# Patient Record
Sex: Male | Born: 1945 | Race: White | Hispanic: No | Marital: Married | Smoking: Former smoker
Health system: Southern US, Community
[De-identification: ages and names within clinical notes are randomized; demographics above are authoritative.]

## PROBLEM LIST (undated history)

## (undated) DIAGNOSIS — I82B12 Acute embolism and thrombosis of left subclavian vein: Secondary | ICD-10-CM

## (undated) DIAGNOSIS — K559 Vascular disorder of intestine, unspecified: Secondary | ICD-10-CM

## (undated) DIAGNOSIS — A809 Acute poliomyelitis, unspecified: Secondary | ICD-10-CM

## (undated) DIAGNOSIS — I10 Essential (primary) hypertension: Secondary | ICD-10-CM

## (undated) DIAGNOSIS — C801 Malignant (primary) neoplasm, unspecified: Secondary | ICD-10-CM

## (undated) DIAGNOSIS — J449 Chronic obstructive pulmonary disease, unspecified: Secondary | ICD-10-CM

## (undated) DIAGNOSIS — E785 Hyperlipidemia, unspecified: Secondary | ICD-10-CM

## (undated) DIAGNOSIS — C3491 Malignant neoplasm of unspecified part of right bronchus or lung: Secondary | ICD-10-CM

## (undated) DIAGNOSIS — H409 Unspecified glaucoma: Secondary | ICD-10-CM

## (undated) DIAGNOSIS — G609 Hereditary and idiopathic neuropathy, unspecified: Secondary | ICD-10-CM

## (undated) DIAGNOSIS — G4730 Sleep apnea, unspecified: Secondary | ICD-10-CM

## (undated) HISTORY — DX: Malignant (primary) neoplasm, unspecified: C80.1

## (undated) HISTORY — DX: Unspecified glaucoma: H40.9

## (undated) HISTORY — PX: LUNG REMOVAL, PARTIAL: SHX233

## (undated) HISTORY — DX: Malignant neoplasm of unspecified part of right bronchus or lung: C34.91

## (undated) HISTORY — DX: Acute embolism and thrombosis of left subclavian vein: I82.B12

## (undated) HISTORY — DX: Essential (primary) hypertension: I10

## (undated) HISTORY — DX: Vascular disorder of intestine, unspecified: K55.9

## (undated) HISTORY — DX: Acute poliomyelitis, unspecified: A80.9

## (undated) HISTORY — PX: SUBCLAVIAN STENT PLACEMENT: SUR1038

## (undated) HISTORY — DX: Hyperlipidemia, unspecified: E78.5

## (undated) HISTORY — DX: Hereditary and idiopathic neuropathy, unspecified: G60.9

## (undated) HISTORY — PX: HERNIA REPAIR: SHX51

## (undated) HISTORY — PX: TONSILLECTOMY: SUR1361

---

## 1998-11-25 ENCOUNTER — Ambulatory Visit (HOSPITAL_COMMUNITY): Admission: RE | Admit: 1998-11-25 | Discharge: 1998-11-25 | Payer: Self-pay | Admitting: Otolaryngology

## 1998-12-03 ENCOUNTER — Ambulatory Visit (HOSPITAL_BASED_OUTPATIENT_CLINIC_OR_DEPARTMENT_OTHER): Admission: RE | Admit: 1998-12-03 | Discharge: 1998-12-03 | Payer: Self-pay | Admitting: Otolaryngology

## 1998-12-22 ENCOUNTER — Ambulatory Visit (HOSPITAL_COMMUNITY): Admission: RE | Admit: 1998-12-22 | Discharge: 1999-01-19 | Payer: Self-pay | Admitting: Otolaryngology

## 1999-01-08 ENCOUNTER — Encounter: Payer: Self-pay | Admitting: Otolaryngology

## 1999-01-09 ENCOUNTER — Ambulatory Visit (HOSPITAL_COMMUNITY): Admission: RE | Admit: 1999-01-09 | Discharge: 1999-01-10 | Payer: Self-pay | Admitting: Otolaryngology

## 2000-05-09 ENCOUNTER — Encounter: Admission: RE | Admit: 2000-05-09 | Discharge: 2000-05-09 | Payer: Self-pay | Admitting: Otolaryngology

## 2000-05-09 ENCOUNTER — Encounter: Payer: Self-pay | Admitting: Otolaryngology

## 2002-01-03 ENCOUNTER — Encounter: Admission: RE | Admit: 2002-01-03 | Discharge: 2002-01-03 | Payer: Self-pay | Admitting: General Surgery

## 2002-01-03 ENCOUNTER — Encounter: Payer: Self-pay | Admitting: General Surgery

## 2002-01-05 ENCOUNTER — Ambulatory Visit (HOSPITAL_BASED_OUTPATIENT_CLINIC_OR_DEPARTMENT_OTHER): Admission: RE | Admit: 2002-01-05 | Discharge: 2002-01-05 | Payer: Self-pay | Admitting: General Surgery

## 2004-03-05 ENCOUNTER — Encounter: Admission: RE | Admit: 2004-03-05 | Discharge: 2004-03-05 | Payer: Self-pay | Admitting: Family Medicine

## 2004-03-18 ENCOUNTER — Ambulatory Visit (HOSPITAL_COMMUNITY)
Admission: RE | Admit: 2004-03-18 | Discharge: 2004-03-18 | Payer: Self-pay | Admitting: Thoracic Surgery (Cardiothoracic Vascular Surgery)

## 2004-03-24 ENCOUNTER — Encounter (INDEPENDENT_AMBULATORY_CARE_PROVIDER_SITE_OTHER): Payer: Self-pay | Admitting: *Deleted

## 2004-03-24 ENCOUNTER — Inpatient Hospital Stay (HOSPITAL_COMMUNITY)
Admission: RE | Admit: 2004-03-24 | Discharge: 2004-04-03 | Payer: Self-pay | Admitting: Thoracic Surgery (Cardiothoracic Vascular Surgery)

## 2004-04-08 ENCOUNTER — Ambulatory Visit: Admission: RE | Admit: 2004-04-08 | Discharge: 2004-06-25 | Payer: Self-pay | Admitting: *Deleted

## 2004-05-04 ENCOUNTER — Encounter
Admission: RE | Admit: 2004-05-04 | Discharge: 2004-05-04 | Payer: Self-pay | Admitting: Thoracic Surgery (Cardiothoracic Vascular Surgery)

## 2004-06-30 ENCOUNTER — Encounter
Admission: RE | Admit: 2004-06-30 | Discharge: 2004-06-30 | Payer: Self-pay | Admitting: Thoracic Surgery (Cardiothoracic Vascular Surgery)

## 2004-07-24 ENCOUNTER — Ambulatory Visit (HOSPITAL_COMMUNITY): Admission: RE | Admit: 2004-07-24 | Discharge: 2004-07-24 | Payer: Self-pay | Admitting: Oncology

## 2004-07-31 ENCOUNTER — Ambulatory Visit: Admission: RE | Admit: 2004-07-31 | Discharge: 2004-07-31 | Payer: Self-pay | Admitting: *Deleted

## 2004-08-12 ENCOUNTER — Encounter (INDEPENDENT_AMBULATORY_CARE_PROVIDER_SITE_OTHER): Payer: Self-pay | Admitting: *Deleted

## 2004-08-12 ENCOUNTER — Ambulatory Visit (HOSPITAL_COMMUNITY)
Admission: RE | Admit: 2004-08-12 | Discharge: 2004-08-12 | Payer: Self-pay | Admitting: Thoracic Surgery (Cardiothoracic Vascular Surgery)

## 2004-10-14 ENCOUNTER — Ambulatory Visit (HOSPITAL_COMMUNITY): Admission: RE | Admit: 2004-10-14 | Discharge: 2004-10-14 | Payer: Self-pay | Admitting: Oncology

## 2004-11-05 ENCOUNTER — Ambulatory Visit: Payer: Self-pay | Admitting: Oncology

## 2004-12-08 ENCOUNTER — Ambulatory Visit (HOSPITAL_COMMUNITY): Admission: RE | Admit: 2004-12-08 | Discharge: 2004-12-08 | Payer: Self-pay | Admitting: Oncology

## 2005-03-01 ENCOUNTER — Ambulatory Visit (HOSPITAL_COMMUNITY): Admission: RE | Admit: 2005-03-01 | Discharge: 2005-03-01 | Payer: Self-pay | Admitting: Oncology

## 2005-04-05 ENCOUNTER — Ambulatory Visit: Payer: Self-pay | Admitting: Oncology

## 2005-04-07 ENCOUNTER — Ambulatory Visit (HOSPITAL_COMMUNITY): Admission: RE | Admit: 2005-04-07 | Discharge: 2005-04-07 | Payer: Self-pay | Admitting: Oncology

## 2005-07-20 ENCOUNTER — Ambulatory Visit (HOSPITAL_COMMUNITY): Admission: RE | Admit: 2005-07-20 | Discharge: 2005-07-20 | Payer: Self-pay | Admitting: Oncology

## 2005-07-23 ENCOUNTER — Ambulatory Visit: Payer: Self-pay | Admitting: Oncology

## 2005-10-08 ENCOUNTER — Encounter
Admission: RE | Admit: 2005-10-08 | Discharge: 2005-10-08 | Payer: Self-pay | Admitting: Thoracic Surgery (Cardiothoracic Vascular Surgery)

## 2005-11-23 ENCOUNTER — Ambulatory Visit (HOSPITAL_COMMUNITY): Admission: RE | Admit: 2005-11-23 | Discharge: 2005-11-23 | Payer: Self-pay | Admitting: Oncology

## 2005-11-26 ENCOUNTER — Ambulatory Visit: Payer: Self-pay | Admitting: Oncology

## 2005-11-30 ENCOUNTER — Ambulatory Visit: Admission: RE | Admit: 2005-11-30 | Discharge: 2005-11-30 | Payer: Self-pay | Admitting: Oncology

## 2005-12-28 ENCOUNTER — Ambulatory Visit (HOSPITAL_COMMUNITY): Admission: RE | Admit: 2005-12-28 | Discharge: 2005-12-28 | Payer: Self-pay | Admitting: Oncology

## 2005-12-30 ENCOUNTER — Encounter: Admission: RE | Admit: 2005-12-30 | Discharge: 2006-01-13 | Payer: Self-pay | Admitting: Oncology

## 2006-01-04 ENCOUNTER — Inpatient Hospital Stay (HOSPITAL_COMMUNITY): Admission: EM | Admit: 2006-01-04 | Discharge: 2006-01-06 | Payer: Self-pay | Admitting: Oncology

## 2006-01-11 ENCOUNTER — Encounter
Admission: RE | Admit: 2006-01-11 | Discharge: 2006-01-11 | Payer: Self-pay | Admitting: Thoracic Surgery (Cardiothoracic Vascular Surgery)

## 2006-01-11 ENCOUNTER — Ambulatory Visit: Payer: Self-pay | Admitting: Oncology

## 2006-02-09 ENCOUNTER — Encounter: Admission: RE | Admit: 2006-02-09 | Discharge: 2006-02-09 | Payer: Self-pay | Admitting: Interventional Radiology

## 2006-02-28 ENCOUNTER — Ambulatory Visit: Payer: Self-pay | Admitting: Oncology

## 2006-03-23 ENCOUNTER — Ambulatory Visit (HOSPITAL_COMMUNITY): Admission: RE | Admit: 2006-03-23 | Discharge: 2006-03-23 | Payer: Self-pay | Admitting: Oncology

## 2006-04-11 LAB — PROTIME-INR
INR: 1.4 — ABNORMAL LOW (ref 2.00–3.50)
Protime: 14.7 Seconds — ABNORMAL HIGH (ref 10.6–13.4)

## 2006-04-15 ENCOUNTER — Ambulatory Visit: Payer: Self-pay | Admitting: Oncology

## 2006-04-18 LAB — PROTIME-INR
INR: 1.7 — ABNORMAL LOW (ref 2.00–3.50)
Protime: 15.9 Seconds — ABNORMAL HIGH (ref 10.6–13.4)

## 2006-04-25 LAB — PROTIME-INR: INR: 1.5 — ABNORMAL LOW (ref 2.00–3.50)

## 2006-05-03 LAB — PROTIME-INR: INR: 2.5 (ref 2.00–3.50)

## 2006-05-17 LAB — PROTIME-INR: Protime: 23.4 Seconds (ref 10.6–13.4)

## 2006-05-25 ENCOUNTER — Ambulatory Visit: Payer: Self-pay | Admitting: Oncology

## 2006-05-25 LAB — PROTIME-INR

## 2006-05-30 LAB — PROTIME-INR

## 2006-06-06 LAB — PROTIME-INR
INR: 1.6 (ref 2.00–3.50)
Protime: 15.4 Seconds (ref 10.6–13.4)

## 2006-06-13 LAB — PROTIME-INR
INR: 2.2 (ref 2.00–3.50)
Protime: 18.1 Seconds — ABNORMAL HIGH (ref 10.6–13.4)

## 2006-06-16 ENCOUNTER — Encounter
Admission: RE | Admit: 2006-06-16 | Discharge: 2006-06-16 | Payer: Self-pay | Admitting: Thoracic Surgery (Cardiothoracic Vascular Surgery)

## 2006-07-06 ENCOUNTER — Ambulatory Visit: Payer: Self-pay | Admitting: Oncology

## 2006-07-06 LAB — CBC WITH DIFFERENTIAL/PLATELET
BASO%: 0.6 % (ref 0.0–2.0)
Basophils Absolute: 0 10*3/uL (ref 0.0–0.1)
Eosinophils Absolute: 0.1 10*3/uL (ref 0.0–0.5)
HCT: 43.6 % (ref 38.7–49.9)
HGB: 14.7 g/dL (ref 13.0–17.1)
LYMPH%: 22.3 % (ref 14.0–48.0)
MCHC: 33.8 g/dL (ref 32.0–35.9)
MONO#: 0.7 10*3/uL (ref 0.1–0.9)
NEUT%: 64.9 % (ref 40.0–75.0)
Platelets: 277 10*3/uL (ref 145–400)
WBC: 6.7 10*3/uL (ref 4.0–10.0)

## 2006-07-06 LAB — COMPREHENSIVE METABOLIC PANEL
BUN: 13 mg/dL (ref 6–23)
CO2: 25 mEq/L (ref 19–32)
Calcium: 9 mg/dL (ref 8.4–10.5)
Chloride: 100 mEq/L (ref 96–112)
Creatinine, Ser: 0.78 mg/dL (ref 0.40–1.50)
Glucose, Bld: 93 mg/dL (ref 70–99)
Total Bilirubin: 0.4 mg/dL (ref 0.3–1.2)

## 2006-07-06 LAB — CEA: CEA: 0.8 ng/mL (ref 0.0–5.0)

## 2006-07-14 LAB — PROTIME-INR
INR: 1.1 (ref 2.00–3.50)
Protime: 13.1 Seconds (ref 10.6–13.4)

## 2006-07-15 ENCOUNTER — Ambulatory Visit (HOSPITAL_COMMUNITY): Admission: RE | Admit: 2006-07-15 | Discharge: 2006-07-15 | Payer: Self-pay | Admitting: Oncology

## 2006-07-18 LAB — PROTIME-INR: Protime: 13 Seconds (ref 10.6–13.4)

## 2006-07-20 LAB — CBC WITH DIFFERENTIAL/PLATELET
Eosinophils Absolute: 0.2 10*3/uL (ref 0.0–0.5)
HCT: 39.9 % (ref 38.7–49.9)
LYMPH%: 23.7 % (ref 14.0–48.0)
MCV: 91.3 fL (ref 81.6–98.0)
MONO#: 0.7 10*3/uL (ref 0.1–0.9)
MONO%: 9.7 % (ref 0.0–13.0)
NEUT#: 4.3 10*3/uL (ref 1.5–6.5)
NEUT%: 63.3 % (ref 40.0–75.0)
Platelets: 239 10*3/uL (ref 145–400)
RBC: 4.37 10*6/uL (ref 4.20–5.71)

## 2006-07-20 LAB — PROTIME-INR: Protime: 13.4 Seconds (ref 10.6–13.4)

## 2006-07-22 LAB — PROTIME-INR
INR: 1.3 — ABNORMAL LOW (ref 2.00–3.50)
Protime: 14.3 Seconds — ABNORMAL HIGH (ref 10.6–13.4)

## 2006-07-25 LAB — PROTIME-INR
INR: 1.1 — ABNORMAL LOW (ref 2.00–3.50)
Protime: 12.8 Seconds (ref 10.6–13.4)

## 2006-08-01 LAB — PROTIME-INR

## 2006-08-04 LAB — PROTIME-INR

## 2006-08-10 LAB — PROTIME-INR: INR: 2 (ref 2.00–3.50)

## 2006-08-17 ENCOUNTER — Ambulatory Visit: Payer: Self-pay | Admitting: Oncology

## 2006-08-17 ENCOUNTER — Ambulatory Visit (HOSPITAL_COMMUNITY): Admission: RE | Admit: 2006-08-17 | Discharge: 2006-08-17 | Payer: Self-pay | Admitting: Oncology

## 2006-08-17 LAB — PROTIME-INR

## 2006-08-18 ENCOUNTER — Ambulatory Visit (HOSPITAL_COMMUNITY): Admission: RE | Admit: 2006-08-18 | Discharge: 2006-08-18 | Payer: Self-pay | Admitting: Oncology

## 2006-08-20 ENCOUNTER — Emergency Department (HOSPITAL_COMMUNITY): Admission: EM | Admit: 2006-08-20 | Discharge: 2006-08-20 | Payer: Self-pay | Admitting: Emergency Medicine

## 2006-08-24 LAB — CBC WITH DIFFERENTIAL/PLATELET
BASO%: 0.4 % (ref 0.0–2.0)
EOS%: 2.9 % (ref 0.0–7.0)
Eosinophils Absolute: 0.2 10*3/uL (ref 0.0–0.5)
LYMPH%: 22.7 % (ref 14.0–48.0)
MCH: 31 pg (ref 28.0–33.4)
MCHC: 34.8 g/dL (ref 32.0–35.9)
MCV: 89.1 fL (ref 81.6–98.0)
MONO%: 19.3 % — ABNORMAL HIGH (ref 0.0–13.0)
Platelets: 266 10*3/uL (ref 145–400)
RBC: 4.91 10*6/uL (ref 4.20–5.71)
RDW: 12 % (ref 11.2–14.6)

## 2006-08-24 LAB — PROTIME-INR
INR: 1.2 — ABNORMAL LOW (ref 2.00–3.50)
Protime: 14.4 Seconds — ABNORMAL HIGH (ref 10.6–13.4)

## 2006-08-26 LAB — CBC WITH DIFFERENTIAL/PLATELET
BASO%: 0.4 % (ref 0.0–2.0)
Eosinophils Absolute: 0.1 10*3/uL (ref 0.0–0.5)
MCHC: 34.2 g/dL (ref 32.0–35.9)
MONO#: 1.1 10*3/uL — ABNORMAL HIGH (ref 0.1–0.9)
NEUT#: 4.7 10*3/uL (ref 1.5–6.5)
RBC: 4.31 10*6/uL (ref 4.20–5.71)
RDW: 13.8 % (ref 11.2–14.6)
WBC: 7.1 10*3/uL (ref 4.0–10.0)
lymph#: 1.2 10*3/uL (ref 0.9–3.3)

## 2006-08-26 LAB — PROTIME-INR
INR: 1.3 — ABNORMAL LOW (ref 2.00–3.50)
Protime: 15.6 Seconds — ABNORMAL HIGH (ref 10.6–13.4)

## 2006-08-29 LAB — CBC WITH DIFFERENTIAL/PLATELET
BASO%: 0.9 % (ref 0.0–2.0)
HCT: 41.7 % (ref 38.7–49.9)
LYMPH%: 16.5 % (ref 14.0–48.0)
MCH: 31.3 pg (ref 28.0–33.4)
MCHC: 34.3 g/dL (ref 32.0–35.9)
MCV: 91.3 fL (ref 81.6–98.0)
MONO#: 0.8 10*3/uL (ref 0.1–0.9)
MONO%: 9.3 % (ref 0.0–13.0)
NEUT%: 70.9 % (ref 40.0–75.0)
Platelets: 477 10*3/uL — ABNORMAL HIGH (ref 145–400)
WBC: 8.4 10*3/uL (ref 4.0–10.0)

## 2006-09-01 LAB — CULTURE, BLOOD (SINGLE)

## 2006-09-02 LAB — CBC WITH DIFFERENTIAL/PLATELET
BASO%: 0.8 % (ref 0.0–2.0)
EOS%: 3.5 % (ref 0.0–7.0)
HCT: 40.3 % (ref 38.7–49.9)
LYMPH%: 17.6 % (ref 14.0–48.0)
MCH: 31.4 pg (ref 28.0–33.4)
MCHC: 34.2 g/dL (ref 32.0–35.9)
NEUT%: 68.4 % (ref 40.0–75.0)
Platelets: 279 10*3/uL (ref 145–400)
lymph#: 1.6 10*3/uL (ref 0.9–3.3)

## 2006-09-02 LAB — PROTIME-INR: Protime: 20.4 Seconds — ABNORMAL HIGH (ref 10.6–13.4)

## 2006-09-07 LAB — CBC WITH DIFFERENTIAL/PLATELET
Basophils Absolute: 0.1 10*3/uL (ref 0.0–0.1)
Eosinophils Absolute: 0.3 10*3/uL (ref 0.0–0.5)
HGB: 13.7 g/dL (ref 13.0–17.1)
LYMPH%: 25.7 % (ref 14.0–48.0)
MCV: 90.3 fL (ref 81.6–98.0)
MONO%: 9.7 % (ref 0.0–13.0)
NEUT#: 4.7 10*3/uL (ref 1.5–6.5)
Platelets: 371 10*3/uL (ref 145–400)
RBC: 4.42 10*6/uL (ref 4.20–5.71)

## 2006-09-14 LAB — PROTHROMBIN TIME
INR: 4 — ABNORMAL HIGH (ref 0.0–1.5)
Prothrombin Time: 42.1 seconds — ABNORMAL HIGH (ref 11.6–15.2)

## 2006-09-14 LAB — PROTIME-INR

## 2006-09-19 ENCOUNTER — Ambulatory Visit: Payer: Self-pay | Admitting: Oncology

## 2006-09-19 LAB — PROTIME-INR

## 2006-09-30 ENCOUNTER — Ambulatory Visit (HOSPITAL_COMMUNITY): Admission: RE | Admit: 2006-09-30 | Discharge: 2006-09-30 | Payer: Self-pay | Admitting: Oncology

## 2006-10-20 ENCOUNTER — Encounter: Admission: RE | Admit: 2006-10-20 | Discharge: 2006-11-23 | Payer: Self-pay | Admitting: Family Medicine

## 2006-10-21 LAB — PROTIME-INR
INR: 2.4 (ref 2.00–3.50)
Protime: 28.8 Seconds — ABNORMAL HIGH (ref 10.6–13.4)

## 2006-10-28 ENCOUNTER — Ambulatory Visit (HOSPITAL_COMMUNITY): Admission: RE | Admit: 2006-10-28 | Discharge: 2006-10-28 | Payer: Self-pay | Admitting: Oncology

## 2006-11-04 ENCOUNTER — Ambulatory Visit: Payer: Self-pay | Admitting: Oncology

## 2006-11-08 LAB — PROTIME-INR: Protime: 34.8 Seconds — ABNORMAL HIGH (ref 10.6–13.4)

## 2006-11-15 LAB — PROTIME-INR
INR: 3.6 — ABNORMAL HIGH (ref 2.00–3.50)
Protime: 43.2 Seconds — ABNORMAL HIGH (ref 10.6–13.4)

## 2006-11-22 LAB — PROTIME-INR
INR: 1.9 — ABNORMAL LOW (ref 2.00–3.50)
Protime: 22.8 Seconds — ABNORMAL HIGH (ref 10.6–13.4)

## 2006-12-08 LAB — PROTIME-INR

## 2006-12-12 ENCOUNTER — Ambulatory Visit: Payer: Self-pay | Admitting: Oncology

## 2006-12-15 LAB — PROTIME-INR
INR: 2.3 (ref 2.00–3.50)
Protime: 27.6 Seconds — ABNORMAL HIGH (ref 10.6–13.4)

## 2006-12-29 LAB — PROTIME-INR: INR: 2.2 (ref 2.00–3.50)

## 2007-01-19 LAB — PROTIME-INR
INR: 2.5 (ref 2.00–3.50)
Protime: 30 Seconds — ABNORMAL HIGH (ref 10.6–13.4)

## 2007-01-24 ENCOUNTER — Ambulatory Visit: Payer: Self-pay | Admitting: Oncology

## 2007-01-26 LAB — PROTIME-INR
INR: 2.5 (ref 2.00–3.50)
Protime: 30 Seconds — ABNORMAL HIGH (ref 10.6–13.4)

## 2007-02-09 LAB — PROTIME-INR

## 2007-02-16 LAB — PROTIME-INR
INR: 2.5 (ref 2.00–3.50)
Protime: 30 Seconds — ABNORMAL HIGH (ref 10.6–13.4)

## 2007-02-23 LAB — PROTIME-INR
INR: 1.9 — ABNORMAL LOW (ref 2.00–3.50)
Protime: 22.8 Seconds — ABNORMAL HIGH (ref 10.6–13.4)

## 2007-03-07 ENCOUNTER — Ambulatory Visit: Payer: Self-pay | Admitting: Oncology

## 2007-03-09 LAB — PROTIME-INR: INR: 1.7 — ABNORMAL LOW (ref 2.00–3.50)

## 2007-03-16 LAB — PROTIME-INR: Protime: 26.4 Seconds — ABNORMAL HIGH (ref 10.6–13.4)

## 2007-03-30 LAB — PROTIME-INR: Protime: 28.8 Seconds — ABNORMAL HIGH (ref 10.6–13.4)

## 2007-04-06 LAB — PROTIME-INR
INR: 2.9 (ref 2.00–3.50)
Protime: 34.8 Seconds — ABNORMAL HIGH (ref 10.6–13.4)

## 2007-04-13 LAB — PROTIME-INR: INR: 2.8 (ref 2.00–3.50)

## 2007-04-18 ENCOUNTER — Ambulatory Visit: Payer: Self-pay | Admitting: Oncology

## 2007-04-20 LAB — PROTIME-INR

## 2007-04-27 LAB — PROTIME-INR: Protime: 26.4 Seconds — ABNORMAL HIGH (ref 10.6–13.4)

## 2007-05-04 LAB — PROTIME-INR
INR: 2.6 (ref 2.00–3.50)
Protime: 31.2 Seconds — ABNORMAL HIGH (ref 10.6–13.4)

## 2007-05-18 LAB — PROTIME-INR: INR: 3.2 (ref 2.00–3.50)

## 2007-05-25 LAB — CBC WITH DIFFERENTIAL/PLATELET
Basophils Absolute: 0 10*3/uL (ref 0.0–0.1)
EOS%: 2.4 % (ref 0.0–7.0)
Eosinophils Absolute: 0.2 10*3/uL (ref 0.0–0.5)
HCT: 43 % (ref 38.7–49.9)
HGB: 14.9 g/dL (ref 13.0–17.1)
MONO#: 0.9 10*3/uL (ref 0.1–0.9)
NEUT#: 5 10*3/uL (ref 1.5–6.5)
RDW: 13.4 % (ref 11.2–14.6)
WBC: 8 10*3/uL (ref 4.0–10.0)
lymph#: 1.9 10*3/uL (ref 0.9–3.3)

## 2007-05-25 LAB — PROTIME-INR
INR: 4.2 — ABNORMAL HIGH (ref 2.00–3.50)
Protime: 50.4 Seconds — ABNORMAL HIGH (ref 10.6–13.4)

## 2007-05-25 LAB — COMPREHENSIVE METABOLIC PANEL
AST: 24 U/L (ref 0–37)
Alkaline Phosphatase: 90 U/L (ref 39–117)
BUN: 14 mg/dL (ref 6–23)
Glucose, Bld: 94 mg/dL (ref 70–99)
Potassium: 3.9 mEq/L (ref 3.5–5.3)
Sodium: 136 mEq/L (ref 135–145)
Total Bilirubin: 0.4 mg/dL (ref 0.3–1.2)
Total Protein: 7.2 g/dL (ref 6.0–8.3)

## 2007-05-25 LAB — CEA: CEA: 1 ng/mL (ref 0.0–5.0)

## 2007-05-26 ENCOUNTER — Ambulatory Visit (HOSPITAL_COMMUNITY): Admission: RE | Admit: 2007-05-26 | Discharge: 2007-05-26 | Payer: Self-pay | Admitting: Oncology

## 2007-05-27 ENCOUNTER — Ambulatory Visit (HOSPITAL_COMMUNITY): Admission: RE | Admit: 2007-05-27 | Discharge: 2007-05-27 | Payer: Self-pay | Admitting: Oncology

## 2007-05-30 ENCOUNTER — Ambulatory Visit: Payer: Self-pay | Admitting: Thoracic Surgery (Cardiothoracic Vascular Surgery)

## 2007-06-01 LAB — PROTIME-INR: Protime: 22.8 Seconds — ABNORMAL HIGH (ref 10.6–13.4)

## 2007-06-08 ENCOUNTER — Ambulatory Visit: Payer: Self-pay | Admitting: Oncology

## 2007-06-08 LAB — FECAL OCCULT BLOOD, GUAIAC: Occult Blood: NEGATIVE

## 2007-06-15 LAB — PROTIME-INR: Protime: 36 Seconds — ABNORMAL HIGH (ref 10.6–13.4)

## 2007-06-29 LAB — PROTIME-INR: Protime: 39.6 Seconds — ABNORMAL HIGH (ref 10.6–13.4)

## 2007-07-13 LAB — PROTIME-INR
INR: 3.6 — ABNORMAL HIGH (ref 2.00–3.50)
Protime: 43.2 Seconds — ABNORMAL HIGH (ref 10.6–13.4)

## 2007-07-25 ENCOUNTER — Ambulatory Visit: Payer: Self-pay | Admitting: Oncology

## 2007-07-27 LAB — PROTIME-INR
INR: 3.6 — ABNORMAL HIGH (ref 2.00–3.50)
Protime: 43.2 Seconds — ABNORMAL HIGH (ref 10.6–13.4)

## 2007-08-03 IMAGING — CR DG CHEST 2V
2 series · 2 of 2 positions shown · non-contrast
Comparison: CT dated 07/20/05 and chest x-ray dated 03/01/05.

CLINICAL DATA: Follow up lung carcinoma.  
 TWO VIEW CHEST:

[w chest pa]
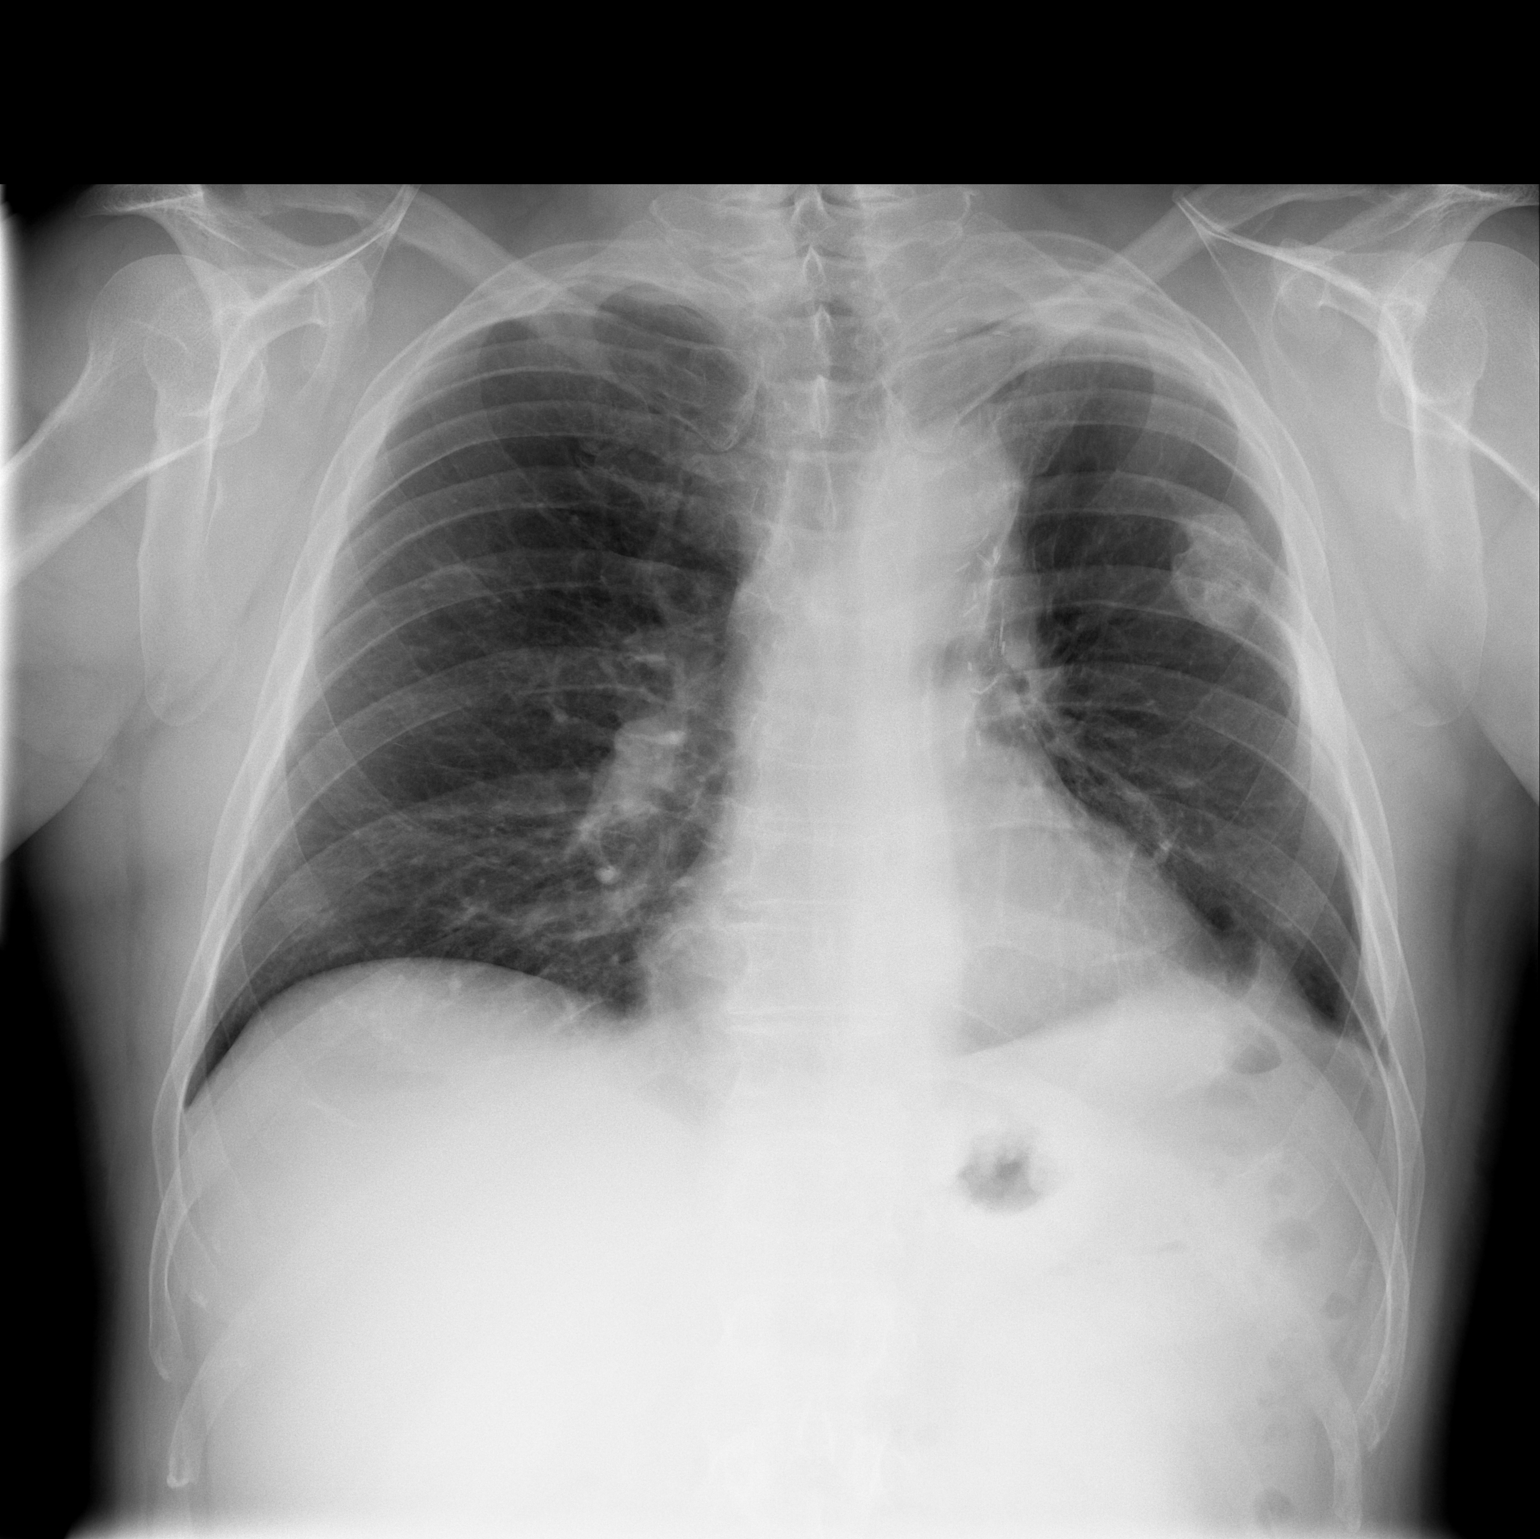

[w chest lat]
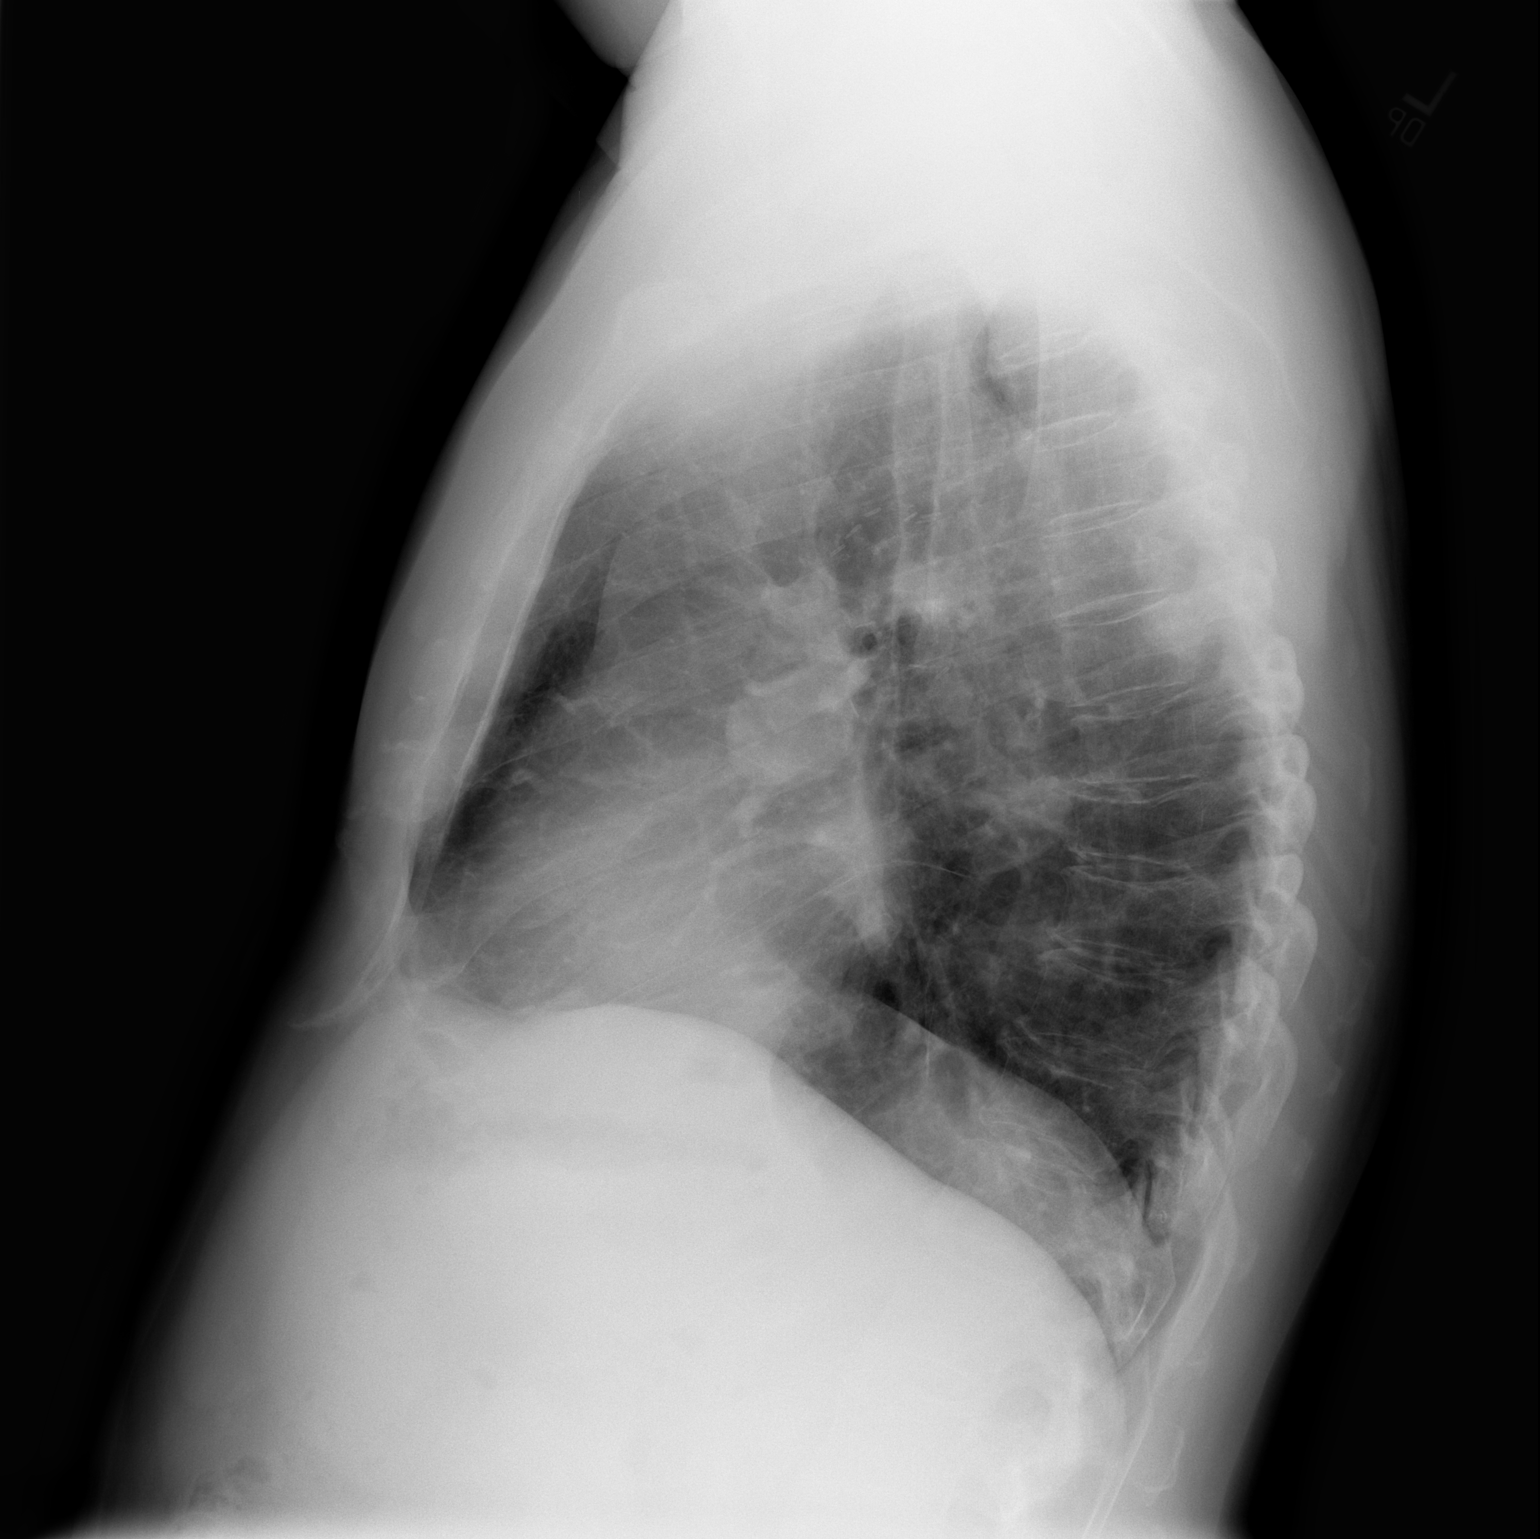

[2 of 2 positions shown; findings below may reference images not displayed]

Cardiomediastinal silhouette is unchanged.  Surgical clips in the region of the left hilum again noted.  Post operative changes and scarring in the left lung and left chest wall are noted.
IMPRESSION: No evidence of acute cardiopulmonary disease.

## 2007-08-10 LAB — PROTIME-INR: INR: 3.2 (ref 2.00–3.50)

## 2007-08-24 LAB — PROTIME-INR
INR: 2.6 (ref 2.00–3.50)
Protime: 31.2 Seconds — ABNORMAL HIGH (ref 10.6–13.4)

## 2007-09-07 LAB — PROTIME-INR: INR: 3.4 (ref 2.00–3.50)

## 2007-10-03 ENCOUNTER — Ambulatory Visit: Payer: Self-pay | Admitting: Oncology

## 2007-10-05 LAB — PROTIME-INR

## 2007-10-19 LAB — PROTIME-INR

## 2007-11-06 IMAGING — CR DG CHEST 2V
2 series · 2 of 2 positions shown · non-contrast
Comparison: none

CLINICAL DATA: Lung lesion. Left subclavian venous stent

[w chest pa]
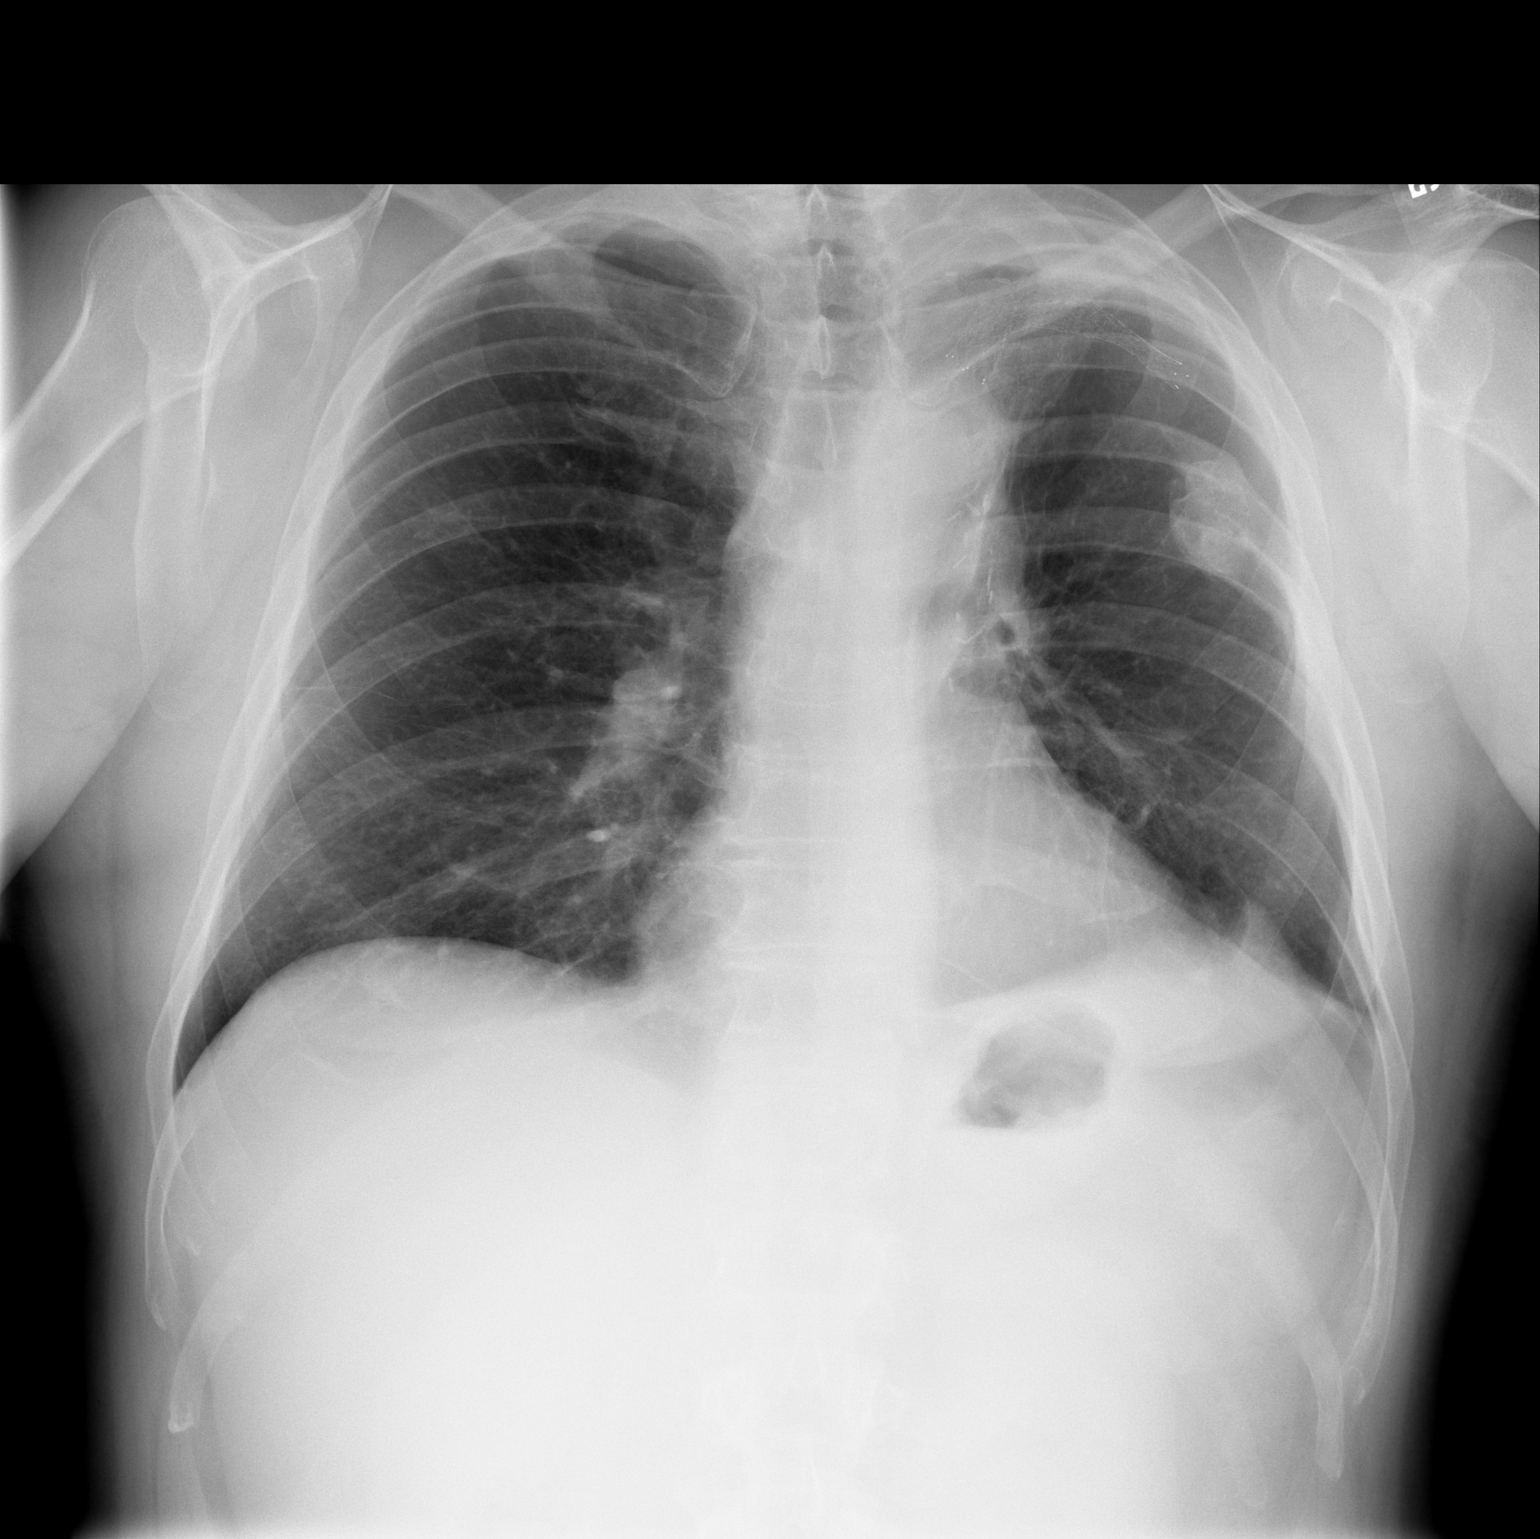

[w chest lat]
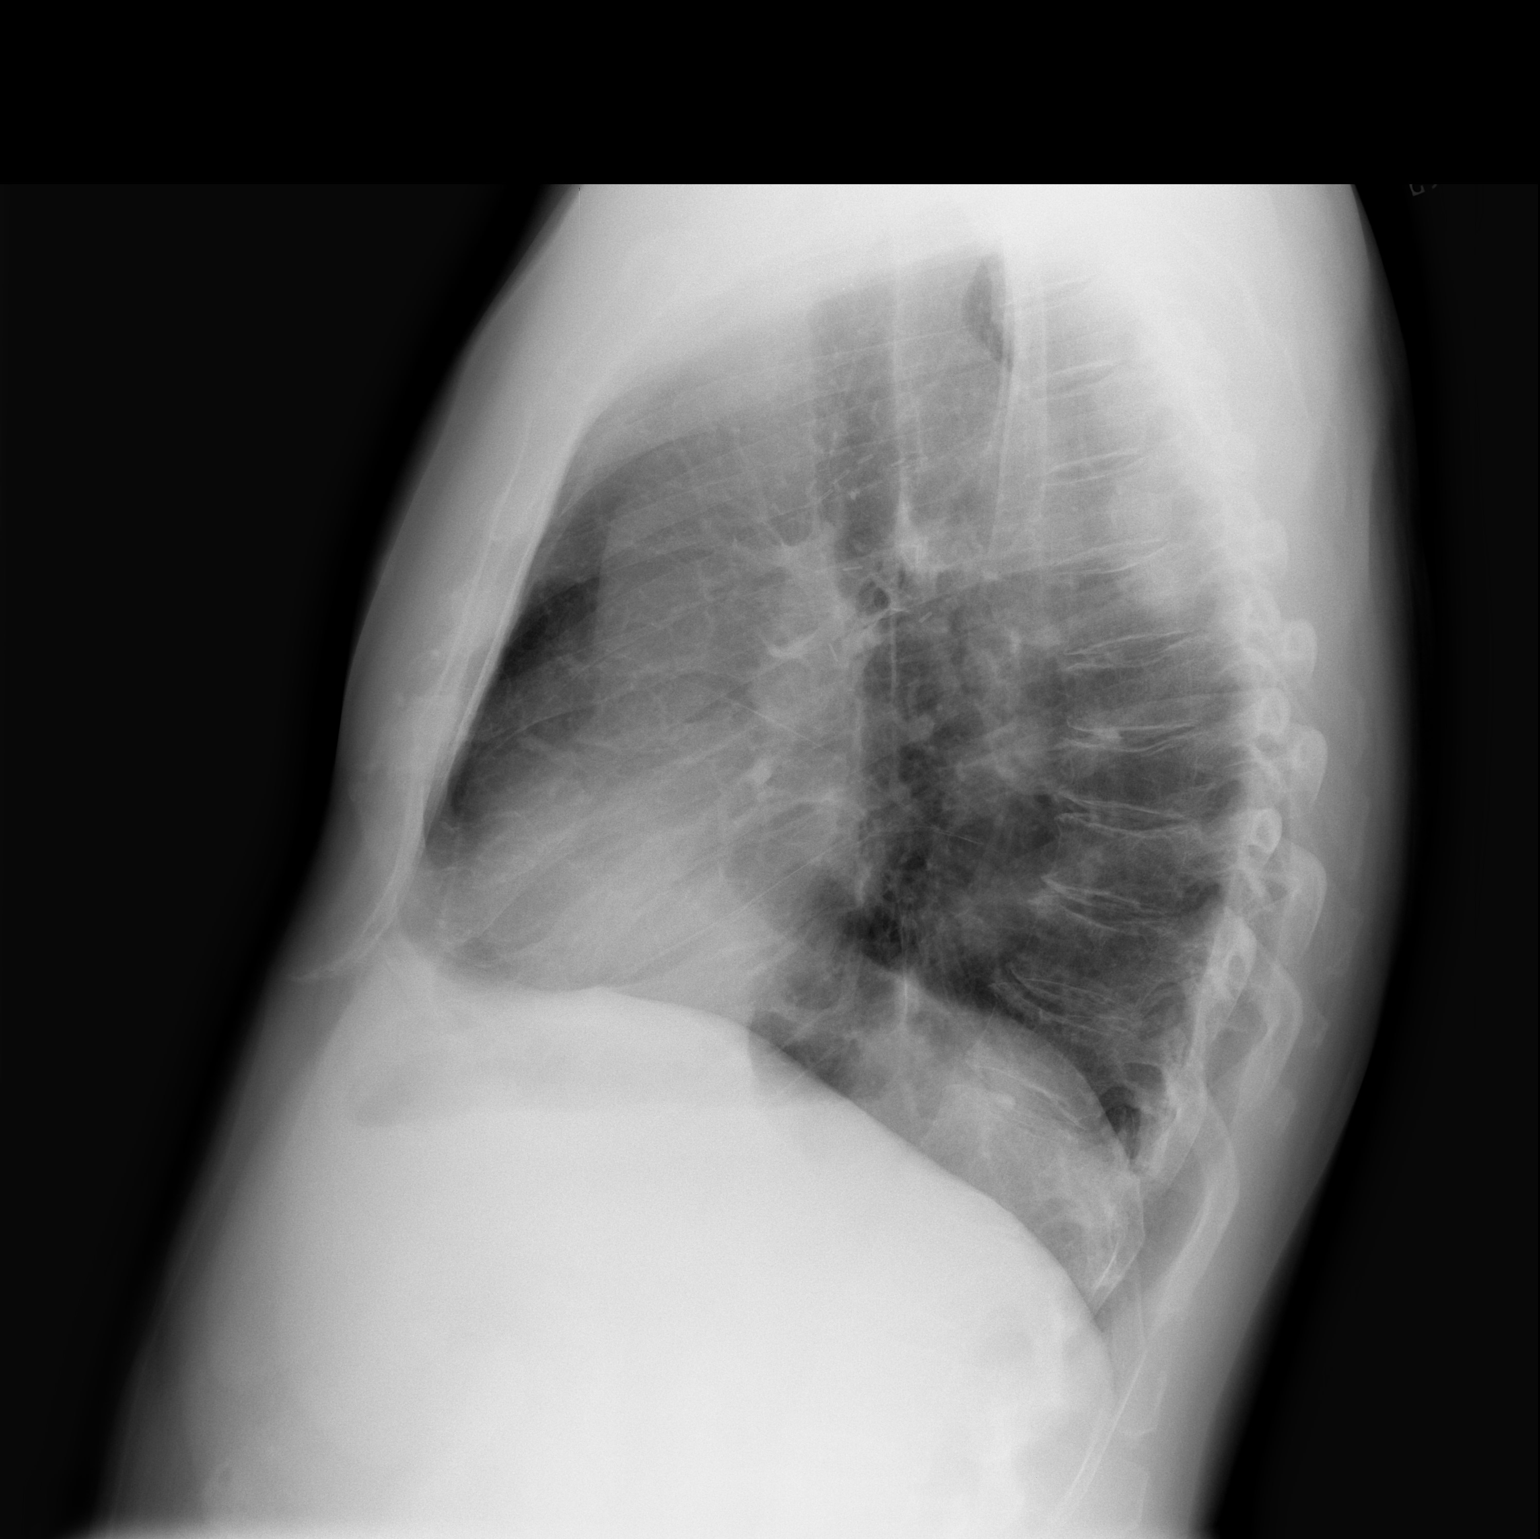

[2 of 2 positions shown; findings below may reference images not displayed]

Chest 2 view:

Comparison 10/08/2005. Left subclavian vein stent stable in position since
01/05/2006. Changes of left thoracotomy with vascular clips at the left hilum.
Right lung remains clear. Heart is normal. Asymmetric pleural-parenchymal
thickening in the apices, left greater than right, as before.
IMPRESSION: 1. Stable postoperative changes as above.
2. Negative for acute disease

## 2007-11-14 LAB — PROTIME-INR: Protime: 38.4 Seconds — ABNORMAL HIGH (ref 10.6–13.4)

## 2007-11-21 ENCOUNTER — Ambulatory Visit: Payer: Self-pay | Admitting: Oncology

## 2007-11-24 ENCOUNTER — Ambulatory Visit (HOSPITAL_COMMUNITY): Admission: RE | Admit: 2007-11-24 | Discharge: 2007-11-24 | Payer: Self-pay | Admitting: Oncology

## 2007-11-28 ENCOUNTER — Ambulatory Visit: Payer: Self-pay | Admitting: Thoracic Surgery (Cardiothoracic Vascular Surgery)

## 2007-12-22 ENCOUNTER — Encounter: Payer: Self-pay | Admitting: Family Medicine

## 2007-12-22 LAB — CBC WITH DIFFERENTIAL/PLATELET
BASO%: 0.3 % (ref 0.0–2.0)
Basophils Absolute: 0 10*3/uL (ref 0.0–0.1)
EOS%: 0.9 % (ref 0.0–7.0)
HCT: 42.6 % (ref 38.7–49.9)
HGB: 14.8 g/dL (ref 13.0–17.1)
MCH: 31.4 pg (ref 28.0–33.4)
MCHC: 34.6 g/dL (ref 32.0–35.9)
MCV: 90.7 fL (ref 81.6–98.0)
MONO%: 9.8 % (ref 0.0–13.0)
NEUT%: 74.1 % (ref 40.0–75.0)
RDW: 13 % (ref 11.2–14.6)

## 2007-12-22 LAB — COMPREHENSIVE METABOLIC PANEL
ALT: 45 U/L (ref 0–53)
AST: 29 U/L (ref 0–37)
Alkaline Phosphatase: 96 U/L (ref 39–117)
BUN: 19 mg/dL (ref 6–23)
Creatinine, Ser: 0.85 mg/dL (ref 0.40–1.50)

## 2007-12-22 LAB — PROTIME-INR: INR: 1.6 — ABNORMAL LOW (ref 2.00–3.50)

## 2008-01-04 LAB — PROTIME-INR

## 2008-01-09 ENCOUNTER — Ambulatory Visit: Payer: Self-pay | Admitting: Oncology

## 2008-01-11 LAB — PROTHROMBIN TIME
INR: 3.8 — ABNORMAL HIGH (ref 0.0–1.5)
Prothrombin Time: 39 seconds — ABNORMAL HIGH (ref 11.6–15.2)

## 2008-01-11 LAB — PROTIME-INR

## 2008-01-18 LAB — PROTIME-INR
INR: 4.3 — ABNORMAL HIGH (ref 2.00–3.50)
Protime: 51.6 Seconds — ABNORMAL HIGH (ref 10.6–13.4)

## 2008-01-25 LAB — PROTIME-INR: INR: 3 (ref 2.00–3.50)

## 2008-02-01 LAB — PROTIME-INR: Protime: 40.8 Seconds — ABNORMAL HIGH (ref 10.6–13.4)

## 2008-02-15 LAB — PROTIME-INR
INR: 3.2 (ref 2.00–3.50)
Protime: 38.4 Seconds — ABNORMAL HIGH (ref 10.6–13.4)

## 2008-02-27 ENCOUNTER — Ambulatory Visit: Payer: Self-pay | Admitting: Oncology

## 2008-03-21 LAB — PROTIME-INR
INR: 2.5 (ref 2.00–3.50)
Protime: 30 Seconds — ABNORMAL HIGH (ref 10.6–13.4)

## 2008-03-28 LAB — PROTIME-INR: Protime: 18 Seconds — ABNORMAL HIGH (ref 10.6–13.4)

## 2008-04-09 ENCOUNTER — Ambulatory Visit: Payer: Self-pay | Admitting: Oncology

## 2008-04-11 LAB — PROTIME-INR: INR: 3.6 — ABNORMAL HIGH (ref 2.00–3.50)

## 2008-05-21 ENCOUNTER — Ambulatory Visit: Payer: Self-pay | Admitting: Oncology

## 2008-05-23 LAB — PROTIME-INR: INR: 2.7 (ref 2.00–3.50)

## 2008-06-06 LAB — COMPREHENSIVE METABOLIC PANEL
ALT: 37 U/L (ref 0–53)
AST: 27 U/L (ref 0–37)
Albumin: 4.5 g/dL (ref 3.5–5.2)
Calcium: 9.1 mg/dL (ref 8.4–10.5)
Chloride: 99 mEq/L (ref 96–112)
Potassium: 4.2 mEq/L (ref 3.5–5.3)
Total Protein: 7 g/dL (ref 6.0–8.3)

## 2008-06-06 LAB — PROTIME-INR: INR: 2.9 (ref 2.00–3.50)

## 2008-06-06 LAB — CBC WITH DIFFERENTIAL/PLATELET
Basophils Absolute: 0 10*3/uL (ref 0.0–0.1)
Eosinophils Absolute: 0.1 10*3/uL (ref 0.0–0.5)
HCT: 43 % (ref 38.7–49.9)
HGB: 14.9 g/dL (ref 13.0–17.1)
MCV: 91.2 fL (ref 81.6–98.0)
NEUT#: 3.4 10*3/uL (ref 1.5–6.5)
RDW: 14.1 % (ref 11.2–14.6)
lymph#: 1.9 10*3/uL (ref 0.9–3.3)

## 2008-06-12 IMAGING — RF IR INTRO INTRAVASC STENT 1ST VESSEL
12 of 16 series · 12 of 16 positions shown · non-contrast
Comparison: 08/17/06.

CLINICAL DATA: 60 year old male with recurrent symptomatic left upper extremity swelling and a known previous occlusion of a left subclavian venous stent.
1.  ULTRASOUND GUIDANCE FOR VASCULAR ACCESS:
2.  LEFT UPPER EXTREMITY VENOGRAM:
3.  LEFT SUBCLAVIAN INTRAVASCULAR STENT INSERTION:

[Series 2: run · 1 of 6 slices shown (1 of 12)]
[im 1/6]
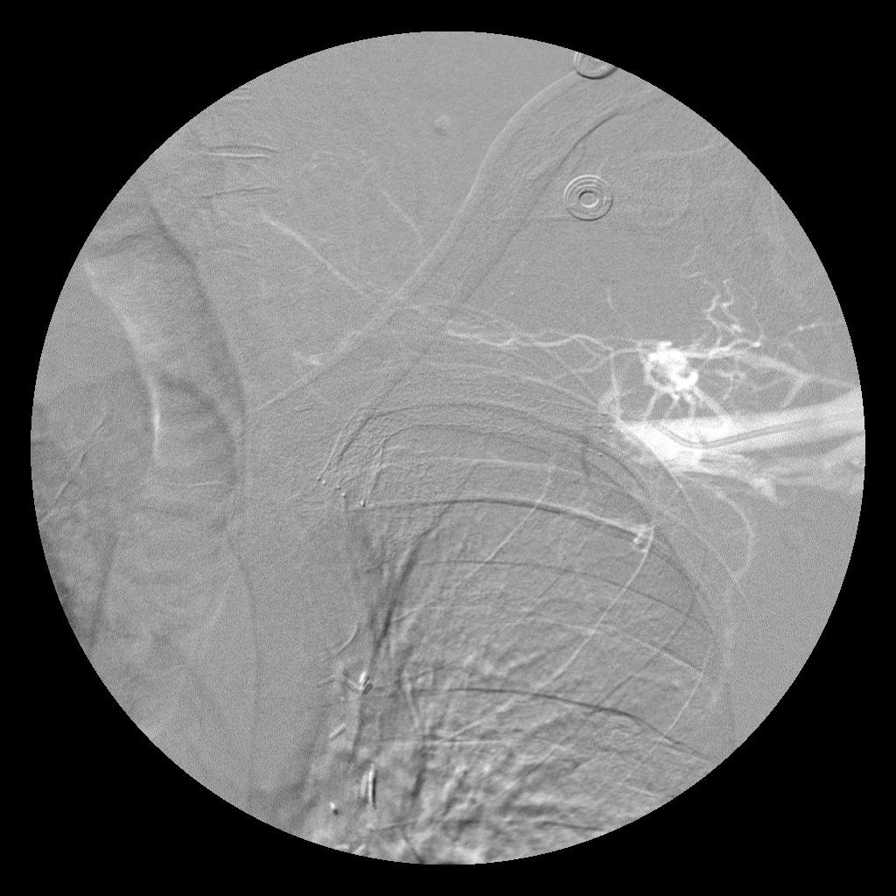

[Series 5: run · 1 of 1 slices shown (2 of 12)]
[im 1/1]
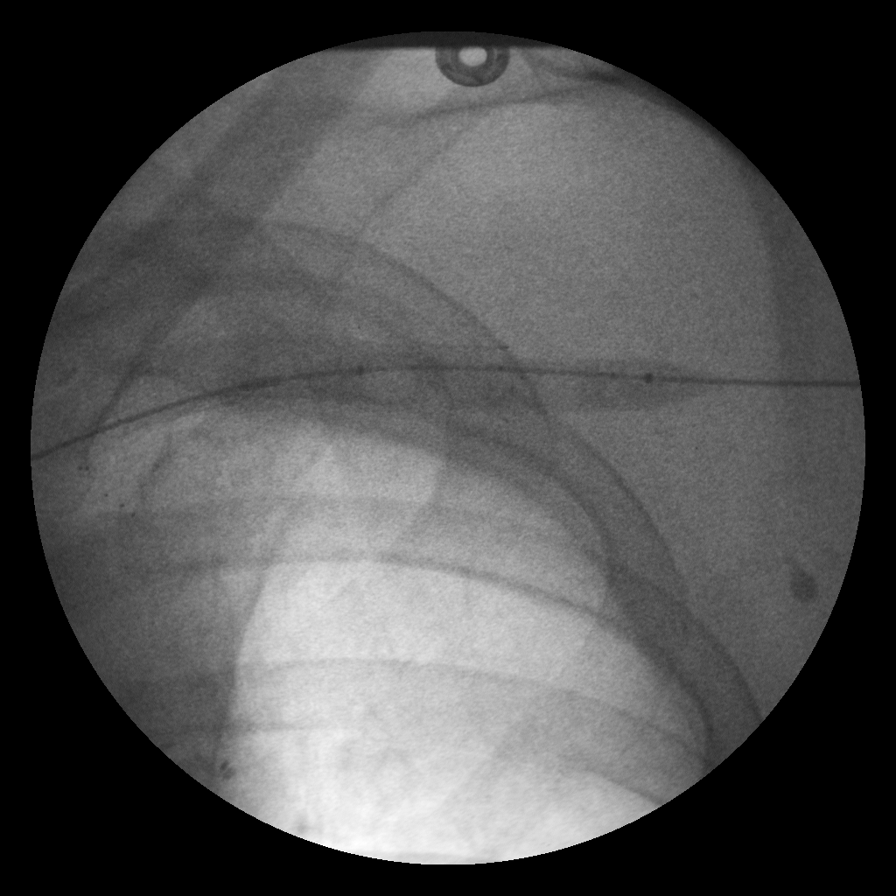

[Series 6: run · 1 of 3 slices shown (3 of 12)]
[im 1/3]
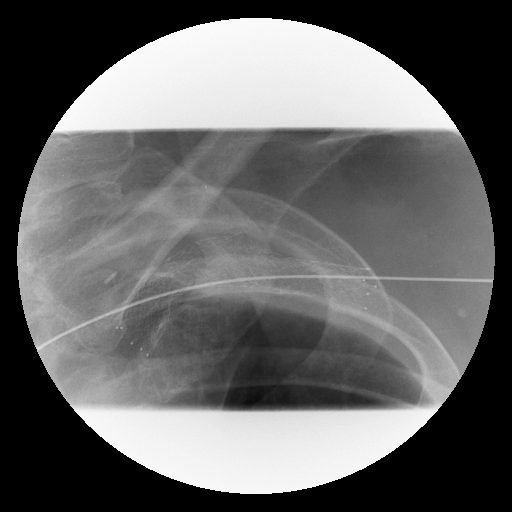

[Series 7: run · 1 of 1 slices shown (4 of 12)]
[im 1/1]
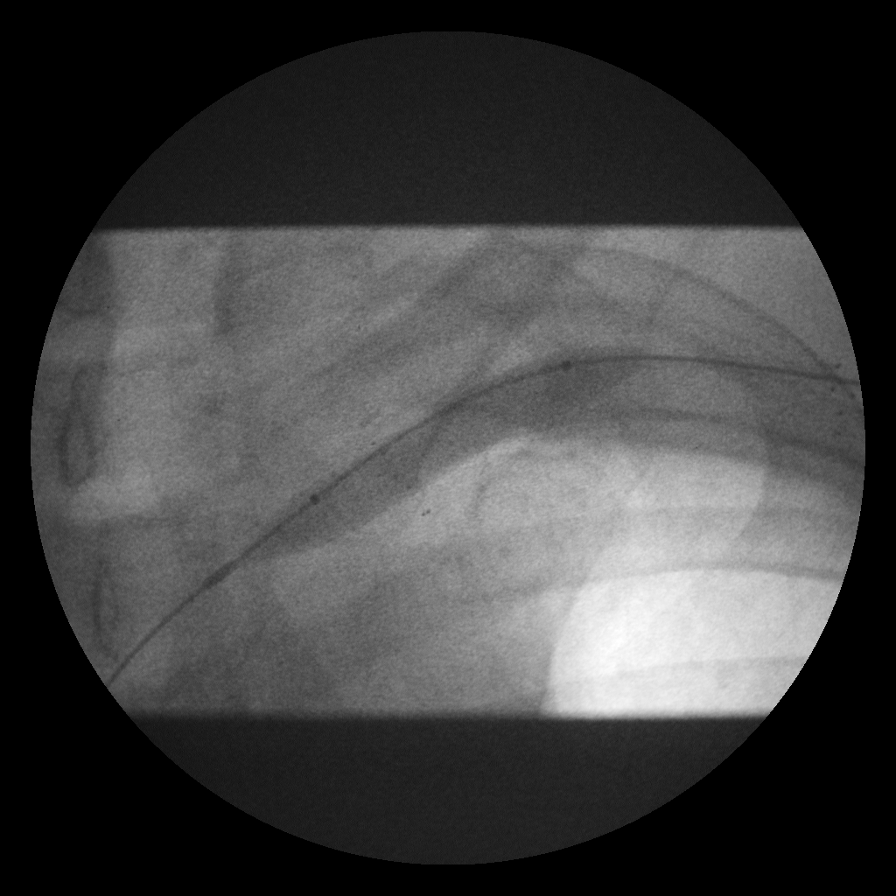

[Series 9: run · 1 of 1 slices shown (5 of 12)]
[im 1/1]
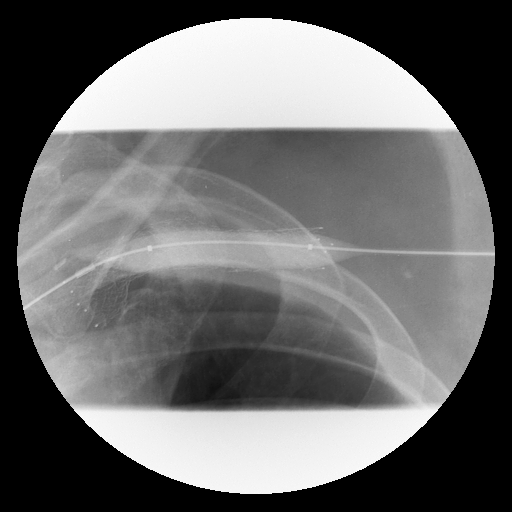

[Series 10: run · 1 of 1 slices shown (6 of 12)]
[im 1/1]
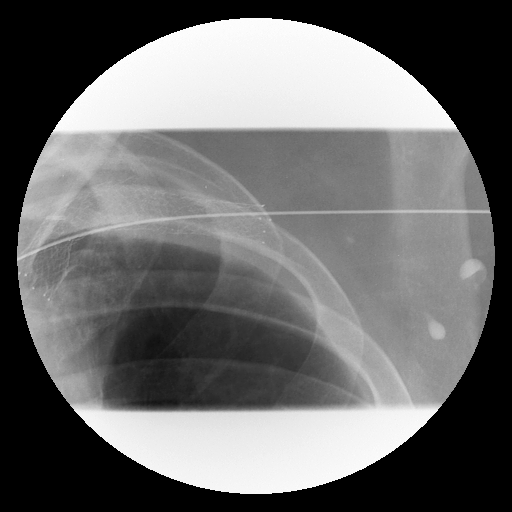

[Series 11: run · 1 of 8 slices shown (7 of 12)]
[im 1/8]
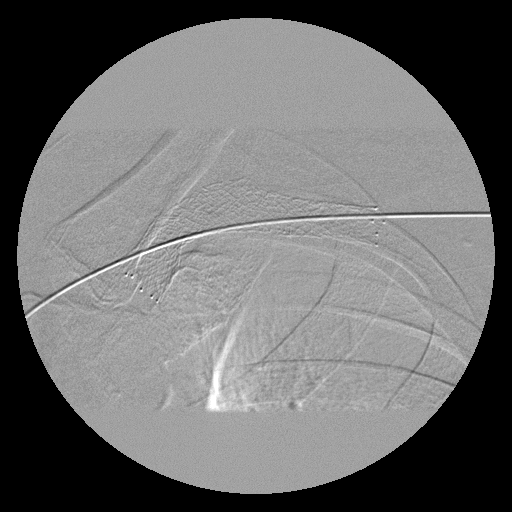

[Series 13: run · 1 of 1 slices shown (8 of 12)]
[im 1/1]
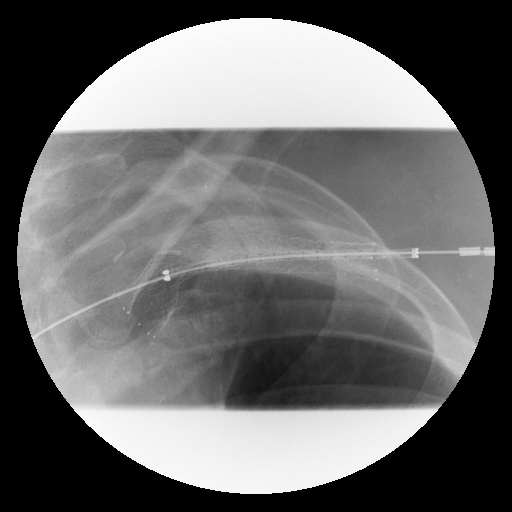

[Series 14: run · 1 of 1 slices shown (9 of 12)]
[im 1/1]
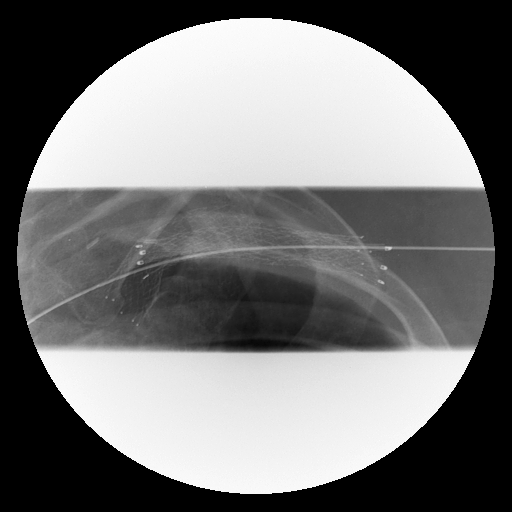

[Series 15: run · 1 of 4 slices shown (10 of 12)]
[im 1/4]
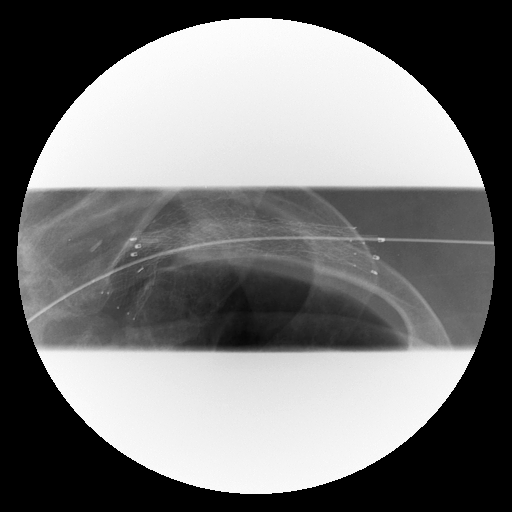

[Series 17: run · 1 of 6 slices shown (11 of 12)]
[im 1/6]
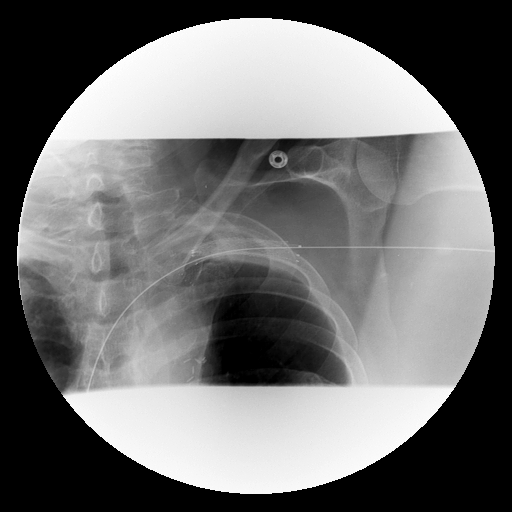

[Series 18: run · 1 of 6 slices shown (12 of 12)]
[im 1/6]
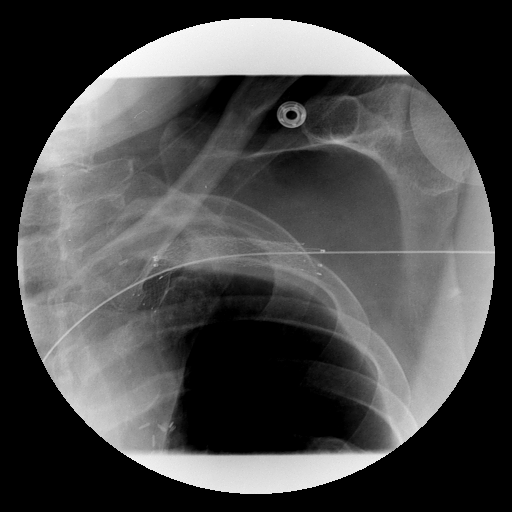

[12 of 16 positions shown; findings below may reference images not displayed]

Radiologist:  Warghi, Marouki.   
Guidance:  Ultrasound and fluoroscopic. 
Complications:  No immediate complications.   
Medications:  6 mg of Versed and 100 mcg of fentanyl.
Contrast:  120 cc of Omnipaque 300.
Fluoro Time:   8.9 minutes.
Procedure/Findings:   Informed consent was obtained.  Overnight, the patient had recurrent left upper extremity edema despite 10 mm angioplasty yesterday.  He represents with shoulder pain and left upper extremity swelling.
Under sterile conditions and local anesthesia, micropuncture needle access was performed of the left brachial vein with ultrasound guidance.  Images were obtained for documentation.  An 018 guidewire was advanced followed by a 4 French dilator.  Over a guidewire, a 9 French sheath was inserted into the brachial vein.  Upper extremity venogram was performed through the sheath.  This demonstrated acute occlusion of the left subclavian venous stent related to a resistant recurrent stenosis with evidence of  elastic recoil.  A small amount of acute thrombus is evident throughout the stent.
Guidewire access was obtained across the stent with a Kumpe catheter and a glidewire.  The catheter and glidewire were manipulated into the IVC.  This allowed exchange for an exchange-length Superstiff Amplatz guidewire across the stented segment.  Repeat balloon angioplasty was performed in preparation for intravascular stent insertion.  Initially graded angioplasty was performed with an 8 mm x 4 cm Conquest balloon followed by a 9 mm x 4 cm Conquest balloon.  A follow-up venogram after angioplasty demonstrated recanalization of the occluded stent with evidence of antegrade flow however, persistent stenoses remain related to intimal hyperplasia and elastic recoil greater than 30%.  Collateral vessels remain visible. 
For additional treatment, a 10 mm x 60 mm Fluency covered Paulus N stent was deployed within the existing 12 mm x 60 mm stent.  The areas of moderate intimal hyperplasia and recurrent stenoses were well covered by the second stent.  This stent was inflated to 9 mm with a 9 x 4 Conquest balloon.  A final follow-up venogram demonstrated a widely patent subclavian  segment with no evidence of residual stenosis, occlusion, or clot formation.  The guidewire was removed.  There were no immediate complications.  Hemostasis was obtained with compression to the venous puncture site for fifteen minutes.
IMPRESSION: 1.  Recurrent left subclavian stented segment thrombotic occlusion related to multifocal stenoses, intimal hyperplasia, and elastic recoil.  
2.  This was retreated with 8 and 9 mm venous angioplasty in preparation for placement of a second 10 mm x 60 mm covered stent.
3.  Final venogram demonstrates a widely patent subclavian restented segment with good venous flow into the innominate vein and SVC.
Plan:  The patient will be recovered for two hours.  He will restart his Coumadin, 10 mg/ day, tonight.  He was also encouraged to perform daily hand exercises to promote upper extremity venous flow.  He will be scheduled for a one month follow-up visit as an outpatient.

## 2008-06-13 ENCOUNTER — Ambulatory Visit (HOSPITAL_COMMUNITY): Admission: RE | Admit: 2008-06-13 | Discharge: 2008-06-13 | Payer: Self-pay | Admitting: Oncology

## 2008-06-14 ENCOUNTER — Ambulatory Visit: Payer: Self-pay | Admitting: Thoracic Surgery (Cardiothoracic Vascular Surgery)

## 2008-07-14 ENCOUNTER — Ambulatory Visit: Payer: Self-pay | Admitting: Oncology

## 2008-07-22 LAB — PROTIME-INR

## 2008-07-24 LAB — PROTIME-INR: Protime: 24 Seconds — ABNORMAL HIGH (ref 10.6–13.4)

## 2008-08-02 LAB — PROTIME-INR
INR: 2.2 (ref 2.00–3.50)
Protime: 26.4 Seconds — ABNORMAL HIGH (ref 10.6–13.4)

## 2008-08-22 LAB — PROTIME-INR

## 2008-09-17 ENCOUNTER — Ambulatory Visit: Payer: Self-pay | Admitting: Oncology

## 2008-09-25 LAB — PROTIME-INR

## 2008-10-21 LAB — PROTIME-INR
INR: 2.5 (ref 2.00–3.50)
Protime: 30 Seconds — ABNORMAL HIGH (ref 10.6–13.4)

## 2008-11-18 ENCOUNTER — Ambulatory Visit: Payer: Self-pay | Admitting: Oncology

## 2008-11-20 LAB — PROTIME-INR: INR: 3.1 (ref 2.00–3.50)

## 2008-12-02 ENCOUNTER — Ambulatory Visit: Payer: Self-pay | Admitting: Thoracic Surgery (Cardiothoracic Vascular Surgery)

## 2008-12-02 ENCOUNTER — Encounter
Admission: RE | Admit: 2008-12-02 | Discharge: 2008-12-02 | Payer: Self-pay | Admitting: Thoracic Surgery (Cardiothoracic Vascular Surgery)

## 2008-12-24 LAB — PROTIME-INR: Protime: 24 Seconds — ABNORMAL HIGH (ref 10.6–13.4)

## 2009-01-21 ENCOUNTER — Ambulatory Visit: Payer: Self-pay | Admitting: Oncology

## 2009-01-23 LAB — PROTIME-INR: Protime: 32.4 Seconds — ABNORMAL HIGH (ref 10.6–13.4)

## 2009-02-06 ENCOUNTER — Encounter: Admission: RE | Admit: 2009-02-06 | Discharge: 2009-02-06 | Payer: Self-pay | Admitting: Family Medicine

## 2009-02-20 LAB — PROTIME-INR: Protime: 32.4 Seconds — ABNORMAL HIGH (ref 10.6–13.4)

## 2009-03-17 ENCOUNTER — Ambulatory Visit: Payer: Self-pay | Admitting: Oncology

## 2009-03-20 LAB — BUN: BUN: 17 mg/dL (ref 6–23)

## 2009-03-20 LAB — PROTIME-INR
INR: 4.6 — ABNORMAL HIGH (ref 2.00–3.50)
Protime: 55.2 Seconds — ABNORMAL HIGH (ref 10.6–13.4)

## 2009-03-20 LAB — CREATININE, SERUM: Creatinine, Ser: 1.31 mg/dL (ref 0.40–1.50)

## 2009-03-24 ENCOUNTER — Ambulatory Visit (HOSPITAL_COMMUNITY): Admission: RE | Admit: 2009-03-24 | Discharge: 2009-03-24 | Payer: Self-pay | Admitting: Oncology

## 2009-03-28 ENCOUNTER — Ambulatory Visit: Payer: Self-pay | Admitting: Thoracic Surgery (Cardiothoracic Vascular Surgery)

## 2009-05-20 ENCOUNTER — Ambulatory Visit: Payer: Self-pay | Admitting: Oncology

## 2009-05-22 LAB — PROTIME-INR: Protime: 39.6 Seconds — ABNORMAL HIGH (ref 10.6–13.4)

## 2009-06-20 ENCOUNTER — Ambulatory Visit: Payer: Self-pay | Admitting: Oncology

## 2009-07-22 ENCOUNTER — Ambulatory Visit: Payer: Self-pay | Admitting: Oncology

## 2009-07-22 LAB — PROTIME-INR
INR: 2.9 (ref 2.00–3.50)
Protime: 34.8 Seconds — ABNORMAL HIGH (ref 10.6–13.4)

## 2009-08-13 ENCOUNTER — Encounter: Admission: RE | Admit: 2009-08-13 | Discharge: 2009-08-13 | Payer: Self-pay | Admitting: Family Medicine

## 2009-08-13 ENCOUNTER — Ambulatory Visit: Payer: Self-pay | Admitting: Gastroenterology

## 2009-08-13 ENCOUNTER — Inpatient Hospital Stay (HOSPITAL_COMMUNITY): Admission: EM | Admit: 2009-08-13 | Discharge: 2009-08-17 | Payer: Self-pay | Admitting: Emergency Medicine

## 2009-08-15 ENCOUNTER — Encounter (INDEPENDENT_AMBULATORY_CARE_PROVIDER_SITE_OTHER): Payer: Self-pay | Admitting: Gastroenterology

## 2009-08-20 ENCOUNTER — Ambulatory Visit: Payer: Self-pay | Admitting: Oncology

## 2009-08-20 LAB — PROTIME-INR: Protime: 16.8 Seconds — ABNORMAL HIGH (ref 10.6–13.4)

## 2009-08-26 LAB — PROTIME-INR
INR: 1.4 — ABNORMAL LOW (ref 2.00–3.50)
Protime: 16.8 Seconds — ABNORMAL HIGH (ref 10.6–13.4)

## 2009-08-29 LAB — CBC WITH DIFFERENTIAL/PLATELET
Basophils Absolute: 0 10*3/uL (ref 0.0–0.1)
Eosinophils Absolute: 0.2 10*3/uL (ref 0.0–0.5)
HGB: 12.7 g/dL — ABNORMAL LOW (ref 13.0–17.1)
MCV: 93.7 fL (ref 79.3–98.0)
MONO%: 6.5 % (ref 0.0–14.0)
NEUT#: 4.8 10*3/uL (ref 1.5–6.5)
RBC: 3.96 10*6/uL — ABNORMAL LOW (ref 4.20–5.82)
RDW: 13.8 % (ref 11.0–14.6)
WBC: 7 10*3/uL (ref 4.0–10.3)
lymph#: 1.5 10*3/uL (ref 0.9–3.3)

## 2009-08-29 LAB — PROTIME-INR
INR: 1.8 — ABNORMAL LOW (ref 2.00–3.50)
Protime: 21.6 Seconds — ABNORMAL HIGH (ref 10.6–13.4)

## 2009-08-29 LAB — COMPREHENSIVE METABOLIC PANEL
ALT: 30 U/L (ref 0–53)
AST: 23 U/L (ref 0–37)
Albumin: 3.9 g/dL (ref 3.5–5.2)
Alkaline Phosphatase: 93 U/L (ref 39–117)
BUN: 12 mg/dL (ref 6–23)
Creatinine, Ser: 0.86 mg/dL (ref 0.40–1.50)
Potassium: 4 mEq/L (ref 3.5–5.3)

## 2009-09-18 LAB — PROTIME-INR

## 2009-09-18 LAB — PROTHROMBIN TIME
INR: 5.3 (ref 0.0–1.5)
Prothrombin Time: 48.3 seconds — ABNORMAL HIGH (ref 11.6–15.2)

## 2009-09-24 ENCOUNTER — Ambulatory Visit: Payer: Self-pay | Admitting: Oncology

## 2009-10-09 LAB — PROTIME-INR

## 2009-10-09 LAB — PROTHROMBIN TIME
INR: 5.2 (ref 0.0–1.5)
Prothrombin Time: 47.8 seconds — ABNORMAL HIGH (ref 11.6–15.2)

## 2009-10-16 LAB — PROTIME-INR: Protime: 18 Seconds — ABNORMAL HIGH (ref 10.6–13.4)

## 2009-10-24 ENCOUNTER — Ambulatory Visit: Payer: Self-pay | Admitting: Oncology

## 2009-10-24 LAB — PROTIME-INR: INR: 1.8 — ABNORMAL LOW (ref 2.00–3.50)

## 2009-11-06 LAB — PROTIME-INR: Protime: 38.4 Seconds — ABNORMAL HIGH (ref 10.6–13.4)

## 2009-11-25 ENCOUNTER — Ambulatory Visit: Payer: Self-pay | Admitting: Oncology

## 2009-11-25 LAB — PROTIME-INR
INR: 2.2 (ref 2.00–3.50)
Protime: 26.4 Seconds — ABNORMAL HIGH (ref 10.6–13.4)

## 2009-12-05 LAB — PROTIME-INR: INR: 2.5 (ref 2.00–3.50)

## 2009-12-18 LAB — PROTIME-INR: INR: 4.5 — ABNORMAL HIGH (ref 2.00–3.50)

## 2009-12-30 ENCOUNTER — Ambulatory Visit: Payer: Self-pay | Admitting: Oncology

## 2010-01-01 LAB — PROTIME-INR
INR: 4.3 — ABNORMAL HIGH (ref 2.00–3.50)
Protime: 51.6 Seconds — ABNORMAL HIGH (ref 10.6–13.4)

## 2010-01-13 LAB — PROTIME-INR: INR: 1.2 — ABNORMAL LOW (ref 2.00–3.50)

## 2010-01-20 LAB — PROTIME-INR

## 2010-01-27 LAB — PROTIME-INR
INR: 1.3 — ABNORMAL LOW (ref 2.00–3.50)
Protime: 15.6 Seconds — ABNORMAL HIGH (ref 10.6–13.4)

## 2010-01-30 ENCOUNTER — Ambulatory Visit: Payer: Self-pay | Admitting: Oncology

## 2010-02-03 LAB — PROTIME-INR: Protime: 44.4 Seconds — ABNORMAL HIGH (ref 10.6–13.4)

## 2010-02-10 LAB — PROTHROMBIN TIME
INR: 4.96 — ABNORMAL HIGH (ref <1.50)
Prothrombin Time: 45.8 seconds — ABNORMAL HIGH (ref 11.6–15.2)

## 2010-02-17 LAB — PROTIME-INR
INR: 4.1 — ABNORMAL HIGH (ref 2.00–3.50)
Protime: 49.2 Seconds — ABNORMAL HIGH (ref 10.6–13.4)

## 2010-02-24 LAB — PROTIME-INR

## 2010-03-03 ENCOUNTER — Ambulatory Visit: Payer: Self-pay | Admitting: Oncology

## 2010-03-03 LAB — PROTHROMBIN TIME
INR: 4.63 — ABNORMAL HIGH (ref <1.50)
Prothrombin Time: 43.4 seconds — ABNORMAL HIGH (ref 11.6–15.2)

## 2010-03-03 LAB — PROTIME-INR

## 2010-03-12 LAB — PROTIME-INR

## 2010-03-27 LAB — PROTIME-INR
INR: 2.8 (ref 2.00–3.50)
Protime: 33.6 Seconds — ABNORMAL HIGH (ref 10.6–13.4)

## 2010-04-03 ENCOUNTER — Ambulatory Visit: Payer: Self-pay | Admitting: Oncology

## 2010-04-08 LAB — PROTHROMBIN TIME
INR: 6.52 (ref <1.50)
Prothrombin Time: 56.7 seconds — ABNORMAL HIGH (ref 11.6–15.2)

## 2010-04-08 LAB — PROTIME-INR

## 2010-04-10 LAB — PROTIME-INR

## 2010-04-17 LAB — PROTIME-INR: INR: 4.8 — ABNORMAL HIGH (ref 2.00–3.50)

## 2010-04-24 LAB — PROTIME-INR

## 2010-05-07 ENCOUNTER — Ambulatory Visit: Payer: Self-pay | Admitting: Oncology

## 2010-05-08 LAB — PROTIME-INR
INR: 4.5 — ABNORMAL HIGH (ref 2.00–3.50)
Protime: 54 Seconds — ABNORMAL HIGH (ref 10.6–13.4)

## 2010-05-22 LAB — PROTIME-INR

## 2010-06-05 LAB — PROTIME-INR
INR: 2.7 (ref 2.00–3.50)
Protime: 32.4 Seconds — ABNORMAL HIGH (ref 10.6–13.4)

## 2010-06-17 ENCOUNTER — Ambulatory Visit: Payer: Self-pay | Admitting: Oncology

## 2010-06-19 LAB — PROTHROMBIN TIME
INR: 4.12 — ABNORMAL HIGH (ref <1.50)
Prothrombin Time: 39.6 seconds — ABNORMAL HIGH (ref 11.6–15.2)

## 2010-06-19 LAB — PROTIME-INR

## 2010-07-03 LAB — PROTIME-INR: INR: 3.5 (ref 2.00–3.50)

## 2010-07-20 ENCOUNTER — Ambulatory Visit: Payer: Self-pay | Admitting: Oncology

## 2010-07-21 LAB — PROTHROMBIN TIME: INR: 4.57 — ABNORMAL HIGH (ref <1.50)

## 2010-07-31 LAB — PROTIME-INR
INR: 3.1 (ref 2.00–3.50)
Protime: 37.2 Seconds — ABNORMAL HIGH (ref 10.6–13.4)

## 2010-08-14 LAB — PROTIME-INR

## 2010-08-14 LAB — PROTHROMBIN TIME: INR: 4.12 — ABNORMAL HIGH (ref <1.50)

## 2010-08-26 ENCOUNTER — Ambulatory Visit: Payer: Self-pay | Admitting: Oncology

## 2010-09-02 ENCOUNTER — Ambulatory Visit (HOSPITAL_COMMUNITY): Admission: RE | Admit: 2010-09-02 | Discharge: 2010-09-02 | Payer: Self-pay | Admitting: Oncology

## 2010-09-25 ENCOUNTER — Ambulatory Visit: Payer: Self-pay | Admitting: Oncology

## 2010-10-16 LAB — PROTIME-INR: Protime: 46.8 Seconds — ABNORMAL HIGH (ref 10.6–13.4)

## 2010-11-04 ENCOUNTER — Ambulatory Visit: Payer: Self-pay | Admitting: Oncology

## 2010-11-06 LAB — PROTIME-INR
INR: 2.7 (ref 2.00–3.50)
Protime: 32.4 Seconds — ABNORMAL HIGH (ref 10.6–13.4)

## 2010-11-27 LAB — PROTIME-INR

## 2010-11-27 LAB — PROTHROMBIN TIME: Prothrombin Time: 40.9 seconds — ABNORMAL HIGH (ref 11.6–15.2)

## 2010-12-10 ENCOUNTER — Ambulatory Visit: Payer: Self-pay | Admitting: Oncology

## 2010-12-11 LAB — PROTIME-INR

## 2011-01-08 LAB — PROTIME-INR
INR: 3.8 — ABNORMAL HIGH (ref 2.00–3.50)
Protime: 45.6 Seconds — ABNORMAL HIGH (ref 10.6–13.4)

## 2011-01-10 ENCOUNTER — Encounter: Payer: Self-pay | Admitting: Oncology

## 2011-01-10 ENCOUNTER — Encounter: Payer: Self-pay | Admitting: Thoracic Surgery (Cardiothoracic Vascular Surgery)

## 2011-01-29 ENCOUNTER — Encounter (HOSPITAL_BASED_OUTPATIENT_CLINIC_OR_DEPARTMENT_OTHER): Payer: 59 | Admitting: Oncology

## 2011-01-29 ENCOUNTER — Other Ambulatory Visit: Payer: Self-pay | Admitting: Oncology

## 2011-01-29 DIAGNOSIS — Z7901 Long term (current) use of anticoagulants: Secondary | ICD-10-CM

## 2011-01-29 DIAGNOSIS — Z85118 Personal history of other malignant neoplasm of bronchus and lung: Secondary | ICD-10-CM

## 2011-01-29 DIAGNOSIS — Z86718 Personal history of other venous thrombosis and embolism: Secondary | ICD-10-CM

## 2011-01-29 LAB — PROTIME-INR
INR: 3.4 (ref 2.00–3.50)
Protime: 40.8 Seconds — ABNORMAL HIGH (ref 10.6–13.4)

## 2011-02-08 ENCOUNTER — Encounter (HOSPITAL_BASED_OUTPATIENT_CLINIC_OR_DEPARTMENT_OTHER): Payer: 59 | Admitting: Oncology

## 2011-02-08 ENCOUNTER — Other Ambulatory Visit: Payer: Self-pay | Admitting: Oncology

## 2011-02-08 DIAGNOSIS — Z7901 Long term (current) use of anticoagulants: Secondary | ICD-10-CM

## 2011-02-08 DIAGNOSIS — Z86718 Personal history of other venous thrombosis and embolism: Secondary | ICD-10-CM

## 2011-02-08 DIAGNOSIS — Z85118 Personal history of other malignant neoplasm of bronchus and lung: Secondary | ICD-10-CM

## 2011-02-08 LAB — CBC WITH DIFFERENTIAL/PLATELET
Basophils Absolute: 0.1 10*3/uL (ref 0.0–0.1)
EOS%: 1.6 % (ref 0.0–7.0)
Eosinophils Absolute: 0.1 10*3/uL (ref 0.0–0.5)
HGB: 14.6 g/dL (ref 13.0–17.1)
LYMPH%: 27.4 % (ref 14.0–49.0)
MCH: 33 pg (ref 27.2–33.4)
MCV: 95.4 fL (ref 79.3–98.0)
MONO%: 7.5 % (ref 0.0–14.0)
NEUT#: 4.7 10*3/uL (ref 1.5–6.5)
NEUT%: 62.5 % (ref 39.0–75.0)
Platelets: 271 10*3/uL (ref 140–400)
RDW: 14.9 % — ABNORMAL HIGH (ref 11.0–14.6)

## 2011-02-08 LAB — PROTIME-INR: INR: 2.7 (ref 2.00–3.50)

## 2011-02-19 ENCOUNTER — Encounter (HOSPITAL_BASED_OUTPATIENT_CLINIC_OR_DEPARTMENT_OTHER): Payer: 59 | Admitting: Oncology

## 2011-02-19 ENCOUNTER — Other Ambulatory Visit: Payer: Self-pay | Admitting: Oncology

## 2011-02-19 DIAGNOSIS — Z86718 Personal history of other venous thrombosis and embolism: Secondary | ICD-10-CM

## 2011-02-19 DIAGNOSIS — Z85118 Personal history of other malignant neoplasm of bronchus and lung: Secondary | ICD-10-CM

## 2011-02-19 DIAGNOSIS — Z7901 Long term (current) use of anticoagulants: Secondary | ICD-10-CM

## 2011-02-19 LAB — CBC WITH DIFFERENTIAL/PLATELET
Basophils Absolute: 0.1 10*3/uL (ref 0.0–0.1)
Eosinophils Absolute: 0.1 10*3/uL (ref 0.0–0.5)
HGB: 14.3 g/dL (ref 13.0–17.1)
MONO#: 0.8 10*3/uL (ref 0.1–0.9)
MONO%: 13.6 % (ref 0.0–14.0)
NEUT#: 3.2 10*3/uL (ref 1.5–6.5)
RBC: 4.42 10*6/uL (ref 4.20–5.82)
RDW: 13.3 % (ref 11.0–14.6)
WBC: 5.7 10*3/uL (ref 4.0–10.3)
lymph#: 1.5 10*3/uL (ref 0.9–3.3)

## 2011-02-19 LAB — PROTIME-INR
INR: 2.8 (ref 2.00–3.50)
Protime: 33.6 Seconds — ABNORMAL HIGH (ref 10.6–13.4)

## 2011-03-22 ENCOUNTER — Encounter (HOSPITAL_BASED_OUTPATIENT_CLINIC_OR_DEPARTMENT_OTHER): Payer: 59 | Admitting: Oncology

## 2011-03-22 ENCOUNTER — Other Ambulatory Visit: Payer: Self-pay | Admitting: Oncology

## 2011-03-22 DIAGNOSIS — Z86718 Personal history of other venous thrombosis and embolism: Secondary | ICD-10-CM

## 2011-03-22 DIAGNOSIS — Z7901 Long term (current) use of anticoagulants: Secondary | ICD-10-CM

## 2011-03-22 LAB — PROTIME-INR: INR: 1.4 — ABNORMAL LOW (ref 2.00–3.50)

## 2011-03-27 LAB — URINE CULTURE
Colony Count: NO GROWTH
Culture: NO GROWTH

## 2011-03-27 LAB — HEPATIC FUNCTION PANEL
ALT: 33 U/L (ref 0–53)
AST: 31 U/L (ref 0–37)
Alkaline Phosphatase: 107 U/L (ref 39–117)
Bilirubin, Direct: 0.4 mg/dL — ABNORMAL HIGH (ref 0.0–0.3)
Indirect Bilirubin: 1 mg/dL — ABNORMAL HIGH (ref 0.3–0.9)

## 2011-03-27 LAB — PROTIME-INR
INR: 1.5 (ref 0.00–1.49)
INR: 1.5 (ref 0.00–1.49)
INR: 1.9 — ABNORMAL HIGH (ref 0.00–1.49)
INR: 2.6 — ABNORMAL HIGH (ref 0.00–1.49)
INR: 3 — ABNORMAL HIGH (ref 0.00–1.49)
Prothrombin Time: 18.2 seconds — ABNORMAL HIGH (ref 11.6–15.2)

## 2011-03-27 LAB — CBC
HCT: 31.5 % — ABNORMAL LOW (ref 39.0–52.0)
HCT: 41.2 % (ref 39.0–52.0)
Hemoglobin: 10.8 g/dL — ABNORMAL LOW (ref 13.0–17.0)
Hemoglobin: 12.9 g/dL — ABNORMAL LOW (ref 13.0–17.0)
MCHC: 33.9 g/dL (ref 30.0–36.0)
MCHC: 34 g/dL (ref 30.0–36.0)
MCV: 96.1 fL (ref 78.0–100.0)
Platelets: 235 10*3/uL (ref 150–400)
Platelets: 245 10*3/uL (ref 150–400)
RBC: 3.25 MIL/uL — ABNORMAL LOW (ref 4.22–5.81)
RBC: 3.96 MIL/uL — ABNORMAL LOW (ref 4.22–5.81)
RDW: 13.9 % (ref 11.5–15.5)
RDW: 14.1 % (ref 11.5–15.5)
RDW: 14.2 % (ref 11.5–15.5)
WBC: 13.6 10*3/uL — ABNORMAL HIGH (ref 4.0–10.5)
WBC: 6.6 10*3/uL (ref 4.0–10.5)
WBC: 9 10*3/uL (ref 4.0–10.5)

## 2011-03-27 LAB — BASIC METABOLIC PANEL
BUN: 2 mg/dL — ABNORMAL LOW (ref 6–23)
BUN: 4 mg/dL — ABNORMAL LOW (ref 6–23)
BUN: 7 mg/dL (ref 6–23)
Calcium: 7.9 mg/dL — ABNORMAL LOW (ref 8.4–10.5)
Chloride: 99 mEq/L (ref 96–112)
Creatinine, Ser: 0.6 mg/dL (ref 0.4–1.5)
Creatinine, Ser: 0.81 mg/dL (ref 0.4–1.5)
GFR calc Af Amer: >60 mL/min (ref >60)
GFR calc Af Amer: >60 mL/min (ref >60)
GFR calc Af Amer: >60 mL/min (ref >60)
GFR calc non Af Amer: >60 mL/min (ref >60)
GFR calc non Af Amer: >60 mL/min (ref >60)
GFR calc non Af Amer: >60 mL/min (ref >60)
Glucose, Bld: 123 mg/dL — ABNORMAL HIGH (ref 70–99)
Glucose, Bld: 95 mg/dL (ref 70–99)
Potassium: 3 mEq/L — ABNORMAL LOW (ref 3.5–5.1)
Potassium: 3.3 mEq/L — ABNORMAL LOW (ref 3.5–5.1)
Potassium: 3.4 mEq/L — ABNORMAL LOW (ref 3.5–5.1)
Sodium: 133 mEq/L — ABNORMAL LOW (ref 135–145)
Sodium: 137 mEq/L (ref 135–145)

## 2011-03-27 LAB — EHEC TOXIN BY EIA, STOOL: EHEC Toxin by EIA: NEGATIVE

## 2011-03-27 LAB — DIFFERENTIAL
Basophils Absolute: 0.1 10*3/uL (ref 0.0–0.1)
Basophils Absolute: 0.1 10*3/uL (ref 0.0–0.1)
Basophils Relative: 1 % (ref 0–1)
Eosinophils Absolute: 0.1 10*3/uL (ref 0.0–0.7)
Eosinophils Relative: 1 % (ref 0–5)
Lymphocytes Relative: 9 % — ABNORMAL LOW (ref 12–46)
Lymphs Abs: 1.2 10*3/uL (ref 0.7–4.0)
Monocytes Absolute: 0.8 10*3/uL (ref 0.1–1.0)
Neutro Abs: 7.2 10*3/uL (ref 1.7–7.7)
Neutrophils Relative %: 79 % — ABNORMAL HIGH (ref 43–77)
Neutrophils Relative %: 82 % — ABNORMAL HIGH (ref 43–77)

## 2011-03-27 LAB — CULTURE, BLOOD (ROUTINE X 2)
Culture: NO GROWTH
Culture: NO GROWTH

## 2011-03-27 LAB — STOOL CULTURE

## 2011-03-27 LAB — CLOSTRIDIUM DIFFICILE EIA: C difficile Toxins A+B, EIA: NEGATIVE

## 2011-03-27 LAB — URINALYSIS, MICROSCOPIC ONLY
Bilirubin Urine: NEGATIVE
Hgb urine dipstick: NEGATIVE
Ketones, ur: NEGATIVE mg/dL
Nitrite: NEGATIVE
pH: 6 (ref 5.0–8.0)

## 2011-03-27 LAB — SODIUM, URINE, RANDOM: Sodium, Ur: 10 mEq/L

## 2011-03-27 LAB — OVA AND PARASITE EXAMINATION: Ova and parasites: NONE SEEN

## 2011-03-27 LAB — CORTISOL: Cortisol, Plasma: 7.7 ug/dL

## 2011-03-27 LAB — HEMOCCULT GUIAC POC 1CARD (OFFICE): Fecal Occult Bld: POSITIVE

## 2011-03-27 LAB — CREATININE, URINE, RANDOM: Creatinine, Urine: 27.9 mg/dL

## 2011-04-02 ENCOUNTER — Encounter (HOSPITAL_BASED_OUTPATIENT_CLINIC_OR_DEPARTMENT_OTHER): Payer: 59 | Admitting: Oncology

## 2011-04-02 ENCOUNTER — Other Ambulatory Visit: Payer: Self-pay | Admitting: Oncology

## 2011-04-02 DIAGNOSIS — Z7901 Long term (current) use of anticoagulants: Secondary | ICD-10-CM

## 2011-04-02 DIAGNOSIS — Z86718 Personal history of other venous thrombosis and embolism: Secondary | ICD-10-CM

## 2011-04-02 DIAGNOSIS — Z85118 Personal history of other malignant neoplasm of bronchus and lung: Secondary | ICD-10-CM

## 2011-04-02 LAB — PROTIME-INR: Protime: 21.6 Seconds — ABNORMAL HIGH (ref 10.6–13.4)

## 2011-04-09 ENCOUNTER — Other Ambulatory Visit: Payer: Self-pay | Admitting: Oncology

## 2011-04-09 ENCOUNTER — Encounter (HOSPITAL_BASED_OUTPATIENT_CLINIC_OR_DEPARTMENT_OTHER): Payer: 59 | Admitting: Oncology

## 2011-04-09 DIAGNOSIS — Z86718 Personal history of other venous thrombosis and embolism: Secondary | ICD-10-CM

## 2011-04-09 DIAGNOSIS — Z7901 Long term (current) use of anticoagulants: Secondary | ICD-10-CM

## 2011-04-09 LAB — PROTIME-INR
INR: 2.1 (ref 2.00–3.50)
Protime: 25.2 Seconds — ABNORMAL HIGH (ref 10.6–13.4)

## 2011-04-30 ENCOUNTER — Encounter (HOSPITAL_BASED_OUTPATIENT_CLINIC_OR_DEPARTMENT_OTHER): Payer: 59 | Admitting: Oncology

## 2011-04-30 ENCOUNTER — Other Ambulatory Visit: Payer: Self-pay | Admitting: Oncology

## 2011-04-30 DIAGNOSIS — Z7901 Long term (current) use of anticoagulants: Secondary | ICD-10-CM

## 2011-04-30 DIAGNOSIS — Z86718 Personal history of other venous thrombosis and embolism: Secondary | ICD-10-CM

## 2011-04-30 LAB — PROTIME-INR
INR: 2.8 (ref 2.00–3.50)
Protime: 33.6 Seconds — ABNORMAL HIGH (ref 10.6–13.4)

## 2011-05-04 NOTE — Assessment & Plan Note (Signed)
OFFICE VISIT   [name], [name]  DOB:  1946/10/20                                        March 28, 2009  CHART #:  [id number]   REASON FOR VISIT:  Follow up lung cancer.   HISTORY OF PRESENT ILLNESS:  The patient is a 65 year old gentleman, who  had a left upper lobectomy for T3, N0 non-small cell carcinoma in April  2005, who returns today for his 5-year followup visit.  He has no  complaints.  He has been doing well.  He is getting ready to retire in  June.  He has had no problems with his breathing, cough, hemoptysis,  weight loss, or other symptoms.   PHYSICAL EXAMINATION:  GENERAL:  The patient is a 65 year old gentleman  in no acute distress.  VITAL SIGNS:  Blood pressure is 129/88, pulse 104, respirations 18, his  oxygen saturation is 96% on room air.  LUNGS:  Clear with equal breath sounds.  NECK:  There is no cervical, supraclavicular, axillary, or epitrochlear  adenopathy.   CT scan of the chest and abdomen was done.  There is some fatty  infiltration in the liver which is unchanged.  There is no evidence of  recurrent or metastatic colon cancer.   IMPRESSION:  The patient is now 5 years out from surgery.  He was  treated with preoperative chemotherapy and radiation followed by surgery  then followed by chemotherapy.  He has no evidence of recurrent disease  at this time.  He has an appointment with Dr. Donnie Coffin in September  and hopefully will continue to be followed by his medical doctor and Dr.  Hyacinth Meeker as well.  I would be happy to see him back anytime, but he does  not need to schedule additional followup with me.   Salvatore Decent Dorris Fetch, M.D.  Electronically Signed   SCH/MEDQ  D:  03/28/2009  T:  03/29/2009  Job:  [id number]   cc:   Pierce Crane, MD  Dr. Hyacinth Meeker

## 2011-05-04 NOTE — Assessment & Plan Note (Signed)
OFFICE VISIT   SCHNEIDER, WARCHOL  DOB:  1946/04/11                                        May 30, 2007  CHART #:  [id number]   Mr. Vanallen is a 65 year old gentleman who had a left upper lobectomy for  a T3, N0 nonsmall cell carcinoma in April 2005.  He was last seen in the  office in November 2007, and at that time was doing well.  He states  that over the past 6 months he has been doing well.  He has not had any  new or unusual cough, hemoptysis, any new visual changes, or any new or  unusual bone or joint pains.  Overall, he has been feeling well.  He  recently had CT and PET scan, as well as a head CT.   Patient's medical history form is reviewed, and is on the chart.   CURRENT MEDICATIONS:  1. Xalatan eye drops 1 drop nightly.  2. Caduet 5/20 one tablet daily.  3. Avalide 300/12.5 one tablet daily.  4. Coumadin 10 mg daily.   PHYSICAL EXAMINATION:  Mr. Graybeal is a 65 year old white male in no acute  distress.  His blood pressure is 143/86.  Pulse 92.  Respirations 18.  His oxygen  saturation is 95% on room air.  Lungs:  Clear with essentially equal  breath sounds.  Cardiac:  Exam has regular rate and rhythm.  Normal S1  and S2.  No rubs, murmurs, or gallops.  He has no cervical,  supraclavicular, axillary, or epitrochlear adenopathy.   His scans were reviewed.  His CT of the chest shows some stable  postoperative changes.  There are some small mesenteric nodes and  mesenteric stranding on the abdominal portion of the CT.  His PET scan  showed no increased or unusual activity in the chest.  There was what  was described as some focal increased uptake in the cecum, which was  felt to likely represent concentration of excreted activity.  This was  nonspecific, but it was suggested that he be followed with Hemoccult  testing of his stool.  He also had a head CT, which was negative for  pathology.   IMPRESSION:  Mr. Conwell is a 65 year old who is now 3  years and 2 months  out from lobectomy for lung cancer, which was T3, N0.  He is doing well  at this point in time.  His physical activity is excellent.  He  continues to take good care of himself medically.  He has no evidence of  recurrent disease.  I will plan to see him back in 6 months.  I will  just order a chest x-ray at that time.  I will leave this to Dr. Donnie Coffin  if he wants to do any additional testing.  I did, once again, go over  with Mr. Reeves that although the majority of recurrences occur within 2  years, we do follow for 5 years before we consider the cancer cured.   I am going to see if we can arrange for outpatient Hemoccult stool  testing just to confirm that this PET scan finding is not indicative of  a more significant problems.   Salvatore Decent Dorris Fetch, M.D.  Electronically Signed   SCH/MEDQ  D:  05/30/2007  T:  05/30/2007  Job:  [id number]  cc:   Pierce Crane, M.D.  Dellis Anes Idell Pickles, M.D.

## 2011-05-04 NOTE — H&P (Signed)
NAME:  [name], [name] NO.:  [id number]   MEDICAL RECORD NO.:  [id number]          PATIENT TYPE:  INP   LOCATION:  0101                         FACILITY:  [facility]   PHYSICIAN:  Ramiro Harvest, MD    DATE OF BIRTH:  09/11/1946   DATE OF ADMISSION:  08/13/2009  DATE OF DISCHARGE:                              HISTORY & PHYSICAL   PRIMARY CARE PHYSICIAN:  Caryn Bee L. Little, MD, of [facility].   HISTORY OF PRESENT ILLNESS:  [name] is 65 year old white  gentleman with a history of T3N0 non-small cell lung cancer status post  left upper lobectomy in 2005, history of COPD, hypertension,  diverticulosis, GERD, left inguinal hernia status post repair with a  mesh, who presents to the ED with a 2-day history of diffuse  midabdominal sharp pain.  The patient states that 2 days prior to  admission, he was playing golf when he developed sharp knife-like pain  in his midabdomen with a gassy feeling, which then eased off until the  game was over.  The patient then was on his way home when his abdominal  pain worsened.  One day prior to admission, the patient developed low-  grade fevers of 100.4, questionable chills, some diarrhea, and some  worsening abdominal pain on palpation at night.  The patient also stated  that he had some diarrhea that was reddish-brown in color.  Denies any  mucus in his stools.  Denies any weakness, no cough, no nausea, no  vomiting, no constipation, no dysuria, no focal neurological symptoms.  The patient denies any recent antibiotic use, no recent travel, and no  use of well water.  The patient presented to his PCPs office on the day  of admission and was sent for a CT scan, which showed a right-sided  colitis.  The patient was also noted to have a sodium of 113 and  instructed to come to the ED.  In the ED, basic metabolic profile done  showed a sodium of 127, a potassium of 3.0, chloride of 87.  CBC with a  white count of 13.6 with an  ANC of 11.2.  The patient was given some IV  Cipro and Flagyl.  We were called to admit the patient.   ALLERGIES:  No known drug allergies.   PAST MEDICAL HISTORY:  1. A T3N0 non-small cell lung cancer status post left upper lobe      lobectomy and mediastinal lymph node dissection as well as chest      wall biopsy, March 24, 2004.  2. 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  3. COPD.  4. Hypertension.  5. Hyperlipidemia.  6. Gastroesophageal reflux disease.  7. History of orbital pseudotumor.  8. Macular degeneration.  9. A left inguinal hernia repair with mesh.  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 Miles Costain.  11.A history of diverticulosis.   HOME MEDICATIONS:  1. Caduet 5/20, one tablet p.o. daily.  2. Calcium 1200-1000 mg units as directed.  3. Coumadin 10 mg p.o. daily.  4. Multivitamin Centrum 1 tablet daily.  5. Xalatan 0.005%, one drop into the affected eye q.h.s.  6. Avalide 300/12.5 mg p.o. daily.   SOCIAL HISTORY:  The patient is married, no tobacco use, the patient  drinks about 4 to 5 bourbons and Cokes per day, No IV drug use.   FAMILY HISTORY:  Noncontributory.   REVIEW OF SYSTEMS:  As per HPI, otherwise negative.   PHYSICAL EXAMINATION:  VITAL SIGNS:  Temperature 99.0, blood pressure  136/93, heart rate of 121, respiratory rate of 20, sating 95%.  GENERAL:  The patient is a well-developed, well-nourished, white  gentleman in no apparent distress.  HEENT:  Normocephalic, atraumatic.  Pupils equal, round and reactive to  light and accommodation, extraocular movements intact.  Oropharynx is  clear, no lesions, no exudates.  NECK:  Supple, no lymphadenopathy.  RESPIRATORY:  Lungs are clear to  auscultation bilaterally, no wheezes,  no crackles, no rhonchi.  CARDIOVASCULAR:  Regular rate, rhythm, no  murmurs, rubs, or gallops.  ABDOMEN:  Soft, nontender, and nondistended, positive bowel sounds.  EXTREMITIES:  No clubbing, cyanosis, or edema.  NEUROLOGICAL:  The patient is alert and oriented x3.  Cranial nerves II-  XII are grossly intact.  No focal deficits.   ADMISSION LABORATORY DATA:  Sodium 127, potassium 3.0, chloride 87,  bicarb 28, BUN 7, creatinine 0.81, glucose of 123, calcium of 9.0.  CBC  with a white count of 13.6, hemoglobin 14.1, platelets of 235,  hematocrit 41.2, ANC of 11.2.  CT of the abdomen and pelvis did show  acute right-sided colitis.  Differential diagnosis includes  pseudomembranous colitis and other infectious etiologies of the colitis  with ischemic colitis considered less likely, stable diffuse fatty  infiltration of the liver, stable shoddy mesenteric lymph nodes, a small  amount of free fluid in the pelvic cul-de-sac, no evidence of abscess or  other acute findings in the pelvis.   ASSESSMENT AND PLAN:  [name] is a 65 year old gentleman,  history of hypertension, hyperlipidemia, history of non-small cell lung  cancer status post left upper lobectomy, history of diverticulosis,  presented to the emergency department with a right-sided colitis.  1. Right-sided colitis:  Differential includes infectious etiology      versus inflammatory versus ischemic.  We will admit the patient and      check stool studies for Clostridium difficile, ova and parasites,      enteric pathogens.  We will guaiac stools.  Check a hepatic panel.      Check a magnesium level.  Place on intravenous Cipro and Flagyl.      Place on a clear liquid diet.  May need a Gastroenterology      consultation in the morning for further evaluation and management.  2. Hyponatremia:  Likely secondary to a hypovolemic hyponatremia in      the setting of a diuretic use versus  euvolemia versus hypovolemic      hyponatremia.  We will check a fractional excretion of sodium.      Check an  orthostatics.  Check TSH.  Check a random cortisol level.      Place on intravenous fluids and follow.  If no improvement, we will      check  a urine and serum osmolalities and a chest x-ray and a head      computerized tomography and follow.  3. Hypokalemia:  Check a magnesium level and replete.  4. Tachycardia:  Likely secondary to problem #1.  We will check an      electrocardiogram.  Hydrate with intravenous fluids, pain      management, and follow.  5. Hypertension:  Hold blood pressure medications of Avalide and      continue on Norvasc.  6. Hyperlipidemia:  Continue home dose Caduet.  7. Gastroesophageal reflux disease:  Protonix.  8. 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  9. T3N0 non-small cell lung cancer status post lobectomy:  Stable.  10.Prophylaxis:  Protonix for gastrointestinal prophylaxis, Lovenox      for deep venous thrombosis prophylaxis.  If INR/PT is therapeutic,      we will discontinue Lovenox.      Ramiro Harvest, MD  Electronically Signed     DT/MEDQ  D:  08/13/2009  T:  08/13/2009  Job:  [id number]   cc:   Pierce Crane, MD  Anselmo Rod, M.D.  Anna Genre Little, M.D.

## 2011-05-04 NOTE — Assessment & Plan Note (Signed)
OFFICE VISIT   SPARSH, CALLENS  DOB:  Feb 28, 1946                                        November 28, 2007  CHART #:  [id number]   HISTORY OF PRESENT ILLNESS:  The patient is a 65 year old gentleman who  had a left upper lobectomy for T3 N0 non-small cell carcinoma in  03/2004.  In 07/2004, there was questionable finding on his PET scan and  CT.  He underwent bronchoscopy and needle biopsy at that time which were  negative.  Since that time, he has done well with no evidence of  recurrent disease.  He did have an unusual finding on a PET scan which  showed some increased uptake in the area of the cecum, and there were  some scattered mesenteric lymph nodes which were more prominent than  normal.  Dr. Donnie Coffin has been following that as well.  He did have  Hemoccult testing back in 05/2007 which was negative x3.   The patient returns today for a 63-month follow-up visit.  He states that  he has been doing well since his last visit.  He has not had any cough,  hemoptysis, new bone or joint pain or any unusual signs or symptoms.  He  has been feeling well and remaining active.  He is moving back to  [place] from [place].   The patient's medical history form is reviewed and is on the chart.  There have been no interval medication changes since his last visit.   REVIEW OF SYSTEMS:  See HPI, otherwise negative.   PHYSICAL EXAMINATION:  GENERAL:  The patient is a well-appearing 61-year-  old white male in no acute distress.  VITAL SIGNS:  His blood pressure  is 155/94, pulse 98, respirations 18, oxygen saturation 95% on room air.  LUNGS:  Clear with equal breath sounds.  CARDIAC:  Has regular rate and  rhythm, normal S1 and S2.  LYMPH NODES:  There is no cervical,  supraclavicular, axillary or epitrochlear adenopathy.   CT scan of chest, abdomen and pelvis and PET scan were reviewed.  The CT  of the chest shows postoperative changes.  There is no evidence  of  recurrent disease by CT.  The PET report has no indication of  abnormality in the abdomen.  On the CT, there are still misty mesentery  and small bowel mesenteric lymph nodes which are unchanged from his  previous CT.   IMPRESSION:  The patient is doing very well at this point in time.  He  is now about 3-1/2 years out from surgery with no evidence of recurrent  disease.  I will plan to see him back around 03/2008 or 04/2008 for his  4-year follow-up visit.  I have gone again and scheduled him for a  computed tomography of the chest, abdomen and pelvis.  We will take  another look at that area in the cecum and the mesentery in the right  lower quadrant as well as scanning his chest once again on his annual  visit.  He also has an appointment with Dr. Donnie Coffin which has been  rescheduled to 12/2007.  I will leave it up to Dr. Donnie Coffin as to whether  he wants to do another PET scan at that time.  I think I will defer that  to his judgment.   Viviann Spare  Lars Pinks, M.D.  Electronically Signed   SCH/MEDQ  D:  11/28/2007  T:  11/28/2007  Job:  [id number]   cc:   Pierce Crane, MD  Dellis Anes. Idell Pickles, M.D.

## 2011-05-04 NOTE — Discharge Summary (Signed)
NAME:  [name], [name]               ACCOUNT NO.:  [id number]   MEDICAL RECORD NO.:  [id number]          PATIENT TYPE:  INP   LOCATION:  1434                         FACILITY:  [facility]   PHYSICIAN:  Hollice Espy, M.D.DATE OF BIRTH:  1946-04-06   DATE OF ADMISSION:  08/13/2009  DATE OF DISCHARGE:  08/17/2009                               DISCHARGE SUMMARY   ATTENDING PHYSICIAN:  Hollice Espy, MD   CONSULTANTS:  Jordan Hawks. Elnoria Howard, MD, gastroenterology.   PRIMARY CARE PHYSICIAN:  Caryn Bee L. Little, MD   DISCHARGE DIAGNOSES:  1. Ascending right-sided colitis, pathology pending.  Differential      diagnosis may be inflammatory versus ischemic versus infectious.  2. Secondary diarrhea.  3. History of lung cancer.  4. 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  5. Hypokalemia, resolved.   HOSPITAL COURSE:  The patient is a 65 year old white male with past  medical history of lung CA as previous history and previous history of  DVT on chronic anticoagulation who presented to the emergency room  complaining of nausea, vomiting and increased diarrhea.  He had actually  been suffering from diarrhea for several months.  Previous workup by his  GI doctor, Dr. Loreta Ave, had been inconclusive.  When he came into the  emergency room he was slightly dehydrated.  His sodium was 127.  His  white count was 13.69 and a CT of the abdomen and pelvis showed an  ascending right-sided colitis.  The patient was initially made n.p.o.,  put on IV fluids and Dr. Loreta Ave, his GI doctor, was notified.  Dr. Elnoria Howard,  Dr. Kenna Gilbert partner, saw the patient and the patient was started on IV  Cipro and Flagyl.  He went for a colonoscopy done August 27.  The  patient was found to have some inflammatory stenosis at the area of the  ascending colon which the colonoscopy was not able to be advanced.  At  the stenosis site there were multiple ulcerations.  Cold biopsies were  obtained and C. diff cultures were  obtained as well, all of which were  negative.  The scope was removed.  The patient was continued on  antibiotics.  Over the next several days he continued to improve.  His  diet was advanced and he was able to tolerate p.o.  As of August 29 he  was felt to be medically stable for discharge.  His pathology at this  time is pending.  Dr. Elnoria Howard felt that the patient should continue Cipro  and Flagyl for a total of 7 days of therapy.  He will follow up with the  patient in a week's time to follow up on the pathology and determine the  cause of this colitis.  The patient was amenable to this plan.  In the  interim he has been continued on his Coumadin at 10 mg.  His INR became  slightly subtherapeutic.  He will increase his dose.  At day of  discharge his INR was down to 829.  Because he currently does not have  an active DVT he was felt to be medically  stable to be discharged.  He  will increase his dose to 12.5 and he has a followup appointment to have  his INR checked on August 21, 2009, at the [facility].  His other medical issues are stable.   DISCHARGE DIET:  Will be a low-sodium diet.   DISPOSITION:  He is being discharged to home.  His disposition is  improved.   ACTIVITY:  Will be slowly increased.   FOLLOWUP:  With Dr. Clarene Duke in the next several weeks.  With Dr. Elnoria Howard in  1 week's time.      Hollice Espy, M.D.  Electronically Signed     SKK/MEDQ  D:  08/17/2009  T:  08/17/2009  Job:  [id number]   cc:   Jordan Hawks. Elnoria Howard, MD  Fax: [phone]   Anna Genre. Little, M.D.  Fax: [phone]

## 2011-05-04 NOTE — Assessment & Plan Note (Signed)
OFFICE VISIT   [name], [name]  DOB:  1946-08-12                                        June 14, 2008  CHART #:  [id number]   The patient is status post resection of a non-small cell carcinoma, left  upper lobe, done by Dr. Dorris Fetch, dating back to March 24, 2004.  The  patient presents for a 4-year followup visit.  He is without complaints.  He denies any shortness of breath, coughing, wheezing, hemoptysis, or  chest pain.  He does have an appointment with Dr. Donnie Coffin in the next  several days.  The patient states that Dr. Donnie Coffin plans to do another CT  of the chest, abdomen, and pelvis with a PET scan again in 6 months.  The patient is continuing normal activities.   PHYSICAL EXAMINATION:  VITALS:  Blood pressure of 111/69, pulse 93,  respirations 18, and O2 sats 96% on room air.  RESPIRATORY:  Clear to auscultation bilaterally.  CARDIAC:  Regular rate and rhythm.  S1 and S2 noted.  ABDOMEN:  Benign.  Bowel sounds x4.  EXTREMITIES:  Warm.  LYMPHATICS:  No lymphadenopathy noted.   STUDIES:  The patient had a CT of the chest, abdomen, and pelvis done  June 13, 2008.  His CT as well as CT/PET have been reviewed by Dr.  Dorris Fetch.  CT of the chest shows to be stable with no evidence of  recurrent metastatic disease.  CT of the abdomen shows stable diffuse  fatty infiltration of liver with stable and nonspecific shotty lymph  nodes in small bowel mesentery.  CT of the pelvis shows stable pelvis  with no evidence of metastatic disease or other acute findings.  PET/CT  scan shows no evidence of malignancy or metastatic disease.   IMPRESSION AND PLAN:  The patient is 4 years out now following surgery  for resection of a left upper lobe non-small cell carcinoma.  The  patient is continuing to do well.  There are no signs of recurrence on  CT as noted above.  He was seen and evaluated by Dr. Dorris Fetch.  We  will plan to follow the patient back up in 6  months with a chest x-ray.  The patient is told, any surgical issues, to contact us.   Salvatore Decent Dorris Fetch, M.D.  Electronically Signed   KMD/MEDQ  D:  06/14/2008  T:  06/15/2008  Job:  [id number]   cc:   Pierce Crane, MD  Dellis Anes. Idell Pickles, M.D.

## 2011-05-04 NOTE — Consult Note (Signed)
NAME:  [name], [name] NO.:  [id number]   MEDICAL RECORD NO.:  [id number]          PATIENT TYPE:  INP   LOCATION:  1434                         FACILITY:  [facility]   PHYSICIAN:  Jordan Hawks. Elnoria Howard, MD    DATE OF BIRTH:  Feb 04, 1946   DATE OF CONSULTATION:  08/14/2009  DATE OF DISCHARGE:  08/14/2009                                 CONSULTATION   REASON FOR CONSULTATION:  Right-sided colitis.   PRIMARY CARE Gaetano Romberger:  Dr. Catha Gosselin.   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  The patient states that, over  the weekend when he was playing in a golf tournament, he started to have 63 abdominal pain.  The pain did subside and he was able to finish the golf  tournament.  However, when the pain returned, he felt that he required  further evaluation.  The pain was associated with diarrhea and he is  concerned if he had some hematochezia, although at this time, he does  report having melenic stools.  When he presented to his primary care  Tyyonna Soucy he was noted to have an elevation in his white blood cell count  to 15.1.  Subsequently, a CT scan was ordered and he was found to have a  right-sided colitis.  Additionally, he also was noted to have a sodium  of 113 and that precipitated his admission to the hospital.  The patient  was previously evaluated by Dr. Loreta Ave in the past with a colonoscopy 3  years ago, which was essentially negative for any abnormalities, and  performed for routine purposes.  The patient denies using any recent  antibiotics.  However, he did visit a relative in [place] at a  nursing home.  The patient denies knowing any other family or friends  with diarrheal illnesses.  Currently, he is feeling better, but  abdominal pain and diarrhea are  still persistent problems for him.   PAST MEDICAL/SURGICAL HISTORY:  As stated above.   FAMILY HISTORY:  Noncontributory.   SOCIAL HISTORY:  Negative for tobacco, illicit drug use, positive for  EtOH.   REVIEW OF SYSTEMS:  As stated above in history of present illness.  Otherwise, negative.   MEDICATIONS:  1. Amlodipine 5 mg p.o. daily.  2. Lipitor 20 mg p.o. daily.  3. Ciprofloxacin 400 mg IV q.12 hours.  4. Xalatan 1 ophthalmologic drop nightly.  5. Multivitamin.  6. Flagyl 500 IV q.8 hours.  7. Protonix 20 mg p.o. daily.  8. Coumadin 5 mg p.o. daily.  9. Morphine 2 to 4 mg IV q.4 hours p.r.n.  10.Zofran 4 mg IV q.4 hours p.r.n.   ALLERGIES:  No known drug allergies.   PHYSICAL EXAMINATION:  VITAL SIGNS:  Blood pressure is 125/76, heart  rate is 88, respirations 15, temperature is 98.6.  GENERAL:  The patient is in no acute distress.  Alert and oriented.  HEENT:  Normocephalic, atraumatic.  Extraocular muscles intact.  NECK:  Supple.  No lymphadenopathy.  LUNGS:  Clear to auscultation bilaterally.  CARDIOVASCULAR:  Regular rate and rhythm.  ABDOMEN:  Obese, soft.  Tender in the right side.  No rebound or  rigidity.  Positive bowel sounds.  EXTREMITIES:  No cyanosis, clubbing, or edema.   LABORATORY VALUES:  White blood cell count 9.0, hemoglobin 12.9,  platelets 218,000.  PT 27.9, INR 2.6, sodium 133, potassium 3.3,  chloride 99, PO2 99, CO2 29, glucose 102, BUN 4, creatinine 0.7.   IMPRESSION:  1. Right-sided colitis.  2. Diarrhea secondary to colitis.  3. Abdominal pain.  4. Prior history of lung cancer.   PLAN:  Further evaluation with a colonoscopy is warranted at this time.  This could be an infectious, inflammatory, or ischemic etiology.  I am  aware that the patient is on Coumadin at 2.5 and I feel he should be  safe to undergo the procedure and small biopsies can be obtained.  It is  clear that he does have an ischemia, and there are most likely some   ulcerations, erosions, and his hemoglobin is currently stable at this  time while he is on Coumadin.  Therefore, a biopsy can be performed  safely.  Unfortunately, he has been started on antibiotics, and I am  uncertain if his stool was collected before the institution of the  antibiotics.  Plan is for the colonoscopy tomorrow and further  recommendations pending the findings.      Jordan Hawks Elnoria Howard, MD  Electronically Signed     PDH/MEDQ  D:  08/14/2009  T:  08/14/2009  Job:  [id number]   cc:   Caryn Bee L. Little, M.D.  Fax: [phone]

## 2011-05-04 NOTE — Assessment & Plan Note (Signed)
OFFICE VISIT   [name], [name]  DOB:  Sep 25, 1946                                        December 02, 2008  CHART #:  [id number]   The patient is a 65 year old gentleman who had a left upper lobectomy in  April 2005 for a T3 N0 non-small cell carcinoma.  He has been without  recurrent disease since that time.  He now returns for followup.  He is  4-1/2 years out from his resection.  He is also followed by Dr. Pierce Crane and Dr. Foye Deer.  The patient states that from a lung  standpoint, he did not have any difficulty with his breathing.  His  weight has been stable.  He has not noticed any lumps or bumps anywhere.  No new headaches or visual changes.  He does however complain that he  dropped a sheet of plywood on his left foot and has a swollen painful  big toe.   PHYSICAL EXAMINATION:  GENERAL:  The patient is a 65 year old white  male, in no acute distress.  VITAL SIGNS:  His blood pressure is 111/69, pulse 93, respirations 18,  and his ox saturation is 96% on room air.  LUNGS:  Clear with equal breath sounds.  CARDIAC:  Regular rate and rhythm.  Normal S1 and S2.  NECK:  There is no cervical, subclavicular, axillary, or epitrochlear  adenopathy.  EXTREMITIES:  His left foot has marked ecchymosis and swelling in the  area of the metatarsal head and then metatarsophalangeal joint of the  first toe.   IMPRESSION:  The patient is doing very well at this point in time.  His  chest x-ray today shows no evidence of recurrent disease.  Clinically,  he is doing well.  He is 4-1/2 years out from left upper lobectomy,  definitely, he is going to get an another CT in April, which will be the  5-year anniversary.  I will plan to see him back at that time after he  has had that scan.  In the meantime in regards to his toe, I am going to  send him down for some x-rays to make sure that is not fractured.  He  really has very impressive swelling and  ecchymosis in that area.  If it  is a fracture,  we will see about setting him up to see an orthopedist up again.  I will  plan to see the patient back in April.   Salvatore Decent Dorris Fetch, M.D.  Electronically Signed   SCH/MEDQ  D:  12/02/2008  T:  12/02/2008  Job:  [id number]   cc:   Pierce Crane, MD  Dellis Anes. Idell Pickles, M.D.

## 2011-05-07 NOTE — Op Note (Signed)
NAME:  [name], [name]   MEDICAL RECORD NO.:  [id number]                   PATIENT TYPE:  OIB   LOCATION:  2870                                 FACILITY:  [facility]   PHYSICIAN:  Salvatore Decent. Dorris Fetch, M.D.         DATE OF BIRTH:  02-25-1946   DATE OF PROCEDURE:  08/12/2004  DATE OF DISCHARGE:                                 OPERATIVE REPORT   PREOPERATIVE DIAGNOSIS:  Rule out recurrent lung cancer.   POSTOPERATIVE DIAGNOSIS:  Rule out recurrent lung cancer.   OPERATION/PROCEDURE:  Flexible fiberoptic bronchoscopy with Wang needle  biopsy.   SURGEON:  Salvatore Decent. Dorris Fetch, M.D.   ANESTHESIA:  General.   FINDINGS:  1. Mild erythema at left upper lobe bronchial stump.  2. No tumor seen.   CLINICAL NOTE:  [name] is a 66 year old gentleman with a history of a  left upper lobectomy for a T3N0 non-small-cell carcinoma.  He had been  treated with adjuvant chemotherapy and radiation.  On a post treatment chest  CT scan he had soft tissue noted around the left upper lobe bronchial stump.  This was mildly positive by PET scan raising suspicion for recurrent tumor.  The patient was advised to undergo flexible fiberoptic bronchoscopy with  Wang needle biopsy to evaluate the process.   DESCRIPTION OF PROCEDURE:  [name] was brought to the preoperative holding  area on August 12, 2004.  There IV access was obtained.  The patient then  was taken to the operating room, anesthetized and intubated.  He had general  anesthesia at his own request due to problems with gagging during previous  endoscopic procedures.  The patient tolerated induction of anesthesia  smoothly without any difficulties.   Flexible fiberoptic bronchoscopy was performed via the endobronchial tube.  The distal trachea and carina were normal as was the right bronchial tree to  the level of the subsegmental bronchi.  The left main stem bronchus was  normal in appearance  as was the left lower lobe bronchus to the level of the  subsegmental bronchi.  The left upper lobe stump showed a good tissue  approximation.  There was some mild erythema of the stump.  No bronchial  fistula was seen nor was any tumor seen.   Wang needle biopsy then was performed passing the needle through the left  upper lobe bronchial stump.  Three passes were performed.  Tissue was  obtained on all three passes and will be sent for permanent section.  On the  third pass, a small amount of blood also was aspirated.   The needle was withdrawn.  The site was irrigated with saline.  No ongoing  bleeding was noted.  The bronchoscope was withdrawn.  The patient was  extubated in the operating room and taken to the recovery room in stable  condition.  Salvatore Decent Dorris Fetch, M.D.    SCH/MEDQ  D:  08/12/2004  T:  08/12/2004  Job:  [id number]   cc:   Shan Levans, M.D. [facility]   Pierce Crane, M.D.  [address] [facility]  [place]  [place] [address]  Fax: [phone]   Wynn Banker, M.D.  [address] [facility]  [place]  [place] [address]  Fax: [phone]

## 2011-06-04 ENCOUNTER — Other Ambulatory Visit: Payer: Self-pay | Admitting: Oncology

## 2011-06-04 ENCOUNTER — Encounter (HOSPITAL_BASED_OUTPATIENT_CLINIC_OR_DEPARTMENT_OTHER): Payer: 59 | Admitting: Oncology

## 2011-06-04 DIAGNOSIS — Z7901 Long term (current) use of anticoagulants: Secondary | ICD-10-CM

## 2011-06-04 DIAGNOSIS — Z85118 Personal history of other malignant neoplasm of bronchus and lung: Secondary | ICD-10-CM

## 2011-06-04 DIAGNOSIS — Z86718 Personal history of other venous thrombosis and embolism: Secondary | ICD-10-CM

## 2011-06-04 LAB — PROTIME-INR: INR: 3.3 (ref 2.00–3.50)

## 2011-06-22 ENCOUNTER — Other Ambulatory Visit: Payer: Self-pay | Admitting: Ophthalmology

## 2011-07-01 ENCOUNTER — Other Ambulatory Visit: Payer: Self-pay | Admitting: Ophthalmology

## 2011-07-09 ENCOUNTER — Other Ambulatory Visit: Payer: Self-pay | Admitting: Oncology

## 2011-07-09 ENCOUNTER — Encounter (HOSPITAL_BASED_OUTPATIENT_CLINIC_OR_DEPARTMENT_OTHER): Payer: 59 | Admitting: Oncology

## 2011-07-09 DIAGNOSIS — I749 Embolism and thrombosis of unspecified artery: Secondary | ICD-10-CM

## 2011-07-09 LAB — PROTIME-INR

## 2011-08-06 ENCOUNTER — Encounter (HOSPITAL_BASED_OUTPATIENT_CLINIC_OR_DEPARTMENT_OTHER): Payer: 59 | Admitting: Oncology

## 2011-08-06 ENCOUNTER — Other Ambulatory Visit: Payer: Self-pay | Admitting: Oncology

## 2011-08-06 DIAGNOSIS — Z7901 Long term (current) use of anticoagulants: Secondary | ICD-10-CM

## 2011-08-06 DIAGNOSIS — Z5181 Encounter for therapeutic drug level monitoring: Secondary | ICD-10-CM

## 2011-08-06 DIAGNOSIS — I749 Embolism and thrombosis of unspecified artery: Secondary | ICD-10-CM

## 2011-08-06 LAB — PROTIME-INR: Protime: 52.8 Seconds — ABNORMAL HIGH (ref 10.6–13.4)

## 2011-09-03 ENCOUNTER — Encounter (HOSPITAL_BASED_OUTPATIENT_CLINIC_OR_DEPARTMENT_OTHER): Payer: 59 | Admitting: Oncology

## 2011-09-03 ENCOUNTER — Ambulatory Visit (HOSPITAL_COMMUNITY)
Admission: RE | Admit: 2011-09-03 | Discharge: 2011-09-03 | Disposition: A | Discharge status: Home / Self Care | Payer: 59 | Source: Ambulatory Visit | Attending: Oncology | Admitting: Oncology

## 2011-09-03 ENCOUNTER — Other Ambulatory Visit: Payer: Self-pay | Admitting: Oncology

## 2011-09-03 DIAGNOSIS — R0789 Other chest pain: Secondary | ICD-10-CM | POA: Insufficient documentation

## 2011-09-03 DIAGNOSIS — Z7901 Long term (current) use of anticoagulants: Secondary | ICD-10-CM

## 2011-09-03 DIAGNOSIS — Z86718 Personal history of other venous thrombosis and embolism: Secondary | ICD-10-CM

## 2011-09-03 DIAGNOSIS — N63 Unspecified lump in unspecified breast: Secondary | ICD-10-CM

## 2011-09-03 DIAGNOSIS — Z85118 Personal history of other malignant neoplasm of bronchus and lung: Secondary | ICD-10-CM | POA: Insufficient documentation

## 2011-09-03 LAB — COMPREHENSIVE METABOLIC PANEL
ALT: 38 U/L (ref 0–53)
CO2: 26 mEq/L (ref 19–32)
Calcium: 9.6 mg/dL (ref 8.4–10.5)
Chloride: 99 mEq/L (ref 96–112)
Potassium: 3.8 mEq/L (ref 3.5–5.3)
Sodium: 137 mEq/L (ref 135–145)
Total Protein: 7.4 g/dL (ref 6.0–8.3)

## 2011-09-03 LAB — PROTIME-INR: Protime: 38.4 Seconds — ABNORMAL HIGH (ref 10.6–13.4)

## 2011-09-03 LAB — LACTATE DEHYDROGENASE: LDH: 144 U/L (ref 94–250)

## 2011-09-03 LAB — CBC WITH DIFFERENTIAL/PLATELET
BASO%: 0.9 % (ref 0.0–2.0)
HCT: 41.6 % (ref 38.4–49.9)
MCHC: 34 g/dL (ref 32.0–36.0)
MONO#: 0.8 10*3/uL (ref 0.1–0.9)
RBC: 4.3 10*6/uL (ref 4.20–5.82)
WBC: 7.3 10*3/uL (ref 4.0–10.3)
lymph#: 1.4 10*3/uL (ref 0.9–3.3)

## 2011-09-03 LAB — CEA: CEA: 1.1 ng/mL (ref 0.0–5.0)

## 2011-09-10 ENCOUNTER — Ambulatory Visit
Admission: RE | Admit: 2011-09-10 | Discharge: 2011-09-10 | Disposition: A | Discharge status: Home / Self Care | Payer: 59 | Source: Ambulatory Visit | Attending: Oncology | Admitting: Oncology

## 2011-09-10 DIAGNOSIS — N63 Unspecified lump in unspecified breast: Secondary | ICD-10-CM

## 2011-09-27 LAB — COMPREHENSIVE METABOLIC PANEL
ALT: 43
AST: 34
Albumin: 3.9
Alkaline Phosphatase: 90
Calcium: 9.1
GFR calc Af Amer: >60
Glucose, Bld: 104 — ABNORMAL HIGH
Potassium: 4.1
Sodium: 137
Total Protein: 6.7

## 2011-09-27 LAB — CBC
MCHC: 35.2
Platelets: 280
RDW: 14

## 2011-09-27 LAB — PROTIME-INR: Prothrombin Time: 24.8 — ABNORMAL HIGH

## 2011-10-08 ENCOUNTER — Encounter (HOSPITAL_BASED_OUTPATIENT_CLINIC_OR_DEPARTMENT_OTHER): Payer: 59 | Admitting: Oncology

## 2011-10-08 ENCOUNTER — Other Ambulatory Visit: Payer: Self-pay | Admitting: Oncology

## 2011-10-08 DIAGNOSIS — Z7901 Long term (current) use of anticoagulants: Secondary | ICD-10-CM

## 2011-10-08 DIAGNOSIS — Z86718 Personal history of other venous thrombosis and embolism: Secondary | ICD-10-CM

## 2011-10-08 LAB — PROTIME-INR
INR: 3.9 — ABNORMAL HIGH (ref 2.00–3.50)
Protime: 46.8 Seconds — ABNORMAL HIGH (ref 10.6–13.4)

## 2011-11-12 ENCOUNTER — Other Ambulatory Visit (HOSPITAL_BASED_OUTPATIENT_CLINIC_OR_DEPARTMENT_OTHER): Payer: 59 | Admitting: Lab

## 2011-11-12 ENCOUNTER — Other Ambulatory Visit: Payer: Self-pay | Admitting: Oncology

## 2011-11-12 DIAGNOSIS — Z7901 Long term (current) use of anticoagulants: Secondary | ICD-10-CM

## 2011-11-12 DIAGNOSIS — I749 Embolism and thrombosis of unspecified artery: Secondary | ICD-10-CM

## 2011-11-12 DIAGNOSIS — Z5181 Encounter for therapeutic drug level monitoring: Secondary | ICD-10-CM

## 2011-11-22 ENCOUNTER — Encounter: Payer: Self-pay | Admitting: *Deleted

## 2011-11-22 ENCOUNTER — Other Ambulatory Visit: Payer: Self-pay | Admitting: *Deleted

## 2011-11-22 DIAGNOSIS — I749 Embolism and thrombosis of unspecified artery: Secondary | ICD-10-CM

## 2011-11-22 MED ORDER — WARFARIN SODIUM 5 MG PO TABS: 5.0000 mg | ORAL_TABLET | ORAL | Status: DC

## 2011-12-17 ENCOUNTER — Other Ambulatory Visit (HOSPITAL_BASED_OUTPATIENT_CLINIC_OR_DEPARTMENT_OTHER): Payer: 59 | Admitting: Lab

## 2011-12-17 ENCOUNTER — Other Ambulatory Visit: Payer: Self-pay | Admitting: Oncology

## 2011-12-17 DIAGNOSIS — Z7901 Long term (current) use of anticoagulants: Secondary | ICD-10-CM

## 2011-12-17 DIAGNOSIS — Z86718 Personal history of other venous thrombosis and embolism: Secondary | ICD-10-CM

## 2011-12-17 LAB — PROTIME-INR

## 2012-01-22 ENCOUNTER — Telehealth: Payer: Self-pay | Admitting: Oncology

## 2012-01-22 NOTE — Telephone Encounter (Signed)
called pt and scheduled appt for march2013 °

## 2012-02-08 ENCOUNTER — Ambulatory Visit (HOSPITAL_BASED_OUTPATIENT_CLINIC_OR_DEPARTMENT_OTHER): Payer: 59 | Admitting: Lab

## 2012-02-08 ENCOUNTER — Encounter: Payer: Self-pay | Admitting: *Deleted

## 2012-02-08 ENCOUNTER — Other Ambulatory Visit: Payer: 59 | Admitting: Lab

## 2012-02-08 DIAGNOSIS — C3490 Malignant neoplasm of unspecified part of unspecified bronchus or lung: Secondary | ICD-10-CM

## 2012-02-08 DIAGNOSIS — I82409 Acute embolism and thrombosis of unspecified deep veins of unspecified lower extremity: Secondary | ICD-10-CM

## 2012-02-08 LAB — CBC WITH DIFFERENTIAL/PLATELET
BASO%: 0.8 % (ref 0.0–2.0)
Basophils Absolute: 0.1 10*3/uL (ref 0.0–0.1)
HCT: 40.3 % (ref 38.4–49.9)
HGB: 14.3 g/dL (ref 13.0–17.1)
MONO#: 0.7 10*3/uL (ref 0.1–0.9)
NEUT%: 70.7 % (ref 39.0–75.0)
WBC: 7.7 10*3/uL (ref 4.0–10.3)
lymph#: 1.4 10*3/uL (ref 0.9–3.3)

## 2012-02-08 LAB — PROTIME-INR

## 2012-02-09 ENCOUNTER — Telehealth: Payer: Self-pay | Admitting: Oncology

## 2012-02-09 NOTE — Telephone Encounter (Signed)
Pt came in today to r/s 3/22 f/u appt to 4/11. Pt given new schedule for April.

## 2012-03-07 ENCOUNTER — Ambulatory Visit: Payer: 59 | Admitting: Oncology

## 2012-03-10 ENCOUNTER — Ambulatory Visit: Payer: 59 | Admitting: Oncology

## 2012-03-30 ENCOUNTER — Ambulatory Visit (HOSPITAL_BASED_OUTPATIENT_CLINIC_OR_DEPARTMENT_OTHER): Payer: 59 | Admitting: Oncology

## 2012-03-30 ENCOUNTER — Ambulatory Visit (HOSPITAL_COMMUNITY)
Admission: RE | Admit: 2012-03-30 | Discharge: 2012-03-30 | Disposition: A | Discharge status: Home / Self Care | Payer: 59 | Source: Ambulatory Visit | Attending: Oncology | Admitting: Oncology

## 2012-03-30 ENCOUNTER — Ambulatory Visit (HOSPITAL_BASED_OUTPATIENT_CLINIC_OR_DEPARTMENT_OTHER): Payer: 59 | Admitting: Lab

## 2012-03-30 ENCOUNTER — Telehealth: Payer: Self-pay | Admitting: *Deleted

## 2012-03-30 VITALS — BP 94/59 | HR 134 | Temp 99.2°F | Ht 68.0 in | Wt 205.6 lb

## 2012-03-30 DIAGNOSIS — C3490 Malignant neoplasm of unspecified part of unspecified bronchus or lung: Secondary | ICD-10-CM

## 2012-03-30 DIAGNOSIS — Z7901 Long term (current) use of anticoagulants: Secondary | ICD-10-CM

## 2012-03-30 DIAGNOSIS — Z86718 Personal history of other venous thrombosis and embolism: Secondary | ICD-10-CM

## 2012-03-30 DIAGNOSIS — Z85118 Personal history of other malignant neoplasm of bronchus and lung: Secondary | ICD-10-CM | POA: Insufficient documentation

## 2012-03-30 LAB — COMPREHENSIVE METABOLIC PANEL
ALT: 50 U/L (ref 0–53)
Albumin: 4 g/dL (ref 3.5–5.2)
CO2: 31 mEq/L (ref 19–32)
Calcium: 10 mg/dL (ref 8.4–10.5)
Chloride: 93 mEq/L — ABNORMAL LOW (ref 96–112)
Creatinine, Ser: 1.53 mg/dL — ABNORMAL HIGH (ref 0.50–1.35)
Potassium: 3.6 mEq/L (ref 3.5–5.3)
Total Protein: 7.3 g/dL (ref 6.0–8.3)

## 2012-03-30 LAB — CBC WITH DIFFERENTIAL/PLATELET
BASO%: 0.4 % (ref 0.0–2.0)
HCT: 41.4 % (ref 38.4–49.9)
HGB: 14.2 g/dL (ref 13.0–17.1)
MCHC: 34.3 g/dL (ref 32.0–36.0)
MONO#: 0.7 10*3/uL (ref 0.1–0.9)
NEUT%: 75 % (ref 39.0–75.0)
RDW: 13.7 % (ref 11.0–14.6)
WBC: 9 10*3/uL (ref 4.0–10.3)
lymph#: 1.4 10*3/uL (ref 0.9–3.3)

## 2012-03-30 LAB — LACTATE DEHYDROGENASE: LDH: 203 U/L (ref 94–250)

## 2012-03-30 NOTE — Telephone Encounter (Signed)
placed patient on the walk in list for 03-30-2012 gave patient instructions for chest x-ray

## 2012-03-30 NOTE — Progress Notes (Signed)
Hematology and Oncology Follow Up Visit  [name] [id number] 05/22/1946 66 y.o. 03/30/2012 3:14 PM PCP Dr Vanessa Kick Little 1. 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  Completed all therapy in November 2005 and annual follow-up.   History of left upper extremity deep vein thrombosis, status post stent placement, chronic Coumadin  Interim History:  There have been no intercurrent illness, hospitalizations or medication changes.  Medications: I have reviewed the patient's current medications.  Allergies: No Known Allergies  Past Medical History, Surgical history, Social history, and Family History were reviewed and updated.  Review of Systems: Constitutional:  Negative for fever, chills, night sweats, anorexia, weight loss, pain. Cardiovascular: negative Respiratory: no cough, shortness of breath, or wheezing Neurological: negative Dermatological: negative ENT: negative Skin Gastrointestinal: negative Genito-Urinary: negative Hematological and Lymphatic: negative Breast: negative Musculoskeletal: negative Remaining ROS negative.  Physical Exam: Blood pressure 94/59, pulse 134, temperature 99.2 F (37.3 C), temperature source Oral, height 5\' 8"  (1.727 m), weight 205 lb 9.6 oz (93.26 kg). ECOG: 0 HEENT:  Sclerae anicteric, conjunctivae pink.  Oropharynx clear.  No mucositis or candidiasis.  Nodes:  No cervical, supraclavicular, or axillary lymphadenopathy palpated.    No masses, discharge, skin change, or nipple inversion.. Noted. Previous mammogram 9/12- wnl. Small amount of gynecomastia  Lungs:  Clear to auscultation bilaterally.  No crackles, rhonchi, or wheezes.  Heart:  Regular rate and rhythm.  Abdomen:  Soft, nontender.  Positive bowel sounds.  No organomegaly or masses palpated.  Musculoskeletal:  No focal spinal tenderness to palpation.   Extremities:  Benign.  No peripheral edema or cyanosis.  Skin:  Benign.  Neuro:  Nonfocal.   Lab Results: Lab Results  Component Value Date   WBC 7.7 02/08/2012   HGB 14.3 02/08/2012   HCT 40.3 02/08/2012   MCV 91.6 02/08/2012   PLT 250 02/08/2012     Chemistry      Component Value Date/Time   NA 137 09/03/2011 1019   K 3.8 09/03/2011 1019   CL 99 09/03/2011 1019   CO2 26 09/03/2011 1019   BUN 13 09/03/2011 1019   CREATININE 0.95 09/03/2011 1019      Component Value Date/Time   CALCIUM 9.6 09/03/2011 1019   ALKPHOS 93 09/03/2011 1019   AST 32 09/03/2011 1019   ALT 38 09/03/2011 1019   BILITOT 0.4 09/03/2011 1019      .pathology. Radiological Studies: chest X-ray/CT 4/13-wnl  Mammogram 2012-nl Bone density n/a  Impression and Plan: Hx T3N0 squamous cell ca, s/p xrt/ combined chemotherapy, completed 2005-NED. Hx of lt UE DVT on chronic coumadin , s/p stent.  We will continue to see q 6 months. Coumadin clinic f/u.  More than 50% of the visit was spent in patient-related counselling   Pierce Crane, MD 4/11/20133:14 PM

## 2012-03-30 NOTE — Patient Instructions (Addendum)
  DISCARGE INSTRUCTIONS  F/U WITH DR LITTLE:  ANNUAL CHEST XRAY CONTINUE TO MONITOR PT EVERY 2 MONTHS ; MAINTAIN INR 3-4 USUAL HEALTHCARE MAINTENANCE REPORT ANY SYMPTOMS IE SHORTNESS OF BREATH, CHEST PAIN, BLEEDING OR BRUISING AVOID BLOOD DRAWS IN LT ARM REDUCE ALCOHOL CONSUMPTION

## 2012-03-31 ENCOUNTER — Other Ambulatory Visit: Payer: Self-pay | Admitting: Oncology

## 2012-03-31 DIAGNOSIS — C3490 Malignant neoplasm of unspecified part of unspecified bronchus or lung: Secondary | ICD-10-CM

## 2012-04-04 ENCOUNTER — Ambulatory Visit (HOSPITAL_BASED_OUTPATIENT_CLINIC_OR_DEPARTMENT_OTHER): Payer: 59 | Admitting: Lab

## 2012-04-04 ENCOUNTER — Other Ambulatory Visit (HOSPITAL_COMMUNITY): Payer: 59

## 2012-04-04 DIAGNOSIS — C3490 Malignant neoplasm of unspecified part of unspecified bronchus or lung: Secondary | ICD-10-CM

## 2012-04-04 LAB — COMPREHENSIVE METABOLIC PANEL
Alkaline Phosphatase: 107 U/L (ref 39–117)
BUN: 10 mg/dL (ref 6–23)
Creatinine, Ser: 0.87 mg/dL (ref 0.50–1.35)
Glucose, Bld: 86 mg/dL (ref 70–99)
Sodium: 139 mEq/L (ref 135–145)
Total Bilirubin: 0.4 mg/dL (ref 0.3–1.2)

## 2012-04-05 ENCOUNTER — Ambulatory Visit (HOSPITAL_COMMUNITY)
Admission: RE | Admit: 2012-04-05 | Discharge: 2012-04-05 | Disposition: A | Discharge status: Home / Self Care | Payer: 59 | Source: Ambulatory Visit | Attending: Oncology | Admitting: Oncology

## 2012-04-05 DIAGNOSIS — Z902 Acquired absence of lung [part of]: Secondary | ICD-10-CM | POA: Insufficient documentation

## 2012-04-05 DIAGNOSIS — N62 Hypertrophy of breast: Secondary | ICD-10-CM | POA: Insufficient documentation

## 2012-04-05 DIAGNOSIS — Z09 Encounter for follow-up examination after completed treatment for conditions other than malignant neoplasm: Secondary | ICD-10-CM | POA: Insufficient documentation

## 2012-04-05 DIAGNOSIS — K7689 Other specified diseases of liver: Secondary | ICD-10-CM | POA: Insufficient documentation

## 2012-04-05 DIAGNOSIS — J438 Other emphysema: Secondary | ICD-10-CM | POA: Insufficient documentation

## 2012-04-05 DIAGNOSIS — Z85118 Personal history of other malignant neoplasm of bronchus and lung: Secondary | ICD-10-CM | POA: Insufficient documentation

## 2012-04-05 MED ORDER — IOHEXOL 300 MG/ML  SOLN
80.0000 mL | Freq: Once | INTRAMUSCULAR | Status: AC | PRN
  Administered 2012-04-05: 80 mL via INTRAVENOUS

## 2012-04-06 ENCOUNTER — Encounter: Payer: Self-pay | Admitting: Oncology

## 2012-04-07 ENCOUNTER — Other Ambulatory Visit: Payer: Self-pay

## 2012-04-07 ENCOUNTER — Telehealth: Payer: Self-pay

## 2012-04-07 NOTE — Telephone Encounter (Signed)
Pt notified of CT results per Dr Donnie Coffin - "no evidence of recurrent or metastatic disease." pt verbalizes understanding. dph

## 2012-07-13 ENCOUNTER — Other Ambulatory Visit: Payer: Self-pay | Admitting: Oncology

## 2012-07-14 ENCOUNTER — Other Ambulatory Visit: Payer: Self-pay | Admitting: Emergency Medicine

## 2013-02-22 ENCOUNTER — Other Ambulatory Visit: Payer: Self-pay | Admitting: Dermatology

## 2014-09-26 ENCOUNTER — Encounter: Payer: Self-pay | Admitting: Neurology

## 2014-09-26 ENCOUNTER — Encounter (INDEPENDENT_AMBULATORY_CARE_PROVIDER_SITE_OTHER): Payer: Self-pay

## 2014-09-26 ENCOUNTER — Ambulatory Visit (INDEPENDENT_AMBULATORY_CARE_PROVIDER_SITE_OTHER): Payer: Medicare Other | Admitting: Neurology

## 2014-09-26 VITALS — BP 116/80 | HR 130 | Ht 69.0 in | Wt 214.6 lb

## 2014-09-26 DIAGNOSIS — G609 Hereditary and idiopathic neuropathy, unspecified: Secondary | ICD-10-CM

## 2014-09-26 HISTORY — DX: Hereditary and idiopathic neuropathy, unspecified: G60.9

## 2014-09-26 NOTE — Progress Notes (Signed)
Reason for visit: Peripheral neuropathy  [name] is a 68 y.o. male  History of present illness:  [name] is a 68 year old right-handed white male with a history of a peripheral neuropathy with symptoms dating back to April of 2012. The patient was seen by Dr. Tish Frederickson through this practice, and EMG and nerve conduction study was not performed. The patient has had ongoing symptoms since that time, and he indicates that the numbness has gradually worsened in the feet, and he is having some tingling sensations in the fingers as well. The patient does report mild gait instability, but he has not had any falls. The patient is having numbness only, without burning, stinging, or lancinating pains. He denies any back pain or neck discomfort or pain down the arms or legs. He denies issues controlling the bowels or the bladder. He indicates that he drinks 4 or 5 alcoholic beverages each night, and he drinks vodka. The patient sleeps well at night without symptoms of pain or restless leg syndrome. He is sent to this office for further evaluation. The patient is concerned that his statin medication for his cholesterol is the etiology of his neuropathy.  Past Medical History  Diagnosis Date  . Hypertension   . Hyperlipidemia   . Polio   . Hereditary and idiopathic peripheral neuropathy 09/26/2014  . Cancer     lung cancer  . Glaucoma   . Left subclavian vein thrombosis   . Ischemic colitis     Past Surgical History  Procedure Laterality Date  . Hernia repair      left ingunal  . Lung removal, partial    . Tonsillectomy      Family History  Problem Relation Age of Onset  . Cancer Mother     intestine  . Hyperlipidemia Mother   . Heart Problems Brother   . Heart attack Father     Social history:  reports that he has quit smoking. His smoking use included Cigarettes. He has a 80 pack-year smoking history. He has never used smokeless tobacco. He reports that he drinks alcohol. He  reports that he does not use illicit drugs.  Medications:  Current Outpatient Prescriptions on File Prior to Visit  Medication Sig Dispense Refill  . warfarin (COUMADIN) 5 MG tablet Take 1 tablet (5 mg total) by mouth as directed.  125 tablet  4   No current facility-administered medications on file prior to visit.     No Known Allergies  ROS:  Out of a complete 14 system review of symptoms, the patient complains only of the following symptoms, and all other reviewed systems are negative.  Runny nose Leg swelling Snoring Numbness  Blood pressure 116/80, pulse 130, height 5\' 9"  (1.753 m), weight 214 lb 9.6 oz (97.342 kg).  Physical Exam  General: The patient is alert and cooperative at the time of the examination.  Eyes: Pupils are equal, round, and reactive to light. Discs are flat bilaterally.  Neck: The neck is supple, no carotid bruits are noted.  Respiratory: The respiratory examination is clear.  Cardiovascular: The cardiovascular examination reveals a regular rate and rhythm, no obvious murmurs or rubs are noted.  Skin: Extremities are with 1+ edema below the knees bilaterally.  Neurologic Exam  Mental status: The patient is alert and oriented x 3 at the time of the examination. The patient has apparent normal recent and remote memory, with an apparently normal attention span and concentration ability.  Cranial nerves: Facial symmetry  is present. There is good sensation of the face to pinprick and soft touch bilaterally. The strength of the facial muscles and the muscles to head turning and shoulder shrug are normal bilaterally. Speech is well enunciated, no aphasia or dysarthria is noted. Extraocular movements are full. Visual fields are full. The tongue is midline, and the patient has symmetric elevation of the soft palate. No obvious hearing deficits are noted.  Motor: The motor testing reveals 5 over 5 strength of all 4 extremities. Good symmetric motor tone is  noted throughout. The patient is able to walk on the toes bilaterally, unable to walk on heels bilaterally.  Sensory: Sensory testing is intact to pinprick, soft touch, vibration sensation, and position sense on the upper extremities. With the lower extremities, the patient has a stocking pattern pinprick sensory deficit to the knees bilaterally with moderate impairment of vibration sensation and position sensation in both feet. No evidence of extinction is noted.  Coordination: Cerebellar testing reveals good finger-nose-finger and heel-to-shin bilaterally.  Gait and station: Gait is slightly wide-based. Tandem gait is unsteady. The patient ambulates without a cane. Romberg is negative. No drift is seen.  Reflexes: Deep tendon reflexes are symmetric and normal bilaterally, with the exception that the ankle jerk reflexes are depressed to absent. Toes are downgoing bilaterally.   Assessment/Plan:  One. Peripheral neuropathy  2. Mild gait disorder  The patient does appear to have evidence of a peripheral neuropathy on the clinical examination. The patient reports drinking 4 or 5 alcoholic beverages each night, and this is the likely etiology of his peripheral neuropathy. Long duration daily use of alcohol is a common etiology of peripheral neuropathies. The patient was not on a statin medication at the time of onset of symptoms in April 2012. I have indicated that the treatment of his neuropathy is cessation of alcohol intake. The patient will be sent for further blood work. He has already had a vitamin B12 level, and he was told that this was normal. The patient will have nerve conduction studies on both legs, and one arm. He will have EMG evaluation of one leg. The patient is not having discomfort with the neuropathy, there is no indication for medical therapy. I have asked the patient go on multivitamins. He will followup for the EMG evaluation.  Jill Alexanders MD 09/26/2014 7:27 PM  [facility] [address] [place], [place] [address]  Phone [phone] Fax [phone]

## 2014-09-26 NOTE — Patient Instructions (Signed)

## 2014-09-30 ENCOUNTER — Telehealth: Payer: Self-pay | Admitting: Neurology

## 2014-09-30 LAB — IFE AND PE, SERUM
ALBUMIN SERPL ELPH-MCNC: 4.1 g/dL (ref 3.2–5.6)
ALBUMIN/GLOB SERPL: 1.5 (ref 0.7–2.0)
ALPHA 1: 0.2 g/dL (ref 0.1–0.4)
ALPHA2 GLOB SERPL ELPH-MCNC: 0.8 g/dL (ref 0.4–1.2)
B-GLOBULIN SERPL ELPH-MCNC: 0.9 g/dL (ref 0.6–1.3)
Gamma Glob SerPl Elph-Mcnc: 0.9 g/dL (ref 0.5–1.6)
Globulin, Total: 2.9 g/dL (ref 2.0–4.5)
IGG (IMMUNOGLOBIN G), SERUM: 985 mg/dL (ref 700–1600)
IgA/Immunoglobulin A, Serum: 237 mg/dL (ref 91–414)
IgM (Immunoglobulin M), Srm: 53 mg/dL (ref 40–230)
TOTAL PROTEIN: 7 g/dL (ref 6.0–8.5)

## 2014-09-30 LAB — COPPER, SERUM: COPPER: 115 ug/dL (ref 72–166)

## 2014-09-30 LAB — ANGIOTENSIN CONVERTING ENZYME: Angio Convert Enzyme: 44 U/L (ref 14–82)

## 2014-09-30 LAB — RHEUMATOID FACTOR: Rhuematoid fact SerPl-aCnc: 67.8 IU/mL — ABNORMAL HIGH (ref 0.0–13.9)

## 2014-09-30 LAB — ANA W/REFLEX: Anti Nuclear Antibody(ANA): NEGATIVE

## 2014-09-30 NOTE — Telephone Encounter (Signed)
I called patient. The blood work was unremarkable with exception that the rheumatoid factor was significantly elevated. If the patient is having symptoms of arthritis, joint swelling, will need to refer to a rheumatology doctor. Otherwise, this will need to be followed, may need to check a CRP and sedimentation rate in the future as well. I will send the blood work to the primary care physician.

## 2014-10-08 ENCOUNTER — Ambulatory Visit (INDEPENDENT_AMBULATORY_CARE_PROVIDER_SITE_OTHER): Payer: Medicare Other | Admitting: Neurology

## 2014-10-08 ENCOUNTER — Encounter (INDEPENDENT_AMBULATORY_CARE_PROVIDER_SITE_OTHER): Payer: Self-pay | Admitting: Radiology

## 2014-10-08 DIAGNOSIS — Z0289 Encounter for other administrative examinations: Secondary | ICD-10-CM

## 2014-10-08 DIAGNOSIS — G609 Hereditary and idiopathic neuropathy, unspecified: Secondary | ICD-10-CM

## 2014-10-08 NOTE — Procedures (Signed)
     HISTORY:  [name] is a 68 year old gentleman with a three-year history of numbness in the legs associated with a mild gait disorder. The patient has bilateral foot drops, and he is being evaluated for a possible peripheral neuropathy.  NERVE CONDUCTION STUDIES:  Nerve conduction studies were performed on the right upper extremity. The distal motor latencies and motor amplitudes for the median and ulnar nerves were within normal limits. The F wave latencies and nerve conduction velocities for these nerves were also normal. The sensory latencies for the median and radial nerves were normal. The ulnar sensory latency was unobtainable.  Nerve conduction studies were performed on both lower extremities. Studies of the peroneal and posterior tibial nerves bilaterally were unobtainable. The peroneal sensory latencies were unobtainable bilaterally.  EMG STUDIES:  EMG study was performed on the right lower extremity:  The tibialis anterior muscle reveals up to 8K motor units with moderately reduced recruitment. 3+ fibrillations and positive waves were seen. The peroneus tertius muscle reveals up to 10K motor units with markedly reduced recruitment. 3+ fibrillations and positive waves were seen. The medial gastrocnemius muscle reveals 2 to 5K motor units with decreased recruitment. 2+ fibrillations and positive waves were seen. The vastus lateralis muscle reveals 2 to 5K motor units with minimally decreased recruitment. No fibrillations or positive waves were seen. The iliopsoas muscle reveals 2 to 4K motor units with full recruitment. No fibrillations or positive waves were seen. The biceps femoris muscle (long head) reveals 2 to 4K motor units with full recruitment. No fibrillations or positive waves were seen. The lumbosacral paraspinal muscles were tested at 3 levels, and revealed no abnormalities of insertional activity at all 3 levels tested. There was good  relaxation.   IMPRESSION:  Nerve conduction studies done on the right upper extremity and both lower extremities shows findings consistent with a severe primarily axonal peripheral neuropathy. EMG evaluation of the right lower extremity is consistent with the diagnosis of peripheral neuropathy with severe acute and chronic distal denervation, without evidence of an overlying lumbosacral radiculopathy.  Jill Alexanders MD 10/08/2014 4:57 PM  [facility] [address] [place], [place] [address]  Phone [phone] Fax [phone]

## 2014-10-08 NOTE — Progress Notes (Signed)
[name] is a 68 year old gentleman with a history of numbness in the legs dating back about 3 years with a mild gait disturbance. The patient comes into the office today for EMG and nerve conduction study. Blood work has shown an elevated rheumatoid factor.  Nerve conduction study and EMG evaluation has shown evidence of a severe primarily axonal peripheral neuropathy. No evidence of a lumbosacral radiculopathy is seen.  The patient has had elevations in the rheumatoid factor. We will wait another 3-4 weeks, repeat the study and check a sedimentation rate, CRP, and P-ANCA. The patient has not had any symptoms of an inflammatory arthritis. The patient will followup in about 3 months.

## 2014-11-06 ENCOUNTER — Encounter: Payer: Self-pay | Admitting: Neurology

## 2014-11-06 ENCOUNTER — Telehealth: Payer: Self-pay | Admitting: Neurology

## 2014-11-06 DIAGNOSIS — G609 Hereditary and idiopathic neuropathy, unspecified: Secondary | ICD-10-CM

## 2014-11-06 NOTE — Telephone Encounter (Signed)
I called patient. We will have him come in and recheck blood work to check the rheumatoid factor, and evaluate for an underlying vasculitis. The patient has a significant peripheral neuropathy. We may consider a referral to a rheumatologist in the future.

## 2014-11-12 ENCOUNTER — Encounter: Payer: Self-pay | Admitting: Neurology

## 2014-11-27 ENCOUNTER — Other Ambulatory Visit: Payer: Self-pay | Admitting: Family Medicine

## 2014-11-27 DIAGNOSIS — C3490 Malignant neoplasm of unspecified part of unspecified bronchus or lung: Secondary | ICD-10-CM

## 2014-12-02 ENCOUNTER — Ambulatory Visit
Admission: RE | Admit: 2014-12-02 | Discharge: 2014-12-02 | Disposition: A | Discharge status: Home / Self Care | Payer: Medicare Other | Source: Ambulatory Visit | Attending: Family Medicine | Admitting: Family Medicine

## 2014-12-02 DIAGNOSIS — C3490 Malignant neoplasm of unspecified part of unspecified bronchus or lung: Secondary | ICD-10-CM

## 2014-12-02 MED ORDER — IOHEXOL 300 MG/ML  SOLN
75.0000 mL | Freq: Once | INTRAMUSCULAR | Status: AC | PRN
  Administered 2014-12-02: 75 mL via INTRAVENOUS

## 2015-02-06 ENCOUNTER — Other Ambulatory Visit: Payer: Self-pay | Admitting: Gastroenterology

## 2015-02-06 DIAGNOSIS — Z800 Family history of malignant neoplasm of digestive organs: Secondary | ICD-10-CM

## 2015-02-06 DIAGNOSIS — K5730 Diverticulosis of large intestine without perforation or abscess without bleeding: Secondary | ICD-10-CM

## 2015-02-13 ENCOUNTER — Other Ambulatory Visit: Payer: Self-pay

## 2015-03-12 ENCOUNTER — Ambulatory Visit
Admission: RE | Admit: 2015-03-12 | Discharge: 2015-03-12 | Disposition: A | Discharge status: Home / Self Care | Payer: Medicare Other | Source: Ambulatory Visit | Attending: Gastroenterology | Admitting: Gastroenterology

## 2015-03-12 DIAGNOSIS — K5730 Diverticulosis of large intestine without perforation or abscess without bleeding: Secondary | ICD-10-CM

## 2015-03-12 DIAGNOSIS — Z800 Family history of malignant neoplasm of digestive organs: Secondary | ICD-10-CM

## 2015-03-19 ENCOUNTER — Encounter (HOSPITAL_COMMUNITY): Payer: Self-pay | Admitting: Emergency Medicine

## 2015-03-19 ENCOUNTER — Inpatient Hospital Stay (HOSPITAL_COMMUNITY)
Admission: EM | Admit: 2015-03-19 | Discharge: 2015-03-23 | DRG: 897 | Disposition: A | Discharge status: Home / Self Care | Payer: Medicare Other | Attending: Family Medicine | Admitting: Family Medicine

## 2015-03-19 ENCOUNTER — Emergency Department (HOSPITAL_COMMUNITY): Payer: Medicare Other

## 2015-03-19 DIAGNOSIS — Z87891 Personal history of nicotine dependence: Secondary | ICD-10-CM | POA: Diagnosis not present

## 2015-03-19 DIAGNOSIS — R55 Syncope and collapse: Secondary | ICD-10-CM | POA: Diagnosis present

## 2015-03-19 DIAGNOSIS — E785 Hyperlipidemia, unspecified: Secondary | ICD-10-CM | POA: Diagnosis present

## 2015-03-19 DIAGNOSIS — Z7901 Long term (current) use of anticoagulants: Secondary | ICD-10-CM

## 2015-03-19 DIAGNOSIS — Z86718 Personal history of other venous thrombosis and embolism: Secondary | ICD-10-CM | POA: Diagnosis not present

## 2015-03-19 DIAGNOSIS — I429 Cardiomyopathy, unspecified: Secondary | ICD-10-CM | POA: Diagnosis present

## 2015-03-19 DIAGNOSIS — Z85118 Personal history of other malignant neoplasm of bronchus and lung: Secondary | ICD-10-CM

## 2015-03-19 DIAGNOSIS — Z923 Personal history of irradiation: Secondary | ICD-10-CM

## 2015-03-19 DIAGNOSIS — D509 Iron deficiency anemia, unspecified: Secondary | ICD-10-CM | POA: Diagnosis present

## 2015-03-19 DIAGNOSIS — D6832 Hemorrhagic disorder due to extrinsic circulating anticoagulants: Secondary | ICD-10-CM | POA: Diagnosis present

## 2015-03-19 DIAGNOSIS — D699 Hemorrhagic condition, unspecified: Secondary | ICD-10-CM

## 2015-03-19 DIAGNOSIS — F10930 Alcohol use, unspecified with withdrawal, uncomplicated: Secondary | ICD-10-CM

## 2015-03-19 DIAGNOSIS — K709 Alcoholic liver disease, unspecified: Secondary | ICD-10-CM | POA: Diagnosis present

## 2015-03-19 DIAGNOSIS — I872 Venous insufficiency (chronic) (peripheral): Secondary | ICD-10-CM | POA: Diagnosis present

## 2015-03-19 DIAGNOSIS — I959 Hypotension, unspecified: Secondary | ICD-10-CM | POA: Diagnosis present

## 2015-03-19 DIAGNOSIS — F10231 Alcohol dependence with withdrawal delirium: Principal | ICD-10-CM | POA: Diagnosis present

## 2015-03-19 DIAGNOSIS — E876 Hypokalemia: Secondary | ICD-10-CM | POA: Diagnosis present

## 2015-03-19 DIAGNOSIS — G629 Polyneuropathy, unspecified: Secondary | ICD-10-CM | POA: Diagnosis present

## 2015-03-19 DIAGNOSIS — F10230 Alcohol dependence with withdrawal, uncomplicated: Secondary | ICD-10-CM | POA: Diagnosis not present

## 2015-03-19 DIAGNOSIS — T45515A Adverse effect of anticoagulants, initial encounter: Secondary | ICD-10-CM | POA: Diagnosis present

## 2015-03-19 DIAGNOSIS — F10239 Alcohol dependence with withdrawal, unspecified: Secondary | ICD-10-CM | POA: Diagnosis not present

## 2015-03-19 DIAGNOSIS — G609 Hereditary and idiopathic neuropathy, unspecified: Secondary | ICD-10-CM | POA: Diagnosis present

## 2015-03-19 DIAGNOSIS — I10 Essential (primary) hypertension: Secondary | ICD-10-CM | POA: Diagnosis present

## 2015-03-19 DIAGNOSIS — F10939 Alcohol use, unspecified with withdrawal, unspecified: Secondary | ICD-10-CM | POA: Diagnosis present

## 2015-03-19 DIAGNOSIS — I509 Heart failure, unspecified: Secondary | ICD-10-CM | POA: Diagnosis not present

## 2015-03-19 LAB — CBC WITH DIFFERENTIAL/PLATELET
BASOS ABS: 0.1 10*3/uL (ref 0.0–0.1)
Basophils Relative: 1 % (ref 0–1)
EOS ABS: 0 10*3/uL (ref 0.0–0.7)
Eosinophils Relative: 0 % (ref 0–5)
HEMATOCRIT: 33.7 % — AB (ref 39.0–52.0)
HEMOGLOBIN: 10.9 g/dL — AB (ref 13.0–17.0)
LYMPHS ABS: 0.8 10*3/uL (ref 0.7–4.0)
Lymphocytes Relative: 11 % — ABNORMAL LOW (ref 12–46)
MCH: 25.1 pg — ABNORMAL LOW (ref 26.0–34.0)
MCHC: 32.3 g/dL (ref 30.0–36.0)
MCV: 77.6 fL — ABNORMAL LOW (ref 78.0–100.0)
MONOS PCT: 9 % (ref 3–12)
Monocytes Absolute: 0.7 10*3/uL (ref 0.1–1.0)
Neutro Abs: 5.9 10*3/uL (ref 1.7–7.7)
Neutrophils Relative %: 79 % — ABNORMAL HIGH (ref 43–77)
PLATELETS: 134 10*3/uL — AB (ref 150–400)
RBC: 4.34 MIL/uL (ref 4.22–5.81)
RDW: 18.3 % — AB (ref 11.5–15.5)
WBC: 7.5 10*3/uL (ref 4.0–10.5)

## 2015-03-19 LAB — COMPREHENSIVE METABOLIC PANEL
ALBUMIN: 3.9 g/dL (ref 3.5–5.2)
ALK PHOS: 161 U/L — AB (ref 39–117)
ALT: 73 U/L — AB (ref 0–53)
ANION GAP: 18 — AB (ref 5–15)
AST: 96 U/L — ABNORMAL HIGH (ref 0–37)
BUN: 8 mg/dL (ref 6–23)
CALCIUM: 9.7 mg/dL (ref 8.4–10.5)
CHLORIDE: 97 mmol/L (ref 96–112)
CO2: 23 mmol/L (ref 19–32)
CREATININE: 0.98 mg/dL (ref 0.50–1.35)
GFR calc Af Amer: >90 mL/min (ref >90)
GFR, EST NON AFRICAN AMERICAN: 83 mL/min — AB (ref >90)
GLUCOSE: 125 mg/dL — AB (ref 70–99)
Potassium: 3.1 mmol/L — ABNORMAL LOW (ref 3.5–5.1)
Sodium: 138 mmol/L (ref 135–145)
Total Bilirubin: 1.2 mg/dL (ref 0.3–1.2)
Total Protein: 6.9 g/dL (ref 6.0–8.3)

## 2015-03-19 LAB — PROTIME-INR
INR: 2.78 — ABNORMAL HIGH (ref 0.00–1.49)
Prothrombin Time: 29.6 seconds — ABNORMAL HIGH (ref 11.6–15.2)

## 2015-03-19 LAB — BRAIN NATRIURETIC PEPTIDE: B Natriuretic Peptide: 48.2 pg/mL (ref 0.0–100.0)

## 2015-03-19 LAB — ETHANOL: Alcohol, Ethyl (B): <5 mg/dL (ref 0–9)

## 2015-03-19 LAB — TROPONIN I

## 2015-03-19 MED ORDER — ACETAMINOPHEN 325 MG PO TABS
325.0000 mg | ORAL_TABLET | Freq: Once | ORAL | Status: AC
Start: 2015-03-19 — End: 2015-03-19
  Administered 2015-03-19: 325 mg via ORAL
  Filled 2015-03-19: qty 1

## 2015-03-19 MED ORDER — VITAMIN B-1 100 MG PO TABS
100.0000 mg | ORAL_TABLET | Freq: Every day | ORAL | Status: DC
  Administered 2015-03-20 – 2015-03-22 (×3): 100 mg via ORAL
  Filled 2015-03-19 (×3): qty 1

## 2015-03-19 MED ORDER — FOLIC ACID 1 MG PO TABS
1.0000 mg | ORAL_TABLET | Freq: Every day | ORAL | Status: DC
  Administered 2015-03-20 – 2015-03-23 (×4): 1 mg via ORAL
  Filled 2015-03-19 (×4): qty 1

## 2015-03-19 MED ORDER — ENOXAPARIN SODIUM 40 MG/0.4ML ~~LOC~~ SOLN
40.0000 mg | SUBCUTANEOUS | Status: DC
  Administered 2015-03-20: 40 mg via SUBCUTANEOUS
  Filled 2015-03-19: qty 0.4

## 2015-03-19 MED ORDER — SODIUM CHLORIDE 0.9 % IV SOLN
INTRAVENOUS | Status: DC
  Administered 2015-03-19 – 2015-03-22 (×4): via INTRAVENOUS

## 2015-03-19 MED ORDER — ONDANSETRON HCL 4 MG/2ML IJ SOLN: 4.0000 mg | Freq: Four times a day (QID) | INTRAMUSCULAR | Status: DC | PRN

## 2015-03-19 MED ORDER — SODIUM CHLORIDE 0.9 % IV SOLN: 250.0000 mL | INTRAVENOUS | Status: DC | PRN

## 2015-03-19 MED ORDER — WARFARIN SODIUM 5 MG PO TABS: 5.0000 mg | ORAL_TABLET | ORAL | Status: DC

## 2015-03-19 MED ORDER — LATANOPROST 0.005 % OP SOLN
1.0000 [drp] | Freq: Every day | OPHTHALMIC | Status: DC
  Administered 2015-03-19 – 2015-03-22 (×4): 1 [drp] via OPHTHALMIC
  Filled 2015-03-19: qty 2.5

## 2015-03-19 MED ORDER — ALUM & MAG HYDROXIDE-SIMETH 200-200-20 MG/5ML PO SUSP: 30.0000 mL | Freq: Four times a day (QID) | ORAL | Status: DC | PRN

## 2015-03-19 MED ORDER — LORAZEPAM 2 MG/ML IJ SOLN
2.0000 mg | INTRAMUSCULAR | Status: DC | PRN
  Administered 2015-03-20: 2 mg via INTRAVENOUS
  Filled 2015-03-19: qty 1

## 2015-03-19 MED ORDER — THIAMINE HCL 100 MG/ML IJ SOLN
100.0000 mg | Freq: Every day | INTRAMUSCULAR | Status: DC
  Administered 2015-03-19: 100 mg via INTRAVENOUS
  Filled 2015-03-19: qty 1
  Filled 2015-03-19: qty 2
  Filled 2015-03-19 (×2): qty 1

## 2015-03-19 MED ORDER — SODIUM CHLORIDE 0.9 % IJ SOLN: 3.0000 mL | INTRAMUSCULAR | Status: DC | PRN

## 2015-03-19 MED ORDER — SODIUM CHLORIDE 0.9 % IJ SOLN
3.0000 mL | Freq: Two times a day (BID) | INTRAMUSCULAR | Status: DC
  Administered 2015-03-20 – 2015-03-21 (×3): 3 mL via INTRAVENOUS

## 2015-03-19 MED ORDER — ACETAMINOPHEN 325 MG PO TABS: 650.0000 mg | ORAL_TABLET | Freq: Four times a day (QID) | ORAL | Status: DC | PRN

## 2015-03-19 MED ORDER — LORAZEPAM 2 MG/ML IJ SOLN: 0.0000 mg | Freq: Two times a day (BID) | INTRAMUSCULAR | Status: DC

## 2015-03-19 MED ORDER — ATORVASTATIN CALCIUM 10 MG PO TABS
10.0000 mg | ORAL_TABLET | Freq: Every day | ORAL | Status: DC
  Filled 2015-03-19: qty 1

## 2015-03-19 MED ORDER — ONDANSETRON HCL 4 MG PO TABS: 4.0000 mg | ORAL_TABLET | Freq: Four times a day (QID) | ORAL | Status: DC | PRN

## 2015-03-19 MED ORDER — LORAZEPAM 2 MG/ML IJ SOLN
0.0000 mg | Freq: Four times a day (QID) | INTRAMUSCULAR | Status: DC
  Administered 2015-03-19: 1 mg via INTRAVENOUS
  Administered 2015-03-20: 2 mg via INTRAVENOUS
  Filled 2015-03-19 (×2): qty 1

## 2015-03-19 MED ORDER — ACETAMINOPHEN 650 MG RE SUPP: 650.0000 mg | Freq: Four times a day (QID) | RECTAL | Status: DC | PRN

## 2015-03-19 MED ORDER — SODIUM CHLORIDE 0.9 % IV BOLUS (SEPSIS)
1000.0000 mL | Freq: Once | INTRAVENOUS | Status: AC
Start: 2015-03-19 — End: 2015-03-19
  Administered 2015-03-19: 1000 mL via INTRAVENOUS

## 2015-03-19 MED ORDER — OXYCODONE HCL 5 MG PO TABS
5.0000 mg | ORAL_TABLET | ORAL | Status: DC | PRN
  Administered 2015-03-21: 5 mg via ORAL
  Filled 2015-03-19: qty 1

## 2015-03-19 MED ORDER — ADULT MULTIVITAMIN W/MINERALS CH
1.0000 | ORAL_TABLET | Freq: Every day | ORAL | Status: DC
  Administered 2015-03-20 – 2015-03-23 (×4): 1 via ORAL
  Filled 2015-03-19 (×4): qty 1

## 2015-03-19 MED ORDER — LOSARTAN POTASSIUM 50 MG PO TABS
50.0000 mg | ORAL_TABLET | Freq: Every day | ORAL | Status: DC
  Administered 2015-03-20 – 2015-03-23 (×4): 50 mg via ORAL
  Filled 2015-03-19 (×4): qty 1

## 2015-03-19 MED ORDER — POTASSIUM CHLORIDE CRYS ER 20 MEQ PO TBCR
40.0000 meq | EXTENDED_RELEASE_TABLET | Freq: Once | ORAL | Status: AC
  Administered 2015-03-19: 40 meq via ORAL
  Filled 2015-03-19: qty 2

## 2015-03-19 MED ORDER — HYDROMORPHONE HCL 1 MG/ML IJ SOLN: 0.5000 mg | INTRAMUSCULAR | Status: DC | PRN

## 2015-03-19 NOTE — Progress Notes (Signed)
ANTICOAGULATION CONSULT NOTE - Initial Consult  Pharmacy Consult:  Coumadin Indication:  History of SVC thrombosis  No Known Allergies  Patient Measurements: Height: 5\' 9"  (175.3 cm) Weight: 208 lb (94.348 kg) IBW/kg (Calculated) : 70.7  Vital Signs: Temp: 98.4 F (36.9 C) (03/30 2212) Temp Source: Oral (03/30 2212) BP: 146/92 mmHg (03/30 2212) Pulse Rate: 100 (03/30 2212)  Labs:  Recent Labs  03/19/15 1753  HGB 10.9*  HCT 33.7*  PLT 134*  LABPROT 29.6*  INR 2.78*  CREATININE 0.98  TROPONINI <0.03    Estimated Creatinine Clearance: 82.9 mL/min (by C-G formula based on Cr of 0.98).   Medical History: Past Medical History  Diagnosis Date  . Hypertension   . Hyperlipidemia   . Polio   . Hereditary and idiopathic peripheral neuropathy 09/26/2014  . Cancer     lung cancer  . Glaucoma   . Left subclavian vein thrombosis   . Ischemic colitis        Assessment: 10 YOM with history of SVC thrombus to continue on Coumadin from [facility].  Patient has already taken today dose and his INR is therapeutic.     Goal of Therapy:  INR 2-3    Plan:  - No Coumadin tonight as patient has already taken today's dose - Daily PT / INR    Ryaan Vanwagoner D. Mina Marble, PharmD, BCPS Pager:  [phone] 03/19/2015, 10:50 PM

## 2015-03-19 NOTE — ED Notes (Signed)
Attempted report.  No answer.

## 2015-03-19 NOTE — ED Notes (Signed)
Attempted report once more.  Nurse in MRI.  Will call back on her return.

## 2015-03-19 NOTE — H&P (Signed)
[facility] Admission History and Physical       [name] YTK:[id number] DOB: Feb 02, 1946 DOA: 03/19/2015  Referring physician: EDP PCP: Gennette Pac, MD  Specialists:   Chief Complaint: Nearly Passed Out  HPI: [name] is a 69 y.o. male with a history of HTN, Hyperlipidemia, SVC Thrombosis S/P stent x 2, Remote hx of Lung Ca, Alcohol Abuse and Peripheral Neuropathy who presents to the ED after he had been seen in his PCP's office for complaints of worsening edema and numbness in his BLEs.   He suffered a presyncopal event, and his blood pressure was found to be hypotensive.  EMS was called and he was brought ot the ED.   In the ED, he was found to have hypotension and tachycardia and was administered IVFs.  Interview of the patient reveals an alcohol history and he reports that he drinks 10 shot of vodka daily, and 2-4 beers.   His last drink of Vodka was the night before, and in the AM he had a beer.   His Alcohol level was less than 5.   He denies any Chest Pain or SOB  Review of Systems:  Constitutional: No Weight Loss, No Weight Gain, Night Sweats, Fevers, Chills, Dizziness, Light Headedness, Fatigue, or Generalized Weakness HEENT: No Headaches, Difficulty Swallowing,Tooth/Dental Problems,Sore Throat,  No Sneezing, Rhinitis, Ear Ache, Nasal Congestion, or Post Nasal Drip,  Cardio-vascular:  No Chest pain, Orthopnea, PND, Edema in Lower Extremities, Anasarca, Dizziness, Palpitations  Resp: No Dyspnea, No DOE, No Productive Cough, No Non-Productive Cough, No Hemoptysis, No Wheezing.    GI: No Heartburn, Indigestion, Abdominal Pain, Nausea, Vomiting, Diarrhea, Constipation, Hematemesis, Hematochezia, Melena, Change in Bowel Habits,  Loss of Appetite  GU: No Dysuria, No Change in Color of Urine, No Urgency or Urinary Frequency, No Flank pain.  Musculoskeletal: No Joint Pain or Swelling, No Decreased Range of Motion, No Back Pain.  Neurologic:  + Pre-yncope, No  Seizures, Muscle Weakness, +Paresthesias of both Feet, Vision Disturbance or Loss, No Diplopia, No Vertigo, No Difficulty Walking,  Skin: No Rash or Lesions. Psych: No Change in Mood or Affect, No Depression or Anxiety, No Memory loss, No Confusion, or Hallucinations   Past Medical History  Diagnosis Date  . Hypertension   . Hyperlipidemia   . Polio   . Hereditary and idiopathic peripheral neuropathy 09/26/2014  . Cancer     lung cancer  . Glaucoma   . Left subclavian vein thrombosis   . Ischemic colitis      Past Surgical History  Procedure Laterality Date  . Hernia repair      left ingunal  . Lung removal, partial    . Tonsillectomy        Prior to Admission medications   Medication Sig Start Date End Date Taking? Authorizing Provider  atorvastatin (LIPITOR) 10 MG tablet Take 10 mg by mouth daily. 09/23/14  Yes Historical Provider, MD  latanoprost (XALATAN) 0.005 % ophthalmic solution Place 1 drop into both eyes at bedtime.   Yes Historical Provider, MD  losartan (COZAAR) 50 MG tablet Take 50 mg by mouth daily.   Yes Historical Provider, MD  Naphazoline-Glycerin (CLEAR EYES REDNESS RELIEF OP) Apply 2 drops to eye daily.   Yes Historical Provider, MD  warfarin (COUMADIN) 5 MG tablet Take 1 tablet (5 mg total) by mouth as directed. 11/22/11  Yes Eston Esters, MD  acetaminophen-codeine 120-12 MG/5ML solution Take 2.5 mLs by mouth every 4 (four) hours as needed.  12/11/14  Historical Provider, MD     No Known Allergies  Social History:  reports that he has quit smoking. His smoking use included Cigarettes. He has a 80 pack-year smoking history. He has never used smokeless tobacco. He reports that he drinks alcohol. He reports that he does not use illicit drugs.    Family History  Problem Relation Age of Onset  . Cancer Mother     intestine  . Hyperlipidemia Mother   . Heart Problems Brother   . Heart attack Father        Physical Exam:  GEN:  Pleasant Obese  69 y.o.  Caucasian male examined and in no acute distress; cooperative with exam Filed Vitals:   03/19/15 1649 03/19/15 1746 03/19/15 1957 03/19/15 2141  BP: 158/85 140/79 133/106 145/78  Pulse: 129 118 117 104  Temp: 100.1 F (37.8 C)  99.8 F (37.7 C) 99.1 F (37.3 C)  TempSrc: Oral  Rectal Oral  Resp:   22 20  Height: 5\' 9"  (1.753 m)     Weight: 94.348 kg (208 lb)     SpO2: 95%  95% 95%   Blood pressure 145/78, pulse 104, temperature 99.1 F (37.3 C), temperature source Oral, resp. rate 20, height 5\' 9"  (1.753 m), weight 94.348 kg (208 lb), SpO2 95 %. PSYCH: He is alert and oriented x4; does not appear anxious does not appear depressed; affect is normal HEENT: Normocephalic and Atraumatic, Mucous membranes pink; PERRLA; EOM intact; Fundi:  Benign;  No scleral icterus, Nares: Patent, Oropharynx: Clear, Fair Dentition,    Neck:  FROM, No Cervical Lymphadenopathy nor Thyromegaly or Carotid Bruit; No JVD; Breasts:: Not examined CHEST WALL: No tenderness CHEST: Normal respiration, clear to auscultation bilaterally HEART: Regular rate and rhythm; no murmurs rubs or gallops BACK: No kyphosis or scoliosis; No CVA tenderness ABDOMEN: Positive Bowel Sounds, Obese, Soft Non-Tender, No Rebound or Guarding; No Masses, No Organomegaly. Rectal Exam: Not done EXTREMITIES: No Cyanosis, Clubbing, or Edema; No Ulcerations. Genitalia: not examined PULSES: 2+ and symmetric SKIN: Normal hydration no rash or ulceration CNS:  Alert and Oriented x 4, No Focal Deficits Vascular: pulses palpable throughout    Labs on Admission:  Basic Metabolic Panel:  Recent Labs Lab 03/19/15 1753  NA 138  K 3.1*  CL 97  CO2 23  GLUCOSE 125*  BUN 8  CREATININE 0.98  CALCIUM 9.7   Liver Function Tests:  Recent Labs Lab 03/19/15 1753  AST 96*  ALT 73*  ALKPHOS 161*  BILITOT 1.2  PROT 6.9  ALBUMIN 3.9   No results for input(s): LIPASE, AMYLASE in the last 168 hours. No results for input(s): AMMONIA in the  last 168 hours. CBC:  Recent Labs Lab 03/19/15 1753  WBC 7.5  NEUTROABS 5.9  HGB 10.9*  HCT 33.7*  MCV 77.6*  PLT 134*   Cardiac Enzymes:  Recent Labs Lab 03/19/15 1753  TROPONINI <0.03    BNP (last 3 results)  Recent Labs  03/19/15 1753  BNP 48.2    ProBNP (last 3 results) No results for input(s): PROBNP in the last 8760 hours.  CBG: No results for input(s): GLUCAP in the last 168 hours.  Radiological Exams on Admission: Dg Chest 2 View  03/19/2015   CLINICAL DATA:  Syncope and unsteady gait for 1 day. Ex-smoker. History of hypertension and lung cancer. Initial encounter.  EXAM: CHEST  2 VIEW  COMPARISON:  CT 12/02/2014.  Radiographs 11/13/2014.  FINDINGS: Stable postsurgical changes status post left thoracotomy and  upper lobe resection. Stable mild scarring at the left apex and left lung base. The right lung is clear. The heart size and mediastinal contours are stable. Left subclavian venous stent noted. Left-sided thoracotomy defects and a mid thoracic compression deformity appear stable. There are no acute osseous findings.  IMPRESSION: Stable postoperative chest.  No acute cardiopulmonary process.   Electronically Signed   By: Richardean Sale M.D.   On: 03/19/2015 18:43     EKG: Independently reviewed. Sinus Tachycardia rate 122   Assessment/Plan:   69 y.o. male with  Principal Problem:   1.    Alcohol withdrawal   Stepdown Unit Monitoring   CIWA Protocol with IV Ativan   IVFs      Active Problems:   2.   Near syncope- Vaso-Vagal versus Orthostatic Hypotension   Cardiac Monitoring   Check Orthostatic Vitals   IVFs      3.    Hypokalemia   Replace K+ Orally   Check Magnesium level     4.   Anemia   Send FOBT q day x 3    Monitor Hb      5.   Hereditary and idiopathic peripheral neuropathy- versus Alcohol Induced Neuropathy   Pain Control PRN   Thiamine and MVI and Folate Suppementation        6.   Hypertension   Continue Losartan Rx as  BP tolerates   Monitor BPs       7.   Hyperlipidemia   Continue Atorvastatin Rx     8.   Warfarin-induced coagulopathy   Continue Coumadin Rx   Monitor PT/INR daily     9.   DVT Prophylaxis   Therapeutic on Coumadin Rx         Code Status:     FULL CODE       Family Communication:   Wife at Bedside     Disposition Plan:    Inpatient Status        Time spent:  [facility] Pager [phone]   If [place] Please Contact the Day Rounding Team MD for [facility]  If 7PM-7AM, Please Contact Night-Floor Coverage  [email or web address] Password [email or web address] 03/19/2015, 10:23 PM     ADDENDUM:   Patient was seen and examined on 03/19/2015

## 2015-03-19 NOTE — ED Notes (Signed)
Pt here from MD office for unsteady gait and numb fingers, pt had a near syncopal episode today at the office , B/p dropped into 80's but went up , pt here with temp of 100.1 and ST at 128 , pt does state that he drinks etoh every day last drink last night

## 2015-03-19 NOTE — ED Provider Notes (Signed)
CSN: [id number]     Arrival date & time 03/19/15  1647 History   First MD Initiated Contact with Patient 03/19/15 1656     Chief Complaint  Patient presents with  . Near Syncope     (Consider location/radiation/quality/duration/timing/severity/associated sxs/prior Treatment) HPI Comments: Patient is a 69 year old male past medical history significant for hypertension, hyperlipidemia, left subclavian vein thrombosis, ischemic colitis, polio, peripheral neuropathy, history of lung cancer presenting to the emergency department from his primary care physician for evaluation of a near syncopal episode with hypotension. Patient states he went to his PCP initially for evaluation of worsening dyspnea on exertion with peripheral edema and fatigue over the last 6 months. Also wanted evaluation for worsening unsteady gait, peripheral neuropathy. He has been seen by Dr. Jannifer Franklin of neurology, attributed to alcohol use. Patient states he felt very hot became clammy and cold right for near syncopal event. Blood pressure was noted to be in the 80s, improved on ambulance ride over. Patient typically drinks 10 drinks a day as well as a 2-3 beers an evening, patient last drink 12-14 drinks last evening, admits to a beer use this morning. No history of withdrawal symptoms.   Patient is a 69 y.o. male presenting with near-syncope.  Near Syncope Associated symptoms include chills, diaphoresis and numbness (bilateral hands and feet, chronic). Pertinent negatives include no abdominal pain, chest pain, nausea or vomiting.    Past Medical History  Diagnosis Date  . Hypertension   . Hyperlipidemia   . Polio   . Hereditary and idiopathic peripheral neuropathy 09/26/2014  . Cancer     lung cancer  . Glaucoma   . Left subclavian vein thrombosis   . Ischemic colitis    Past Surgical History  Procedure Laterality Date  . Hernia repair      left ingunal  . Lung removal, partial    . Tonsillectomy     Family History   Problem Relation Age of Onset  . Cancer Mother     intestine  . Hyperlipidemia Mother   . Heart Problems Brother   . Heart attack Father    History  Substance Use Topics  . Smoking status: Former Smoker -- 2.00 packs/day for 40 years    Types: Cigarettes  . Smokeless tobacco: Never Used  . Alcohol Use: Yes     Comment: occasionally- Vodka 4 to five drinks a night    Review of Systems  Constitutional: Positive for chills and diaphoresis.  Respiratory:       Dyspnea on exertion  Cardiovascular: Positive for near-syncope. Negative for chest pain.  Gastrointestinal: Positive for diarrhea (chronic). Negative for nausea, vomiting and abdominal pain.  Neurological: Positive for tremors, light-headedness and numbness (bilateral hands and feet, chronic).  All other systems reviewed and are negative.     Allergies  Review of patient's allergies indicates no known allergies.  Home Medications   Prior to Admission medications   Medication Sig Start Date End Date Taking? Authorizing Provider  atorvastatin (LIPITOR) 10 MG tablet Take 10 mg by mouth daily. 09/23/14  Yes Historical Provider, MD  latanoprost (XALATAN) 0.005 % ophthalmic solution Place 1 drop into both eyes at bedtime.   Yes Historical Provider, MD  losartan (COZAAR) 50 MG tablet Take 50 mg by mouth daily.   Yes Historical Provider, MD  Naphazoline-Glycerin (CLEAR EYES REDNESS RELIEF OP) Apply 2 drops to eye daily.   Yes Historical Provider, MD  warfarin (COUMADIN) 5 MG tablet Take 1 tablet (5 mg total) by  mouth as directed. 11/22/11  Yes Eston Esters, MD  acetaminophen-codeine 120-12 MG/5ML solution Take 2.5 mLs by mouth every 4 (four) hours as needed.  12/11/14   Historical Provider, MD   BP 133/106 mmHg  Pulse 117  Temp(Src) 99.8 F (37.7 C) (Rectal)  Resp 22  Ht 5\' 9"  (1.753 m)  Wt 208 lb (94.348 kg)  BMI 30.70 kg/m2  SpO2 95% Physical Exam  Constitutional: He is oriented to person, place, and time. He appears  well-developed and well-nourished. No distress.  HENT:  Head: Normocephalic and atraumatic.  Right Ear: External ear normal.  Left Ear: External ear normal.  Nose: Nose normal.  Mouth/Throat: Oropharynx is clear and moist. No oropharyngeal exudate.  Eyes: Conjunctivae and EOM are normal. Pupils are equal, round, and reactive to light.  Neck: Normal range of motion. Neck supple.  Cardiovascular: Regular rhythm, normal heart sounds and intact distal pulses.  Tachycardia present.   Pulmonary/Chest: Effort normal and breath sounds normal. No respiratory distress.  Abdominal: Soft. There is no tenderness.  Musculoskeletal: He exhibits edema (1+ bilateral ankles, non-pitting).  Neurological: He is alert and oriented to person, place, and time. He has normal strength. He displays tremor (bilateral hand tremor). No cranial nerve deficit. GCS eye subscore is 4. GCS verbal subscore is 5. GCS motor subscore is 6.  Sensation grossly intact, diminished in bilateral hands and feet.  No pronator drift.  Bilateral heel-knee-shin intact.  Skin: Skin is warm and dry. He is not diaphoretic.  Nursing note and vitals reviewed.   ED Course  Procedures (including critical care time) Medications  LORazepam (ATIVAN) injection 0-4 mg (1 mg Intravenous Given 03/19/15 1755)    Followed by  LORazepam (ATIVAN) injection 0-4 mg (not administered)  thiamine (VITAMIN B-1) tablet 100 mg ( Oral See Alternative 03/19/15 1744)    Or  thiamine (B-1) injection 100 mg (100 mg Intravenous Given 03/19/15 1744)  sodium chloride 0.9 % bolus 1,000 mL (1,000 mLs Intravenous New Bag/Given 03/19/15 1754)  acetaminophen (TYLENOL) tablet 325 mg (325 mg Oral Given 03/19/15 2005)  potassium chloride SA (K-DUR,KLOR-CON) CR tablet 40 mEq (40 mEq Oral Given 03/19/15 2027)    Labs Review Labs Reviewed  CBC WITH DIFFERENTIAL/PLATELET - Abnormal; Notable for the following:    Hemoglobin 10.9 (*)    HCT 33.7 (*)    MCV 77.6 (*)    MCH 25.1  (*)    RDW 18.3 (*)    Platelets 134 (*)    Neutrophils Relative % 79 (*)    Lymphocytes Relative 11 (*)    All other components within normal limits  COMPREHENSIVE METABOLIC PANEL - Abnormal; Notable for the following:    Potassium 3.1 (*)    Glucose, Bld 125 (*)    AST 96 (*)    ALT 73 (*)    Alkaline Phosphatase 161 (*)    GFR calc non Af Amer 83 (*)    Anion gap 18 (*)    All other components within normal limits  PROTIME-INR - Abnormal; Notable for the following:    Prothrombin Time 29.6 (*)    INR 2.78 (*)    All other components within normal limits  TROPONIN I  BRAIN NATRIURETIC PEPTIDE  ETHANOL    Imaging Review Dg Chest 2 View  03/19/2015   CLINICAL DATA:  Syncope and unsteady gait for 1 day. Ex-smoker. History of hypertension and lung cancer. Initial encounter.  EXAM: CHEST  2 VIEW  COMPARISON:  CT 12/02/2014.  Radiographs  11/13/2014.  FINDINGS: Stable postsurgical changes status post left thoracotomy and upper lobe resection. Stable mild scarring at the left apex and left lung base. The right lung is clear. The heart size and mediastinal contours are stable. Left subclavian venous stent noted. Left-sided thoracotomy defects and a mid thoracic compression deformity appear stable. There are no acute osseous findings.  IMPRESSION: Stable postoperative chest.  No acute cardiopulmonary process.   Electronically Signed   By: Richardean Sale M.D.   On: 03/19/2015 18:43     EKG Interpretation   Date/Time:  Wednesday March 19 2015 17:03:08 EDT Ventricular Rate:  122 PR Interval:  169 QRS Duration: 94 QT Interval:  316 QTC Calculation: 450 R Axis:   107 Text Interpretation:  Sinus tachycardia Probable lateral infarct, old  Anteroseptal infarct, old No significant change since last tracing  Confirmed by Winfred Leeds  MD, SAM [phone]) on 03/19/2015 5:06:53 PM      On review of previous charts. Patient is chronically tachycardic between the 110s-130s dating back to 2013, even  during routine office visits.   MDM   Final diagnoses:  Near syncope  Alcohol withdrawal, uncomplicated   Filed Vitals:   03/19/15 1957  BP: 133/106  Pulse: 117  Temp: 99.8 F (37.7 C)  Resp: 22   I have reviewed nursing notes, vital signs, and all appropriate lab and imaging results for this patient.  Afebrile, NAD, non-toxic appearing, AAOx4. Patient is tachycardic with no murmur rub or gallop appreciated. Lungs are clear to auscultation. Abdomen obese soft nontender nondistended. Mild 1+ bilateral ankle edema. No neurofocal deficits on examination. Patient with bilateral hand tremulousness. AST, ALT and alkaline phosphatase elevated, likely secondary to alcohol use. No hypotension on emergency department. INR is therapeutic. EKG was sinus tachycardia. Alcohol level less then 5. Will admit patient to stepdown for near syncopal episode and alcohol withdrawal symptoms. Patient d/w with Dr. Arnoldo Morale, agrees with plan.      Baron Sane, PA-C 03/19/15 2159  Orlie Dakin, MD 03/20/15 0005

## 2015-03-19 NOTE — ED Provider Notes (Signed)
Patient with shortness of breath and dyspnea on exertion over the past several weeks and bilateral leg edema. Had near syncopal event today while at his primary care physician's office. No other associated symptoms. Patient drinks heavily, his wife states more than 5 alcoholic beverages per day. Patient is alert Glasgow Coma Score 15 mildly tremulous lungs clear auscultation heart tachycardic regular rhythm abdomen obese nontender extremities without edema  Orlie Dakin, MD 03/19/15 1954

## 2015-03-20 DIAGNOSIS — F10239 Alcohol dependence with withdrawal, unspecified: Secondary | ICD-10-CM

## 2015-03-20 DIAGNOSIS — E785 Hyperlipidemia, unspecified: Secondary | ICD-10-CM

## 2015-03-20 LAB — BASIC METABOLIC PANEL
ANION GAP: 12 (ref 5–15)
BUN: 6 mg/dL (ref 6–23)
CHLORIDE: 101 mmol/L (ref 96–112)
CO2: 25 mmol/L (ref 19–32)
Calcium: 8.7 mg/dL (ref 8.4–10.5)
Creatinine, Ser: 0.74 mg/dL (ref 0.50–1.35)
GFR calc Af Amer: >90 mL/min (ref >90)
GFR calc non Af Amer: >90 mL/min (ref >90)
Glucose, Bld: 98 mg/dL (ref 70–99)
Potassium: 3 mmol/L — ABNORMAL LOW (ref 3.5–5.1)
Sodium: 138 mmol/L (ref 135–145)

## 2015-03-20 LAB — TROPONIN I
Troponin I: <0.03 ng/mL (ref <0.031)
Troponin I: <0.03 ng/mL (ref <0.031)

## 2015-03-20 LAB — CBC
HCT: 30.6 % — ABNORMAL LOW (ref 39.0–52.0)
HEMOGLOBIN: 9.7 g/dL — AB (ref 13.0–17.0)
MCH: 25 pg — ABNORMAL LOW (ref 26.0–34.0)
MCHC: 31.7 g/dL (ref 30.0–36.0)
MCV: 78.9 fL (ref 78.0–100.0)
Platelets: 130 10*3/uL — ABNORMAL LOW (ref 150–400)
RBC: 3.88 MIL/uL — ABNORMAL LOW (ref 4.22–5.81)
RDW: 18.5 % — AB (ref 11.5–15.5)
WBC: 5.7 10*3/uL (ref 4.0–10.5)

## 2015-03-20 LAB — TSH: TSH: 3.378 u[IU]/mL (ref 0.350–4.500)

## 2015-03-20 LAB — PROTIME-INR
INR: 2.44 — ABNORMAL HIGH (ref 0.00–1.49)
Prothrombin Time: 26.7 seconds — ABNORMAL HIGH (ref 11.6–15.2)

## 2015-03-20 LAB — HEPATIC FUNCTION PANEL
ALT: 55 U/L — AB (ref 0–53)
AST: 65 U/L — ABNORMAL HIGH (ref 0–37)
Albumin: 3.3 g/dL — ABNORMAL LOW (ref 3.5–5.2)
Alkaline Phosphatase: 136 U/L — ABNORMAL HIGH (ref 39–117)
BILIRUBIN TOTAL: 0.9 mg/dL (ref 0.3–1.2)
Bilirubin, Direct: 0.2 mg/dL (ref 0.0–0.5)
Indirect Bilirubin: 0.7 mg/dL (ref 0.3–0.9)
Total Protein: 6.4 g/dL (ref 6.0–8.3)

## 2015-03-20 LAB — OCCULT BLOOD X 1 CARD TO LAB, STOOL: Fecal Occult Bld: NEGATIVE

## 2015-03-20 LAB — MRSA PCR SCREENING: MRSA BY PCR: NEGATIVE

## 2015-03-20 MED ORDER — CHLORDIAZEPOXIDE HCL 25 MG PO CAPS
25.0000 mg | ORAL_CAPSULE | Freq: Four times a day (QID) | ORAL | Status: AC | PRN
  Administered 2015-03-20: 25 mg via ORAL
  Filled 2015-03-20 (×3): qty 5

## 2015-03-20 MED ORDER — CHLORDIAZEPOXIDE HCL 5 MG PO CAPS
50.0000 mg | ORAL_CAPSULE | Freq: Once | ORAL | Status: AC
  Administered 2015-03-20: 50 mg via ORAL

## 2015-03-20 MED ORDER — THIAMINE HCL 100 MG/ML IJ SOLN: 100.0000 mg | Freq: Once | INTRAMUSCULAR | Status: DC

## 2015-03-20 MED ORDER — HYDROXYZINE HCL 25 MG PO TABS
25.0000 mg | ORAL_TABLET | Freq: Four times a day (QID) | ORAL | Status: AC | PRN
  Administered 2015-03-20 – 2015-03-21 (×3): 25 mg via ORAL
  Filled 2015-03-20 (×5): qty 1

## 2015-03-20 MED ORDER — ONDANSETRON 4 MG PO TBDP
4.0000 mg | ORAL_TABLET | Freq: Four times a day (QID) | ORAL | Status: AC | PRN
Start: 2015-03-20 — End: 2015-03-23
  Filled 2015-03-20: qty 1

## 2015-03-20 MED ORDER — CHLORDIAZEPOXIDE HCL 5 MG PO CAPS
25.0000 mg | ORAL_CAPSULE | Freq: Once | ORAL | Status: AC
  Administered 2015-03-20: 25 mg via ORAL
  Filled 2015-03-20: qty 5

## 2015-03-20 MED ORDER — WARFARIN SODIUM 5 MG PO TABS
5.0000 mg | ORAL_TABLET | Freq: Once | ORAL | Status: AC
  Administered 2015-03-20: 5 mg via ORAL
  Filled 2015-03-20: qty 1

## 2015-03-20 MED ORDER — LOPERAMIDE HCL 2 MG PO CAPS
2.0000 mg | ORAL_CAPSULE | ORAL | Status: AC | PRN
  Administered 2015-03-23: 4 mg via ORAL
  Filled 2015-03-20: qty 2

## 2015-03-20 MED ORDER — WARFARIN - PHARMACIST DOSING INPATIENT
Freq: Every day | Status: DC
  Administered 2015-03-20: 18:00:00

## 2015-03-20 MED ORDER — VITAMIN B-1 100 MG PO TABS: 100.0000 mg | ORAL_TABLET | Freq: Every day | ORAL | Status: DC

## 2015-03-20 MED ORDER — ADULT MULTIVITAMIN W/MINERALS CH: 1.0000 | ORAL_TABLET | Freq: Every day | ORAL | Status: DC

## 2015-03-20 NOTE — Progress Notes (Signed)
[name]:[id number] DOB: 08/11/1946 DOA: 03/19/2015 PCP: Gennette Pac, MD  Brief narrative: 68 ? h/o Lung CA T3/N0 NSCLC s/p L Upper lobectomy 03/2004 [[email or web address]], previously followed Dr. Truddie Coco [Rx Carboplatin/Taxo-completed all therapy inc XRT 10/2004], prior Lft UE DVT on Coumadin,Chronic ETOH use with ~8-10 ETOH drinks [Vodka]/ day, Hld, Platinum premature CAD [brother in 40's] started to have episodic ? bilateral LE edema over the past 3-4 mo.  His wife of 10 yrs noticed that on vacation recently patient had increasing dyspnea on exertion with the difficulty walking flat more than a couple of blocks. Patient is baseline sedentary at home and lays around the house but previously used to be more active if She also states that the swelling in his lower extremities worsens at the end of the day and he patient keeps his legs propped up. Patient had a virtual colonoscopy about one week ago-he seemed to be very shaky after this probably per wife's report because of lack of alcohol Because of increasing swelling in the lower extremities as well as worsening shortness of breath patient went to see primary care physician 3/30 p.m. He usually has at least a beer in the morning and does start drinking early in the day-he was feeling a little jittery at the primary care physician office and started to feel uncomfortable as well as started to feel jittery and noted that he was diaphoretic. Blood pressure per report was 80/60 in the primary care office Patient presented to the ED and was told by primary care physician that he may need a stress test.  Point-of-care troponin 0.03, proBNP 46  Hemoglobin 10.9, platelet 134, MCV 77 INR 2.7 LFTs alkaline phosphatase 161, AST/ALT 96/73 Potassium 3.1 Magnesium not checked  2 VW CXR = stable postoperative chest EKG = sinus tachycardia PR interval 0.12, QRS axis LAFB, probable rate related changes across precordium in lead 3 and 4 because of sinus  tachycardia, meets Estes criteria for LVH   Past medical history-As per Problem list Chart reviewed as below- Reviewed  Consultants:  None  Procedures:  None  Antibiotics:  None   Subjective   Feels fine, no chest pain no nausea no vomiting no shortness of breath No blurred vision no double vision No cough no cold no fever no chills No diarrhea, no dysuria, no hematemesis Lower extruded swelling is resolved   Objective    Interim History:   Telemetry: Sinus rhythm/sinus tach   Objective: Filed Vitals:   03/19/15 2212 03/19/15 2334 03/20/15 0340 03/20/15 0342  BP: 146/92 168/78  141/78  Pulse: 100 96  85  Temp: 98.4 F (36.9 C) 98.6 F (37 C) 99 F (37.2 C)   TempSrc: Oral Oral Oral   Resp: 18 19  18   Height:      Weight:      SpO2: 96% 95%  96%    Intake/Output Summary (Last 24 hours) at 03/20/15 0801 Last data filed at 03/20/15 0600  Gross per 24 hour  Intake    720 ml  Output    800 ml  Net    -80 ml    Exam:  General: EOMI NCAT, Mallampati 2, lan, rhinophyma, no scleral icterus mild pallor Cardiovascular: S1-S2 slightly tachycardic PMI displaced laterally outside of the left fifth intercostal space, cannot appreciate JVD, no bruit Respiratory: Clinically clear no added sound Abdomen: Soft nontender nondistended? Hepatomegaly, no splenomegaly, CVA angles are nontender Skin stasis dermatitis however no swelling, dorsalis pedis within normal limits Neuro grossly  normal, moving all 4 limbs equally, oriented X3  Data Reviewed: Basic Metabolic Panel:  Recent Labs Lab 03/19/15 1753 03/20/15 0331  NA 138 138  K 3.1* 3.0*  CL 97 101  CO2 23 25  GLUCOSE 125* 98  BUN 8 6  CREATININE 0.98 0.74  CALCIUM 9.7 8.7   Liver Function Tests:  Recent Labs Lab 03/19/15 1753  AST 96*  ALT 73*  ALKPHOS 161*  BILITOT 1.2  PROT 6.9  ALBUMIN 3.9   No results for input(s): LIPASE, AMYLASE in the last 168 hours. No results for input(s): AMMONIA  in the last 168 hours. CBC:  Recent Labs Lab 03/19/15 1753 03/20/15 0331  WBC 7.5 5.7  NEUTROABS 5.9  --   HGB 10.9* 9.7*  HCT 33.7* 30.6*  MCV 77.6* 78.9  PLT 134* 130*   Cardiac Enzymes:  Recent Labs Lab 03/19/15 1753  TROPONINI <0.03   BNP: Invalid input(s): POCBNP CBG: No results for input(s): GLUCAP in the last 168 hours.  Recent Results (from the past 240 hour(s))  MRSA PCR Screening     Status: None   Collection Time: 03/19/15 10:51 PM  Result Value Ref Range Status   MRSA by PCR NEGATIVE NEGATIVE Final    Comment:        The GeneXpert MRSA Assay (FDA approved for NASAL specimens only), is one component of a comprehensive MRSA colonization surveillance program. It is not intended to diagnose MRSA infection nor to guide or monitor treatment for MRSA infections.      Studies:              All Imaging reviewed and is as per above notation   Scheduled Meds: . atorvastatin  10 mg Oral Daily  . enoxaparin (LOVENOX) injection  40 mg Subcutaneous Q24H  . folic acid  1 mg Oral Daily  . latanoprost  1 drop Both Eyes QHS  . LORazepam  0-4 mg Intravenous 4 times per day   Followed by  . [START ON 03/21/2015] LORazepam  0-4 mg Intravenous Q12H  . losartan  50 mg Oral Daily  . multivitamin with minerals  1 tablet Oral Daily  . sodium chloride  3 mL Intravenous Q12H  . thiamine  100 mg Oral Daily   Or  . thiamine  100 mg Intravenous Daily   Continuous Infusions: . sodium chloride 75 mL/hr at 03/20/15 0208     Assessment/Plan: 1. Probable vasovagal event leading to hypotension-patient might have been actively withdrawing yesterday. Keep on EtOH withdrawal protocol. Monitor on step down today. Repeat EKG this morning, cycle troponins. Pro BNP pointing against acute heart failure given this is negative [46] however rate related changes on EKG cannot be ignored and he may have some mild LVH. We will have discussion later on during the admission whether we should  have an echocardiogram performed as he is a heavy drinker 2. 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 Patient drinks relatively heavily. He is also on Coumadin. His platelets reflect toxic effects of ethanol. This will need to be carefully discussed with him by his primary care physician moving forward.  We will repeat an INR in the morning. Repeat CBC plus differential as well as LFTs. 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 We will ask social worker to weigh in and help with coping strategies/resources 3. Bilateral lower extremity swelling-? Hepatic versus cardiac-consider abdominal ultrasound. Patient has no pain however at present time so this is not emergent.  4. Microcytic anemia-probably secondary to iron deficiency/toxic effect EtOH as above-guaiac stool. Transfusion threshold in this patient less than 7 5. 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 Check magnesium this morning. 6. Uncontrolled hypertension-patient on losartan 50 mg monotherapy. We will add low-dose amlodipine/beta blocker if needed moving forward 7. History stage T3 N0 non-small cell lung cancer status post carboplatin/Taxol + chemotherapy radiation 2005-patient will need further follow-up as an outpatient with primary physician 8. History left upper extremity DVT-Coumadin per pharmacy. Recheck INR in a.m.. 9. History hyperlipidemia-monitor LFTs carefully-usually ALT greater than 3 times normal limits is a relative contraindication. I will hold his atorvastatin for now pending result.  Code Status: Full  Family Communication: Long discussion with wife at bedside  Disposition Plan: Keep on step down today see above discussion Expect 1-2 days at least hospitalization  Verneita Griffes, MD  [facility] Pager [phone] 03/20/2015, 8:01 AM    LOS: 1 day

## 2015-03-20 NOTE — Progress Notes (Signed)
UR COMPLETED  

## 2015-03-20 NOTE — Care Management Note (Signed)
    Page 1 of 2   03/23/2015     1:13:23 PM CARE MANAGEMENT NOTE 03/23/2015  Patient:  VICTORIO, CREEDEN   Account Number:  [id number]  Date Initiated:  03/20/2015  Documentation initiated by:  Whitman Hero  Subjective/Objective Assessment:   PTA from home with wife, admitted with alcohol withdrawal.     Action/Plan:   Return to home when medically stable. CM to f/u with discharge needs.   Anticipated DC Date:  03/23/2015   Anticipated DC Plan:  [place]  CM consult      [facility] Choice  HOME HEALTH   Choice offered to / List presented to:  C-3 Spouse   DME arranged  OTHER - SEE COMMENT      DME agency  [place] arranged  [place].   Status of service:  Completed, signed off Medicare Important Message given?  NA - LOS <3 / Initial given by admissions (If response is "NO", the following Medicare IM given date fields will be blank) Date Medicare IM given:   Medicare IM given by:   Date Additional Medicare IM given:   Additional Medicare IM given by:    Discharge Disposition:  [place]  Per UR Regulation:  Reviewed for med. necessity/level of care/duration of stay  If discussed at [place] of Stay Meetings, dates discussed:    Comments:  03/23/15 13:10 CM spoke with pt's spouse, Pamala Hurry [phone] to offer choice of home health agency.  Pt's family have used [facility]  (in [place]) in the past and wish to have her again.  [place] brought to room by DME rep, Jeneen Rinks. Referral called to [facility] rep, Colletta [place] with request for [facility].  No other CM needs were communicated. Mariane Masters, BSN, CM [phone].

## 2015-03-20 NOTE — Progress Notes (Signed)
ANTICOAGULATION CONSULT NOTE - Follow Up Consult  Pharmacy Consult for Coumadin Indication: History of SVC thrombosis  No Known Allergies  Patient Measurements: Height: 5\' 9"  (175.3 cm) Weight: 208 lb (94.348 kg) IBW/kg (Calculated) : 70.7  Vital Signs: Temp: 98.5 F (36.9 C) (03/31 1205) Temp Source: Oral (03/31 1205) BP: 141/78 mmHg (03/31 0342) Pulse Rate: 85 (03/31 0342)  Labs:  Recent Labs  03/19/15 1753 03/20/15 0331 03/20/15 1009 03/20/15 1118  HGB 10.9* 9.7*  --   --   HCT 33.7* 30.6*  --   --   PLT 134* 130*  --   --   LABPROT 29.6*  --   --  26.7*  INR 2.78*  --   --  2.44*  CREATININE 0.98 0.74  --   --   TROPONINI <0.03  --  <0.03  --     Estimated Creatinine Clearance: 101.5 mL/min (by C-G formula based on Cr of 0.74).  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 His INR remains therapeutic on his home dose. CBC is stable. No bleeding reported.  Home dose: 7.5mg  daily except 5mg  Tues/Thurs  Goal of Therapy:  INR 2-3 Monitor platelets by anticoagulation protocol: Yes   Plan:  1) Coumadin 5mg  x 1 per home regimen 2) INR in AM  Deboraha Sprang 03/20/2015,12:50 PM

## 2015-03-20 NOTE — Progress Notes (Signed)
Patient having visual hallucinations, seeing "cats and someone with a shotgun" in the room. Very anxious and trying to get up out of bed despite wife and RN's redirection. Patient pulled out IV. CIWA 21 at the beginning of shift, patient given PRN dose of 25mg  PO Librium, upon reassessment CIWA of 23. Patient then given 25mg  of PO atarax. Follow up CIWA of 22. Kathline Magic notified. Order for one time dose of 25mg  PO Librium received. Will continue to monitor.   Lum Babe, RN

## 2015-03-21 DIAGNOSIS — I509 Heart failure, unspecified: Secondary | ICD-10-CM

## 2015-03-21 LAB — COMPREHENSIVE METABOLIC PANEL
ALBUMIN: 3.3 g/dL — AB (ref 3.5–5.2)
ALT: 48 U/L (ref 0–53)
AST: 60 U/L — AB (ref 0–37)
Alkaline Phosphatase: 130 U/L — ABNORMAL HIGH (ref 39–117)
Anion gap: 7 (ref 5–15)
BILIRUBIN TOTAL: 1 mg/dL (ref 0.3–1.2)
BUN: <5 mg/dL — ABNORMAL LOW (ref 6–23)
CHLORIDE: 103 mmol/L (ref 96–112)
CO2: 29 mmol/L (ref 19–32)
CREATININE: 0.7 mg/dL (ref 0.50–1.35)
Calcium: 8.9 mg/dL (ref 8.4–10.5)
GFR calc Af Amer: >90 mL/min (ref >90)
GFR calc non Af Amer: >90 mL/min (ref >90)
Glucose, Bld: 94 mg/dL (ref 70–99)
Potassium: 2.9 mmol/L — ABNORMAL LOW (ref 3.5–5.1)
Sodium: 139 mmol/L (ref 135–145)
Total Protein: 6.3 g/dL (ref 6.0–8.3)

## 2015-03-21 LAB — PROTIME-INR
INR: 1.78 — ABNORMAL HIGH (ref 0.00–1.49)
Prothrombin Time: 20.8 seconds — ABNORMAL HIGH (ref 11.6–15.2)

## 2015-03-21 LAB — MAGNESIUM: MAGNESIUM: 1.2 mg/dL — AB (ref 1.5–2.5)

## 2015-03-21 MED ORDER — MAGNESIUM OXIDE 400 (241.3 MG) MG PO TABS
400.0000 mg | ORAL_TABLET | Freq: Every day | ORAL | Status: DC
  Administered 2015-03-21 – 2015-03-23 (×3): 400 mg via ORAL
  Filled 2015-03-21 (×4): qty 1

## 2015-03-21 MED ORDER — POTASSIUM CHLORIDE CRYS ER 20 MEQ PO TBCR
40.0000 meq | EXTENDED_RELEASE_TABLET | Freq: Two times a day (BID) | ORAL | Status: DC
  Administered 2015-03-21: 40 meq via ORAL
  Filled 2015-03-21 (×2): qty 2

## 2015-03-21 MED ORDER — CARVEDILOL 3.125 MG PO TABS
3.1250 mg | ORAL_TABLET | Freq: Two times a day (BID) | ORAL | Status: DC
  Administered 2015-03-22 – 2015-03-23 (×3): 3.125 mg via ORAL
  Filled 2015-03-21 (×6): qty 1

## 2015-03-21 MED ORDER — WARFARIN SODIUM 7.5 MG PO TABS
7.5000 mg | ORAL_TABLET | Freq: Once | ORAL | Status: AC
  Administered 2015-03-21: 7.5 mg via ORAL
  Filled 2015-03-21 (×2): qty 1

## 2015-03-21 MED ORDER — POTASSIUM CHLORIDE 20 MEQ/15ML (10%) PO SOLN
40.0000 meq | Freq: Two times a day (BID) | ORAL | Status: DC
  Administered 2015-03-21: 40 meq via ORAL
  Filled 2015-03-21 (×4): qty 30

## 2015-03-21 NOTE — Progress Notes (Signed)
[name]:[id number] DOB: 07/03/1946 DOA: 03/19/2015 PCP: Gennette Pac, MD  Brief narrative: 44 ? h/o Lung CA T3/N0 NSCLC s/p L Upper lobectomy 03/2004 [[facility]], previously followed Dr. Truddie Coco [Rx Carboplatin/Taxo-completed all therapy inc XRT 10/2004], prior Lft UE DVT on Coumadin,Chronic ETOH use with ~8-10 ETOH drinks [Vodka]/ day, Hld, [place] premature CAD [brother in 40's] started to have episodic ? bilateral LE edema over the past 3-4 mo.  His wife of 10 yrs noticed that on vacation recently patient had increasing dyspnea on exertion with the difficulty walking flat more than a couple of blocks. Patient is baseline sedentary at home and lays around the house but previously used to be more active if She also states that the swelling in his lower extremities worsens at the end of the day and he patient keeps his legs propped up. Patient had a virtual colonoscopy about one week ago-he seemed to be very shaky after this probably per wife's report because of lack of alcohol Because of increasing swelling in the lower extremities as well as worsening shortness of breath patient went to see primary care physician 3/30 p.m. He usually has at least a beer in the morning and does start drinking early in the day-he was feeling a little jittery at the primary care physician office and started to feel uncomfortable as well as started to feel jittery and noted that he was diaphoretic. Blood pressure per report was 80/60 in the primary care office Patient presented to the ED and was told by primary care physician that he may need a stress test.  Point-of-care troponin 0.03, proBNP 46  Hemoglobin 10.9, platelet 134, MCV 77 INR 2.7 LFTs alkaline phosphatase 161, AST/ALT 96/73 Potassium 3.1 Magnesium not checked  2 VW CXR = stable postoperative chest EKG = sinus tachycardia PR interval 0.12, QRS axis LAFB, probable rate related changes across precordium in lead 3 and 4 because of sinus  tachycardia, meets Estes criteria for LVH   Past medical history-As per Problem list Chart reviewed as below- Reviewed  Consultants:  None  Procedures:  None  Antibiotics:  None   Subjective   Feels fine, no chest pain no nausea no vomiting no shortness of breath No blurred vision no double vision No cough no cold no fever no chills No diarrhea, no dysuria, no hematemesis Lower extruded swelling is resolved   Objective    Interim History:   Telemetry: Sinus rhythm/sinus tach   Objective: Filed Vitals:   03/21/15 1203 03/21/15 1204 03/21/15 1601 03/21/15 1602  BP: 153/87  156/85   Pulse: 82  91   Temp:  98 F (36.7 C)  99.3 F (37.4 C)  TempSrc:  Oral  Oral  Resp: 28  30   Height:      Weight:      SpO2: 99%  93%     Intake/Output Summary (Last 24 hours) at 03/21/15 1840 Last data filed at 03/21/15 1822  Gross per 24 hour  Intake   3420 ml  Output   2450 ml  Net    970 ml    Exam:  General: EOMI NCAT, Mallampati 2, lan, rhinophyma, no scleral icterus mild pallor Cardiovascular: S1-S2 slightly tachycardic PMI displaced laterally outside of the left fifth intercostal space, cannot appreciate JVD, no bruit Respiratory: Clinically clear no added sound Abdomen: Soft nontender nondistended? Hepatomegaly, no splenomegaly, CVA angles are nontender Skin stasis dermatitis however no swelling, dorsalis pedis within normal limits Neuro grossly normal, moving all 4 limbs  equally, oriented X3  Data Reviewed: Basic Metabolic Panel:  Recent Labs Lab 03/19/15 1753 03/20/15 0331 03/21/15 0338 03/21/15 0411  NA 138 138 139  --   K 3.1* 3.0* 2.9*  --   CL 97 101 103  --   CO2 23 25 29   --   GLUCOSE 125* 98 94  --   BUN 8 6 <5*  --   CREATININE 0.98 0.74 0.70  --   CALCIUM 9.7 8.7 8.9  --   MG  --   --   --  1.2*   Liver Function Tests:  Recent Labs Lab 03/19/15 1753 03/20/15 1009 03/21/15 0338  AST 96* 65* 60*  ALT 73* 55* 48  ALKPHOS 161*  136* 130*  BILITOT 1.2 0.9 1.0  PROT 6.9 6.4 6.3  ALBUMIN 3.9 3.3* 3.3*   No results for input(s): LIPASE, AMYLASE in the last 168 hours. No results for input(s): AMMONIA in the last 168 hours. CBC:  Recent Labs Lab 03/19/15 1753 03/20/15 0331  WBC 7.5 5.7  NEUTROABS 5.9  --   HGB 10.9* 9.7*  HCT 33.7* 30.6*  MCV 77.6* 78.9  PLT 134* 130*   Cardiac Enzymes:  Recent Labs Lab 03/19/15 1753 03/20/15 1009 03/20/15 1409 03/20/15 1952  TROPONINI <0.03 <0.03 <0.03 <0.03   BNP: Invalid input(s): POCBNP CBG: No results for input(s): GLUCAP in the last 168 hours.  Recent Results (from the past 240 hour(s))  MRSA PCR Screening     Status: None   Collection Time: 03/19/15 10:51 PM  Result Value Ref Range Status   MRSA by PCR NEGATIVE NEGATIVE Final    Comment:        The GeneXpert MRSA Assay (FDA approved for NASAL specimens only), is one component of a comprehensive MRSA colonization surveillance program. It is not intended to diagnose MRSA infection nor to guide or monitor treatment for MRSA infections.      Studies:              All Imaging reviewed and is as per above notation   Scheduled Meds: . folic acid  1 mg Oral Daily  . latanoprost  1 drop Both Eyes QHS  . losartan  50 mg Oral Daily  . magnesium oxide  400 mg Oral Daily  . multivitamin with minerals  1 tablet Oral Daily  . potassium chloride  40 mEq Oral BID  . sodium chloride  3 mL Intravenous Q12H  . thiamine  100 mg Oral Daily   Or  . thiamine  100 mg Intravenous Daily  . Warfarin - Pharmacist Dosing Inpatient   Does not apply q1800   Continuous Infusions: . sodium chloride 75 mL/hr at 03/21/15 0449     Assessment/Plan: 1. Probable vasovagal event leading to hypotension-patient might have been actively withdrawing yesterday. Keep on EtOH withdrawal protocol. Monitor on step down today. Repeat EKG this morning, cycle troponins. Pro BNP pointing against acute heart failure given this is  negative [46] however rate related changes on EKG cannot be ignored and he may have some mild LVH. ULAG--04/22/63 grade 1 diastolic dysfunction. I do not think he needs a Myoview stress test as an inpatient. 2. 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 Patient drinks relatively heavily. He is also on Coumadin. His platelets reflect toxic effects of ethanol. This will need to be carefully discussed with him by his primary care physician moving forward.  --Cage questionnaire reveals 3/4 response is significant for ethanolism. We will ask social worker to  weigh in and help with coping strategies/resources--he has moderate to severe delirium tremens and will need to be monitored for another 24 hours on step down unit 3. Bilateral lower extremity swelling-? Hepatic versus cardiac-consider abdominal ultrasound. Patient has no pain however at present time so this is not emergent. 4. Microcytic anemia-probably secondary to iron deficiency/toxic effect EtOH as above-guaiac stool. Transfusion threshold in this patient less than 7 5. 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  magnesium this morning 1.2,--replacing with potassium 40 twice a day, Mag-Ox 400 daily  6. Uncontrolled hypertension-patient on losartan 50 mg monotherapy. Added Coreg 3. 125 twice a day 03/21/15 7. History stage T3 N0 non-small cell lung cancer status post carboplatin/Taxol + chemotherapy radiation 2005-patient will need further follow-up as an outpatient with primary physician 8. History left upper extremity DVT-Coumadin per pharmacy. INR 1.78  9. History hyperlipidemia-monitor LFTs carefully-usually ALT greater than 3 times normal limits is a relative contraindication. I will hold his atorvastatin for now pending result.  Code Status: Full  Family Communication: Long discussion with wife at bedside  Disposition Plan: Keep on step down today see above discussion   Verneita Griffes, MD  [facility] Pager  [phone] 03/21/2015, 6:40 PM    LOS: 2 days

## 2015-03-21 NOTE — Progress Notes (Signed)
ANTICOAGULATION CONSULT NOTE - Follow Up Consult  Pharmacy Consult for Coumadin Indication: hx of SVC thrombosis  No Known Allergies  Patient Measurements: Height: 5\' 9"  (175.3 cm) Weight: 208 lb (94.348 kg) IBW/kg (Calculated) : 70.7  Vital Signs: Temp: 98 F (36.7 C) (04/01 1204) Temp Source: Oral (04/01 1204) BP: 153/87 mmHg (04/01 1203) Pulse Rate: 82 (04/01 1203)  Labs:  Recent Labs  03/19/15 1753 03/20/15 0331 03/20/15 1009 03/20/15 1118 03/20/15 1409 03/20/15 1952 03/21/15 0338  HGB 10.9* 9.7*  --   --   --   --   --   HCT 33.7* 30.6*  --   --   --   --   --   PLT 134* 130*  --   --   --   --   --   LABPROT 29.6*  --   --  26.7*  --   --  20.8*  INR 2.78*  --   --  2.44*  --   --  1.78*  CREATININE 0.98 0.74  --   --   --   --  0.70  TROPONINI <0.03  --  <0.03  --  <0.03 <0.03  --     Estimated Creatinine Clearance: 101.5 mL/min (by C-G formula based on Cr of 0.7).  Assessment:   INR has fallen subtherapeutic (2.78->2.44->1.78), despite continuation of home Coumadin regimen.  Reports not missing and Coumadin doses prior to admission.   Home regimen: Coumadin 7.5 mg daily except 5 mg on Tuesdays and Thursdays.  Goal of Therapy:  INR 2-3 Monitor platelets by anticoagulation protocol: Yes   Plan:   Coumadin 7.5 mg today, usual Friday dose.  Daily PT/INR.  CBC in am.  Arty Baumgartner, [place] Pager: [phone] 03/21/2015,3:22 PM

## 2015-03-21 NOTE — Progress Notes (Signed)
  Echocardiogram 2D Echocardiogram has been performed.  [name] 03/21/2015, 4:38 PM

## 2015-03-21 NOTE — Clinical Social Work Note (Signed)
CSW Consult Acknowledged:   CSW received a consult for substance abuse. CSW cannot completed SBIRT due to pt's current orientation level. CSW will continue to follow the pt in order to complete the assessment.       [place], MSW, [place]

## 2015-03-22 LAB — CBC
HCT: 30.4 % — ABNORMAL LOW (ref 39.0–52.0)
Hemoglobin: 9.6 g/dL — ABNORMAL LOW (ref 13.0–17.0)
MCH: 24.9 pg — AB (ref 26.0–34.0)
MCHC: 31.6 g/dL (ref 30.0–36.0)
MCV: 79 fL (ref 78.0–100.0)
PLATELETS: 146 10*3/uL — AB (ref 150–400)
RBC: 3.85 MIL/uL — ABNORMAL LOW (ref 4.22–5.81)
RDW: 18.9 % — ABNORMAL HIGH (ref 11.5–15.5)
WBC: 6.3 10*3/uL (ref 4.0–10.5)

## 2015-03-22 LAB — COMPREHENSIVE METABOLIC PANEL
ALBUMIN: 3 g/dL — AB (ref 3.5–5.2)
ALT: 40 U/L (ref 0–53)
ANION GAP: 3 — AB (ref 5–15)
AST: 47 U/L — ABNORMAL HIGH (ref 0–37)
Alkaline Phosphatase: 123 U/L — ABNORMAL HIGH (ref 39–117)
BUN: 6 mg/dL (ref 6–23)
CALCIUM: 8.3 mg/dL — AB (ref 8.4–10.5)
CHLORIDE: 105 mmol/L (ref 96–112)
CO2: 30 mmol/L (ref 19–32)
Creatinine, Ser: 0.68 mg/dL (ref 0.50–1.35)
GFR calc non Af Amer: >90 mL/min (ref >90)
Glucose, Bld: 100 mg/dL — ABNORMAL HIGH (ref 70–99)
Potassium: 2.9 mmol/L — ABNORMAL LOW (ref 3.5–5.1)
Sodium: 138 mmol/L (ref 135–145)
TOTAL PROTEIN: 6 g/dL (ref 6.0–8.3)
Total Bilirubin: 0.7 mg/dL (ref 0.3–1.2)

## 2015-03-22 LAB — PROTIME-INR
INR: 1.36 (ref 0.00–1.49)
Prothrombin Time: 17 seconds — ABNORMAL HIGH (ref 11.6–15.2)

## 2015-03-22 LAB — MAGNESIUM: Magnesium: 1.2 mg/dL — ABNORMAL LOW (ref 1.5–2.5)

## 2015-03-22 MED ORDER — POTASSIUM CHLORIDE 20 MEQ/15ML (10%) PO SOLN
40.0000 meq | Freq: Three times a day (TID) | ORAL | Status: DC
  Administered 2015-03-22 – 2015-03-23 (×4): 40 meq via ORAL
  Filled 2015-03-22 (×6): qty 30

## 2015-03-22 MED ORDER — WARFARIN SODIUM 10 MG PO TABS
10.0000 mg | ORAL_TABLET | Freq: Once | ORAL | Status: AC
  Administered 2015-03-22: 10 mg via ORAL
  Filled 2015-03-22 (×2): qty 1

## 2015-03-22 MED ORDER — ENOXAPARIN SODIUM 40 MG/0.4ML ~~LOC~~ SOLN
40.0000 mg | SUBCUTANEOUS | Status: DC
  Administered 2015-03-22: 40 mg via SUBCUTANEOUS
  Filled 2015-03-22 (×3): qty 0.4

## 2015-03-22 NOTE — Progress Notes (Signed)
ANTICOAGULATION CONSULT NOTE - Follow Up Consult  Pharmacy Consult for Coumadin Indication: Hx of LUE DVT  No Known Allergies  Patient Measurements: Height: 5\' 9"  (175.3 cm) Weight: 208 lb (94.348 kg) IBW/kg (Calculated) : 70.7  Vital Signs: Temp: 98.7 F (37.1 C) (04/02 0712) Temp Source: Oral (04/02 0712) BP: 149/87 mmHg (04/02 0712) Pulse Rate: 89 (04/02 0712)  Labs:  Recent Labs  03/19/15 1753 03/20/15 0331 03/20/15 1009 03/20/15 1118 03/20/15 1409 03/20/15 1952 03/21/15 0338 03/22/15 0253  HGB 10.9* 9.7*  --   --   --   --   --  9.6*  HCT 33.7* 30.6*  --   --   --   --   --  30.4*  PLT 134* 130*  --   --   --   --   --  146*  LABPROT 29.6*  --   --  26.7*  --   --  20.8* 17.0*  INR 2.78*  --   --  2.44*  --   --  1.78* 1.36  CREATININE 0.98 0.74  --   --   --   --  0.70 0.68  TROPONINI <0.03  --  <0.03  --  <0.03 <0.03  --   --     Estimated Creatinine Clearance: 101.5 mL/min (by C-G formula based on Cr of 0.68).  Assessment: INR continues to trend down 2.78>>2.44>>1.78>>1.36, despite continuing patient on home regimen. Spoke with Dr. Verlon Au, okay to start lovenox 40 mg SQ daily until INR is therapeutic for DVT prophylaxis. Hgb remains stable at 9.6, platelets stable at 146.  Reports not missing any Coumadin doses prior to admission.  Unclear why patient's INR continues to drop drastically - could be due to improving liver function, lack of alcohol (ETOH effects on INR are variable), or patient's diet. Patient also started on Multivitamin that has Vitamin K - may be having an effect on INR.  Home regimen: Coumadin 7.5 mg daily except 5 mg on Tuesdays and Thursdays.  Goal of Therapy:  INR 2-3 Monitor platelets by anticoagulation protocol: Yes   Plan:  - Give coumadin 10 mg today as INR continues decrease - Start Lovenox 40 mg SQ Daily for DVT prophylaxis while INR is subtherapeutic - Consider discontinuing MVI as it has Vitamin K and may be effecting INR.  Could change to prosight. - daily PT/INR  Theron Arista, PharmD Clinical Pharmacist - Resident Pager: [phone] 4/2/20168:33 AM

## 2015-03-22 NOTE — Progress Notes (Signed)
[name]:[id number] DOB: 11-10-1946 DOA: 03/19/2015 PCP: Gennette Pac, MD  Brief narrative: 67 ? h/o Lung CA T3/N0 NSCLC s/p L Upper lobectomy 03/2004 [[facility]on], previously followed Dr. Truddie Coco [Rx Carboplatin/Taxo-completed all therapy inc XRT 10/2004], prior Lft UE DVT on Coumadin,Chronic ETOH use with ~8-10 ETOH drinks [Vodka]/ day, Hld, [place] premature CAD [brother in 40's] started to have episodic ? bilateral LE edema over the past 3-4 mo.  His wife of 10 yrs noticed that on vacation recently patient had increasing dyspnea on exertion with the difficulty walking flat more than a couple of blocks. Patient is baseline sedentary at home and lays around the house but previously used to be more active if She also states that the swelling in his lower extremities worsens at the end of the day and he patient keeps his legs propped up. Patient had a virtual colonoscopy about one week ago-he seemed to be very shaky after this probably per wife's report because of lack of alcohol Because of increasing swelling in the lower extremities as well as worsening shortness of breath patient went to see primary care physician 3/30 p.m. He usually has at least a beer in the morning and does start drinking early in the day-he was feeling a little jittery at the primary care physician office and started to feel uncomfortable as well as started to feel jittery and noted that he was diaphoretic. Blood pressure per report was 80/60 in the primary care office Patient presented to the ED and was told by primary care physician that he may need a stress test.  Point-of-care troponin 0.03, proBNP 46  Hemoglobin 10.9, platelet 134, MCV 77 INR 2.7 LFTs alkaline phosphatase 161, AST/ALT 96/73 Potassium 3.1 Magnesium not checked  2 VW CXR = stable postoperative chest EKG = sinus tachycardia PR interval 0.12, QRS axis LAFB, probable rate related changes across precordium in lead 3 and 4 because of sinus  tachycardia, meets Estes criteria for LVH   Past medical history-As per Problem list Chart reviewed as below- Reviewed  Consultants:  None  Procedures:  None  Antibiotics:  None   Subjective   Seems to be at usual baseline Denies shortness of breath denies chest pain denies fever denies chills denies Much more oriented Had a shower Telemetry benign   Objective    Interim History:   Telemetry: Sinus rhythm/sinus tach   Objective: Filed Vitals:   03/22/15 0324 03/22/15 0712 03/22/15 1004 03/22/15 1100  BP:  149/87 138/80   Pulse:  89 84   Temp: 98.8 F (37.1 C) 98.7 F (37.1 C)  98.4 F (36.9 C)  TempSrc: Oral Oral  Oral  Resp:  17    Height:      Weight:      SpO2:  95%      Intake/Output Summary (Last 24 hours) at 03/22/15 1203 Last data filed at 03/22/15 1100  Gross per 24 hour  Intake   2985 ml  Output   1701 ml  Net   1284 ml    Exam:  General: EOMI NCAT, Mallampati 2, lan, rhinophyma, no scleral icterus mild pallor Cardiovascular: S1-S2 slightly tachycardic PMI displaced laterally outside of the left fifth intercostal space, cannot appreciate JVD, no bruit Respiratory: Clinically clear no added sound Abdomen: Soft nontender nondistended? Hepatomegaly, no splenomegaly, CVA angles are nontender  Data Reviewed: Basic Metabolic Panel:  Recent Labs Lab 03/19/15 1753 03/20/15 0331 03/21/15 0338 03/21/15 0411 03/22/15 0253  NA 138 138 139  --  138  K 3.1* 3.0* 2.9*  --  2.9*  CL 97 101 103  --  105  CO2 23 25 29   --  30  GLUCOSE 125* 98 94  --  100*  BUN 8 6 <5*  --  6  CREATININE 0.98 0.74 0.70  --  0.68  CALCIUM 9.7 8.7 8.9  --  8.3*  MG  --   --   --  1.2* 1.2*   Liver Function Tests:  Recent Labs Lab 03/19/15 1753 03/20/15 1009 03/21/15 0338 03/22/15 0253  AST 96* 65* 60* 47*  ALT 73* 55* 48 40  ALKPHOS 161* 136* 130* 123*  BILITOT 1.2 0.9 1.0 0.7  PROT 6.9 6.4 6.3 6.0  ALBUMIN 3.9 3.3* 3.3* 3.0*   No results for  input(s): LIPASE, AMYLASE in the last 168 hours. No results for input(s): AMMONIA in the last 168 hours. CBC:  Recent Labs Lab 03/19/15 1753 03/20/15 0331 03/22/15 0253  WBC 7.5 5.7 6.3  NEUTROABS 5.9  --   --   HGB 10.9* 9.7* 9.6*  HCT 33.7* 30.6* 30.4*  MCV 77.6* 78.9 79.0  PLT 134* 130* 146*   Cardiac Enzymes:  Recent Labs Lab 03/19/15 1753 03/20/15 1009 03/20/15 1409 03/20/15 1952  TROPONINI <0.03 <0.03 <0.03 <0.03   BNP: Invalid input(s): POCBNP CBG: No results for input(s): GLUCAP in the last 168 hours.  Recent Results (from the past 240 hour(s))  MRSA PCR Screening     Status: None   Collection Time: 03/19/15 10:51 PM  Result Value Ref Range Status   MRSA by PCR NEGATIVE NEGATIVE Final    Comment:        The GeneXpert MRSA Assay (FDA approved for NASAL specimens only), is one component of a comprehensive MRSA colonization surveillance program. It is not intended to diagnose MRSA infection nor to guide or monitor treatment for MRSA infections.      Studies:              All Imaging reviewed and is as per above notation   Scheduled Meds: . carvedilol  3.125 mg Oral BID WC  . enoxaparin (LOVENOX) injection  40 mg Subcutaneous Q24H  . folic acid  1 mg Oral Daily  . latanoprost  1 drop Both Eyes QHS  . losartan  50 mg Oral Daily  . magnesium oxide  400 mg Oral Daily  . multivitamin with minerals  1 tablet Oral Daily  . potassium chloride  40 mEq Oral TID  . sodium chloride  3 mL Intravenous Q12H  . thiamine  100 mg Oral Daily   Or  . thiamine  100 mg Intravenous Daily  . warfarin  10 mg Oral ONCE-1800  . Warfarin - Pharmacist Dosing Inpatient   Does not apply q1800   Continuous Infusions: . sodium chloride 75 mL/hr at 03/22/15 1013     Assessment/Plan: 1. Probable vasovagal event leading to hypotension-patient might have been actively withdrawing yesterday. Keep on EtOH withdrawal protocol. Monitor on step down today. Repeat EKG this  morning, cycle troponins. Pro BNP not c/w acute heart failure given this is negative [46] however rate related changes on EKG cannot be ignored and he may have some mild LVH. DGUY--4/0/34 grade 1 diastolic dysfunction. no further cardiac workup required inpatient  2. 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 Patient drinks relatively heavily. He is also on Coumadin. His platelets reflect toxic effects of ethanol. This will need to be carefully discussed with him by his primary care  physician moving forward.  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 delirium tremens are resolved 4. Bilateral lower extremity swelling-? Hepatic versus cardiac-consider abdominal ultrasound on interval follow-up is primary care physician  5.  anemia-probably secondary to iron deficiency/toxic effect EtOH as above-guaiac stool. Transfusion threshold in this patient less than 7 6. 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  magnesium this morning 1.2,--replacing with potassium 40 twice a day-->3 times a day 03/22/15 , Mag-Ox 400 daily  7. Uncontrolled hypertension-patient on losartan 50 mg monotherapy. Added Coreg 3. 125 twice a day 03/21/15 8. History stage T3 N0 non-small cell lung cancer status post carboplatin/Taxol + chemotherapy radiation 2005-patient will need further follow-up as an outpatient with primary physician 9. History left upper extremity DVT-Coumadin per pharmacy. INR 1.78--today we will add Lovenox prophylactic as well  10. History hyperlipidemia-monitor LFTs carefully-usually ALT greater than 3 times normal limits is a relative contraindication. I will hold his atorvastatin for now pending result.  Code Status: Full  Family Communication: Long discussion with wife at bedside  Disposition Plan:transferred to telemetry, replace potassium aggressively, will probably need repeat potassium  in a.m.Marland Kitchen Potential discharge from meds/surgery in a.m. keep on CIWA overnight but public and discontinue as he is out of DTs)  Verneita Griffes, MD  [facility] Pager [phone] 03/22/2015, 12:03 PM    LOS: 3 days

## 2015-03-22 NOTE — Progress Notes (Signed)
On call SW called to come speak to patient and wife in regards to patients alcohol abuse as patient is more coherent today. SW to come see patient today or tomorrow before patient is discharged. Wife and patient updated.

## 2015-03-22 NOTE — Progress Notes (Signed)
Education materials printed out on Heart Failure and Cardiomyopathy per MD and family request.

## 2015-03-23 LAB — BASIC METABOLIC PANEL
Anion gap: 8 (ref 5–15)
BUN: <5 mg/dL — ABNORMAL LOW (ref 6–23)
CHLORIDE: 103 mmol/L (ref 96–112)
CO2: 26 mmol/L (ref 19–32)
Calcium: 8.8 mg/dL (ref 8.4–10.5)
Creatinine, Ser: 0.71 mg/dL (ref 0.50–1.35)
Glucose, Bld: 119 mg/dL — ABNORMAL HIGH (ref 70–99)
POTASSIUM: 3.7 mmol/L (ref 3.5–5.1)
SODIUM: 137 mmol/L (ref 135–145)

## 2015-03-23 LAB — PROTIME-INR
INR: 1.19 (ref 0.00–1.49)
Prothrombin Time: 15.2 seconds (ref 11.6–15.2)

## 2015-03-23 MED ORDER — CARVEDILOL 3.125 MG PO TABS: 3.1250 mg | ORAL_TABLET | Freq: Two times a day (BID) | ORAL | Status: DC

## 2015-03-23 MED ORDER — POTASSIUM CHLORIDE 20 MEQ/15ML (10%) PO SOLN: 40.0000 meq | Freq: Two times a day (BID) | ORAL | Status: DC

## 2015-03-23 NOTE — Evaluation (Signed)
Physical Therapy Evaluation Patient Details Name: ABDULAI Everett MRN: [id number] DOB: October 25, 1946 Today's Date: 03/23/2015   History of Present Illness  Pt admit for ETOH withdrawal.    Clinical Impression  Pt admitted with above diagnosis. Pt currently with functional limitations due to the deficits listed below (see PT Problem List). Pt will need rollator for home use.  Will be safe it pt uses rollator at all times.  Pt will need HHPT safety eval. Will follow acutely.    Pt will benefit from skilled PT to increase their independence and safety with mobility to allow discharge to the venue listed below.      Follow Up Recommendations Home health PT;Supervision - Intermittent (safety eval)    Equipment Recommendations  Other (comment) (rollator)    Recommendations for Other Services       Precautions / Restrictions Precautions Precautions: Fall Restrictions Weight Bearing Restrictions: No      Mobility  Bed Mobility Overal bed mobility: Independent                Transfers Overall transfer level: Independent                  Ambulation/Gait Ambulation/Gait assistance: Supervision Ambulation Distance (Feet): 450 Feet Assistive device: Rolling walker (2 wheeled) Gait Pattern/deviations: Step-through pattern;Decreased stride length   Gait velocity interpretation: <1.8 ft/sec, indicative of risk for recurrent falls General Gait Details: Pt ambulates well with RW without LOB.  Needed occasional cues to stay close to RW.  Pt ambulated without RW as well but is generally unsteady without RW.  Pt aware that PT recommends use of RW at home.    Stairs Stairs: Yes Stairs assistance: Supervision Stair Management: One rail Right;Alternating pattern;Step to pattern;Forwards Number of Stairs: 4 General stair comments: ascended steps with alternating feet and descended step to pattern.  Pt did not need cues or assist on steps.   Wheelchair Mobility    Modified Rankin  (Stroke Patients Only)       Balance Overall balance assessment: Needs assistance;History of Falls Sitting-balance support: No upper extremity supported;Feet supported Sitting balance-Leahy Scale: Good     Standing balance support: Bilateral upper extremity supported;During functional activity Standing balance-Leahy Scale: Fair Standing balance comment: Pt needs UE support for balance.  Unsteady with challenges to balance without UE support.              High level balance activites: Direction changes;Turns;Sudden stops;Head turns High Level Balance Comments: Does well with RW with above challenges.              Pertinent Vitals/Pain Pain Assessment: No/denies pain  VSS    Home Living Family/patient expects to be discharged to:: Private residence Living Arrangements: Spouse/significant other Available Help at Discharge: Family;Available PRN/intermittently Type of Home: House Home Access: Stairs to enter Entrance Stairs-Rails: Right Entrance Stairs-Number of Steps: 3 Home Layout: One level Home Equipment: Toilet riser      Prior Function Level of Independence: Independent               Hand Dominance   Dominant Hand: Right    Extremity/Trunk Assessment   Upper Extremity Assessment: Defer to OT evaluation           Lower Extremity Assessment: Generalized weakness         Communication   Communication: No difficulties  Cognition Arousal/Alertness: Awake/alert Behavior During Therapy: WFL for tasks assessed/performed Overall Cognitive Status: Within Functional Limits for tasks assessed  General Comments      Exercises        Assessment/Plan    PT Assessment Patient needs continued PT services  PT Diagnosis Generalized weakness   PT Problem List Decreased activity tolerance;Decreased balance;Decreased mobility;Decreased knowledge of use of DME;Decreased safety awareness;Decreased knowledge of precautions   PT Treatment Interventions DME instruction;Gait training;Functional mobility training;Therapeutic activities;Therapeutic exercise;Balance training;Patient/family education   PT Goals (Current goals can be found in the Care Plan section) Acute Rehab PT Goals Patient Stated Goal: to go home PT Goal Formulation: With patient Time For Goal Achievement: 03/30/15 Potential to Achieve Goals: Good    Frequency Min 3X/week   Barriers to discharge Decreased caregiver support (wife works)      Building control surveyor of Session Equipment Utilized During Treatment: Gait belt Activity Tolerance: Patient tolerated treatment well Patient left: in bed;with call bell/phone within reach;with family/visitor present Nurse Communication: Mobility status         Time: [phone] PT Time Calculation (min) (ACUTE ONLY): 24 min   Charges:   PT Evaluation $Initial PT Evaluation Tier I: 1 Procedure PT Treatments $Gait Training: 8-22 mins   PT G CodesDenice Everett 2015/03/28, 12:26 PM [name],PT Acute Rehabilitation [phone] (pager)

## 2015-03-23 NOTE — Social Work (Signed)
Clinical Social Work Department BRIEF PSYCHOSOCIAL ASSESSMENT 03/23/2015  Patient:  [name], [name]     Account Number:  [id number]     Admit date:  03/19/2015  Clinical Social Worker:  Lenord Fellers  Date/Time:  03/23/2015 05:04 PM  Referred by:  Physician  Date Referred:  03/19/2015 Referred for  Substance Abuse   Other Referral:   Interview type:  Patient Other interview type:   Patient was accompanied by wife.    PSYCHOSOCIAL DATA Living Status:  FAMILY Admitted from facility:   Level of care:   Primary support name:  [name] Primary support relationship to patient:  SPOUSE Degree of support available:   Patient lives with wife who is very supportive of patient and his recovery process.    CURRENT CONCERNS Current Concerns  Substance Abuse   Other Concerns:    SOCIAL WORK ASSESSMENT / PLAN CSW met with patien tin order to provide substance abuse services. CSW provided patient and wife with a list of substance abuse services, inpatient and outpatient as well as a list of AA meetings in order to start treatment and gain support immediately. Patient stated that he would like to stop, but wanted to stop on his own because he had never tried to stop before, so he felt that he would stop if he just tried. THis CSW encouraged patient to get as mucy help and support as possible in order to faciliate the treatment process and gain greater success.   Assessment/plan status:  Information/Referral to [employer] Other assessment/ plan:   Information/referral to community resources:    PATIENT'S/FAMILY'S RESPONSE TO PLAN OF CARE: Patient and wife in agreement with resources and preparing for discharge. No further needs.

## 2015-03-23 NOTE — Discharge Summary (Signed)
Physician Discharge Summary  [name] WVP:[id number] DOB: Aug 25, 1946 DOA: 03/19/2015  PCP: Gennette Pac, MD  Admit date: 03/19/2015 Discharge date: 03/23/2015  Time spent: 35 minutes  Recommendations for Outpatient Follow-up:  1. Patient counselled to quit drinking completely  2. Will need further follow-up and potential complete metabolic panel in about one week at primary care physician's office 3. Consider outpatient ultrasound abdomen-he probably has at least hepatic steatosis 4. Consider outpatient stress testing-he has risk factors of mainly hyperlipidemia as well as hypertension but did not meet specific criteria for stress testing during this hospital stay 5. Please recheck INR level in about 3-4 days 6. Please carefully consider consider use of statin in someone with elevated LFTs 7. He has been given some resources by Education officer, [employer] for ethanol cessation 8. Up titrate Coreg that was started this hospital stay for uncontrolled blood pressure as an outpatient as you see fit  Discharge Diagnoses:  Principal Problem:   Alcohol withdrawal Active Problems:   Hereditary and idiopathic peripheral neuropathy   Near syncope   Hypertension   Hyperlipidemia   Warfarin-induced coagulopathy   Discharge Condition: Fair  Diet recommendation: Low-salt heart healthy  Filed Weights   03/19/15 1649  Weight: 94.348 kg (208 lb)    History of present illness:  7 ? h/o Lung CA T3/N0 NSCLC s/p L Upper lobectomy 03/2004 [[facility]], previously followed Dr. Truddie Coco [Rx Carboplatin/Taxo-completed all therapy inc XRT 10/2004], prior Lft UE DVT on Coumadin,Chronic ETOH use with ~8-10 ETOH drinks [Vodka]/ day, Hld, [place] premature CAD [brother in 40's] started to have episodic ? bilateral LE edema over the past 3-4 mo. His wife of 10 yrs noticed that on vacation recently patient had increasing dyspnea on exertion with the difficulty walking flat more than a couple of blocks. Patient is  baseline sedentary at home and lays around the house but previously used to be more active if She also states that the swelling in his lower extremities worsens at the end of the day and he patient keeps his legs propped up. Patient had a virtual colonoscopy about one week ago-he seemed to be very shaky after this probably per wife's report because of lack of alcohol Because of increasing swelling in the lower extremities as well as worsening shortness of breath patient went to see primary care physician 3/30 p.m. Diaphoretic, hypotensive 80/60 and sent to the ED, point-of-care troponin 0.03, proBNP less than 50 He ruled out for acute coronary syndrome Noted lab work consistent with hepatic steatosis, chest x-ray within normal limits and potential LVH on EKG on admission See below  Hospital Course:   1. Probable vasovagal event leading to hypotension-patient might have been actively withdrawing on admission.Marland Kitchen He was kept on step down unit initially and transferred to [facility] 4/2 after he had completely withdrawn and CIWA was less than 5. Repeat EKG this morning, cycle troponins. Pro BNP not c/w acute heart failure given this is negative [46] however rate related changes on EKG cannot be ignored and he may have some mild LVH. NIOE--7/0/35 grade 1 diastolic dysfunction. no further cardiac workup required inpatient  2. 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 Patient drinks relatively heavily. He is also on Coumadin. His platelets reflect toxic effects of ethanol. This will need to be carefully discussed with him by his primary care physician moving forward. --Cage questionnaire reveals 3/4 response is significant for ethanolism-he was given some resources by nursing prior to discharge 3. delirium tremens are resolved 4. Bilateral lower extremity swelling-? Hepatic  versus cardiac-consider abdominal ultrasound on interval follow-up is primary care physician  5. anemia-probably  secondary to iron deficiency/toxic effect EtOH as above-guaiac stool. Transfusion threshold in this patient less than 7-his hemoglobin normalized on 4/2 to around 9. He will need an outpatient colonoscopy if not done previously as he has mild microcytosis 6. 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 magnesium was low during hospital stay and he was given Mag-Ox. He'll be discharged home on potassium supplementation and needs labs on follow-up 7. Uncontrolled hypertension-patient on losartan 50 mg monotherapy. Added Coreg 3. 125 twice a day 03/21/15 8. History stage T3 N0 non-small cell lung cancer status post carboplatin/Taxol + chemotherapy radiation 2005-patient will need further follow-up as an outpatient with primary physician 9. History left upper extremity DVT-Coumadin per pharmacy. INR 1.78--today we will add Lovenox prophylactic as well -as an outpatient he should not take any multivitamins and should continue his Coumadin with close monitoring. 10. History hyperlipidemia-monitor LFTs carefully-usually ALT greater than 3 times normal limits is a relative contraindication. Consider outpatient discussion with primary care physician regarding this  Discharge Exam: Filed Vitals:   03/23/15 1012  BP: 164/94  Pulse: 108  Temp: 98.8 F (37.1 C)  Resp: 18   Alert oriented in no apparent distress  General: EOMI NCAT Cardiovascular: S1-S2 no murmur rub or gallop Respiratory: Clinically clear  Discharge Instructions   Discharge Instructions    Diet - low sodium heart healthy    Complete by:  As directed      Discharge instructions    Complete by:  As directed   Please follow with Primary MD Consider out-patient liver ultrasound and stress test non -emergently Please quit drinking ethanol Take potassium until your regular MD tells you to stop based on labwork-get labs at his office in 5-7 days     Increase activity slowly    Complete by:  As directed            Current Discharge Medication List    START taking these medications   Details  carvedilol (COREG) 3.125 MG tablet Take 1 tablet (3.125 mg total) by mouth 2 (two) times daily with a meal. Qty: 60 tablet, Refills: 0    potassium chloride 20 MEQ/15ML (10%) SOLN Take 30 mLs (40 mEq total) by mouth 2 (two) times daily. Qty: 450 mL, Refills: 0      CONTINUE these medications which have NOT CHANGED   Details  atorvastatin (LIPITOR) 10 MG tablet Take 10 mg by mouth daily.    latanoprost (XALATAN) 0.005 % ophthalmic solution Place 1 drop into both eyes at bedtime.    losartan (COZAAR) 50 MG tablet Take 50 mg by mouth daily.    Naphazoline-Glycerin (CLEAR EYES REDNESS RELIEF OP) Apply 2 drops to eye daily.    warfarin (COUMADIN) 5 MG tablet Take 1 tablet (5 mg total) by mouth as directed. Qty: 125 tablet, Refills: 4   Associated Diagnoses: Embolism and thrombosis of unspecified site    acetaminophen-codeine 120-12 MG/5ML solution Take 2.5 mLs by mouth every 4 (four) hours as needed.  Refills: 0       No Known Allergies    The results of significant diagnostics from this hospitalization (including imaging, microbiology, ancillary and laboratory) are listed below for reference.    Significant Diagnostic Studies: Dg Chest 2 View  03/19/2015   CLINICAL DATA:  Syncope and unsteady gait for 1 day. Ex-smoker. History of hypertension and lung cancer. Initial encounter.  EXAM: CHEST  2 VIEW  COMPARISON:  CT 12/02/2014.  Radiographs 11/13/2014.  FINDINGS: Stable postsurgical changes status post left thoracotomy and upper lobe resection. Stable mild scarring at the left apex and left lung base. The right lung is clear. The heart size and mediastinal contours are stable. Left subclavian venous stent noted. Left-sided thoracotomy defects and a mid thoracic compression deformity appear stable. There are no acute osseous findings.  IMPRESSION: Stable postoperative chest.  No acute cardiopulmonary  process.   Electronically Signed   By: Richardean Sale M.D.   On: 03/19/2015 18:43   Ct Virtual Colonoscopy Diagnostic  03/12/2015   CLINICAL DATA:  Family history of carcinoma of the colon, diverticulosis, history of left lung carcinoma with lobectomy 11 years ago  EXAM: CT VIRTUAL COLONOSCOPY DIAGNOSTIC  TECHNIQUE: The patient was given a standard magnesium citrate and suppositories bowel preparation with Gastrografin and barium for fluid and stool tagging respectively. The quality of the bowel preparation is moderate. Automated CO2 insufflation of the colon was performed prior to image acquisition and colonic distention is moderate. Image post processing was used to generate a 3D endoluminal fly-through projection of the colon and to electronically subtract stool/fluid as appropriate.  COMPARISON:  CT abdomen pelvis of 08/13/2009  FINDINGS: 2D images of the colon were performed in supine, prone, as well as right lateral decubitus positions. 3D images were reviewed in supine and prone positions. There is fairly good distension of the colon with very little residual fluid and feces which appears well tagged. There are small diverticula scattered throughout the entire colon. The colon is somewhat tortuous particularly within the rectosigmoid colon and somewhat elongated. The right colon is well-visualized. The ileocecal valve appears normal. No clinically significant polypoid lesion is seen.  IMPRESSION: 1. Elongated and tortuous colon with scattered small diverticula throughout the colon. 2. No clinically significant polypoid lesion or constricting lesion is seen.  Virtual colonoscopy is not designed to detect diminutive polyps (i.e., less than or equal to 5 mm), the presence or absence of which may not affect clinical management.  CT ABDOMEN AND PELVIS WITHOUT CONTRAST  FINDINGS: The lung bases are clear. The liver appears diffusely low in attenuation most consistent with fatty infiltration. No focal hepatic  abnormality is seen. No calcified gallstones are noted although sludge cannot be excluded. The pancreas is normal in size and the pancreatic duct is not dilated. The adrenal glands and spleen are unremarkable with a small splenule noted medially. The calcification overlying the left upper renal hilus may be vascular in origin. No hydronephrosis is seen. No additional renal calculi are evident. The abdominal aorta is normal in caliber with moderate atheromatous change present. No adenopathy is seen.  The urinary bladder is not well distended. The prostate is normal in size for age. Multiple small diverticula are scattered throughout the colon. The terminal ileum is unremarkable. An old healed right lower posterior rib fracture is noted. The lumbar vertebrae are in normal alignment with relatively normal disc spaces. There is some degenerative change involving the facet joints of L4-5 and L5-S1.  1. Low-attenuation of the liver is most consistent with diffuse fatty infiltration. Correlate with LFTs. 2. Multiple small colonic diverticula throughout the colon. 3. The terminal ileum is unremarkable.   Electronically Signed   By: Ivar Drape M.D.   On: 03/12/2015 10:57    Microbiology: Recent Results (from the past 240 hour(s))  MRSA PCR Screening     Status: None   Collection Time: 03/19/15 10:51 PM  Result Value Ref Range  Status   MRSA by PCR NEGATIVE NEGATIVE Final    Comment:        The GeneXpert MRSA Assay (FDA approved for NASAL specimens only), is one component of a comprehensive MRSA colonization surveillance program. It is not intended to diagnose MRSA infection nor to guide or monitor treatment for MRSA infections.      Labs: Basic Metabolic Panel:  Recent Labs Lab 03/19/15 1753 03/20/15 0331 03/21/15 0338 03/21/15 0411 03/22/15 0253 03/23/15 1000  NA 138 138 139  --  138 137  K 3.1* 3.0* 2.9*  --  2.9* 3.7  CL 97 101 103  --  105 103  CO2 23 25 29   --  30 26  GLUCOSE 125* 98  94  --  100* 119*  BUN 8 6 <5*  --  6 <5*  CREATININE 0.98 0.74 0.70  --  0.68 0.71  CALCIUM 9.7 8.7 8.9  --  8.3* 8.8  MG  --   --   --  1.2* 1.2*  --    Liver Function Tests:  Recent Labs Lab 03/19/15 1753 03/20/15 1009 03/21/15 0338 03/22/15 0253  AST 96* 65* 60* 47*  ALT 73* 55* 48 40  ALKPHOS 161* 136* 130* 123*  BILITOT 1.2 0.9 1.0 0.7  PROT 6.9 6.4 6.3 6.0  ALBUMIN 3.9 3.3* 3.3* 3.0*   No results for input(s): LIPASE, AMYLASE in the last 168 hours. No results for input(s): AMMONIA in the last 168 hours. CBC:  Recent Labs Lab 03/19/15 1753 03/20/15 0331 03/22/15 0253  WBC 7.5 5.7 6.3  NEUTROABS 5.9  --   --   HGB 10.9* 9.7* 9.6*  HCT 33.7* 30.6* 30.4*  MCV 77.6* 78.9 79.0  PLT 134* 130* 146*   Cardiac Enzymes:  Recent Labs Lab 03/19/15 1753 03/20/15 1009 03/20/15 1409 03/20/15 1952  TROPONINI <0.03 <0.03 <0.03 <0.03   BNP: BNP (last 3 results)  Recent Labs  03/19/15 1753  BNP 48.2    ProBNP (last 3 results) No results for input(s): PROBNP in the last 8760 hours.  CBG: No results for input(s): GLUCAP in the last 168 hours.     SignedNita Sells  [facility] 03/23/2015, 12:39 PM

## 2015-03-27 ENCOUNTER — Other Ambulatory Visit: Payer: Self-pay | Admitting: Gastroenterology

## 2015-03-27 DIAGNOSIS — R1084 Generalized abdominal pain: Secondary | ICD-10-CM

## 2015-03-28 ENCOUNTER — Encounter (HOSPITAL_COMMUNITY): Payer: Self-pay

## 2015-03-28 ENCOUNTER — Ambulatory Visit (HOSPITAL_COMMUNITY): Payer: Medicare Other

## 2015-03-28 ENCOUNTER — Ambulatory Visit (HOSPITAL_COMMUNITY)
Admission: RE | Admit: 2015-03-28 | Discharge: 2015-03-28 | Disposition: A | Discharge status: Home / Self Care | Payer: Medicare Other | Source: Ambulatory Visit | Attending: Gastroenterology | Admitting: Gastroenterology

## 2015-03-28 DIAGNOSIS — I708 Atherosclerosis of other arteries: Secondary | ICD-10-CM | POA: Insufficient documentation

## 2015-03-28 DIAGNOSIS — K760 Fatty (change of) liver, not elsewhere classified: Secondary | ICD-10-CM | POA: Diagnosis not present

## 2015-03-28 DIAGNOSIS — R1084 Generalized abdominal pain: Secondary | ICD-10-CM

## 2015-03-28 DIAGNOSIS — K5730 Diverticulosis of large intestine without perforation or abscess without bleeding: Secondary | ICD-10-CM | POA: Diagnosis not present

## 2015-03-28 DIAGNOSIS — R109 Unspecified abdominal pain: Secondary | ICD-10-CM | POA: Diagnosis present

## 2015-03-28 MED ORDER — IOHEXOL 300 MG/ML  SOLN
80.0000 mL | Freq: Once | INTRAMUSCULAR | Status: AC | PRN
  Administered 2015-03-28: 80 mL via INTRAVENOUS

## 2015-04-01 ENCOUNTER — Emergency Department (HOSPITAL_COMMUNITY): Payer: Medicare Other

## 2015-04-01 ENCOUNTER — Encounter (HOSPITAL_COMMUNITY): Payer: Self-pay

## 2015-04-01 ENCOUNTER — Emergency Department (HOSPITAL_COMMUNITY)
Admission: EM | Admit: 2015-04-01 | Discharge: 2015-04-01 | Disposition: A | Discharge status: Home / Self Care | Payer: Medicare Other | Attending: Emergency Medicine | Admitting: Emergency Medicine

## 2015-04-01 DIAGNOSIS — R404 Transient alteration of awareness: Secondary | ICD-10-CM | POA: Diagnosis not present

## 2015-04-01 DIAGNOSIS — R200 Anesthesia of skin: Secondary | ICD-10-CM | POA: Insufficient documentation

## 2015-04-01 DIAGNOSIS — E785 Hyperlipidemia, unspecified: Secondary | ICD-10-CM | POA: Diagnosis not present

## 2015-04-01 DIAGNOSIS — Z85118 Personal history of other malignant neoplasm of bronchus and lung: Secondary | ICD-10-CM | POA: Diagnosis not present

## 2015-04-01 DIAGNOSIS — I10 Essential (primary) hypertension: Secondary | ICD-10-CM | POA: Diagnosis not present

## 2015-04-01 DIAGNOSIS — Z7901 Long term (current) use of anticoagulants: Secondary | ICD-10-CM | POA: Insufficient documentation

## 2015-04-01 DIAGNOSIS — Z79899 Other long term (current) drug therapy: Secondary | ICD-10-CM | POA: Diagnosis not present

## 2015-04-01 DIAGNOSIS — Z87891 Personal history of nicotine dependence: Secondary | ICD-10-CM | POA: Insufficient documentation

## 2015-04-01 DIAGNOSIS — Z8619 Personal history of other infectious and parasitic diseases: Secondary | ICD-10-CM | POA: Diagnosis not present

## 2015-04-01 DIAGNOSIS — Z8719 Personal history of other diseases of the digestive system: Secondary | ICD-10-CM | POA: Diagnosis not present

## 2015-04-01 DIAGNOSIS — R4182 Altered mental status, unspecified: Secondary | ICD-10-CM | POA: Diagnosis present

## 2015-04-01 DIAGNOSIS — Z8669 Personal history of other diseases of the nervous system and sense organs: Secondary | ICD-10-CM | POA: Insufficient documentation

## 2015-04-01 LAB — ETHANOL

## 2015-04-01 LAB — COMPREHENSIVE METABOLIC PANEL
ALBUMIN: 3.7 g/dL (ref 3.5–5.2)
ALT: 44 U/L (ref 0–53)
AST: 64 U/L — AB (ref 0–37)
Alkaline Phosphatase: 146 U/L — ABNORMAL HIGH (ref 39–117)
Anion gap: 11 (ref 5–15)
BUN: 15 mg/dL (ref 6–23)
CALCIUM: 9.6 mg/dL (ref 8.4–10.5)
CHLORIDE: 100 mmol/L (ref 96–112)
CO2: 23 mmol/L (ref 19–32)
CREATININE: 1.06 mg/dL (ref 0.50–1.35)
GFR calc Af Amer: 82 mL/min — ABNORMAL LOW (ref >90)
GFR, EST NON AFRICAN AMERICAN: 71 mL/min — AB (ref >90)
Glucose, Bld: 108 mg/dL — ABNORMAL HIGH (ref 70–99)
Potassium: 4.9 mmol/L (ref 3.5–5.1)
Sodium: 134 mmol/L — ABNORMAL LOW (ref 135–145)
Total Bilirubin: 0.6 mg/dL (ref 0.3–1.2)
Total Protein: 7.4 g/dL (ref 6.0–8.3)

## 2015-04-01 LAB — URINALYSIS, ROUTINE W REFLEX MICROSCOPIC
BILIRUBIN URINE: NEGATIVE
Glucose, UA: NEGATIVE mg/dL
Hgb urine dipstick: NEGATIVE
Ketones, ur: NEGATIVE mg/dL
LEUKOCYTES UA: NEGATIVE
Nitrite: NEGATIVE
PH: 5.5 (ref 5.0–8.0)
Protein, ur: NEGATIVE mg/dL
Specific Gravity, Urine: 1.025 (ref 1.005–1.030)
Urobilinogen, UA: 0.2 mg/dL (ref 0.0–1.0)

## 2015-04-01 LAB — RAPID URINE DRUG SCREEN, HOSP PERFORMED
AMPHETAMINES: NOT DETECTED
BENZODIAZEPINES: POSITIVE — AB
Barbiturates: NOT DETECTED
COCAINE: NOT DETECTED
OPIATES: NOT DETECTED
Tetrahydrocannabinol: POSITIVE — AB

## 2015-04-01 LAB — CBC
HCT: 40.9 % (ref 39.0–52.0)
Hemoglobin: 12.7 g/dL — ABNORMAL LOW (ref 13.0–17.0)
MCH: 24.2 pg — AB (ref 26.0–34.0)
MCHC: 31.1 g/dL (ref 30.0–36.0)
MCV: 77.9 fL — AB (ref 78.0–100.0)
Platelets: 617 10*3/uL — ABNORMAL HIGH (ref 150–400)
RBC: 5.25 MIL/uL (ref 4.22–5.81)
RDW: 18.5 % — AB (ref 11.5–15.5)
WBC: 11.2 10*3/uL — ABNORMAL HIGH (ref 4.0–10.5)

## 2015-04-01 LAB — APTT: APTT: 26 s (ref 24–37)

## 2015-04-01 LAB — TROPONIN I: Troponin I: 0.04 ng/mL — ABNORMAL HIGH (ref <0.031)

## 2015-04-01 LAB — PROTIME-INR
INR: 1.23 (ref 0.00–1.49)
Prothrombin Time: 15.7 seconds — ABNORMAL HIGH (ref 11.6–15.2)

## 2015-04-01 MED ORDER — LORAZEPAM 2 MG/ML IJ SOLN
1.0000 mg | Freq: Once | INTRAMUSCULAR | Status: AC
  Administered 2015-04-01: 1 mg via INTRAVENOUS
  Filled 2015-04-01: qty 1

## 2015-04-01 MED ORDER — SODIUM CHLORIDE 0.9 % IV BOLUS (SEPSIS)
1000.0000 mL | Freq: Once | INTRAVENOUS | Status: AC
  Administered 2015-04-01: 1000 mL via INTRAVENOUS

## 2015-04-01 MED ORDER — SODIUM CHLORIDE 0.9 % IV SOLN
100.0000 mL/h | INTRAVENOUS | Status: DC
  Administered 2015-04-01: 100 mL/h via INTRAVENOUS

## 2015-04-01 MED ORDER — FOLIC ACID 1 MG PO TABS
1.0000 mg | ORAL_TABLET | Freq: Once | ORAL | Status: AC
  Administered 2015-04-01: 1 mg via ORAL
  Filled 2015-04-01: qty 1

## 2015-04-01 MED ORDER — SODIUM CHLORIDE 0.9 % IV BOLUS (SEPSIS)
500.0000 mL | Freq: Once | INTRAVENOUS | Status: AC
  Administered 2015-04-01: 500 mL via INTRAVENOUS

## 2015-04-01 MED ORDER — VITAMIN B-1 100 MG PO TABS
100.0000 mg | ORAL_TABLET | Freq: Once | ORAL | Status: AC
  Administered 2015-04-01: 100 mg via ORAL
  Filled 2015-04-01: qty 1

## 2015-04-01 NOTE — Discharge Instructions (Signed)
As discussed, your evaluation today has been largely reassuring.  But, it is important that you monitor your condition carefully, and do not hesitate to return to the ED if you develop new, or concerning changes in your condition.  Please be sure to stay well-hydrated, take all medication as directed.  Otherwise, please follow-up with your physician for appropriate ongoing care.   Altered Mental Status Altered mental status most often refers to an abnormal change in your responsiveness and awareness. It can affect your speech, thought, mobility, memory, attention span, or alertness. It can range from slight confusion to complete unresponsiveness (coma). Altered mental status can be a sign of a serious underlying medical condition. Rapid evaluation and medical treatment is necessary for patients having an altered mental status. CAUSES   Low blood sugar (hypoglycemia) or diabetes.  Severe loss of body fluids (dehydration) or a body salt (electrolyte) imbalance.  A stroke or other neurologic problem, such as dementia or delirium.  A head injury or tumor.  A drug or alcohol overdose.  Exposure to toxins or poisons.  Depression, anxiety, and stress.  A low oxygen level (hypoxia).  An infection.  Blood loss.  Twitching or shaking (seizure).  Heart problems, such as heart attack or heart rhythm problems (arrhythmias).  A body temperature that is too low or too high (hypothermia or hyperthermia). DIAGNOSIS  A diagnosis is based on your history, symptoms, physical and neurologic examinations, and diagnostic tests. Diagnostic tests may include:  Measurement of your blood pressure, pulse, breathing, and oxygen levels (vital signs).  Blood tests.  Urine tests.  X-ray exams.  A computerized magnetic scan (magnetic resonance imaging, MRI).  A computerized X-ray scan (computed tomography, CT scan). TREATMENT  Treatment will depend on the cause. Treatment may include:  Management  of an underlying medical or mental health condition.  Critical care or support in the hospital. [place]   Only take over-the-counter or prescription medicines for pain, discomfort, or fever as directed by your caregiver.  Manage underlying conditions as directed by your caregiver.  Eat a healthy, well-balanced diet to maintain strength.  Join a support group or prevention program to cope with the condition or trauma that caused the altered mental status. Ask your caregiver to help choose a program that works for you.  Follow up with your caregiver for further examination, therapy, or testing as directed. SEEK MEDICAL CARE IF:   You feel unwell or have chills.  You or your family notice a change in your behavior or your alertness.  You have trouble following your caregiver's treatment plan.  You have questions or concerns. SEEK IMMEDIATE MEDICAL CARE IF:   You have a rapid heartbeat or have chest pain.  You have difficulty breathing.  You have a fever.  You have a headache with a stiff neck.  You cough up blood.  You have blood in your urine or stool.  You have severe agitation or confusion. MAKE SURE YOU:   Understand these instructions.  Will watch your condition.  Will get help right away if you are not doing well or get worse. Document Released: 05/26/2010 Document Revised: 02/28/2012 Document Reviewed: 05/26/2010 [facility] Patient Information 2015 [place], [place]. This information is not intended to replace advice given to you by your health care provider. Make sure you discuss any questions you have with your health care provider.
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
## 2015-04-01 NOTE — ED Notes (Signed)
Pt given black coffee.  Okay'd by Dr. Vanita Panda.

## 2015-04-01 NOTE — ED Notes (Signed)
Patient transported to CT 
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
## 2015-04-04 ENCOUNTER — Other Ambulatory Visit: Payer: Self-pay | Admitting: Dermatology

## 2015-06-16 ENCOUNTER — Other Ambulatory Visit: Payer: Self-pay

## 2015-07-10 ENCOUNTER — Encounter: Payer: Self-pay | Admitting: Cardiology

## 2015-07-10 ENCOUNTER — Ambulatory Visit (INDEPENDENT_AMBULATORY_CARE_PROVIDER_SITE_OTHER): Payer: Medicare Other | Admitting: Cardiology

## 2015-07-10 VITALS — BP 110/64 | HR 95 | Ht 69.0 in | Wt 198.4 lb

## 2015-07-10 DIAGNOSIS — Z8249 Family history of ischemic heart disease and other diseases of the circulatory system: Secondary | ICD-10-CM | POA: Diagnosis not present

## 2015-07-10 NOTE — Progress Notes (Signed)
Cardiology Office Note   Date:  07/10/2015   ID:  [name], DOB 1946/02/07, MRN [id number]  PCP:  Gennette Pac, MD    Chief Complaint  Patient presents with  . New Evaluation    sleep apnea spell, diastolic dysfunction      History of Present Illness: [name] is a 69 y.o. male who presents for evaluation of witnessed apnea and diastolic dysfunction.  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  Towards the end of March he started having problems with LE edema and had chills and low BP and was sent to the ER with DT's and was in stepdown ICU for 3 days.  Echo showed diastolic dysfunction.  There was also some concern in the hospital that he may have OSA.  His wife states that he snores and she has noted episodes where he stops breathing.  He now presents for evaluation.  He denies any chest pain, SOB or DOE.  Since discharge from [facility] and stopped ETOH, he has not had any further LE edema.  His wife noticed that he would have apnea in his sleep with loud snoring when he was drinking a lot of ETOH.  His wife says that since stopping ETOH and losing weight, his apnea has improved and his snoring has improved.      Past Medical History  Diagnosis Date  . Hypertension   . Hyperlipidemia   . Polio   . Hereditary and idiopathic peripheral neuropathy 09/26/2014  . Cancer     lung cancer  . Glaucoma   . Left subclavian vein thrombosis   . Ischemic colitis     Past Surgical History  Procedure Laterality Date  . Hernia repair      left ingunal  . Lung removal, partial    . Tonsillectomy       Current Outpatient Prescriptions  Medication Sig Dispense Refill  . atorvastatin (LIPITOR) 10 MG tablet Take 10 mg by mouth daily.    . carvedilol (COREG) 3.125 MG tablet Take 3.125 mg by mouth 2 (two) times daily with a meal.     . ipratropium (ATROVENT HFA) 17 MCG/ACT inhaler Inhale 2 puffs into the lungs every 6 (six) hours. For wheezing    . latanoprost (XALATAN) 0.005 % ophthalmic solution Place 1 drop into both eyes at bedtime.    Marland Kitchen losartan (COZAAR) 50 MG tablet Take 50 mg by mouth daily.    . Multiple Vitamin (MULTIVITAMIN) capsule Take 1 capsule by mouth daily.    . Naphazoline-Glycerin (CLEAR EYES REDNESS RELIEF OP) Apply 2 drops to eye daily.    Marland Kitchen warfarin (COUMADIN) 5 MG tablet Take 1 tablet (5 mg total) by mouth as directed. (Patient taking differently: Take 5-15 mg by mouth as directed. ) 125 tablet 4   No current facility-administered medications for this visit.    Allergies:   Review of patient's allergies indicates no known allergies.    Social History:  The patient  reports that he has quit smoking. His smoking use included Cigarettes. He has a 80 pack-year smoking history. He has never used smokeless tobacco. He reports that he drinks alcohol. He reports that he does not use illicit drugs.   Family History:  The patient's family history includes Cancer in  his mother; Heart Problems in his brother; Heart attack in his father; Heart attack (age of onset: 82) in his brother; Heart disease in his father; Hyperlipidemia in his mother.    ROS:  Please see the history of present illness.   Otherwise, review of systems are positive for none.   All other systems are reviewed and negative.    PHYSICAL EXAM: VS:  BP 110/64 mmHg  Pulse 95  Ht '5\' 9"'$  (1.753 m)  Wt 198 lb 6.4 oz (89.994 kg)  BMI 29.29 kg/m2  SpO2 94% , BMI Body mass index is 29.29 kg/(m^2). GEN: Well nourished, well developed, in no acute distress HEENT: normal Neck: no JVD, carotid bruits, or masses Cardiac: RRR; no murmurs, rubs, or gallops,no edema  Respiratory:  clear to auscultation bilaterally, normal work of breathing GI: soft, nontender, nondistended, + BS MS: no deformity or atrophy Skin: warm and dry, no rash Neuro:   Strength and sensation are intact Psych: euthymic mood, full affect   EKG:  EKG was not ordered today.    Recent Labs: 03/19/2015: B Natriuretic Peptide 48.2 03/20/2015: TSH 3.378 03/22/2015: Magnesium 1.2* 04/01/2015: ALT 44; BUN 15; Creatinine, Ser 1.06; Hemoglobin 12.7*; Platelets 617*; Potassium 4.9; Sodium 134*    Lipid Panel No results found for: CHOL, TRIG, HDL, CHOLHDL, VLDL, LDLCALC, LDLDIRECT    Wt Readings from Last 3 Encounters:  07/10/15 198 lb 6.4 oz (89.994 kg)  03/19/15 208 lb (94.348 kg)  09/26/14 214 lb 9.6 oz (97.342 kg)        ASSESSMENT AND PLAN:  1.  Grade I Diastolic dysfunction by Echo in April with low normal LVF EF 50-55%. I did caution him about the toxic effects of ETOH.  He has stopped drinking.    2.  Snoring and witnessed apnea events at night. This has improved since stopping ETOH.   I will set him up for a home sleep study. 3.  HTN - controlled on Coreg and losartan 4.  Family history of CAD at an early age in his brother.  His other risk factors include HTN, dyslipidemia and remote tobacco use.  I will get a stress myoview to rule out ischemia.   5.  LE edema - resolved after stopping ETOH    Current medicines are reviewed at length with the patient today.  The patient does not have concerns regarding medicines.  The following changes have been made:  no change  Labs/ tests ordered today: See above Assessment and Plan No orders of the defined types were placed in this encounter.     Disposition:   FU with me yearly if stress test is normal Signed, Sueanne Margarita, MD  07/10/2015 2:27 PM    [facility], [place], [place]  [address] Phone: [phone]; Fax: [phone]

## 2015-07-10 NOTE — Patient Instructions (Addendum)
Medication Instructions:  Your physician recommends that you continue on your current medications as directed. Please refer to the Current Medication list given to you today.   Labwork: None  Testing/Procedures: Dr. Radford Pax recommends you have a [facility]. You will be called with this information after the required paperwork is completed.  Dr. Radford Pax recommends you have an [facility].  Follow-Up: Your physician wants you to follow-up in: 1 year with Dr. Radford Pax. You will receive a reminder letter in the mail two months in advance. If you don't receive a letter, please call our office to schedule the follow-up appointment.   Any Other Special Instructions Will Be Listed Below (If Applicable).

## 2015-07-22 ENCOUNTER — Telehealth (HOSPITAL_COMMUNITY): Payer: Self-pay

## 2015-07-22 NOTE — Telephone Encounter (Signed)
Patient given detailed instructions per Myocardial Perfusion Study Information Sheet for test on 07-25-2015 at 0915. Patient Notified to arrive 15 minutes early, and that it is imperative to arrive on time for appointment to keep from having the test rescheduled. Patient verbalized understanding. Oletta Lamas, Arath Kaigler A

## 2015-07-25 ENCOUNTER — Ambulatory Visit (HOSPITAL_COMMUNITY): Payer: Medicare Other | Attending: Cardiovascular Disease

## 2015-07-25 DIAGNOSIS — I10 Essential (primary) hypertension: Secondary | ICD-10-CM | POA: Diagnosis not present

## 2015-07-25 DIAGNOSIS — R0609 Other forms of dyspnea: Secondary | ICD-10-CM | POA: Diagnosis not present

## 2015-07-25 DIAGNOSIS — R9439 Abnormal result of other cardiovascular function study: Secondary | ICD-10-CM | POA: Insufficient documentation

## 2015-07-25 DIAGNOSIS — Z79899 Other long term (current) drug therapy: Secondary | ICD-10-CM | POA: Diagnosis not present

## 2015-07-25 DIAGNOSIS — Z8249 Family history of ischemic heart disease and other diseases of the circulatory system: Secondary | ICD-10-CM | POA: Diagnosis not present

## 2015-07-25 DIAGNOSIS — Z87891 Personal history of nicotine dependence: Secondary | ICD-10-CM | POA: Diagnosis not present

## 2015-07-25 LAB — MYOCARDIAL PERFUSION IMAGING
CHL CUP MPHR: 153 {beats}/min
CSEPEDS: 0 s
Estimated workload: 5.8 METS
Exercise duration (min): 4 min
LHR: 0.24
LVDIAVOL: 104 mL
LVSYSVOL: 50 mL
Peak HR: 148 {beats}/min
Percent HR: 96 %
RPE: 18
Rest HR: 81 {beats}/min
SDS: 0
SRS: 0
SSS: 0
TID: 0.91

## 2015-07-25 MED ORDER — TECHNETIUM TC 99M SESTAMIBI GENERIC - CARDIOLITE
10.3000 | Freq: Once | INTRAVENOUS | Status: AC | PRN
  Administered 2015-07-25: 10 via INTRAVENOUS

## 2015-07-25 MED ORDER — TECHNETIUM TC 99M SESTAMIBI GENERIC - CARDIOLITE
31.0000 | Freq: Once | INTRAVENOUS | Status: AC | PRN
  Administered 2015-07-25: 31 via INTRAVENOUS

## 2015-08-07 ENCOUNTER — Telehealth: Payer: Self-pay | Admitting: Cardiology

## 2015-08-07 NOTE — Telephone Encounter (Signed)
New message     Pt want stress test results and sleep study results.  Please call and let him know if they have been read

## 2015-08-07 NOTE — Telephone Encounter (Signed)
Spoke with pt and informed him of results for stress test. Informed pt that I did not see any information in regards to his sleep study. Advised pt that he would hear from our office as soon as we had any information on his sleep study. Pt verbalized understanding and was in agreement with this plan.

## 2015-08-12 ENCOUNTER — Telehealth: Payer: Self-pay | Admitting: Cardiology

## 2015-08-12 NOTE — Telephone Encounter (Signed)
See phone note from 08/11/15 regarding results

## 2015-08-12 NOTE — Telephone Encounter (Signed)
Please let patient know that he has moderate OSA and set up for CPAP titration in lab

## 2015-08-12 NOTE — Telephone Encounter (Signed)
Patient is aware of results. He said before he made a decision he wanted to sit down and talk to his wife.  I gave him my direct number to call with any questions, and I told him if I had not heard back from him by next week then I would call him again.

## 2015-08-19 ENCOUNTER — Encounter: Payer: Self-pay | Admitting: Cardiology

## 2016-09-11 ENCOUNTER — Other Ambulatory Visit: Payer: Self-pay | Admitting: Family Medicine

## 2016-09-11 DIAGNOSIS — Z85118 Personal history of other malignant neoplasm of bronchus and lung: Secondary | ICD-10-CM

## 2016-09-17 ENCOUNTER — Ambulatory Visit
Admission: RE | Admit: 2016-09-17 | Discharge: 2016-09-17 | Disposition: A | Discharge status: Home / Self Care | Payer: Medicare Other | Source: Ambulatory Visit | Attending: Family Medicine | Admitting: Family Medicine

## 2016-09-17 DIAGNOSIS — Z85118 Personal history of other malignant neoplasm of bronchus and lung: Secondary | ICD-10-CM

## 2016-09-17 MED ORDER — IOPAMIDOL (ISOVUE-300) INJECTION 61%
75.0000 mL | Freq: Once | INTRAVENOUS | Status: AC | PRN
  Administered 2016-09-17: 75 mL via INTRAVENOUS

## 2016-10-04 ENCOUNTER — Encounter: Payer: Self-pay | Admitting: Internal Medicine

## 2016-10-04 ENCOUNTER — Ambulatory Visit (INDEPENDENT_AMBULATORY_CARE_PROVIDER_SITE_OTHER): Payer: Medicare Other | Admitting: Internal Medicine

## 2016-10-04 VITALS — BP 132/84 | HR 95 | Ht 69.0 in | Wt 220.0 lb

## 2016-10-04 DIAGNOSIS — R918 Other nonspecific abnormal finding of lung field: Secondary | ICD-10-CM

## 2016-10-04 NOTE — Patient Instructions (Signed)
We will schedule you for PFT's at [facility] next available slot at your convenience   We will call you to schedule PET scan no later than 6 months from now unless Dr Skipper Cliche wants to move it up

## 2016-10-04 NOTE — Progress Notes (Signed)
Subjective:     Patient ID: [name], male   DOB: 1946/10/15,     MRN: [id number]  HPI  53 yowm quit smoking 2005 s/p LULobectomy with chemo / RT 2005 with chest wall involvement and tolerated well except required stents for L Subclavian artery referred to pulmonary clinic 10/04/2016 by Dr   [name] re: abn CT chest     10/04/2016 1st [facility] office visit/ [name]   Chief Complaint  Patient presents with  . Pulmonary Consult    Referred by Dr. Hulan Everett for eval of abnormal ct chest.   Not limited by breathing from desired activities    No obvious day to day or daytime variability or assoc chronic cough or cp or chest tightness, subjective wheeze or overt sinus or hb symptoms. No unusual exp hx or h/o childhood pna/ asthma or knowledge of premature birth.  Sleeping ok without nocturnal  or early am exacerbation  of respiratory  c/o's or need for noct saba. Also denies any obvious fluctuation of symptoms with weather or environmental changes or other aggravating or alleviating factors except as outlined above   Current Medications, Allergies, Complete Past Medical History, Past Surgical History, Family History, and Social History were reviewed in [employer] record.  ROS  The following are not active complaints unless bolded sore throat, dysphagia, dental problems, itching, sneezing,  nasal congestion or excess/ purulent secretions, ear ache,   fever, chills, sweats, unintended wt loss, classically pleuritic or exertional cp, hemoptysis,  orthopnea pnd or leg swelling, presyncope, palpitations, abdominal pain, anorexia, nausea, vomiting, diarrhea  or change in bowel or bladder habits, change in stools or urine, dysuria,hematuria,  rash, arthralgias, visual complaints, headache, numbness, weakness or ataxia or problems with walking or coordination,  change in mood/affect or memory.      Review of Systems     Objective:   Physical Exam amb wm  nad   Wt Readings from Last 3 Encounters:  10/04/16 220 lb (99.8 kg)  07/25/15 198 lb (89.8 kg)  07/10/15 198 lb 6.4 oz (90 kg)    Vital signs reviewed  HEENT: nl dentition, turbinates, and oropharynx. Nl external ear canals without cough reflex   NECK :  without JVD/Nodes/TM/ nl carotid upstrokes bilaterally   LUNGS: no acc muscle use,  Nl contour chest which is clear to A and P bilaterally without cough on insp or exp maneuvers   CV:  RRR  no s3 or murmur or increase in P2, no edema   ABD:  soft and nontender with nl inspiratory excursion in the supine position. No bruits or organomegaly, bowel sounds nl  MS:  Nl gait/ ext warm without deformities, calf tenderness, cyanosis or clubbing No obvious joint restrictions   SKIN: warm and dry without lesions    NEURO:  alert, approp, nl sensorium with  no motor deficits      I personally reviewed images and agree with radiology impression as follows:  CT  Chest    With contrast 09/17/16  Aortic atherosclerosis.  Coronary artery calcifications are noted suggesting coronary artery disease.  Postsurgical changes related to left upper lobectomy are noted and stable.  3.1 x 2.2 cm ground-glass opacity with central cavitation is seen in superior segment of right lower lobe which appears more prominent compared to prior exam. Adenocarcinoma cannot be excluded     Assessment:

## 2016-10-04 NOTE — Assessment & Plan Note (Addendum)
See Chest CT 09/17/16 > rec pfts then ? PET vs CT f/u   He has no symptoms at all or hx to suggest recent or active  pna (though may be at risk of asp into sup segment due to h/o etohism ) so this could be adenoca but even if so may not show up on PET given how "unsolid" it appears on CT   rec first obtains pfts then consider PET for the purpose of excluding other met dz and if there is any interim growth will need to consider segmentectomy if still a surgical candidate  Discussed in detail all the  indications, usual  risks and alternatives  relative to the benefits with patient who agrees to proceed with conservative f/u as outlined.     Total time devoted to counseling  = 35/40mreview case with pt/ discussion of options/alternatives/ personally creating written instructions  in presence of pt  then going over those specific  Instructions directly with the pt including how to use all of the meds but in particular covering each new medication in detail and the difference between the maintenance/automatic meds and the prns using an action plan format for the latter.

## 2016-10-06 ENCOUNTER — Ambulatory Visit (INDEPENDENT_AMBULATORY_CARE_PROVIDER_SITE_OTHER): Payer: Medicare Other | Admitting: Internal Medicine

## 2016-10-06 DIAGNOSIS — R918 Other nonspecific abnormal finding of lung field: Secondary | ICD-10-CM

## 2016-10-06 LAB — PULMONARY FUNCTION TEST
DL/VA % pred: 87 %
DL/VA: 3.93 ml/min/mmHg/L
DLCO COR: 19.83 ml/min/mmHg
DLCO cor % pred: 65 %
DLCO unc % pred: 67 %
DLCO unc: 20.37 ml/min/mmHg
FEF 25-75 Post: 0.92 L/sec
FEF 25-75 Pre: 0.65 L/sec
FEF2575-%CHANGE-POST: 41 %
FEF2575-%Pred-Post: 38 %
FEF2575-%Pred-Pre: 27 %
FEV1-%Change-Post: 13 %
FEV1-%PRED-PRE: 47 %
FEV1-%Pred-Post: 53 %
FEV1-PRE: 1.47 L
FEV1-Post: 1.67 L
FEV1FVC-%CHANGE-POST: 6 %
FEV1FVC-%Pred-Pre: 77 %
FEV6-%Change-Post: 6 %
FEV6-%PRED-PRE: 63 %
FEV6-%Pred-Post: 67 %
FEV6-POST: 2.68 L
FEV6-PRE: 2.51 L
FEV6FVC-%Change-Post: 0 %
FEV6FVC-%PRED-POST: 104 %
FEV6FVC-%PRED-PRE: 103 %
FVC-%CHANGE-POST: 6 %
FVC-%PRED-POST: 64 %
FVC-%Pred-Pre: 60 %
FVC-POST: 2.74 L
FVC-PRE: 2.57 L
POST FEV6/FVC RATIO: 98 %
Post FEV1/FVC ratio: 61 %
Pre FEV1/FVC ratio: 57 %
Pre FEV6/FVC Ratio: 98 %
RV % PRED: 142 %
RV: 3.33 L
TLC % PRED: 86 %
TLC: 5.84 L

## 2016-10-07 ENCOUNTER — Encounter: Payer: Self-pay | Admitting: Internal Medicine

## 2016-10-07 DIAGNOSIS — J449 Chronic obstructive pulmonary disease, unspecified: Secondary | ICD-10-CM | POA: Insufficient documentation

## 2016-10-07 NOTE — Progress Notes (Signed)
lmtcb

## 2016-10-08 ENCOUNTER — Telehealth: Payer: Self-pay | Admitting: Internal Medicine

## 2016-10-08 NOTE — Telephone Encounter (Signed)
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

## 2016-10-20 ENCOUNTER — Telehealth: Payer: Self-pay | Admitting: Internal Medicine

## 2016-10-20 NOTE — Telephone Encounter (Signed)
Pt states that someone from our office called about scheduling a pet.  It does not look like we have ordered a pet scan. According to 10-08-16 phone note it looks like Dr. Melvyn Novas has been in contact with Dr. Roxan Hockey and a ct scan is scheduled for 12-17-16. I have advise pt to call Dr. Leonarda Salon office about this as it looks like they are the ones scheduling. Pt voiced understanding and states he will give [place] a call back with any further questions or concerns. Nothing further needed.

## 2016-10-21 ENCOUNTER — Telehealth: Payer: Self-pay | Admitting: Internal Medicine

## 2016-10-21 NOTE — Telephone Encounter (Signed)
Called and spoke with pt and he stated that he was confused about having the PET scan.  I advised him that MW wanted him to have this done the end of the year.  Pt stated that he is ok to have the PET done December  28 or 29 or the first week of January.  Will forward back to MW and Magda Paganini to make them aware.

## 2016-10-22 NOTE — Telephone Encounter (Signed)
This was also Dr [name]'s rec but the exac date is not an issue so no change in recs

## 2016-11-16 ENCOUNTER — Other Ambulatory Visit: Payer: Self-pay | Admitting: Internal Medicine

## 2016-11-16 DIAGNOSIS — R918 Other nonspecific abnormal finding of lung field: Secondary | ICD-10-CM

## 2016-12-17 ENCOUNTER — Ambulatory Visit (HOSPITAL_COMMUNITY)
Admission: RE | Admit: 2016-12-17 | Discharge: 2016-12-17 | Disposition: A | Discharge status: Home / Self Care | Payer: Medicare Other | Source: Ambulatory Visit | Attending: Internal Medicine | Admitting: Internal Medicine

## 2016-12-17 DIAGNOSIS — R918 Other nonspecific abnormal finding of lung field: Secondary | ICD-10-CM | POA: Diagnosis present

## 2016-12-17 DIAGNOSIS — R911 Solitary pulmonary nodule: Secondary | ICD-10-CM | POA: Insufficient documentation

## 2016-12-17 DIAGNOSIS — K760 Fatty (change of) liver, not elsewhere classified: Secondary | ICD-10-CM | POA: Diagnosis not present

## 2016-12-17 DIAGNOSIS — I700 Atherosclerosis of aorta: Secondary | ICD-10-CM | POA: Diagnosis not present

## 2016-12-17 DIAGNOSIS — I2510 Atherosclerotic heart disease of native coronary artery without angina pectoris: Secondary | ICD-10-CM | POA: Diagnosis not present

## 2016-12-17 DIAGNOSIS — J432 Centrilobular emphysema: Secondary | ICD-10-CM | POA: Diagnosis not present

## 2016-12-17 LAB — GLUCOSE, CAPILLARY: GLUCOSE-CAPILLARY: 98 mg/dL (ref 65–99)

## 2016-12-17 MED ORDER — FLUDEOXYGLUCOSE F - 18 (FDG) INJECTION
10.9800 | Freq: Once | INTRAVENOUS | Status: AC | PRN
  Administered 2016-12-17: 10.98 via INTRAVENOUS

## 2016-12-21 ENCOUNTER — Other Ambulatory Visit: Payer: Self-pay

## 2016-12-21 ENCOUNTER — Telehealth: Payer: Self-pay | Admitting: Internal Medicine

## 2016-12-21 DIAGNOSIS — R918 Other nonspecific abnormal finding of lung field: Secondary | ICD-10-CM

## 2016-12-21 NOTE — Telephone Encounter (Signed)
Returned call to patient. See PET scan results for more info.

## 2016-12-28 ENCOUNTER — Other Ambulatory Visit: Payer: Self-pay | Admitting: *Deleted

## 2016-12-28 ENCOUNTER — Encounter: Payer: Self-pay | Admitting: Thoracic Surgery (Cardiothoracic Vascular Surgery)

## 2016-12-28 ENCOUNTER — Ambulatory Visit (INDEPENDENT_AMBULATORY_CARE_PROVIDER_SITE_OTHER): Payer: Medicare Other | Admitting: Thoracic Surgery (Cardiothoracic Vascular Surgery)

## 2016-12-28 VITALS — BP 137/79 | HR 128 | Resp 20 | Ht 69.0 in | Wt 223.0 lb

## 2016-12-28 DIAGNOSIS — C3411 Malignant neoplasm of upper lobe, right bronchus or lung: Secondary | ICD-10-CM

## 2016-12-28 DIAGNOSIS — R911 Solitary pulmonary nodule: Secondary | ICD-10-CM

## 2016-12-28 DIAGNOSIS — R918 Other nonspecific abnormal finding of lung field: Secondary | ICD-10-CM

## 2016-12-28 NOTE — Progress Notes (Signed)
PCP is Gennette Pac, MD Referring Provider is Tanda Rockers, MD  Chief Complaint  Patient presents with  . Lung Lesion    PET Scan 12/17/16, Chest CT 09/17/16, PFT's 10/06/16, HX of LULobectomy 2005    HPI: [name] is sent for consultation regarding a right lower lobe lung nodule.  [name] is a 71 year old gentleman with a history of tobacco abuse (quit in 2005), lung cancer, peripheral neuropathy, hypertension, hyperlipidemia, ischemic colitis, left subclavian vein stent and thrombosis, polio, and glaucoma. 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 He had adjuvant chemotherapy and radiation. He completed all treatments of November 2005. I followed him for 5 years with no evidence of recurrent disease.  He has been followed recently with low-dose CT scans on an annual basis. In September he had a CT which was abnormal showing a 3 cm groundglass opacity in the superior segment of the right lower lobe with an adjacent cystic area. He saw Dr. Melvyn Novas. He was concerned this could be a new lung cancer. We discussed the films and decided to get an interval follow-up at 3 months but to do a PET/CT to see if there was any metabolic activity associated with the lesion. He had a PET/CT which showed the lesion was still present. There was not significant metabolic activity associated with it.  Past Medical History:  Diagnosis Date  . Cancer ([place])    lung cancer  . Glaucoma   . Hereditary and idiopathic peripheral neuropathy 09/26/2014  . Hyperlipidemia   . Hypertension   . Ischemic colitis ([place])   . 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 Polio     Past Surgical History:  Procedure Laterality Date  . HERNIA REPAIR     left ingunal  . LUNG REMOVAL, PARTIAL    . TONSILLECTOMY      Family History  Problem Relation Age of Onset  . Cancer Mother     intestine  . Hyperlipidemia Mother   . Heart Problems Brother   . Heart attack Brother 55     s/p CABG and redo 5 years later  . Heart attack Father   . Heart disease Father   . Colon cancer Maternal Grandmother   . Colon cancer Maternal Aunt     Social History Social History  Substance Use Topics  . Smoking status: Former Smoker    Packs/day: 2.00    Years: 40.00    Types: Cigarettes  . Smokeless tobacco: Never Used  . Alcohol use Yes     Comment: occasionally- Vodka 4 to five drinks a night    Current Outpatient Prescriptions  Medication Sig Dispense Refill  . atorvastatin (LIPITOR) 10 MG tablet Take 10 mg by mouth daily.    . carvedilol (COREG) 3.125 MG tablet Take 3.125 mg by mouth 2 (two) times daily with a meal.    . ferrous sulfate 325 (65 FE) MG tablet Take 325 mg by mouth daily with breakfast.    . ipratropium (ATROVENT HFA) 17 MCG/ACT inhaler Inhale 2 puffs into the lungs every 6 (six) hours. For wheezing    . latanoprost (XALATAN) 0.005 % ophthalmic solution Place 1 drop into both eyes at bedtime.    Marland Kitchen losartan (COZAAR) 50 MG tablet Take 50 mg by mouth daily.    . Multiple Vitamin (MULTIVITAMIN) capsule Take 1 capsule by mouth daily.    . Naphazoline-Glycerin (CLEAR EYES REDNESS RELIEF OP) Apply 2 drops to eye daily.    Marland Kitchen  warfarin (COUMADIN) 5 MG tablet Take 1 tablet (5 mg total) by mouth as directed. (Patient taking differently: Take 5-15 mg by mouth as directed. ) 125 tablet 4   No current facility-administered medications for this visit.     No Known Allergies  Review of Systems  Constitutional: Negative for appetite change, chills, fever and unexpected weight change.  HENT: Negative for trouble swallowing and voice change.   Respiratory: Negative for cough, chest tightness and wheezing.   Gastrointestinal: Negative for abdominal pain and blood in stool.  Genitourinary: Positive for frequency. Negative for difficulty urinating.  Neurological: Negative for seizures, syncope and headaches.  Hematological: Negative for adenopathy. Does not bruise/bleed  easily.    BP 137/79   Pulse (!) 128   Resp 20   Ht '5\' 9"'$  (1.753 m)   Wt 223 lb (101.2 kg)   BMI 32.93 kg/m  Physical Exam  Constitutional: He is oriented to person, place, and time. He appears well-developed and well-nourished. No distress.  HENT:  Head: Normocephalic and atraumatic.  Mouth/Throat: No oropharyngeal exudate.  Eyes: Conjunctivae and EOM are normal. No scleral icterus.  Cardiovascular: Regular rhythm.  Exam reveals no gallop.   No murmur heard. Tachycardic at 108  Pulmonary/Chest: No respiratory distress. He has no wheezes. He has no rales.  Diminished breath sounds at left base  Abdominal: Soft. He exhibits no distension. There is no tenderness.  Musculoskeletal: He exhibits no edema.  Neurological: He is alert and oriented to person, place, and time. No cranial nerve deficit.  Skin: Skin is warm and dry.  Vitals reviewed.    Diagnostic Tests: CT CHEST WITH CONTRAST  TECHNIQUE: Multidetector CT imaging of the chest was performed during intravenous contrast administration.  CONTRAST:  2m ISOVUE-300 IOPAMIDOL (ISOVUE-300) INJECTION 61%  COMPARISON:  CT scan of December 02, 2014 and April 05, 2012.  FINDINGS: Cardiovascular: Atherosclerosis of thoracic aorta is noted without aneurysm or dissection. Mild coronary artery calcifications are noted.  Mediastinum/Nodes: No significant mediastinal mass or adenopathy is noted.  Lungs/Pleura: Stable left apical thickening is noted consistent with history of left upper lobectomy. No pneumothorax or pleural effusion is noted. Stable 4 mm nodule is noted in right middle lobe best seen on image number 85 of series 5. Ground-glass opacity with central cavitation is seen in superior segment of right lower lobe which measures 3.1 x 2.2 cm. This appears to have progressed since prior exam. It appears to extend to the major fissure as well.  Upper Abdomen: Fatty infiltration of the liver is noted. No  other significant abnormality seen in the visualized portion the abdomen.  Musculoskeletal: Old left upper rib fractures are noted consistent were postoperative change.  IMPRESSION: Aortic atherosclerosis.  Coronary artery calcifications are noted suggesting coronary artery disease.  Postsurgical changes related to left upper lobectomy are noted and stable.  3.1 x 2.2 cm ground-glass opacity with central cavitation is seen in superior segment of right lower lobe which appears more prominent compared to prior exam. Adenocarcinoma cannot be excluded. Follow up by CT is recommended in 12 months, with continued annual surveillance for a minimum of 3 years.These recommendations are taken from:Recommendations for the Management of Subsolid Pulmonary Nodules Detected at CT: A Statement from the [facility] 2013; 266:1, [id number]   Electronically Signed   By: JMarijo Conception M.D.   On: 09/17/2016 14:40 [facility] PET SKULL BASE TO THIGH  TECHNIQUE: 11.0 mCi F-18 FDG was injected intravenously. Full-ring PET imaging was performed from  the skull base to thigh after the radiotracer. CT data was obtained and used for attenuation correction and anatomic localization.  FASTING BLOOD GLUCOSE:  Value: 98 mg/dl  COMPARISON:  09/17/2016 chest CT.  06/13/2008 PET-CT.  FINDINGS: NECK  No hypermetabolic lymph nodes in the neck.  CHEST  Left anterior descending, left circumflex and right coronary atherosclerosis. Atherosclerotic nonaneurysmal thoracic aorta. Left subclavian vein stent is in place. No hypermetabolic axillary, mediastinal or hilar lymph nodes. No pleural effusions. No pneumothorax. Status post left upper lobectomy. Mild-to-moderate centrilobular emphysema.  There is a 3.0 x 2.4 cm ground-glass pulmonary nodule with central cystic change in the superior segment right lower lobe (series 8/image 29) without significant metabolism (max  SUV 1.3), not appreciably change from 2.9 x 2.3 cm on 09/17/2016 chest CT using similar measurement technique, increased from 1.5 x 1.1 cm on 12/02/2014 chest CT.  Right middle lobe 4 mm solid pulmonary nodule (series 8/image 42) is below PET resolution and stable back to 12/02/2014 and considered benign. No acute consolidative airspace disease or additional significant pulmonary nodules.  ABDOMEN/PELVIS  No abnormal hypermetabolic activity within the liver, pancreas, adrenal glands, or spleen. No hypermetabolic lymph nodes in the abdomen or pelvis. Diffuse hepatic steatosis. Atherosclerotic nonaneurysmal abdominal aorta. Stable hernia repair mesh in the ventral left pelvis. Nonspecific stable chronic mild haziness of the central mesenteric fat without hypermetabolic adenopathy, mass or fluid collection, most suggestive of chronic mesenteric panniculitis.  SKELETON  No focal hypermetabolic activity to suggest skeletal metastasis. Chronic healed deformity in the left posterior sixth rib is unchanged back to 12/02/2014, probably posttraumatic.  IMPRESSION: 1. Non-hypermetabolic ground-glass 3.0 cm pulmonary nodule with central cystic change in the superior segment right lower lobe. This nodule remains suspicious for indolent primary bronchogenic adenocarcinoma given growth since 10/02/2014 chest CT. Multidisciplinary thoracic oncology review advised. Continued chest CT surveillance in 6-12 months advised at a minimum. 2. No hypermetabolic metastatic disease. 3. 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   Electronically Signed   By: Ilona Sorrel M.D.   On: 12/17/2016 12:09  Pulmonary function testing FVC 2.57 (60%) FEV1 1.47 (47%) FEV1  1.67 (53%) post bronchodilator DLCO 19.83 (65%)  I personally  reviewed the CT and PET CT images and concur with the findings noted above.  Impression: [name] is a 71 year old gentleman who is a former smoker. He has a history of a T3 N0 adenocarcinoma treated with surgical resection followed by chemotherapy and radiation due to proximity to the subclavian vessels. 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 He does remain on Coumadin for that. He now is over 10 years out with no evidence of recurrent disease.  He now has a new groundglass opacity in the superior segment of the right lower lobe. 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 There are a couple of other groundglass opacities that are smaller in the right upper lobe adjacent to the superior segment.   I discussed 3 potential options with [name]. One would be continued radiographic observation. That would not be unreasonable given the PET findings. The second would be to attempt a biopsy. This could be done either CT-guided or bronchoscopic. Both would be subject to a fairly high false negative rate. The third and final option would be to do a right VATS for a right lower lobe superior segmentectomy to  resect the nodule for definitive diagnosis and treatment. We would also try to localize and wedge out the right upper lobe nodules the same time. I think any of those options are reasonable. Although I this is highly likely to be a low-grade adenocarcinoma and would favor surgical resection. We discussed the relative advantages and disadvantages of each of those approaches.  I have discussed with the general nature of Right VATS for segementectomy, including the need for general anesthesia, and the incisions to be used with [name]. We discussed the expected hospital stay, overall recovery and short and long term outcomes. I informed him of the indications, risks, benefits, and alternatives  (radiographic observation versus biopsy). They understand the risks include, but are not limited to death, stroke, MI, DVT/PE, bleeding, possible need for transfusion, infections, cardiac arrhythmias, prolonged air leaks, as well as other organ system dysfunction including respiratory, renal, or GI complications.  After discussing the options he wishes to be aggressive and have the lesion removed surgically.   Plan: Right VATS for superior segmentectomy and possible wedge resection of right upper lobe  Melrose Nakayama, MD [facility] [phone]

## 2016-12-31 NOTE — Pre-Procedure Instructions (Signed)
REI MEDLEN  12/31/2016      [facility] - [place], [place] [address] [place] [address] Phone: [phone] Fax: [phone]  [facility] [address] [place], [place] - [place] [employer] [place] [address] Phone: [phone] Fax: [phone]    Your procedure is scheduled on Wednesday January 16.  Report to [facility] Admitting at 9:00 A.M.  Call this number if you have problems the morning of surgery:  [phone]   Remember:  Do not eat food or drink liquids after midnight.  Take these medicines the morning of surgery with A SIP OF WATER: carvedilol (coreg), atrovent inhaler  7 days prior to surgery STOP taking any Aspirin, Aleve, Naproxen, Ibuprofen, Motrin, Advil, Goody's, BC's, all herbal medications, fish oil, and all vitamins    Do not wear jewelry, make-up or nail polish.  Do not wear lotions, powders, or perfumes, or deoderant.  Do not shave 48 hours prior to surgery.  Men may shave face and neck.  Do not bring valuables to the hospital.  [facility] is not responsible for any belongings or valuables.  Contacts, dentures or bridgework may not be worn into surgery.  Leave your suitcase in the car.  After surgery it may be brought to your room.  For patients admitted to the hospital, discharge time will be determined by your treatment team.  Patients discharged the day of surgery will not be allowed to drive home.   Special instructions:    [facility]- Preparing For Surgery  Before surgery, you can play an important role. Because skin is not sterile, your skin needs to be as free of germs as possible. You can reduce the number of germs on your skin by washing with CHG (chlorahexidine gluconate) Soap before surgery.  CHG is an antiseptic cleaner which kills germs and bonds with the skin to continue killing germs even after washing.  Please do not  use if you have an allergy to CHG or antibacterial soaps. If your skin becomes reddened/irritated stop using the CHG.  Do not shave (including legs and underarms) for at least 48 hours prior to first CHG shower. It is OK to shave your face.  Please follow these instructions carefully.   1. Shower the NIGHT BEFORE SURGERY and the MORNING OF SURGERY with CHG.   2. If you chose to wash your hair, wash your hair first as usual with your normal shampoo.  3. After you shampoo, rinse your hair and body thoroughly to remove the shampoo.  4. Use CHG as you would any other liquid soap. You can apply CHG directly to the skin and wash gently with a scrungie or a clean washcloth.   5. Apply the CHG Soap to your body ONLY FROM THE NECK DOWN.  Do not use on open wounds or open sores. Avoid contact with your eyes, ears, mouth and genitals (private parts). Wash genitals (private parts) with your normal soap.  6. Wash thoroughly, paying special attention to the area where your surgery will be performed.  7. Thoroughly rinse your body with warm water from the neck down.  8. DO NOT shower/wash with your normal soap after using and rinsing off the CHG Soap.  9. Pat yourself dry with a CLEAN TOWEL.   10. Wear CLEAN PAJAMAS   11. Place CLEAN SHEETS on your bed the night of your first shower and DO NOT SLEEP  WITH PETS.    Day of Surgery: Do not apply any deodorants/lotions. Please wear clean clothes to the hospital/surgery center.      Please read over the following fact sheets that you were given. Coughing and Deep Breathing and MRSA Information

## 2017-01-01 ENCOUNTER — Ambulatory Visit (HOSPITAL_COMMUNITY)
Admission: EM | Admit: 2017-01-01 | Discharge: 2017-01-01 | Disposition: A | Discharge status: Home / Self Care | Payer: Medicare Other | Attending: Internal Medicine | Admitting: Internal Medicine

## 2017-01-01 ENCOUNTER — Encounter (HOSPITAL_COMMUNITY): Payer: Self-pay | Admitting: Emergency Medicine

## 2017-01-01 DIAGNOSIS — L03116 Cellulitis of left lower limb: Secondary | ICD-10-CM | POA: Diagnosis not present

## 2017-01-01 NOTE — ED Provider Notes (Signed)
CSN: [id number]     Arrival date & time 01/01/17  1643 History   First MD Initiated Contact with Patient 01/01/17 1809     Chief Complaint  Patient presents with  . Foot Pain   (Consider location/radiation/quality/duration/timing/severity/associated sxs/prior Treatment) Patient is a well-appearing 71 year old male, believed that his left foot is infected. Patient believes that he over filed his callous on his left heel. Then later went to get a pedicure, and the callus got filed more during the pedicure. This occurred 1 week ago, and now he believes the skin is not infected. Patient denies fever or pain.       Past Medical History:  Diagnosis Date  . Cancer ([place])    lung cancer  . Glaucoma   . Hereditary and idiopathic peripheral neuropathy 09/26/2014  . Hyperlipidemia   . Hypertension   . Ischemic colitis ([place])   . Left subclavian vein thrombosis ([place])   . Polio    Past Surgical History:  Procedure Laterality Date  . HERNIA REPAIR     left ingunal  . LUNG REMOVAL, PARTIAL    . TONSILLECTOMY     Family History  Problem Relation Age of Onset  . Cancer Mother     intestine  . Hyperlipidemia Mother   . Heart Problems Brother   . Heart attack Brother 56    s/p CABG and redo 5 years later  . Heart attack Father   . Heart disease Father   . Colon cancer Maternal Grandmother   . Colon cancer Maternal Aunt    Social History  Substance Use Topics  . Smoking status: Former Smoker    Packs/day: 2.00    Years: 40.00    Types: Cigarettes  . Smokeless tobacco: Never Used  . Alcohol use Yes     Comment: occasionally- Vodka 4 to five drinks a night    Review of Systems  Respiratory: Negative.   Cardiovascular: Negative.   Skin:       See HPI  All other systems reviewed and are negative.   Allergies  Patient has no known allergies.  Home Medications   Prior to Admission medications   Medication Sig Start Date End Date Taking? Authorizing Provider  atorvastatin  (LIPITOR) 10 MG tablet Take 10 mg by mouth every evening.  09/23/14  Yes Historical Provider, MD  carvedilol (COREG) 3.125 MG tablet Take 3.125 mg by mouth 2 (two) times daily with a meal.   Yes Historical Provider, MD  ferrous sulfate 325 (65 FE) MG tablet Take 325 mg by mouth every evening.    Yes Historical Provider, MD  latanoprost (XALATAN) 0.005 % ophthalmic solution Place 1 drop into both eyes at bedtime.   Yes Historical Provider, MD  losartan (COZAAR) 50 MG tablet Take 50 mg by mouth daily.   Yes Historical Provider, MD  Multiple Vitamin (MULTIVITAMIN WITH MINERALS) TABS tablet Take 1 tablet by mouth daily.   Yes Historical Provider, MD  warfarin (COUMADIN) 5 MG tablet Take 1 tablet (5 mg total) by mouth as directed. Patient taking differently: Take 5-7.5 mg by mouth See admin instructions. Monday, Wednesday, and Friday 7.5 mg (1.5 tablets) & 5 mg (1 tablet) all other days. 11/22/11  Yes Eston Esters, MD   Meds Ordered and Administered this Visit  Medications - No data to display  BP 124/73 (BP Location: Left Arm)   Pulse 91   Temp 98.5 F (36.9 C) (Oral)   Resp 20   SpO2 97%  No data  found.   Physical Exam  Constitutional: He appears well-developed and well-nourished.  Cardiovascular: Normal rate, regular rhythm and normal heart sounds.   Pulmonary/Chest: Effort normal and breath sounds normal.  Skin:  See picture below.   Nursing note and vitals reviewed.       Urgent Care Course   Clinical Course     Procedures (including critical care time)  Labs Review Labs Reviewed - No data to display  Imaging Review No results found.   MDM   1. Cellulitis of left lower extremity    Start Keflex 500 mg 3 times a day 7 days.  Start Bactroban 2% ointment twice a day 7 days.  *Both antibiotic called in to patient's pharmacy. Wound care discussed. If wound does not improve despite antibiotics, please follow up with your primary care doctor.      Barry Dienes,  NP 01/01/17 [phone]

## 2017-01-01 NOTE — ED Triage Notes (Signed)
The patient presented to the [facility] with a complaint of left foot pain secondary to "filing" a callas off last week. The patient reported pain and stinging and swelling.

## 2017-01-03 ENCOUNTER — Other Ambulatory Visit: Payer: Self-pay | Admitting: *Deleted

## 2017-01-03 ENCOUNTER — Encounter (HOSPITAL_COMMUNITY)
Admission: RE | Admit: 2017-01-03 | Discharge: 2017-01-03 | Disposition: A | Discharge status: Home / Self Care | Payer: Medicare Other | Source: Ambulatory Visit | Attending: Thoracic Surgery (Cardiothoracic Vascular Surgery) | Admitting: Thoracic Surgery (Cardiothoracic Vascular Surgery)

## 2017-01-03 ENCOUNTER — Encounter (HOSPITAL_COMMUNITY): Payer: Self-pay

## 2017-01-03 ENCOUNTER — Other Ambulatory Visit: Payer: Self-pay

## 2017-01-03 DIAGNOSIS — Z01810 Encounter for preprocedural cardiovascular examination: Secondary | ICD-10-CM | POA: Insufficient documentation

## 2017-01-03 DIAGNOSIS — Z01812 Encounter for preprocedural laboratory examination: Secondary | ICD-10-CM | POA: Insufficient documentation

## 2017-01-03 DIAGNOSIS — C3411 Malignant neoplasm of upper lobe, right bronchus or lung: Secondary | ICD-10-CM

## 2017-01-03 HISTORY — DX: Chronic obstructive pulmonary disease, unspecified: J44.9

## 2017-01-03 HISTORY — DX: Sleep apnea, unspecified: G47.30

## 2017-01-03 LAB — CBC
HCT: 46.4 % (ref 39.0–52.0)
Hemoglobin: 15.9 g/dL (ref 13.0–17.0)
MCH: 32.7 pg (ref 26.0–34.0)
MCHC: 34.3 g/dL (ref 30.0–36.0)
MCV: 95.5 fL (ref 78.0–100.0)
PLATELETS: 234 10*3/uL (ref 150–400)
RBC: 4.86 MIL/uL (ref 4.22–5.81)
RDW: 13.6 % (ref 11.5–15.5)
WBC: 7.5 10*3/uL (ref 4.0–10.5)

## 2017-01-03 LAB — COMPREHENSIVE METABOLIC PANEL
ALBUMIN: 3.6 g/dL (ref 3.5–5.0)
ALK PHOS: 101 U/L (ref 38–126)
ALT: 31 U/L (ref 17–63)
AST: 29 U/L (ref 15–41)
Anion gap: 11 (ref 5–15)
BUN: 6 mg/dL (ref 6–20)
CALCIUM: 9.4 mg/dL (ref 8.9–10.3)
CHLORIDE: 102 mmol/L (ref 101–111)
CO2: 25 mmol/L (ref 22–32)
CREATININE: 0.76 mg/dL (ref 0.61–1.24)
GFR calc Af Amer: >60 mL/min (ref >60)
GFR calc non Af Amer: >60 mL/min (ref >60)
GLUCOSE: 91 mg/dL (ref 65–99)
Potassium: 4.2 mmol/L (ref 3.5–5.1)
SODIUM: 138 mmol/L (ref 135–145)
Total Bilirubin: 0.8 mg/dL (ref 0.3–1.2)
Total Protein: 7 g/dL (ref 6.5–8.1)

## 2017-01-03 LAB — URINALYSIS, ROUTINE W REFLEX MICROSCOPIC
BILIRUBIN URINE: NEGATIVE
Glucose, UA: NEGATIVE mg/dL
Hgb urine dipstick: NEGATIVE
Ketones, ur: NEGATIVE mg/dL
LEUKOCYTES UA: NEGATIVE
NITRITE: NEGATIVE
PH: 6 (ref 5.0–8.0)
Protein, ur: NEGATIVE mg/dL
SPECIFIC GRAVITY, URINE: 1.005 (ref 1.005–1.030)

## 2017-01-03 LAB — ABO/RH: ABO/RH(D): O POS

## 2017-01-03 LAB — SURGICAL PCR SCREEN
MRSA, PCR: NEGATIVE
STAPHYLOCOCCUS AUREUS: NEGATIVE

## 2017-01-03 NOTE — Progress Notes (Addendum)
PCP: Dr. Rex Kras Cardiologist: Dr. Radford Pax ECHO: 03/21/15 Stress test: 07/25/15 EKG: 01/03/17 CXR to be done D.O.S. (within 72 hr) Pt on coumadin, has been on hold since 12/30/16  Pt with no complaints of chest pain or SOB.  Pt with swelling to left ankle, two open areas to outer side or left foot, pt with prescription for keflex and mupirocin for areas. Ryan at Dr. Leonarda Salon office notified. Per Dr. Roxan Hockey, pt to be cancelled and rescheduled for next week on the 24th. Ryan to call patient with arrival time and instructions on starting coumadin back prior to surgery.    Per ryan, ok to draw labs today for surgery next week on Wednesday. Will need to draw PT-INR DOS instead of today d/t pt starting coumadin back.   Blood gas drawn in PAT was a venous sample per lab, will need to draw D.O.S. Order placed.

## 2017-01-04 NOTE — Progress Notes (Signed)
Anesthesia Chart Review: Patient is a 71 year old male scheduled for right VATS, right superior segmentectomy on 01/12/17 by Dr. Roxan Hockey. Procedure was initially scheduled for 01/06/17, but was postponed due to treatment for left foot wound with cellulitis (stared on Keflex in ED on 01/01/17).    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 BMI is consistent with obesity.  PCP is Dr. Hulan Fess. Pulmonologist is Dr. Christinia Gully. He saw cardiologist Dr. Fransico Him for OSA evaluation, diastolic dysfunction on echo, and family history of CAD on 07/10/15. He had a low risk stress test and was diagnosed with moderate OSA (07/2015 sleep study).   Meds include Lipitor, Coreg, Keflex, 65 Fe, Xalatan ophthalmic, losartan, MVI, warfarin (was on hold 12/30/16, TCTS RN Ryan to give patient new instructions since surgery moved to 01/12/17).  BP 139/82   Pulse 89   Temp 36.6 C (Oral)   Resp 20   Ht '5\' 9"'$  (1.753 m)   Wt 221 lb 5.5 oz (100.4 kg)   SpO2 98%   BMI 32.69 kg/m  He denied chest pain and SOB at PAT.  EKG 01/03/17: NSR.   Nuclear stress test 07/25/15:  Nuclear stress EF: 52%.  Blood pressure demonstrated a hypertensive response to exercise.  There was no ST segment deviation noted during stress.  This is a low risk study.  The left ventricular ejection fraction is mildly decreased (45-54%).  There was a small, mild basal to mid inferior perfusion defect that was fixed.  Given normal wall motion, this may be due to diaphragmatic attenuation.  No ischemia.  EF 52% (mildly decreased) but wall motion appeared normal.   Echo 03/21/15: Study  Conclusions - Left ventricle: The cavity size was normal. Wall thickness was normal. Systolic function was normal. The estimated ejection fraction was in the range of 50% to 55%. Wall motion was normal; there were no regional wall motion abnormalities. Doppler parameters are consistent with abnormal left ventricular relaxation (grade 1 diastolic dysfunction).  PET Scan 12/17/16: IMPRESSION: 1. Non-hypermetabolic ground-glass 3.0 cm pulmonary nodule with central cystic change in the superior segment right lower lobe. This nodule remains suspicious for indolent primary bronchogenic adenocarcinoma given growth since 10/02/2014 chest CT. Multidisciplinary thoracic oncology review advised. Continued chest CT surveillance in 6-12 months advised at a minimum. 2. No hypermetabolic metastatic disease. 3. 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  Chest CT w/ contrast 09/17/16: IMPRESSION: - Aortic atherosclerosis. - Coronary artery calcifications are noted suggesting coronary artery disease. - Postsurgical changes related to left upper lobectomy are noted and stable. - 3.1 x 2.2 cm ground-glass opacity with central cavitation is seen in superior segment of right lower lobe which appears more prominent compared to prior exam. Adenocarcinoma cannot be excluded. Follow up by CT is recommended in 12 months, with continued annual surveillance for a minimum of 3 years.These recommendations are taken from:Recommendations for the Management of Subsolid Pulmonary Nodules Detected at CT: A Statement from the [facility] 2013; 266:1, [phone].  PFTs 10/06/16:  FVC     2.57 (60%) FEV1   1.47 (47%) FEV1   1.67 (53%) post bronchodilator DLCO  19.83 (65%)  He is for CXR on the day of surgery.  Labs from 01/03/17  noted. CBC and CMET WNL. T&S done. He is for PT/INR on the day of surgery.   Patient with RLL lung nodule and prior history of lung cancer. 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 Surgery recommended for diagnosis and treatment. He had a non-ischemic stress test ~ 1 1/2 years ago. EKG WNL. No CV symptoms reported at PAT. He is being treated for left foot cellulitis, so Dr. Roxan Hockey has delayed surgery by a week. He will be further evaluated by his surgeon and anesthesiologist on the day of surgery. If cellulitis improved and remains asymptomatic from a CV standpoint then I would anticipate that he could proceed as planned.   George Hugh Gundersen [facility]/Anesthesiology Phone [phone] 01/04/2017 1:51 PM

## 2017-01-06 LAB — BLOOD GAS, ARTERIAL

## 2017-01-08 ENCOUNTER — Telehealth (HOSPITAL_COMMUNITY): Payer: Self-pay | Admitting: Nurse Practitioner

## 2017-01-08 NOTE — Telephone Encounter (Signed)
Called patient to follow up on his cellulitis. Patient reports that he saw his PCP yesterday and his PCP extended his Keflex for another 3 days. Patient states the wound is looking "90%" Better. Informed to continue to f/u with PCP and consider wound clinic if wound does not healed.

## 2017-01-12 ENCOUNTER — Inpatient Hospital Stay (HOSPITAL_COMMUNITY): Payer: Medicare Other | Admitting: Certified Registered Nurse Anesthetist

## 2017-01-12 ENCOUNTER — Inpatient Hospital Stay (HOSPITAL_COMMUNITY)
Admission: RE | Admit: 2017-01-12 | Discharge: 2017-01-15 | DRG: 165 | Disposition: A | Discharge status: Home / Self Care | Payer: Medicare Other | Source: Ambulatory Visit | Attending: Thoracic Surgery (Cardiothoracic Vascular Surgery) | Admitting: Thoracic Surgery (Cardiothoracic Vascular Surgery)

## 2017-01-12 ENCOUNTER — Encounter (HOSPITAL_COMMUNITY)
Admission: RE | Disposition: A | Discharge status: Home / Self Care | Payer: Self-pay | Source: Ambulatory Visit | Attending: Thoracic Surgery (Cardiothoracic Vascular Surgery)

## 2017-01-12 ENCOUNTER — Inpatient Hospital Stay (HOSPITAL_COMMUNITY): Payer: Medicare Other

## 2017-01-12 ENCOUNTER — Inpatient Hospital Stay (HOSPITAL_COMMUNITY): Payer: Medicare Other | Admitting: Vascular Surgery

## 2017-01-12 ENCOUNTER — Encounter (HOSPITAL_COMMUNITY): Payer: Self-pay | Admitting: Certified Registered Nurse Anesthetist

## 2017-01-12 DIAGNOSIS — Z9221 Personal history of antineoplastic chemotherapy: Secondary | ICD-10-CM

## 2017-01-12 DIAGNOSIS — Z8249 Family history of ischemic heart disease and other diseases of the circulatory system: Secondary | ICD-10-CM

## 2017-01-12 DIAGNOSIS — Z808 Family history of malignant neoplasm of other organs or systems: Secondary | ICD-10-CM | POA: Diagnosis not present

## 2017-01-12 DIAGNOSIS — Z902 Acquired absence of lung [part of]: Secondary | ICD-10-CM

## 2017-01-12 DIAGNOSIS — Z8349 Family history of other endocrine, nutritional and metabolic diseases: Secondary | ICD-10-CM | POA: Diagnosis not present

## 2017-01-12 DIAGNOSIS — H409 Unspecified glaucoma: Secondary | ICD-10-CM | POA: Diagnosis present

## 2017-01-12 DIAGNOSIS — Z87891 Personal history of nicotine dependence: Secondary | ICD-10-CM

## 2017-01-12 DIAGNOSIS — G609 Hereditary and idiopathic neuropathy, unspecified: Secondary | ICD-10-CM | POA: Diagnosis present

## 2017-01-12 DIAGNOSIS — Z923 Personal history of irradiation: Secondary | ICD-10-CM

## 2017-01-12 DIAGNOSIS — Z79899 Other long term (current) drug therapy: Secondary | ICD-10-CM | POA: Diagnosis not present

## 2017-01-12 DIAGNOSIS — C3431 Malignant neoplasm of lower lobe, right bronchus or lung: Principal | ICD-10-CM | POA: Diagnosis present

## 2017-01-12 DIAGNOSIS — Z8612 Personal history of poliomyelitis: Secondary | ICD-10-CM

## 2017-01-12 DIAGNOSIS — Z85118 Personal history of other malignant neoplasm of bronchus and lung: Secondary | ICD-10-CM | POA: Diagnosis not present

## 2017-01-12 DIAGNOSIS — I10 Essential (primary) hypertension: Secondary | ICD-10-CM | POA: Diagnosis present

## 2017-01-12 DIAGNOSIS — Z86718 Personal history of other venous thrombosis and embolism: Secondary | ICD-10-CM | POA: Diagnosis not present

## 2017-01-12 DIAGNOSIS — C3411 Malignant neoplasm of upper lobe, right bronchus or lung: Secondary | ICD-10-CM

## 2017-01-12 DIAGNOSIS — R000 Tachycardia, unspecified: Secondary | ICD-10-CM | POA: Diagnosis present

## 2017-01-12 DIAGNOSIS — E785 Hyperlipidemia, unspecified: Secondary | ICD-10-CM | POA: Diagnosis present

## 2017-01-12 HISTORY — PX: SEGMENTECOMY: SHX5076

## 2017-01-12 HISTORY — PX: VIDEO ASSISTED THORACOSCOPY: SHX5073

## 2017-01-12 LAB — PREPARE RBC (CROSSMATCH)

## 2017-01-12 LAB — BLOOD GAS, ARTERIAL
Acid-Base Excess: 1.4 mmol/L (ref 0.0–2.0)
Bicarbonate: 25.6 mmol/L (ref 20.0–28.0)
Drawn by: 421801
FIO2: 21
O2 Saturation: 97.3 %
PCO2 ART: 40.9 mmHg (ref 32.0–48.0)
PO2 ART: 95.4 mmHg (ref 83.0–108.0)
Patient temperature: 98.6
pH, Arterial: 7.412 (ref 7.350–7.450)

## 2017-01-12 LAB — MRSA PCR SCREENING: MRSA by PCR: NEGATIVE

## 2017-01-12 LAB — PROTIME-INR
INR: 0.96
Prothrombin Time: 12.8 seconds (ref 11.4–15.2)

## 2017-01-12 SURGERY — VIDEO ASSISTED THORACOSCOPY
Anesthesia: General | Site: Chest | Laterality: Right

## 2017-01-12 MED ORDER — ONDANSETRON HCL 4 MG/2ML IJ SOLN
4.0000 mg | Freq: Four times a day (QID) | INTRAMUSCULAR | Status: DC | PRN
  Filled 2017-01-12: qty 2

## 2017-01-12 MED ORDER — LOSARTAN POTASSIUM 50 MG PO TABS
50.0000 mg | ORAL_TABLET | Freq: Every day | ORAL | Status: DC
  Administered 2017-01-13 – 2017-01-15 (×3): 50 mg via ORAL
  Filled 2017-01-12 (×3): qty 1

## 2017-01-12 MED ORDER — PROPOFOL 10 MG/ML IV BOLUS
INTRAVENOUS | Status: AC
  Filled 2017-01-12: qty 20

## 2017-01-12 MED ORDER — LACTATED RINGERS IV SOLN
INTRAVENOUS | Status: DC | PRN
  Administered 2017-01-12 (×2): via INTRAVENOUS

## 2017-01-12 MED ORDER — BUPIVACAINE HCL (PF) 0.5 % IJ SOLN
INTRAMUSCULAR | Status: AC
Start: 2017-01-12 — End: 2017-01-12
  Filled 2017-01-12: qty 30

## 2017-01-12 MED ORDER — FENTANYL CITRATE (PF) 100 MCG/2ML IJ SOLN
INTRAMUSCULAR | Status: DC | PRN
  Administered 2017-01-12 (×2): 100 ug via INTRAVENOUS
  Administered 2017-01-12 (×2): 50 ug via INTRAVENOUS
  Administered 2017-01-12: 150 ug via INTRAVENOUS
  Administered 2017-01-12: 50 ug via INTRAVENOUS

## 2017-01-12 MED ORDER — DEXAMETHASONE SODIUM PHOSPHATE 10 MG/ML IJ SOLN
INTRAMUSCULAR | Status: AC
  Filled 2017-01-12: qty 1

## 2017-01-12 MED ORDER — OXYCODONE HCL 5 MG PO TABS: 5.0000 mg | ORAL_TABLET | Freq: Once | ORAL | Status: DC | PRN

## 2017-01-12 MED ORDER — PROPOFOL 10 MG/ML IV BOLUS
INTRAVENOUS | Status: DC | PRN
  Administered 2017-01-12: 160 mg via INTRAVENOUS
  Administered 2017-01-12: 40 mg via INTRAVENOUS

## 2017-01-12 MED ORDER — SODIUM CHLORIDE 0.9 % IR SOLN
Status: DC | PRN
  Administered 2017-01-12: 2000 mL

## 2017-01-12 MED ORDER — FENTANYL 40 MCG/ML IV SOLN
INTRAVENOUS | Status: DC
  Administered 2017-01-12: 20 ug via INTRAVENOUS
  Administered 2017-01-12: 17:00:00 via INTRAVENOUS
  Administered 2017-01-13: 20 ug via INTRAVENOUS
  Administered 2017-01-13: 0 ug via INTRAVENOUS
  Administered 2017-01-13: 110 ug via INTRAVENOUS
  Administered 2017-01-13: 10 ug via INTRAVENOUS
  Administered 2017-01-13: 40 ug via INTRAVENOUS
  Administered 2017-01-13: 60 ug via INTRAVENOUS
  Administered 2017-01-14: 70 ug via INTRAVENOUS
  Administered 2017-01-14: 20 ug via INTRAVENOUS
  Administered 2017-01-14: 40 ug via INTRAVENOUS
  Administered 2017-01-14: 110 ug via INTRAVENOUS
  Administered 2017-01-14 (×2): 10 ug via INTRAVENOUS
  Administered 2017-01-15: 20 ug via INTRAVENOUS
  Administered 2017-01-15 (×3): 0 ug via INTRAVENOUS
  Filled 2017-01-12 (×2): qty 25

## 2017-01-12 MED ORDER — ACETAMINOPHEN 500 MG PO TABS
1000.0000 mg | ORAL_TABLET | Freq: Four times a day (QID) | ORAL | Status: DC
  Administered 2017-01-12 – 2017-01-14 (×5): 1000 mg via ORAL
  Filled 2017-01-12 (×5): qty 2

## 2017-01-12 MED ORDER — ONDANSETRON HCL 4 MG/2ML IJ SOLN
INTRAMUSCULAR | Status: AC
  Filled 2017-01-12: qty 8

## 2017-01-12 MED ORDER — FENTANYL CITRATE (PF) 250 MCG/5ML IJ SOLN
INTRAMUSCULAR | Status: AC
  Filled 2017-01-12: qty 5

## 2017-01-12 MED ORDER — BUPIVACAINE HCL (PF) 0.5 % IJ SOLN
INTRAMUSCULAR | Status: DC | PRN
  Administered 2017-01-12: 10 mL

## 2017-01-12 MED ORDER — HYDROMORPHONE HCL 1 MG/ML IJ SOLN: 0.2500 mg | INTRAMUSCULAR | Status: DC | PRN

## 2017-01-12 MED ORDER — FENTANYL CITRATE (PF) 100 MCG/2ML IJ SOLN
INTRAMUSCULAR | Status: AC
  Administered 2017-01-12: 100 ug
  Filled 2017-01-12: qty 2

## 2017-01-12 MED ORDER — ROCURONIUM BROMIDE 50 MG/5ML IV SOSY
PREFILLED_SYRINGE | INTRAVENOUS | Status: AC
  Filled 2017-01-12: qty 10

## 2017-01-12 MED ORDER — LABETALOL HCL 5 MG/ML IV SOLN
INTRAVENOUS | Status: DC | PRN
  Administered 2017-01-12 (×4): 5 mg via INTRAVENOUS

## 2017-01-12 MED ORDER — ATORVASTATIN CALCIUM 10 MG PO TABS
10.0000 mg | ORAL_TABLET | Freq: Every evening | ORAL | Status: DC
  Administered 2017-01-12 – 2017-01-15 (×4): 10 mg via ORAL
  Filled 2017-01-12 (×4): qty 1

## 2017-01-12 MED ORDER — SODIUM CHLORIDE 0.9 % IV SOLN
INTRAVENOUS | Status: DC
  Administered 2017-01-13: 100 mL via INTRAVENOUS

## 2017-01-12 MED ORDER — DEXTROSE 5 % IV SOLN
1.5000 g | Freq: Two times a day (BID) | INTRAVENOUS | Status: AC
  Administered 2017-01-12 – 2017-01-13 (×2): 1.5 g via INTRAVENOUS
  Filled 2017-01-12 (×2): qty 1.5

## 2017-01-12 MED ORDER — SUGAMMADEX SODIUM 200 MG/2ML IV SOLN
INTRAVENOUS | Status: AC
  Filled 2017-01-12: qty 2

## 2017-01-12 MED ORDER — DIPHENHYDRAMINE HCL 12.5 MG/5ML PO ELIX
12.5000 mg | ORAL_SOLUTION | Freq: Four times a day (QID) | ORAL | Status: DC | PRN
  Filled 2017-01-12: qty 5

## 2017-01-12 MED ORDER — CARVEDILOL 3.125 MG PO TABS
3.1250 mg | ORAL_TABLET | Freq: Two times a day (BID) | ORAL | Status: DC
  Administered 2017-01-13 – 2017-01-15 (×6): 3.125 mg via ORAL
  Filled 2017-01-12 (×6): qty 1

## 2017-01-12 MED ORDER — SUGAMMADEX SODIUM 200 MG/2ML IV SOLN
INTRAVENOUS | Status: DC | PRN
  Administered 2017-01-12: 200 mg via INTRAVENOUS

## 2017-01-12 MED ORDER — DEXTROSE 5 % IV SOLN
1.5000 g | INTRAVENOUS | Status: AC
  Administered 2017-01-12: 1.5 g via INTRAVENOUS

## 2017-01-12 MED ORDER — BUPIVACAINE ON-Q PAIN PUMP (FOR ORDER SET NO CHG): INJECTION | Status: DC

## 2017-01-12 MED ORDER — PHENYLEPHRINE 40 MCG/ML (10ML) SYRINGE FOR IV PUSH (FOR BLOOD PRESSURE SUPPORT)
PREFILLED_SYRINGE | INTRAVENOUS | Status: DC | PRN
  Administered 2017-01-12: 120 ug via INTRAVENOUS
  Administered 2017-01-12 (×2): 80 ug via INTRAVENOUS
  Administered 2017-01-12 (×2): 120 ug via INTRAVENOUS

## 2017-01-12 MED ORDER — MUPIROCIN 2 % EX OINT: 1.0000 "application " | TOPICAL_OINTMENT | Freq: Two times a day (BID) | CUTANEOUS | Status: DC

## 2017-01-12 MED ORDER — BUPIVACAINE 0.5 % ON-Q PUMP SINGLE CATH 400 ML
INJECTION | Status: DC | PRN
  Administered 2017-01-12: 400 mL

## 2017-01-12 MED ORDER — ENOXAPARIN SODIUM 40 MG/0.4ML ~~LOC~~ SOLN
40.0000 mg | Freq: Every day | SUBCUTANEOUS | Status: DC
  Administered 2017-01-13 – 2017-01-15 (×3): 40 mg via SUBCUTANEOUS
  Filled 2017-01-12 (×3): qty 0.4

## 2017-01-12 MED ORDER — OXYCODONE HCL 5 MG/5ML PO SOLN: 5.0000 mg | Freq: Once | ORAL | Status: DC | PRN

## 2017-01-12 MED ORDER — DEXAMETHASONE SODIUM PHOSPHATE 10 MG/ML IJ SOLN
INTRAMUSCULAR | Status: DC | PRN
  Administered 2017-01-12: 10 mg via INTRAVENOUS

## 2017-01-12 MED ORDER — ROCURONIUM BROMIDE 100 MG/10ML IV SOLN
INTRAVENOUS | Status: DC | PRN
  Administered 2017-01-12: 50 mg via INTRAVENOUS
  Administered 2017-01-12: 30 mg via INTRAVENOUS
  Administered 2017-01-12 (×2): 20 mg via INTRAVENOUS

## 2017-01-12 MED ORDER — SENNOSIDES-DOCUSATE SODIUM 8.6-50 MG PO TABS
1.0000 | ORAL_TABLET | Freq: Every day | ORAL | Status: DC
  Administered 2017-01-13: 1 via ORAL
  Filled 2017-01-12: qty 1

## 2017-01-12 MED ORDER — MIDAZOLAM HCL 2 MG/2ML IJ SOLN
INTRAMUSCULAR | Status: AC
Start: 2017-01-12 — End: 2017-01-12
  Administered 2017-01-12: 2 mg
  Filled 2017-01-12: qty 2

## 2017-01-12 MED ORDER — LATANOPROST 0.005 % OP SOLN
1.0000 [drp] | Freq: Every day | OPHTHALMIC | Status: DC
  Administered 2017-01-12 – 2017-01-14 (×3): 1 [drp] via OPHTHALMIC
  Filled 2017-01-12: qty 2.5

## 2017-01-12 MED ORDER — TRAMADOL HCL 50 MG PO TABS: 50.0000 mg | ORAL_TABLET | Freq: Four times a day (QID) | ORAL | Status: DC | PRN

## 2017-01-12 MED ORDER — ACETAMINOPHEN 160 MG/5ML PO SOLN
1000.0000 mg | Freq: Four times a day (QID) | ORAL | Status: DC
  Administered 2017-01-14 – 2017-01-15 (×7): 1000 mg via ORAL
  Filled 2017-01-12 (×8): qty 40.6

## 2017-01-12 MED ORDER — NALOXONE HCL 0.4 MG/ML IJ SOLN
0.4000 mg | INTRAMUSCULAR | Status: DC | PRN
  Filled 2017-01-12: qty 1

## 2017-01-12 MED ORDER — ADULT MULTIVITAMIN W/MINERALS CH
1.0000 | ORAL_TABLET | Freq: Every day | ORAL | Status: DC
  Administered 2017-01-13 – 2017-01-15 (×3): 1 via ORAL
  Filled 2017-01-12 (×3): qty 1

## 2017-01-12 MED ORDER — ONDANSETRON HCL 4 MG/2ML IJ SOLN: 4.0000 mg | Freq: Four times a day (QID) | INTRAMUSCULAR | Status: DC | PRN

## 2017-01-12 MED ORDER — ALBUTEROL SULFATE HFA 108 (90 BASE) MCG/ACT IN AERS
INHALATION_SPRAY | RESPIRATORY_TRACT | Status: DC | PRN
  Administered 2017-01-12 (×2): 6 via RESPIRATORY_TRACT

## 2017-01-12 MED ORDER — BUPIVACAINE 0.5 % ON-Q PUMP SINGLE CATH 400 ML
400.0000 mL | INJECTION | Status: DC
  Filled 2017-01-12: qty 400

## 2017-01-12 MED ORDER — DEXTROSE 5 % IV SOLN
INTRAVENOUS | Status: AC
  Filled 2017-01-12: qty 1.5

## 2017-01-12 MED ORDER — POTASSIUM CHLORIDE 2 MEQ/ML IV SOLN
30.0000 meq | Freq: Every day | INTRAVENOUS | Status: DC | PRN
  Filled 2017-01-12: qty 15

## 2017-01-12 MED ORDER — LACTATED RINGERS IV SOLN
INTRAVENOUS | Status: DC | PRN
  Administered 2017-01-12: 13:00:00 via INTRAVENOUS

## 2017-01-12 MED ORDER — PHENYLEPHRINE HCL 10 MG/ML IJ SOLN
INTRAMUSCULAR | Status: DC | PRN
  Administered 2017-01-12: 50 ug/min via INTRAVENOUS

## 2017-01-12 MED ORDER — SODIUM CHLORIDE 0.9% FLUSH: 9.0000 mL | INTRAVENOUS | Status: DC | PRN

## 2017-01-12 MED ORDER — DIPHENHYDRAMINE HCL 50 MG/ML IJ SOLN
12.5000 mg | Freq: Four times a day (QID) | INTRAMUSCULAR | Status: DC | PRN
  Filled 2017-01-12: qty 0.25

## 2017-01-12 MED ORDER — FERROUS SULFATE 325 (65 FE) MG PO TABS
325.0000 mg | ORAL_TABLET | Freq: Every evening | ORAL | Status: DC
  Administered 2017-01-12 – 2017-01-15 (×4): 325 mg via ORAL
  Filled 2017-01-12 (×4): qty 1

## 2017-01-12 MED ORDER — OXYCODONE HCL 5 MG PO TABS: 5.0000 mg | ORAL_TABLET | ORAL | Status: DC | PRN

## 2017-01-12 MED ORDER — LEVALBUTEROL HCL 0.63 MG/3ML IN NEBU
0.6300 mg | INHALATION_SOLUTION | Freq: Four times a day (QID) | RESPIRATORY_TRACT | Status: DC
  Administered 2017-01-12 – 2017-01-13 (×2): 0.63 mg via RESPIRATORY_TRACT
  Filled 2017-01-12 (×2): qty 3

## 2017-01-12 MED ORDER — BISACODYL 5 MG PO TBEC
10.0000 mg | DELAYED_RELEASE_TABLET | Freq: Every day | ORAL | Status: DC
  Administered 2017-01-13: 10 mg via ORAL
  Filled 2017-01-12: qty 2

## 2017-01-12 SURGICAL SUPPLY — 91 items
ADH SKN CLS APL DERMABOND .7 (GAUZE/BANDAGES/DRESSINGS) ×2
APPLIER CLIP 5 13 M/L LIGAMAX5 (MISCELLANEOUS) ×3
APPLIER CLIP ROT 10 11.4 M/L (STAPLE)
APR CLP MED LRG 11.4X10 (STAPLE)
APR CLP MED LRG 5 ANG JAW (MISCELLANEOUS) ×2
BAG SPEC RTRVL LRG 6X4 10 (ENDOMECHANICALS) ×2
CANISTER SUCTION 2500CC (MISCELLANEOUS) ×3 IMPLANT
CATH KIT ON Q 5IN SLV (PAIN MANAGEMENT) IMPLANT
CATH KIT ON-Q SILVERSOAK 5 (CATHETERS) IMPLANT
CATH KIT ON-Q SILVERSOAK 5IN (CATHETERS) ×3 IMPLANT
CATH THORACIC 28FR (CATHETERS) IMPLANT
CATH THORACIC 28FR RT ANG (CATHETERS) IMPLANT
CATH THORACIC 36FR (CATHETERS) IMPLANT
CATH THORACIC 36FR RT ANG (CATHETERS) IMPLANT
CLIP APPLIE 5 13 M/L LIGAMAX5 (MISCELLANEOUS) IMPLANT
CLIP APPLIE ROT 10 11.4 M/L (STAPLE) IMPLANT
CLIP TI MEDIUM 6 (CLIP) IMPLANT
CLIP TI WIDE RED SMALL 24 (CLIP) ×1 IMPLANT
CONN Y 3/8X3/8X3/8  BEN (MISCELLANEOUS) ×1
CONN Y 3/8X3/8X3/8 BEN (MISCELLANEOUS) ×2 IMPLANT
CONT SPEC 4OZ CLIKSEAL STRL BL (MISCELLANEOUS) ×6 IMPLANT
COVER SURGICAL LIGHT HANDLE (MISCELLANEOUS) ×3 IMPLANT
DERMABOND ADVANCED (GAUZE/BANDAGES/DRESSINGS) ×1
DERMABOND ADVANCED .7 DNX12 (GAUZE/BANDAGES/DRESSINGS) IMPLANT
DRAIN CHANNEL 28F RND 3/8 FF (WOUND CARE) IMPLANT
DRAIN CHANNEL 32F RND 10.7 FF (WOUND CARE) IMPLANT
DRAPE LAPAROSCOPIC ABDOMINAL (DRAPES) ×3 IMPLANT
DRAPE WARM FLUID 44X44 (DRAPE) ×3 IMPLANT
ELECT REM PT RETURN 9FT ADLT (ELECTROSURGICAL) ×3
ELECTRODE REM PT RTRN 9FT ADLT (ELECTROSURGICAL) ×2 IMPLANT
GAUZE SPONGE 4X4 12PLY STRL (GAUZE/BANDAGES/DRESSINGS) ×3 IMPLANT
GLOVE BIO SURGEON STRL SZ 6.5 (GLOVE) ×2 IMPLANT
GLOVE BIO SURGEON STRL SZ7 (GLOVE) ×3 IMPLANT
GLOVE SURG SIGNA 7.5 PF LTX (GLOVE) ×6 IMPLANT
GOWN STRL REUS W/ TWL LRG LVL3 (GOWN DISPOSABLE) ×4 IMPLANT
GOWN STRL REUS W/ TWL XL LVL3 (GOWN DISPOSABLE) ×2 IMPLANT
GOWN STRL REUS W/TWL LRG LVL3 (GOWN DISPOSABLE) ×6
GOWN STRL REUS W/TWL XL LVL3 (GOWN DISPOSABLE) ×3
HEMOSTAT SURGICEL 2X14 (HEMOSTASIS) IMPLANT
IV CATH 22GX1 FEP (IV SOLUTION) IMPLANT
KIT BASIN OR (CUSTOM PROCEDURE TRAY) ×3 IMPLANT
KIT ROOM TURNOVER OR (KITS) ×3 IMPLANT
KIT SUCTION CATH 14FR (SUCTIONS) ×3 IMPLANT
NS IRRIG 1000ML POUR BTL (IV SOLUTION) ×6 IMPLANT
PACK CHEST (CUSTOM PROCEDURE TRAY) ×3 IMPLANT
PAD ARMBOARD 7.5X6 YLW CONV (MISCELLANEOUS) ×6 IMPLANT
POUCH ENDO CATCH II 15MM (MISCELLANEOUS) IMPLANT
POUCH SPECIMEN RETRIEVAL 10MM (ENDOMECHANICALS) ×1 IMPLANT
RELOAD GREEN ECHELON 45 (STAPLE) ×2 IMPLANT
RELOAD STAPLE 35X2.5 WHT THIN (STAPLE) IMPLANT
RELOAD STAPLE 45 GOLD REG/THCK (STAPLE) IMPLANT
SEALANT PROGEL (MISCELLANEOUS) IMPLANT
SEALANT SURG COSEAL 4ML (VASCULAR PRODUCTS) IMPLANT
SEALANT SURG COSEAL 8ML (VASCULAR PRODUCTS) IMPLANT
SOLUTION ANTI FOG 6CC (MISCELLANEOUS) ×3 IMPLANT
SPECIMEN JAR MEDIUM (MISCELLANEOUS) ×3 IMPLANT
SPONGE GAUZE 4X4 12PLY STER LF (GAUZE/BANDAGES/DRESSINGS) ×1 IMPLANT
SPONGE INTESTINAL PEANUT (DISPOSABLE) ×3 IMPLANT
SPONGE TONSIL 1 RF SGL (DISPOSABLE) ×3 IMPLANT
STAPLE RELOAD 2.5MM WHITE (STAPLE) ×6 IMPLANT
STAPLE RELOAD 45MM GOLD (STAPLE) ×30 IMPLANT
STAPLER ECHELON FLEX (STAPLE) ×1 IMPLANT
STAPLER VASCULAR ECHELON 35 (CUTTER) ×1 IMPLANT
SUT PROLENE 4 0 RB 1 (SUTURE)
SUT PROLENE 4-0 RB1 .5 CRCL 36 (SUTURE) IMPLANT
SUT SILK  1 MH (SUTURE) ×2
SUT SILK 1 MH (SUTURE) ×4 IMPLANT
SUT SILK 2 0SH CR/8 30 (SUTURE) IMPLANT
SUT SILK 3 0SH CR/8 30 (SUTURE) ×1 IMPLANT
SUT VIC AB 1 CTX 36 (SUTURE)
SUT VIC AB 1 CTX36XBRD ANBCTR (SUTURE) IMPLANT
SUT VIC AB 2-0 CTX 36 (SUTURE) IMPLANT
SUT VIC AB 2-0 UR6 27 (SUTURE) IMPLANT
SUT VIC AB 3-0 MH 27 (SUTURE) IMPLANT
SUT VIC AB 3-0 X1 27 (SUTURE) ×3 IMPLANT
SUT VICRYL 2 TP 1 (SUTURE) IMPLANT
SWAB COLLECTION DEVICE MRSA (MISCELLANEOUS) IMPLANT
SYR 20CC LL (SYRINGE) IMPLANT
SYSTEM SAHARA CHEST DRAIN ATS (WOUND CARE) ×3 IMPLANT
TAPE CLOTH 4X10 WHT NS (GAUZE/BANDAGES/DRESSINGS) ×3 IMPLANT
TAPE CLOTH SURG 4X10 WHT LF (GAUZE/BANDAGES/DRESSINGS) ×1 IMPLANT
TIP APPLICATOR SPRAY EXTEND 16 (VASCULAR PRODUCTS) IMPLANT
TOWEL OR 17X24 6PK STRL BLUE (TOWEL DISPOSABLE) ×3 IMPLANT
TOWEL OR 17X26 10 PK STRL BLUE (TOWEL DISPOSABLE) ×6 IMPLANT
TRAP SPECIMEN MUCOUS 40CC (MISCELLANEOUS) IMPLANT
TRAY FOLEY CATH 16FRSI W/METER (SET/KITS/TRAYS/PACK) ×3 IMPLANT
TROCAR XCEL BLADELESS 5X75MML (TROCAR) ×3 IMPLANT
TROCAR XCEL NON-BLD 5MMX100MML (ENDOMECHANICALS) IMPLANT
TUBE ANAEROBIC SPECIMEN COL (MISCELLANEOUS) IMPLANT
TUNNELER SHEATH ON-Q 11GX8 DSP (PAIN MANAGEMENT) ×1 IMPLANT
WATER STERILE IRR 1000ML POUR (IV SOLUTION) ×6 IMPLANT

## 2017-01-12 NOTE — Interval H&P Note (Signed)
History and Physical Interval Note: Had infected toe after pedicure. Treated with antibiotics and has resolved. For RLL superior segmentectomy today  01/12/2017 12:00 PM  [name]  has presented today for surgery, with the diagnosis of RLL NODULE  The various methods of treatment have been discussed with the patient and family. After consideration of risks, benefits and other options for treatment, the patient has consented to  Procedure(s): VIDEO ASSISTED THORACOSCOPY (Right) RIGHT SUPERIOR SEGMENTECTOMY (Right) as a surgical intervention .  The patient's history has been reviewed, patient examined, no change in status, stable for surgery.  I have reviewed the patient's chart and labs.  Questions were answered to the patient's satisfaction.     Melrose Nakayama

## 2017-01-12 NOTE — H&P (View-Only) (Signed)
PCP is Gennette Pac, MD Referring Provider is Tanda Rockers, MD  Chief Complaint  Patient presents with  . Lung Lesion    PET Scan 12/17/16, Chest CT 09/17/16, PFT's 10/06/16, HX of LULobectomy 2005    HPI: [name] is sent for consultation regarding a right lower lobe lung nodule.  [name] is a 71 year old gentleman with a history of tobacco abuse (quit in 2005), lung cancer, peripheral neuropathy, hypertension, hyperlipidemia, ischemic colitis, left subclavian vein stent and thrombosis, polio, and glaucoma. 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 He had adjuvant chemotherapy and radiation. He completed all treatments of November 2005. I followed him for 5 years with no evidence of recurrent disease.  He has been followed recently with low-dose CT scans on an annual basis. In September he had a CT which was abnormal showing a 3 cm groundglass opacity in the superior segment of the right lower lobe with an adjacent cystic area. He saw Dr. Melvyn Novas. He was concerned this could be a new lung cancer. We discussed the films and decided to get an interval follow-up at 3 months but to do a PET/CT to see if there was any metabolic activity associated with the lesion. He had a PET/CT which showed the lesion was still present. There was not significant metabolic activity associated with it.  Past Medical History:  Diagnosis Date  . Cancer ([place])    lung cancer  . Glaucoma   . Hereditary and idiopathic peripheral neuropathy 09/26/2014  . Hyperlipidemia   . Hypertension   . Ischemic colitis ([place])   . 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 Polio     Past Surgical History:  Procedure Laterality Date  . HERNIA REPAIR     left ingunal  . LUNG REMOVAL, PARTIAL    . TONSILLECTOMY      Family History  Problem Relation Age of Onset  . Cancer Mother     intestine  . Hyperlipidemia Mother   . Heart Problems Brother   . Heart attack Brother 97     s/p CABG and redo 5 years later  . Heart attack Father   . Heart disease Father   . Colon cancer Maternal Grandmother   . Colon cancer Maternal Aunt     Social History Social History  Substance Use Topics  . Smoking status: Former Smoker    Packs/day: 2.00    Years: 40.00    Types: Cigarettes  . Smokeless tobacco: Never Used  . Alcohol use Yes     Comment: occasionally- Vodka 4 to five drinks a night    Current Outpatient Prescriptions  Medication Sig Dispense Refill  . atorvastatin (LIPITOR) 10 MG tablet Take 10 mg by mouth daily.    . carvedilol (COREG) 3.125 MG tablet Take 3.125 mg by mouth 2 (two) times daily with a meal.    . ferrous sulfate 325 (65 FE) MG tablet Take 325 mg by mouth daily with breakfast.    . ipratropium (ATROVENT HFA) 17 MCG/ACT inhaler Inhale 2 puffs into the lungs every 6 (six) hours. For wheezing    . latanoprost (XALATAN) 0.005 % ophthalmic solution Place 1 drop into both eyes at bedtime.    Marland Kitchen losartan (COZAAR) 50 MG tablet Take 50 mg by mouth daily.    . Multiple Vitamin (MULTIVITAMIN) capsule Take 1 capsule by mouth daily.    . Naphazoline-Glycerin (CLEAR EYES REDNESS RELIEF OP) Apply 2 drops to eye daily.    Marland Kitchen  warfarin (COUMADIN) 5 MG tablet Take 1 tablet (5 mg total) by mouth as directed. (Patient taking differently: Take 5-15 mg by mouth as directed. ) 125 tablet 4   No current facility-administered medications for this visit.     No Known Allergies  Review of Systems  Constitutional: Negative for appetite change, chills, fever and unexpected weight change.  HENT: Negative for trouble swallowing and voice change.   Respiratory: Negative for cough, chest tightness and wheezing.   Gastrointestinal: Negative for abdominal pain and blood in stool.  Genitourinary: Positive for frequency. Negative for difficulty urinating.  Neurological: Negative for seizures, syncope and headaches.  Hematological: Negative for adenopathy. Does not bruise/bleed  easily.    BP 137/79   Pulse (!) 128   Resp 20   Ht '5\' 9"'$  (1.753 m)   Wt 223 lb (101.2 kg)   BMI 32.93 kg/m  Physical Exam  Constitutional: He is oriented to person, place, and time. He appears well-developed and well-nourished. No distress.  HENT:  Head: Normocephalic and atraumatic.  Mouth/Throat: No oropharyngeal exudate.  Eyes: Conjunctivae and EOM are normal. No scleral icterus.  Cardiovascular: Regular rhythm.  Exam reveals no gallop.   No murmur heard. Tachycardic at 108  Pulmonary/Chest: No respiratory distress. He has no wheezes. He has no rales.  Diminished breath sounds at left base  Abdominal: Soft. He exhibits no distension. There is no tenderness.  Musculoskeletal: He exhibits no edema.  Neurological: He is alert and oriented to person, place, and time. No cranial nerve deficit.  Skin: Skin is warm and dry.  Vitals reviewed.    Diagnostic Tests: CT CHEST WITH CONTRAST  TECHNIQUE: Multidetector CT imaging of the chest was performed during intravenous contrast administration.  CONTRAST:  72m ISOVUE-300 IOPAMIDOL (ISOVUE-300) INJECTION 61%  COMPARISON:  CT scan of December 02, 2014 and April 05, 2012.  FINDINGS: Cardiovascular: Atherosclerosis of thoracic aorta is noted without aneurysm or dissection. Mild coronary artery calcifications are noted.  Mediastinum/Nodes: No significant mediastinal mass or adenopathy is noted.  Lungs/Pleura: Stable left apical thickening is noted consistent with history of left upper lobectomy. No pneumothorax or pleural effusion is noted. Stable 4 mm nodule is noted in right middle lobe best seen on image number 85 of series 5. Ground-glass opacity with central cavitation is seen in superior segment of right lower lobe which measures 3.1 x 2.2 cm. This appears to have progressed since prior exam. It appears to extend to the major fissure as well.  Upper Abdomen: Fatty infiltration of the liver is noted. No  other significant abnormality seen in the visualized portion the abdomen.  Musculoskeletal: Old left upper rib fractures are noted consistent were postoperative change.  IMPRESSION: Aortic atherosclerosis.  Coronary artery calcifications are noted suggesting coronary artery disease.  Postsurgical changes related to left upper lobectomy are noted and stable.  3.1 x 2.2 cm ground-glass opacity with central cavitation is seen in superior segment of right lower lobe which appears more prominent compared to prior exam. Adenocarcinoma cannot be excluded. Follow up by CT is recommended in 12 months, with continued annual surveillance for a minimum of 3 years.These recommendations are taken from:Recommendations for the Management of Subsolid Pulmonary Nodules Detected at CT: A Statement from the [facility] 2013; 266:1, [phone]   Electronically Signed   By: JMarijo Conception M.D.   On: 09/17/2016 14:40 [facility] PET SKULL BASE TO THIGH  TECHNIQUE: 11.0 mCi F-18 FDG was injected intravenously. Full-ring PET imaging was performed from  the skull base to thigh after the radiotracer. CT data was obtained and used for attenuation correction and anatomic localization.  FASTING BLOOD GLUCOSE:  Value: 98 mg/dl  COMPARISON:  09/17/2016 chest CT.  06/13/2008 PET-CT.  FINDINGS: NECK  No hypermetabolic lymph nodes in the neck.  CHEST  Left anterior descending, left circumflex and right coronary atherosclerosis. Atherosclerotic nonaneurysmal thoracic aorta. Left subclavian vein stent is in place. No hypermetabolic axillary, mediastinal or hilar lymph nodes. No pleural effusions. No pneumothorax. Status post left upper lobectomy. Mild-to-moderate centrilobular emphysema.  There is a 3.0 x 2.4 cm ground-glass pulmonary nodule with central cystic change in the superior segment right lower lobe (series 8/image 29) without significant metabolism (max  SUV 1.3), not appreciably change from 2.9 x 2.3 cm on 09/17/2016 chest CT using similar measurement technique, increased from 1.5 x 1.1 cm on 12/02/2014 chest CT.  Right middle lobe 4 mm solid pulmonary nodule (series 8/image 42) is below PET resolution and stable back to 12/02/2014 and considered benign. No acute consolidative airspace disease or additional significant pulmonary nodules.  ABDOMEN/PELVIS  No abnormal hypermetabolic activity within the liver, pancreas, adrenal glands, or spleen. No hypermetabolic lymph nodes in the abdomen or pelvis. Diffuse hepatic steatosis. Atherosclerotic nonaneurysmal abdominal aorta. Stable hernia repair mesh in the ventral left pelvis. Nonspecific stable chronic mild haziness of the central mesenteric fat without hypermetabolic adenopathy, mass or fluid collection, most suggestive of chronic mesenteric panniculitis.  SKELETON  No focal hypermetabolic activity to suggest skeletal metastasis. Chronic healed deformity in the left posterior sixth rib is unchanged back to 12/02/2014, probably posttraumatic.  IMPRESSION: 1. Non-hypermetabolic ground-glass 3.0 cm pulmonary nodule with central cystic change in the superior segment right lower lobe. This nodule remains suspicious for indolent primary bronchogenic adenocarcinoma given growth since 10/02/2014 chest CT. Multidisciplinary thoracic oncology review advised. Continued chest CT surveillance in 6-12 months advised at a minimum. 2. No hypermetabolic metastatic disease. 3. 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   Electronically Signed   By: Ilona Sorrel M.D.   On: 12/17/2016 12:09  Pulmonary function testing FVC 2.57 (60%) FEV1 1.47 (47%) FEV1  1.67 (53%) post bronchodilator DLCO 19.83 (65%)  I personally  reviewed the CT and PET CT images and concur with the findings noted above.  Impression: [name] is a 71 year old gentleman who is a former smoker. He has a history of a T3 N0 adenocarcinoma treated with surgical resection followed by chemotherapy and radiation due to proximity to the subclavian vessels. 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 He does remain on Coumadin for that. He now is over 10 years out with no evidence of recurrent disease.  He now has a new groundglass opacity in the superior segment of the right lower lobe. 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 There are a couple of other groundglass opacities that are smaller in the right upper lobe adjacent to the superior segment.   I discussed 3 potential options with Mr. and Mrs. Everett. One would be continued radiographic observation. That would not be unreasonable given the PET findings. The second would be to attempt a biopsy. This could be done either CT-guided or bronchoscopic. Both would be subject to a fairly high false negative rate. The third and final option would be to do a right VATS for a right lower lobe superior segmentectomy to  resect the nodule for definitive diagnosis and treatment. We would also try to localize and wedge out the right upper lobe nodules the same time. I think any of those options are reasonable. Although I this is highly likely to be a low-grade adenocarcinoma and would favor surgical resection. We discussed the relative advantages and disadvantages of each of those approaches.  I have discussed with the general nature of Right VATS for segementectomy, including the need for general anesthesia, and the incisions to be used with Mr. and Mrs. Everett. We discussed the expected hospital stay, overall recovery and short and long term outcomes. I informed him of the indications, risks, benefits, and alternatives  (radiographic observation versus biopsy). They understand the risks include, but are not limited to death, stroke, MI, DVT/PE, bleeding, possible need for transfusion, infections, cardiac arrhythmias, prolonged air leaks, as well as other organ system dysfunction including respiratory, renal, or GI complications.  After discussing the options he wishes to be aggressive and have the lesion removed surgically.   Plan: Right VATS for superior segmentectomy and possible wedge resection of right upper lobe  Melrose Nakayama, MD [facility] [phone]

## 2017-01-12 NOTE — Anesthesia Procedure Notes (Signed)
Central Venous Catheter Insertion Performed by: Albertha Ghee, anesthesiologist Start/End1/24/2018 12:37 PM, 01/12/2017 12:44 PM Patient location: Pre-op. Preanesthetic checklist: patient identified, IV checked, site marked, risks and benefits discussed, surgical consent, monitors and equipment checked, pre-op evaluation, timeout performed and anesthesia consent Position: Trendelenburg Lidocaine 1% used for infiltration and patient sedated Hand hygiene performed , maximum sterile barriers used  and Seldinger technique used Catheter size: 7 Fr Central line was placed.Double lumen Procedure performed using ultrasound guided technique. Ultrasound Notes:anatomy identified, needle tip was noted to be adjacent to the nerve/plexus identified and no ultrasound evidence of intravascular and/or intraneural injection Attempts: 1 Following insertion, line sutured, dressing applied and Biopatch. Post procedure assessment: blood return through all ports, free fluid flow and no air  Patient tolerated the procedure well with no immediate complications.

## 2017-01-12 NOTE — Brief Op Note (Addendum)
01/12/2017  4:12 PM  PATIENT:  [name]  71 y.o. male  PRE-OPERATIVE DIAGNOSIS:  RLL NODULE  POST-OPERATIVE DIAGNOSIS: ADENOCARCINOMA RIGHT LOWER LOBE   PROCEDURE:  Procedure(s):  VIDEO ASSISTED THORACOSCOPY (Right)  RIGHT SUPERIOR SEGMENTECTOMY (Right)  LYMPH NODE DISSECTION  INSERTION OF ON-Q ANESTHETIC CATHETER  SURGEON:  Surgeon(s) and Role:    * Melrose Nakayama, MD - Primary  PHYSICIAN ASSISTANT: Ellwood Handler PA-C, Kelly Rayburn PA-S  ANESTHESIA:   general  EBL:  Total I/O In: 1400 [I.V.:1400] Out: 320 [Urine:220; Blood:100]  BLOOD ADMINISTERED:none  DRAINS: 28 Straight Chest tube   LOCAL MEDICATIONS USED:  MARCAINE     SPECIMEN:  Source of Specimen:  Right Superior Segment RLL, Lymph Nodes  DISPOSITION OF SPECIMEN:  PATHOLOGY  COUNTS:  YES  PLAN OF CARE: Admit to inpatient   PATIENT DISPOSITION:  ICU - extubated and stable.   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

## 2017-01-12 NOTE — Anesthesia Postprocedure Evaluation (Signed)
Anesthesia Post Note  Patient: [name]  Procedure(s) Performed: Procedure(s) (LRB): VIDEO ASSISTED THORACOSCOPY (Right) RIGHT SUPERIOR SEGMENTECTOMY (Right)  Patient location during evaluation: PACU Anesthesia Type: General Level of consciousness: awake and alert and patient cooperative Pain management: pain level controlled Vital Signs Assessment: post-procedure vital signs reviewed and stable Respiratory status: spontaneous breathing and respiratory function stable Cardiovascular status: stable Anesthetic complications: no       Last Vitals:  Vitals:   01/12/17 1700 01/12/17 1727  BP: (!) 143/64   Pulse: 71 73  Resp: 19 11  Temp:  36.1 C    Last Pain:  Vitals:   01/12/17 1041  TempSrc: Oral                 Demica Zook S

## 2017-01-12 NOTE — Transfer of Care (Signed)
Immediate Anesthesia Transfer of Care Note  Patient: [name]  Procedure(s) Performed: Procedure(s): VIDEO ASSISTED THORACOSCOPY (Right) RIGHT SUPERIOR SEGMENTECTOMY (Right)  Patient Location: PACU  Anesthesia Type:General  Level of Consciousness: awake, alert  and patient cooperative  Airway & Oxygen Therapy: Patient Spontanous Breathing and Patient connected to nasal cannula oxygen  Post-op Assessment: Report given to RN and Post -op Vital signs reviewed and stable  Post vital signs: Reviewed and stable  Last Vitals:  Vitals:   01/12/17 1041 01/12/17 1628  BP: (!) 174/84 (!) (P) 146/109  Pulse: 86 (P) 78  Resp: 20 (P) 15  Temp: 36.9 C (P) 36.4 C    Last Pain:  Vitals:   01/12/17 1041  TempSrc: Oral         Complications: No apparent anesthesia complications

## 2017-01-12 NOTE — Anesthesia Preprocedure Evaluation (Signed)
Anesthesia Evaluation  Patient identified by MRN, date of birth, ID band Patient awake    Reviewed: Allergy & Precautions, H&P , NPO status , Patient's Chart, lab work & pertinent test results  Airway Mallampati: II   Neck ROM: full    Dental   Pulmonary sleep apnea , COPD, former smoker,  RLL nodule   breath sounds clear to auscultation       Cardiovascular hypertension,  Rhythm:regular Rate:Normal     Neuro/Psych  Neuromuscular disease    GI/Hepatic   Endo/Other  obese  Renal/GU      Musculoskeletal   Abdominal   Peds  Hematology   Anesthesia Other Findings   Reproductive/Obstetrics                             Anesthesia Physical Anesthesia Plan  ASA: III  Anesthesia Plan: General   Post-op Pain Management:    Induction: Intravenous  Airway Management Planned: Double Lumen EBT  Additional Equipment: Arterial line and CVP  Intra-op Plan:   Post-operative Plan:   Informed Consent: I have reviewed the patients History and Physical, chart, labs and discussed the procedure including the risks, benefits and alternatives for the proposed anesthesia with the patient or authorized representative who has indicated his/her understanding and acceptance.     Plan Discussed with: CRNA, Anesthesiologist and Surgeon  Anesthesia Plan Comments:         Anesthesia Quick Evaluation

## 2017-01-12 NOTE — Anesthesia Procedure Notes (Signed)
Procedure Name: Intubation Date/Time: 01/12/2017 1:14 PM Performed by: Salli Quarry Brandace Cargle Pre-anesthesia Checklist: Patient identified, Emergency Drugs available, Suction available and Patient being monitored Patient Re-evaluated:Patient Re-evaluated prior to inductionOxygen Delivery Method: Circle System Utilized Preoxygenation: Pre-oxygenation with 100% oxygen Intubation Type: IV induction Ventilation: Mask ventilation without difficulty and Oral airway inserted - appropriate to patient size Laryngoscope Size: Mac and 4 Grade View: Grade III Endobronchial tube: Left, Double lumen EBT, EBT position confirmed by auscultation and EBT position confirmed by fiberoptic bronchoscope and 39 Fr Number of attempts: 2 Airway Equipment and Method: Stylet and Oral airway Placement Confirmation: ETT inserted through vocal cords under direct vision,  positive ETCO2 and breath sounds checked- equal and bilateral Secured at: 31 cm Tube secured with: Tape Dental Injury: Teeth and Oropharynx as per pre-operative assessment  Comments: DLX1 by Gerald Stabs, SRNA with MAC 4, grade 4 view; DLx2 by K. White, CRNA with MAC 4, grade 3 view. Atraumatic intubation of 65F DLT, BBS=, +ETCO2, tube secured

## 2017-01-13 ENCOUNTER — Inpatient Hospital Stay (HOSPITAL_COMMUNITY): Payer: Medicare Other

## 2017-01-13 ENCOUNTER — Encounter (HOSPITAL_COMMUNITY): Payer: Self-pay | Admitting: Thoracic Surgery (Cardiothoracic Vascular Surgery)

## 2017-01-13 LAB — CBC
HCT: 39.4 % (ref 39.0–52.0)
Hemoglobin: 13.2 g/dL (ref 13.0–17.0)
MCH: 31.7 pg (ref 26.0–34.0)
MCHC: 33.5 g/dL (ref 30.0–36.0)
MCV: 94.7 fL (ref 78.0–100.0)
PLATELETS: 186 10*3/uL (ref 150–400)
RBC: 4.16 MIL/uL — ABNORMAL LOW (ref 4.22–5.81)
RDW: 13.5 % (ref 11.5–15.5)
WBC: 12.6 10*3/uL — AB (ref 4.0–10.5)

## 2017-01-13 LAB — BLOOD GAS, ARTERIAL
ACID-BASE DEFICIT: 0.5 mmol/L (ref 0.0–2.0)
BICARBONATE: 23.6 mmol/L (ref 20.0–28.0)
DRAWN BY: 252031
O2 Content: 2 L/min
O2 Saturation: 98.6 %
PH ART: 7.401 (ref 7.350–7.450)
PO2 ART: 127 mmHg — AB (ref 83.0–108.0)
Patient temperature: 98.6
pCO2 arterial: 38.9 mmHg (ref 32.0–48.0)

## 2017-01-13 LAB — BASIC METABOLIC PANEL
Anion gap: 7 (ref 5–15)
BUN: 10 mg/dL (ref 6–20)
CHLORIDE: 108 mmol/L (ref 101–111)
CO2: 21 mmol/L — ABNORMAL LOW (ref 22–32)
CREATININE: 0.68 mg/dL (ref 0.61–1.24)
Calcium: 7.2 mg/dL — ABNORMAL LOW (ref 8.9–10.3)
GFR calc Af Amer: >60 mL/min (ref >60)
GFR calc non Af Amer: >60 mL/min (ref >60)
GLUCOSE: 127 mg/dL — AB (ref 65–99)
Potassium: 3.8 mmol/L (ref 3.5–5.1)
SODIUM: 136 mmol/L (ref 135–145)

## 2017-01-13 MED ORDER — WARFARIN - PHYSICIAN DOSING INPATIENT: Freq: Every day | Status: DC

## 2017-01-13 MED ORDER — AMIODARONE IV BOLUS ONLY 150 MG/100ML: 150.0000 mg | Freq: Once | INTRAVENOUS | Status: DC

## 2017-01-13 MED ORDER — LEVALBUTEROL HCL 0.63 MG/3ML IN NEBU: 0.6300 mg | INHALATION_SOLUTION | Freq: Four times a day (QID) | RESPIRATORY_TRACT | Status: DC | PRN

## 2017-01-13 MED ORDER — WARFARIN SODIUM 5 MG PO TABS
5.0000 mg | ORAL_TABLET | Freq: Once | ORAL | Status: AC
  Administered 2017-01-13: 5 mg via ORAL
  Filled 2017-01-13: qty 1

## 2017-01-13 NOTE — Progress Notes (Signed)
1 Day Post-Op Procedure(s) (LRB): VIDEO ASSISTED THORACOSCOPY (Right) RIGHT SUPERIOR SEGMENTECTOMY (Right) Subjective: No complaints. Denies pain and nausea  Objective: Vital signs in last 24 hours: Temp:  [97 F (36.1 C)-98.9 F (37.2 C)] 98.5 F (36.9 C) (01/25 0716) Pulse Rate:  [68-93] 68 (01/25 0800) Cardiac Rhythm: Normal sinus rhythm (01/25 0756) Resp:  [9-25] 11 (01/25 0800) BP: (86-174)/(59-109) 118/77 (01/25 0800) SpO2:  [93 %-100 %] 95 % (01/25 0800) Arterial Line BP: (74-164)/(62-107) 111/107 (01/25 0800) Weight:  [219 lb 12.8 oz (99.7 kg)] 219 lb 12.8 oz (99.7 kg) (01/25 0600)  Hemodynamic parameters for last 24 hours:    Intake/Output from previous day: 01/24 0701 - 01/25 0700 In: 4070 [P.O.:1320; I.V.:2700; IV Piggyback:50] Out: 1175 [Urine:855; Blood:100; Chest Tube:220] Intake/Output this shift: Total I/O In: 100 [I.V.:100] Out: -   General appearance: alert, cooperative and no distress Neurologic: intact Heart: regular rate and rhythm Lungs: clear to auscultation bilaterally Abdomen: normal findings: soft, non-tender no air leak  Lab Results:  Recent Labs  01/13/17 0345  WBC 12.6*  HGB 13.2  HCT 39.4  PLT 186   BMET:  Recent Labs  01/13/17 0345  NA 136  K 3.8  CL 108  CO2 21*  GLUCOSE 127*  BUN 10  CREATININE 0.68  CALCIUM 7.2*    PT/INR:  Recent Labs  01/12/17 1137  LABPROT 12.8  INR 0.96   ABG    Component Value Date/Time   PHART 7.401 01/13/2017 0355   HCO3 23.6 01/13/2017 0355   ACIDBASEDEF 0.5 01/13/2017 0355   O2SAT 98.6 01/13/2017 0355   CBG (last 3)  No results for input(s): GLUCAP in the last 72 hours.  Assessment/Plan: S/P Procedure(s) (LRB): VIDEO ASSISTED THORACOSCOPY (Right) RIGHT SUPERIOR SEGMENTECTOMY (Right) Plan for transfer to step-down: see transfer orders  Doing well POD # 1 CV- stable, dc A line RESP_ x/p segmentectomy, previous LUL- IS  CXR looks good RENAL- creatinine normal ENDO- CBG  OK DVT prophylaxs- SCD + enoxaparin   LOS: 1 day    Melrose Nakayama 01/13/2017

## 2017-01-13 NOTE — Care Management Note (Signed)
Case Management Note  Patient Details  Name: [name] MRN: [id number] Date of Birth: [birth year]  Subjective/Objective:      Pt is s/p VATS             Action/Plan:  PTA independent from home with wife.  CM will continue to follow for discharge needs   Expected Discharge Date:                  Expected Discharge Plan:  Home/Self Care  In-House Referral:     Discharge planning Services  CM Consult  Post Acute Care Choice:    Choice offered to:     DME Arranged:    DME Agency:     HH Arranged:    HH Agency:     Status of Service:  In process, will continue to follow  If discussed at Long Length of Stay Meetings, dates discussed:    Additional Comments:  Maryclare Labrador, RN 01/13/2017, 11:11 AM

## 2017-01-13 NOTE — Plan of Care (Signed)
Report to 4E RN

## 2017-01-13 NOTE — Plan of Care (Signed)
Transferred to 4E04 via [place], monitor, pt placed inn bed, CT to [place], scd'S WITH PT. rn TO RECEIVE IN ROOM.

## 2017-01-13 NOTE — Progress Notes (Addendum)
Pt On -Q pain pump was leaking. Bed and chest tube dressing soiled and required bed and dressing change. Visible leakage at insertion site. Per previous shift RN this was the most it had leaked. MD Bartle paged. MD advised to pull pain pump. Pt tolerated removal well, skin clean, dry and intact. Line intact after removal. Will continue to monitor.

## 2017-01-13 NOTE — Op Note (Signed)
NAME:  [name], COLLER NO.:  [id number]  MEDICAL RECORD NO.:  [id number]  LOCATION:                                 FACILITY:  PHYSICIAN:  Revonda Standard. Roxan Hockey, M.D. DATE OF BIRTH:  DATE OF PROCEDURE:  01/12/2017 DATE OF DISCHARGE:                              OPERATIVE REPORT   PREOPERATIVE DIAGNOSIS:  Right lower lobe lung nodule.  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  PROCEDURE:   Right video-assisted thoracoscopy Thoracoscopic right lower lobe superior segmentectomy Lymph node dissection Insertion of On-Q local anesthetic catheter.  SURGEON:  Revonda Standard. Roxan Hockey, M.D.  ASSISTANT:  Ellwood Handler, PA-C and Brigid Re, PA student.  ANESTHESIA:  General.  FINDINGS:  Palpable nodule in the superior segment of right lower lobe. Frozen section revealed well-differentiated adenocarcinoma, bronchial margin negative for tumor.  CLINICAL NOTE:  [name] is a 71 year old former smoker, who had a left upper lobectomy for an adenocarcinoma in April 2005.  He was treated with adjuvant chemotherapy and radiation and completed all the treatments in November 2005.  Recently, he had a CT scan which showed a 3 cm ground-glass opacity in the superior segment of the right lower lobe.  A followup was done at 3 months to see if the area had cleared. The area was still present.  There was no significant metabolic activity on PET-CT, but the patient was made aware that this could be a low-grade adenocarcinoma.  The options of observation, biopsy, and surgical resection were discussed in detail with the patient.  He wished to proceed with surgical resection.  The indications, risks, benefits, and alternatives were discussed in detail.  He understood and accepted the risks and agreed to proceed.  DESCRIPTION OF PROCEDURE:  [name] was brought to the preoperative holding area on January 12, 2017.  Anesthesia placed a  central line and arterial blood pressure monitoring line.  He was taken to the operating room, anesthetized, and intubated with a double-lumen endotracheal tube. Intravenous antibiotics were administered.  A Foley catheter was placed. Sequential compression devices were placed on the calves for DVT prophylaxis.  He was placed in a left lateral decubitus position and the right chest was prepped and draped in usual sterile fashion.  Single lung ventilation of the left lung was initiated and was tolerated well throughout the procedure.  An incision was made in the seventh interspace in the midaxillary line. A 5 mm port was inserted and the thoracoscope was advanced into the chest.  There was good isolation of the right lung.  A 5 cm working incision was made in the fourth interspace anterolaterally.  No rib spreading was performed during the procedure.  There were no pleural effusion and no abnormality of the parietal pleura.  The nodule was visible and palpable in the superior segment of the right lower lobe. The inferior ligament was divided with electrocautery.  No lymph node was identified.  The pleural reflection was divided at the hilum posteriorly and level 7 node was identified.  All lymph nodes that were encountered were removed and sent as separate specimens.  Attention then was turned to the fissure.  The  fissure was complete posteriorly, but more anteriorly the fissure was incomplete.  The major fissure between the middle lobe and lower lobe was divided with sequential firings of endoscopic GIA stapler.  There was a bridge of lung overlying the main pulmonary artery and this was divided as well.  The main PA now was visible.  The superior segmental branch was identified.  This was a relatively large branch.  It was dissected out.  There was an adherent lymph node that was very difficult to remove, but was gradually dissected off.  The superior segmental branch was encircled and  divided with endoscopic vascular stapler.  Additional nodes were present along the superior segmental bronchus, these again were adherent and difficult to takeoff of the bronchus, but ultimately the bronchus was encircled. A 45 mm stapler with a green cartridge was placed across the bronchus and closed.  A test inflation showed good aeration of the upper and middle lobes and the basilar segments of the lower lobe.  The stapler was fired transecting the bronchus.  The segmentectomy then was completed with sequential firings of the Echelon powered 45 mm stapler with gold cartridges.  The specimen was placed into an endoscopic retrieval bag, removed through the working incision and sent for frozen section of the mass and margins.  While awaiting that result, an On-Q local anesthetic catheter was tunneled into a subpleural location and primed with 5 mL of 0.5% Marcaine.  The frozen section returned showing a well-differentiated adenocarcinoma and a clear bronchial margin.  The chest was copiously irrigated with warm saline.  A test inflation to 30 cm of water revealed no air leakage.  A 28-French chest tube was placed through the original port incision and secured to skin with #1 silk suture.  The right lung was reinflated.  There was good aeration of all the remainder of the right lung.  A working incision was closed with a #1 Vicryl fascial suture.  The subcutaneous tissue and skin were closed in standard fashion.  The chest tube was placed to suction.  The patient was placed back in supine position.  He was extubated in the operating room and taken to the postanesthetic care unit in good condition.     Revonda Standard Roxan Hockey, M.D.     SCH/MEDQ  D:  01/12/2017  T:  01/13/2017  Job:  [id number]

## 2017-01-14 ENCOUNTER — Inpatient Hospital Stay (HOSPITAL_COMMUNITY): Payer: Medicare Other

## 2017-01-14 LAB — COMPREHENSIVE METABOLIC PANEL
ALBUMIN: 2.8 g/dL — AB (ref 3.5–5.0)
ALK PHOS: 63 U/L (ref 38–126)
ALT: 22 U/L (ref 17–63)
ANION GAP: 6 (ref 5–15)
AST: 18 U/L (ref 15–41)
BILIRUBIN TOTAL: 0.5 mg/dL (ref 0.3–1.2)
BUN: 9 mg/dL (ref 6–20)
CALCIUM: 7.9 mg/dL — AB (ref 8.9–10.3)
CO2: 26 mmol/L (ref 22–32)
Chloride: 106 mmol/L (ref 101–111)
Creatinine, Ser: 0.67 mg/dL (ref 0.61–1.24)
GLUCOSE: 93 mg/dL (ref 65–99)
Potassium: 3.6 mmol/L (ref 3.5–5.1)
Sodium: 138 mmol/L (ref 135–145)
TOTAL PROTEIN: 5.4 g/dL — AB (ref 6.5–8.1)

## 2017-01-14 LAB — PROTIME-INR
INR: 1.01
Prothrombin Time: 13.3 seconds (ref 11.4–15.2)

## 2017-01-14 LAB — CBC
HEMATOCRIT: 37.3 % — AB (ref 39.0–52.0)
HEMOGLOBIN: 12.3 g/dL — AB (ref 13.0–17.0)
MCH: 31.9 pg (ref 26.0–34.0)
MCHC: 33 g/dL (ref 30.0–36.0)
MCV: 96.6 fL (ref 78.0–100.0)
Platelets: 171 10*3/uL (ref 150–400)
RBC: 3.86 MIL/uL — ABNORMAL LOW (ref 4.22–5.81)
RDW: 13.8 % (ref 11.5–15.5)
WBC: 9.3 10*3/uL (ref 4.0–10.5)

## 2017-01-14 MED ORDER — WARFARIN SODIUM 5 MG PO TABS
5.0000 mg | ORAL_TABLET | ORAL | Status: DC
  Administered 2017-01-15: 5 mg via ORAL
  Filled 2017-01-14: qty 1

## 2017-01-14 MED ORDER — WARFARIN SODIUM 5 MG PO TABS
7.5000 mg | ORAL_TABLET | ORAL | Status: DC
  Administered 2017-01-14: 7.5 mg via ORAL
  Filled 2017-01-14: qty 2

## 2017-01-14 NOTE — Discharge Instructions (Signed)
Information on my medicine - Coumadin   (Warfarin)  This medication education was reviewed with me or my healthcare representative as part of my discharge preparation.  The pharmacist that spoke with me during my hospital stay was:  Duayne Cal, [facility]  Why was Coumadin prescribed for you? Coumadin was prescribed for you because you have a blood clot or a medical condition that can cause an increased risk of forming blood clots. Blood clots can cause serious health problems by blocking the flow of blood to the heart, lung, or brain. Coumadin can prevent harmful blood clots from forming. As a reminder your indication for Coumadin is:   Select from menu  What test will check on my response to Coumadin? While on Coumadin (warfarin) you will need to have an INR test regularly to ensure that your dose is keeping you in the desired range. The INR (international normalized ratio) number is calculated from the result of the laboratory test called prothrombin time (PT).  If an INR APPOINTMENT HAS NOT ALREADY BEEN MADE FOR YOU please schedule an appointment to have this lab work done by your health care provider within 7 days. Your INR goal is usually a number between:  2 to 3 or your provider may give you a more narrow range like 2-2.5.  Ask your health care provider during an office visit what your goal INR is.  What  do you need to  know  About  COUMADIN? Take Coumadin (warfarin) exactly as prescribed by your healthcare provider about the same time each day.  DO NOT stop taking without talking to the doctor who prescribed the medication.  Stopping without other blood clot prevention medication to take the place of Coumadin may increase your risk of developing a new clot or stroke.  Get refills before you run out.  What do you do if you miss a dose? If you miss a dose, take it as soon as you remember on the same day then continue your regularly scheduled regimen the next day.  Do not take two doses of  Coumadin at the same time.  Important Safety Information A possible side effect of Coumadin (Warfarin) is an increased risk of bleeding. You should call your healthcare provider right away if you experience any of the following: ? Bleeding from an injury or your nose that does not stop. ? Unusual colored urine (red or dark brown) or unusual colored stools (red or black). ? Unusual bruising for unknown reasons. ? A serious fall or if you hit your head (even if there is no bleeding).  Some foods or medicines interact with Coumadin (warfarin) and might alter your response to warfarin. To help avoid this: ? Eat a balanced diet, maintaining a consistent amount of Vitamin K. ? Notify your provider about major diet changes you plan to make. ? Avoid alcohol or limit your intake to 1 drink for women and 2 drinks for men per day. (1 drink is 5 oz. wine, 12 oz. beer, or 1.5 oz. liquor.)  Make sure that ANY health care provider who prescribes medication for you knows that you are taking Coumadin (warfarin).  Also make sure the healthcare provider who is monitoring your Coumadin knows when you have started a new medication including herbals and non-prescription products.  Coumadin (Warfarin)  Major Drug Interactions  Increased Warfarin Effect Decreased Warfarin Effect  Alcohol (large quantities) Antibiotics (esp. Septra/Bactrim, Flagyl, Cipro) Amiodarone (Cordarone) Aspirin (ASA) Cimetidine (Tagamet) Megestrol (Megace) NSAIDs (ibuprofen, naproxen, etc.) Piroxicam (  Feldene) Propafenone (Rythmol SR) Propranolol (Inderal) Isoniazid (INH) Posaconazole (Noxafil) Barbiturates (Phenobarbital) Carbamazepine (Tegretol) Chlordiazepoxide (Librium) Cholestyramine (Questran) Griseofulvin Oral Contraceptives Rifampin Sucralfate (Carafate) Vitamin K   Coumadin (Warfarin) Major Herbal Interactions  Increased Warfarin Effect Decreased Warfarin Effect  Garlic Ginseng Ginkgo biloba Coenzyme Q10 Green  tea St. Johns wort    Coumadin (Warfarin) FOOD Interactions  Eat a consistent number of servings per week of foods HIGH in Vitamin K (1 serving =  cup)  Collards (cooked, or boiled & drained) Kale (cooked, or boiled & drained) Mustard greens (cooked, or boiled & drained) Parsley *serving size only =  cup Spinach (cooked, or boiled & drained) Swiss chard (cooked, or boiled & drained) Turnip greens (cooked, or boiled & drained)  Eat a consistent number of servings per week of foods MEDIUM-HIGH in Vitamin K (1 serving = 1 cup)  Asparagus (cooked, or boiled & drained) Broccoli (cooked, boiled & drained, or raw & chopped) Brussel sprouts (cooked, or boiled & drained) *serving size only =  cup Lettuce, raw (green leaf, endive, romaine) Spinach, raw Turnip greens, raw & chopped   These websites have more information on Coumadin (warfarin):  FailFactory.se; VeganReport.com.au;  Video-Assisted Thoracic Surgery, Care After Refer to this sheet in the next few weeks. These instructions provide you with information on caring for yourself after your procedure. Your caregiver may also give you more specific instructions. Your procedure has been planned according to current medical practices, but problems sometimes occur. Call your caregiver if you have any problems or questions after your procedure. HOME CARE INSTRUCTIONS   Only take over-the-counter or prescription medications as directed.  Only take pain medications (narcotics) as directed.  Do not drive until your caregiver approves. Driving while taking narcotics or soon after surgery can be dangerous, so discuss the specific timing with your caregiver.  Avoid activities that use your chest muscles, such as lifting heavy objects, for at least 3-4 weeks.   Take deep breaths to expand the lungs and to protect against pneumonia.  Do breathing exercises as directed by your caregiver. If you were given an incentive  spirometer to help with breathing, use it as directed.  You may resume a normal diet and activities when you feel you are able to or as directed.  Do not take a bath until your caregiver says it is OK. Use the shower instead.   Keep the bandage (dressing) covering the area where the chest tube was inserted (incision site) dry for 48 hours. After 48 hours, remove the dressing unless there is new drainage.  Remove dressings as directed by your caregiver.  Change dressings if necessary or as directed.  Keep all follow-up appointments. It is important for you to see your caregiver after surgery to discuss appropriate follow-up care and surveillance, if it is necessary. SEEK MEDICAL CARE:  You feel excessive or increasing pain at an incision site.  You notice bleeding, skin irritation, drainage, swelling, or redness at an incision site.  There is a bad smell coming from an incision or dressing.  It feels like your heart is fluttering or beating rapidly.  Your pain medication does not relieve your pain. SEEK IMMEDIATE MEDICAL CARE IF:   You have a fever.   You have chest pain.  You have a rash.  You have shortness of breath.  You have trouble breathing.   You feel weak, lightheaded, dizzy, or faint.  MAKE SURE YOU:   Understand these instructions.   Will watch your condition.  Will get help right away if you are not doing well or get worse. This information is not intended to replace advice given to you by your health care provider. Make sure you discuss any questions you have with your health care provider. Document Released: 04/02/2013 Document Revised: 12/27/2014 Document Reviewed: 04/02/2013 Elsevier Interactive Patient Education  2017 [employer].

## 2017-01-14 NOTE — Discharge Summary (Signed)
Physician Discharge Summary  Patient ID: [name] MRN: [id number] DOB/AGE: July 27, 1946 71 y.o.  Admit date: 01/12/2017 Discharge date: 01/15/2017  Admission Diagnoses:  Patient Active Problem List   Diagnosis Date Noted  . Nodule of right lung 12/28/2016  . COPD GOLD II  10/07/2016  . Pulmonary infiltrate 10/04/2016  . 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 Near syncope 03/19/2015  . Hypertension 03/19/2015  . Hyperlipidemia 03/19/2015  . Warfarin-induced coagulopathy ([place]) 03/19/2015  . Hereditary and idiopathic peripheral neuropathy 09/26/2014   Discharge Diagnoses:   Adenocarcinoma right lower lobe- Clinical and Pathologic stage IA  Patient Active Problem List   Diagnosis Date Noted  . S/P partial lobectomy of lung 01/12/2017  . Nodule of right lung 12/28/2016  . COPD GOLD II  10/07/2016  . Pulmonary infiltrate 10/04/2016  . Alcohol withdrawal ([place]) 03/19/2015  . Near syncope 03/19/2015  . Hypertension 03/19/2015  . Hyperlipidemia 03/19/2015  . Warfarin-induced coagulopathy ([place]) 03/19/2015  . Hereditary and idiopathic peripheral neuropathy 09/26/2014   Discharged Condition: good  History of Present Illness:  [name] is a 71 yo male with history of tobacco abuse, lung cancer, peripheral neuropathy, HTN, Hyperlipidemia, ischemic colitis, H/O polio, and glaucoma.  His lung cancer was diagnosed in April 2005 and was T3N0 Adenocarcinoma.  This was treated with LUL, adjuvant chemotherapy and radiation.  He was followed by Dr. Roxan Hockey for 5 years without evidence of recurrence.  He has been undergoing routine yearly CT scans, with most recent showed a 3 cm ground glass opacity in the superior segment of the right lower lobe.  He was evaluated by Dr. Melvyn Novas who was concerned this could be a new lung cancer.  After review of the films it was felt to monitor the patient and repeat a PET/CT scan to assess for metabolic activity.  PET/CT was performed and showed the lesion  was still present, but there was no metabolic activity associated with it.  Dr. Roxan Hockey evaluated the patient 3 treatment options, but ultimately the patient wanted to proceed with surgical treatment.  The risks and benefits of the procedure were explained to the patient and he was agreeable to proceed.   Hospital Course:   Mr. Shaler presented to [facility] on 01/12/2017.  He underwent Right VATS with Right Lower Lobe Superior Segmentectomy, Lymph node dissection, and insertion of On-Q anesthetic catheter.  He tolerated the procedure without difficulty, was extubated and taken to the PACU in stable condition.  The patient did well post operatively.  His chest tube did not exhibit evidence of air leak.  His CXR was free from pneumothorax and he was transferred to the step down unit on POD #1.  Follow up CXR remained stable and his chest tube was removed on POD #2.  The patient was restarted on his home regimen of coumadin.  His INR at discharge was 1.01.  He remains medically stable, is ambulating independently, and his pain is well controlled.  He is felt medically stable for discharge home today.      Significant Diagnostic Studies:   1. Lung, resection (segmental or lobe), right superior segment - ADENOCARCINOMA, WELL DIFFERENTIATED, SPANNING 1.8 CM. - TUMOR IS LIMITED TO LUNG PARENCHYMA. - RESECTION MARGINS ARE NEGATIVE. - SEE ONCOLOGY TABLE. 2. Lymph node, biopsy, 7 - ONE OF ONE LYMPH NODES NEGATIVE FOR CARCINOMA (0/1). 3. Lymph node, biopsy, 12 - ONE OF ONE LYMPH NODES NEGATIVE FOR CARCINOMA (0/1). 4. Lymph node, biopsy, 12 #2 - ONE OF ONE LYMPH  NODES NEGATIVE FOR CARCINOMA (0/1).  Treatments: surgery:   Right video-assisted thoracoscopy, thoracoscopic right lower lobe superior segmentectomy, lymph node dissection, insertion of On-Q local anesthetic catheter.  Disposition: 01-Home or Self Care   Discharge medications:   Allergies as of 01/15/2017      Reactions   No  Known Allergies       Medication List    STOP taking these medications   cephALEXin 500 MG capsule Commonly known as:  KEFLEX     TAKE these medications   atorvastatin 10 MG tablet Commonly known as:  LIPITOR Take 10 mg by mouth every evening.   carvedilol 3.125 MG tablet Commonly known as:  COREG Take 3.125 mg by mouth 2 (two) times daily with a meal.   ferrous sulfate 325 (65 FE) MG tablet Take 325 mg by mouth every evening.   latanoprost 0.005 % ophthalmic solution Commonly known as:  XALATAN Place 1 drop into both eyes at bedtime.   losartan 50 MG tablet Commonly known as:  COZAAR Take 50 mg by mouth daily.   multivitamin with minerals Tabs tablet Take 1 tablet by mouth daily.   oxyCODONE 5 MG immediate release tablet Commonly known as:  Oxy IR/ROXICODONE Take 1-2 tablets (5-10 mg total) by mouth every 6 (six) hours as needed for severe pain.   warfarin 5 MG tablet Commonly known as:  COUMADIN Take 1 tablet (5 mg total) by mouth as directed. What changed:  how much to take  when to take this  additional instructions      Follow-up Information    Melrose Nakayama, MD Follow up.   Specialty:  Cardiothoracic Surgery Contact information: [address] [place] [address] [phone]           Signed: John Giovanni 01/15/2017, 7:06 PM

## 2017-01-14 NOTE — Progress Notes (Signed)
2 Days Post-Op Procedure(s) (LRB): VIDEO ASSISTED THORACOSCOPY (Right) RIGHT SUPERIOR SEGMENTECTOMY (Right) Subjective: More pain this AM after On-Q removed due to leakage Hitting 1500 on incentive  Objective: Vital signs in last 24 hours: Temp:  [98.1 F (36.7 C)-98.7 F (37.1 C)] 98.7 F (37.1 C) (01/26 0728) Pulse Rate:  [64-82] 68 (01/26 0728) Cardiac Rhythm: Normal sinus rhythm (01/26 0728) Resp:  [11-22] 11 (01/26 0728) BP: (120-148)/(66-85) 131/66 (01/26 0728) SpO2:  [91 %-97 %] 97 % (01/26 0728)  Hemodynamic parameters for last 24 hours:    Intake/Output from previous day: 01/25 0701 - 01/26 0700 In: 5379 [P.O.:1140; I.V.:300; IV Piggyback:50] Out: 2635 [Urine:2475; Chest Tube:160] Intake/Output this shift: No intake/output data recorded.  General appearance: alert, cooperative and no distress Neurologic: intact Heart: regular rate and rhythm Lungs: clear to auscultation bilaterally Abdomen: normal findings: soft, non-tender Wound: clean and dry  Lab Results:  Recent Labs  01/13/17 0345 01/14/17 0426  WBC 12.6* 9.3  HGB 13.2 12.3*  HCT 39.4 37.3*  PLT 186 171   BMET:  Recent Labs  01/13/17 0345 01/14/17 0426  NA 136 138  K 3.8 3.6  CL 108 106  CO2 21* 26  GLUCOSE 127* 93  BUN 10 9  CREATININE 0.68 0.67  CALCIUM 7.2* 7.9*    PT/INR:  Recent Labs  01/14/17 0426  LABPROT 13.3  INR 1.01   ABG    Component Value Date/Time   PHART 7.401 01/13/2017 0355   HCO3 23.6 01/13/2017 0355   ACIDBASEDEF 0.5 01/13/2017 0355   O2SAT 98.6 01/13/2017 0355   CBG (last 3)  No results for input(s): GLUCAP in the last 72 hours.  Assessment/Plan: S/P Procedure(s) (LRB): VIDEO ASSISTED THORACOSCOPY (Right) RIGHT SUPERIOR SEGMENTECTOMY (Right) POD # 2  CV- stable RESP- no air leak and minimal drainage- dc chest tube  Continue IS RENAL- creatinine and lytes OK  Dc Foley ENDO_ glucose normal GI- tolerating POs DVT prophylaxis- SCD + enoxaparin  until INR > 2  Resume home dose of coumadin- check INR daily Path pending   LOS: 2 days    Melrose Nakayama 01/14/2017

## 2017-01-14 NOTE — Care Management Note (Signed)
Case Management Note  Patient Details  Name: MIHAILO SAGE MRN: [id number] Date of Birth: May 22, 1946  Subjective/Objective:   S/p VATS, right superior segmentectomy pod 2, dc chest tube, tolerating po's,, lovenox until inr >2, resume home dose of coumadin, check inr daily, possible for dc tomorrow.  NCM will cont to follow for dc needs.                  Action/Plan:   Expected Discharge Date:                  Expected Discharge Plan:  Home/Self Care  In-House Referral:     Discharge planning Services  CM Consult  Post Acute Care Choice:    Choice offered to:     DME Arranged:    DME Agency:     HH Arranged:    HH Agency:     Status of Service:  Completed, signed off  If discussed at [employer] of Stay Meetings, dates discussed:    Additional Comments:  Zenon Mayo, RN 01/14/2017, 3:20 PM

## 2017-01-15 ENCOUNTER — Inpatient Hospital Stay (HOSPITAL_COMMUNITY): Payer: Medicare Other

## 2017-01-15 LAB — PROTIME-INR
INR: 1.01
Prothrombin Time: 13.3 seconds (ref 11.4–15.2)

## 2017-01-15 LAB — TYPE AND SCREEN
Blood Product Expiration Date: 201802132359
Blood Product Expiration Date: 201802132359
ISSUE DATE / TIME: 201801241352
ISSUE DATE / TIME: 201801241352
Unit Type and Rh: 5100
Unit Type and Rh: 5100

## 2017-01-15 MED ORDER — OXYCODONE HCL 5 MG PO TABS: 5.0000 mg | ORAL_TABLET | Freq: Four times a day (QID) | ORAL | 0 refills | Status: DC | PRN

## 2017-01-15 NOTE — Progress Notes (Addendum)
[address]       [place] [address]             [phone]      3 Days Post-Op Procedure(s) (LRB): VIDEO ASSISTED THORACOSCOPY (Right) RIGHT SUPERIOR SEGMENTECTOMY (Right) Subjective: Feels pretty well overall  Objective: Vital signs in last 24 hours: Temp:  [98.4 F (36.9 C)-99.1 F (37.3 C)] 98.8 F (37.1 C) (01/27 1100) Pulse Rate:  [77-83] 81 (01/27 1138) Cardiac Rhythm: Normal sinus rhythm (01/27 1138) Resp:  [14-22] 16 (01/27 1209) BP: (118-155)/(54-85) 120/74 (01/27 1138) SpO2:  [92 %-99 %] 98 % (01/27 1209)  Hemodynamic parameters for last 24 hours:    Intake/Output from previous day: 01/26 0701 - 01/27 0700 In: 942 [P.O.:852; I.V.:90] Out: 2030 [Urine:1950; Chest Tube:80] Intake/Output this shift: Total I/O In: 800 [P.O.:720; I.V.:80] Out: -   General appearance: alert, cooperative and no distress Heart: regular rate and rhythm Lungs: clear Abdomen: soft, nontender Extremities: no edema Wound: incis healing well  Lab Results:  Recent Labs  01/13/17 0345 01/14/17 0426  WBC 12.6* 9.3  HGB 13.2 12.3*  HCT 39.4 37.3*  PLT 186 171   BMET:  Recent Labs  01/13/17 0345 01/14/17 0426  NA 136 138  K 3.8 3.6  CL 108 106  CO2 21* 26  GLUCOSE 127* 93  BUN 10 9  CREATININE 0.68 0.67  CALCIUM 7.2* 7.9*    PT/INR:  Recent Labs  01/15/17 0550  LABPROT 13.3  INR 1.01   ABG    Component Value Date/Time   PHART 7.401 01/13/2017 0355   HCO3 23.6 01/13/2017 0355   ACIDBASEDEF 0.5 01/13/2017 0355   O2SAT 98.6 01/13/2017 0355   CBG (last 3)  No results for input(s): GLUCAP in the last 72 hours.  Meds Scheduled Meds: . acetaminophen  1,000 mg Oral Q6H   Or  . acetaminophen (TYLENOL) oral liquid 160 mg/5 mL  1,000 mg Oral Q6H  . atorvastatin  10 mg Oral QPM  . bisacodyl  10 mg Oral Daily  . carvedilol  3.125 mg Oral BID WC  . enoxaparin (LOVENOX) injection  40 mg Subcutaneous Daily  . fentaNYL   Intravenous Q4H    . ferrous sulfate  325 mg Oral QPM  . latanoprost  1 drop Both Eyes QHS  . losartan  50 mg Oral Daily  . multivitamin with minerals  1 tablet Oral Daily  . senna-docusate  1 tablet Oral QHS  . warfarin  5 mg Oral Q T,Th,S,Su-1800  . warfarin  7.5 mg Oral Q M,W,F-1800  . Warfarin - Physician Dosing Inpatient   Does not apply q1800   Continuous Infusions: . sodium chloride 10 mL/hr at 01/15/17 1200  . bupivacaine 0.5 % ON-Q pump SINGLE CATH 400 mL Stopped (01/13/17 2025)  . bupivacaine ON-Q pain pump     PRN Meds:.diphenhydrAMINE **OR** diphenhydrAMINE, levalbuterol, naloxone **AND** sodium chloride flush, ondansetron (ZOFRAN) IV, ondansetron (ZOFRAN) IV, oxyCODONE, potassium chloride (KCL MULTIRUN) 30 mEq in 265 mL IVPB, traMADol  Xrays Dg Chest 2 View  Result Date: 01/15/2017 CLINICAL DATA:  Partial right lobectomy EXAM: CHEST  2 VIEW COMPARISON:  01/14/2017 FINDINGS: Right Central line remains in place, unchanged. No visible pneumothorax. Heart is upper limits normal in size. No confluent airspace opacities. Heart is normal size. No effusions. Old left rib fractures again noted, stable. IMPRESSION: No visible pneumothorax currently.  No active disease. Electronically Signed   By: Rolm Baptise M.D.  On: 01/15/2017 07:56   Dg Chest Port 1 View  Result Date: 01/14/2017 CLINICAL DATA:  Status post right partial lobectomy. EXAM: PORTABLE CHEST 1 VIEW COMPARISON:  Radiograph of same day. FINDINGS: Stable cardiomediastinal silhouette. Right-sided chest tube has been removed. Minimal right apical pneumothorax is noted. Old left rib fractures are noted. No pleural effusions are noted. Minimal right middle lobe subsegmental atelectasis is noted. Right internal jugular catheter is unchanged in position. IMPRESSION: Minimal right apical pneumothorax is noted status post right-sided chest tube removal. Electronically Signed   By: Marijo Conception, M.D.   On: 01/14/2017 10:53   Dg Chest Port 1  View  Result Date: 01/14/2017 CLINICAL DATA:  Status post partial lobectomy on the right EXAM: PORTABLE CHEST 1 VIEW COMPARISON:  01/13/2017 FINDINGS: Cardiac shadow is stable. Postsurgical changes are again noted on the left and stable. Left subclavian stenting is noted. Right jugular central line and right thoracostomy catheter are seen. No pneumothorax is noted. Minimal right basilar atelectasis is seen. Postsurgical changes in the right hilum are noted. IMPRESSION: Tubes and lines as described above. Postsurgical changes stable from the previous exam. Electronically Signed   By: Inez Catalina M.D.   On: 01/14/2017 09:12    Assessment/Plan: S/P Procedure(s) (LRB): VIDEO ASSISTED THORACOSCOPY (Right) RIGHT SUPERIOR SEGMENTECTOMY (Right)  1 doing well 2 d/c pca and central line today  3 hypertensive at times- cont current rx for now but mau need adjustments 4 routine pulm toilet and rehab 5 left arm swelling- has subclavian stent and appears to be a chronic problem, Hand warm /well perfused with + pulses 6 path adenocarcinoma, neg margin  LOS: 3 days    GOLD,WAYNE E 01/15/2017  Patient wants to go home today, continue home coumadin  Discussed path with patient , nodes negative  I have seen and examined [name] and agree with the above assessment  and plan.  Grace Isaac MD Beeper [phone] Office [phone] 01/15/2017 7:18 PM

## 2017-01-15 NOTE — Progress Notes (Signed)
Wasted 62m fentanyl in pca with BClyda Greener RConsuelo PandyRN

## 2017-01-17 ENCOUNTER — Ambulatory Visit (INDEPENDENT_AMBULATORY_CARE_PROVIDER_SITE_OTHER): Payer: Self-pay | Admitting: Physician Assistant

## 2017-01-17 VITALS — BP 126/79 | HR 100 | Temp 97.9°F | Resp 20 | Ht 69.0 in | Wt 219.0 lb

## 2017-01-17 DIAGNOSIS — C3491 Malignant neoplasm of unspecified part of right bronchus or lung: Secondary | ICD-10-CM

## 2017-01-17 MED ORDER — CEPHALEXIN 500 MG PO CAPS: 500.0000 mg | ORAL_CAPSULE | Freq: Three times a day (TID) | ORAL | 0 refills | Status: DC

## 2017-01-17 NOTE — Progress Notes (Signed)
  HPI:  Patient returns for routine postoperative follow-up having undergone a right VATS, thoracoscopic RLL superior segmentectomy, lymph node dissection, and On Q placement on 01/12/2017. Since hospital discharge the patient reports, he had a fever to 100 yesterday and his incision seems to be more painful.    Current Outpatient Prescriptions  Medication Sig Dispense Refill  . atorvastatin (LIPITOR) 10 MG tablet Take 10 mg by mouth every evening.     . carvedilol (COREG) 3.125 MG tablet Take 3.125 mg by mouth 2 (two) times daily with a meal.    . ferrous sulfate 325 (65 FE) MG tablet Take 325 mg by mouth every evening.     . latanoprost (XALATAN) 0.005 % ophthalmic solution Place 1 drop into both eyes at bedtime.    Marland Kitchen losartan (COZAAR) 50 MG tablet Take 50 mg by mouth daily.    . Multiple Vitamin (MULTIVITAMIN WITH MINERALS) TABS tablet Take 1 tablet by mouth daily.    Marland Kitchen oxyCODONE (OXY IR/ROXICODONE) 5 MG immediate release tablet Take 1-2 tablets (5-10 mg total) by mouth every 6 (six) hours as needed for severe pain. 30 tablet 0  . warfarin (COUMADIN) 5 MG tablet Take 1 tablet (5 mg total) by mouth as directed. (Patient taking differently: Take 5-7.5 mg by mouth See admin instructions. Monday, Wednesday, and Friday 7.5 mg (1.5 tablets) & 5 mg (1 tablet) all other days.) 125 tablet 4  Vital Signs:  Tem 97.9, HR 100, BP 126/79, and oxygenation 95 % on room air.   Physical Exam: CV- RRR Pulmonary-Clear to auscultation bilaterally Wound-Some erythema corner of wound, no drainage but is tender and warm  Impression and Plan: Mr. Haithcock may have early cellulitis around edge of right chest wound. I gave him a prescription for Keflex 500 mg tid for 5 days. He will return in one week for a wound check. If he develops fever, chills, increasing redness, or drainage he is to call office immediately.    Nani Skillern, PA-C [facility] [phone]

## 2017-01-25 ENCOUNTER — Ambulatory Visit: Payer: Medicare Other | Admitting: Thoracic Surgery (Cardiothoracic Vascular Surgery)

## 2017-01-31 ENCOUNTER — Other Ambulatory Visit: Payer: Self-pay | Admitting: Thoracic Surgery (Cardiothoracic Vascular Surgery)

## 2017-01-31 DIAGNOSIS — Z902 Acquired absence of lung [part of]: Secondary | ICD-10-CM

## 2017-02-01 ENCOUNTER — Encounter: Payer: Self-pay | Admitting: Thoracic Surgery (Cardiothoracic Vascular Surgery)

## 2017-02-01 ENCOUNTER — Ambulatory Visit (INDEPENDENT_AMBULATORY_CARE_PROVIDER_SITE_OTHER): Payer: Self-pay | Admitting: Thoracic Surgery (Cardiothoracic Vascular Surgery)

## 2017-02-01 ENCOUNTER — Ambulatory Visit
Admission: RE | Admit: 2017-02-01 | Discharge: 2017-02-01 | Disposition: A | Discharge status: Home / Self Care | Payer: Medicare Other | Source: Ambulatory Visit | Attending: Thoracic Surgery (Cardiothoracic Vascular Surgery) | Admitting: Thoracic Surgery (Cardiothoracic Vascular Surgery)

## 2017-02-01 VITALS — BP 138/86 | HR 100 | Resp 20 | Ht 69.0 in | Wt 219.0 lb

## 2017-02-01 DIAGNOSIS — Z902 Acquired absence of lung [part of]: Secondary | ICD-10-CM

## 2017-02-01 DIAGNOSIS — C3491 Malignant neoplasm of unspecified part of right bronchus or lung: Secondary | ICD-10-CM

## 2017-02-01 NOTE — Progress Notes (Signed)
      [address]       [place] [address]             [phone]       

## 2017-02-01 NOTE — Progress Notes (Signed)
[address]       [place] [address]             [phone]    HPI: [name] returns for a scheduled postoperative follow-up visit.  Mr. 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 He was being followed with annual low dose CT screening. In September he was noted to have a 3 cm groundglass opacity in the superior segment of the right lower lobe. A follow-up PET CT at 3 months showed the lesion was not hypermetabolic, but it was still present. After discussion he wanted to go ahead and have surgical resection.  He had a thoracoscopic right lower lobe superior segmentectomy on 01/12/2017. His postoperative course was unremarkable and he went home on day 3.  He has been feeling well. He has some incisional discomfort mostly when he lays down to go to sleep at night. He is not taking any narcotics for that and wants to know if it's okay if he uses Tylenol. He does not have any issues with his breathing. Past Medical History:  Diagnosis Date  . Cancer ([place])    lung cancer 2005  . Cancer ([place])    skin cancer, mouth cancer  . COPD (chronic obstructive pulmonary disease) ([place])   . Glaucoma   . Hereditary and idiopathic peripheral neuropathy 09/26/2014  . Hyperlipidemia   . Hypertension   . Ischemic colitis ([place])   . 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 Polio   . Sleep apnea    no CPAP     Current Outpatient Prescriptions  Medication Sig Dispense Refill  . atorvastatin (LIPITOR) 10 MG tablet Take 10 mg by mouth every evening.     . carvedilol (COREG) 3.125 MG tablet Take 3.125 mg by mouth 2 (two) times daily with a meal.    . ferrous sulfate 325 (65 FE) MG tablet Take 325 mg by mouth every evening.     . latanoprost (XALATAN) 0.005 % ophthalmic solution Place 1 drop into both eyes at bedtime.    Marland Kitchen  losartan (COZAAR) 50 MG tablet Take 50 mg by mouth daily.    . Multiple Vitamin (MULTIVITAMIN WITH MINERALS) TABS tablet Take 1 tablet by mouth daily.    Marland Kitchen warfarin (COUMADIN) 5 MG tablet Take 1 tablet (5 mg total) by mouth as directed. (Patient taking differently: Take 5-7.5 mg by mouth See admin instructions. Monday, Wednesday, and Friday 7.5 mg (1.5 tablets) & 5 mg (1 tablet) all other days.) 125 tablet 4   No current facility-administered medications for this visit.     Physical Exam BP 138/86   Pulse 100   Resp 20   Ht '5\' 9"'$  (1.753 m)   Wt 219 lb (99.3 kg)   SpO2 99% Comment: RA  BMI 32.98 kg/m  71 year old man in no acute distress Alert and oriented 3 with no focal deficits Lungs clear, slightly decreased at the left base Incisions healing well  Diagnostic Tests: CHEST  2 VIEW  COMPARISON:  PA and lateral chest 01/15/2017.  FINDINGS: Lungs are clear. Heart size is normal. Apical scar is more notable on the left. No pneumothorax or pleural fluid. Heart size is normal. Remote left rib fractures and vascular stent are noted.  IMPRESSION: No acute disease.   Electronically Signed   By:  Inge Rise M.D.   On: 02/01/2017 12:32 I personally reviewed the chest x-ray images   Impression: 71 year old gentleman who is now about 2-1/2 weeks out from a thoracoscopic right lower lobe superior segmentectomy for stage IA well-differentiated adenocarcinoma. He is doing exceptionally well at this point in time. He does still have some discomfort but is not requiring any medication for that. He may begin driving on a limited basis. Appropriate precautions were discussed. There is no restrictions on his physical activities, but he was cautioned to start light and build up gradually.  He had a previous lung cancer over a decade ago. This is not a recurrence but a second primary. He was previously followed by Dr. Eston Esters. He is no longer here so I will set up Mr. Eckard up to  see Dr. Julien Nordmann at the cancer center.  Plan: Refer to [place] [facility] to see Dr. Julien Nordmann Return in 2 months with PA and lateral chest x-ray  Melrose Nakayama, MD [facility] [phone]

## 2017-02-03 ENCOUNTER — Telehealth: Payer: Self-pay | Admitting: *Deleted

## 2017-02-03 DIAGNOSIS — R911 Solitary pulmonary nodule: Secondary | ICD-10-CM

## 2017-02-03 NOTE — Telephone Encounter (Signed)
Oncology Nurse Navigator Documentation  Oncology Nurse Navigator Flowsheets 02/03/2017  Navigator Location [facility]  Referral date to RadOnc/MedOnc 01/31/2017  Navigator Encounter Type Telephone/I received referral on Mr. Lindahl.  I updated Dr. Julien Nordmann on referral information.  I called Mr. Lozito wife per instructions from referring office and gave her the appt.  She verbalized understanding of appt time and place.   Telephone Outgoing Call  Treatment Phase Post-Tx Follow-up  Barriers/Navigation Needs Coordination of Care  Interventions Coordination of Care  Coordination of Care Appts  Acuity Level 1  Acuity Level 1 Initial guidance, education and coordination as needed  Time Spent with Patient 15

## 2017-02-03 NOTE — Telephone Encounter (Signed)
Juliann Pulse, patient's wife called.  She had to re-schedule Mr. Broner appt due to conflicts at work.  I gave her another appt time.  She was thankful for the help

## 2017-02-24 ENCOUNTER — Other Ambulatory Visit: Payer: Medicare Other

## 2017-02-24 ENCOUNTER — Ambulatory Visit: Payer: Medicare Other | Admitting: Internal Medicine

## 2017-03-02 ENCOUNTER — Telehealth: Payer: Self-pay | Admitting: *Deleted

## 2017-03-02 NOTE — Telephone Encounter (Signed)
Oncology Nurse Navigator Documentation  Oncology Nurse Navigator Flowsheets 03/02/2017  Navigator Location [facility]  Navigator Encounter Type Telephone/I called to remind patient about appt.  Verbalized understanding of appt time and place.  Telephone Outgoing Call  Treatment Phase Other  Barriers/Navigation Needs Education  Education Other  Interventions Education  Education Method Verbal  Acuity Level 1  Acuity Level 1 Minimal follow up required  Time Spent with Patient 15

## 2017-03-03 ENCOUNTER — Other Ambulatory Visit (HOSPITAL_BASED_OUTPATIENT_CLINIC_OR_DEPARTMENT_OTHER): Payer: Medicare Other

## 2017-03-03 ENCOUNTER — Ambulatory Visit: Payer: Medicare Other | Attending: Internal Medicine | Admitting: Physical Therapy

## 2017-03-03 ENCOUNTER — Telehealth: Payer: Self-pay | Admitting: Internal Medicine

## 2017-03-03 ENCOUNTER — Encounter: Payer: Self-pay | Admitting: Internal Medicine

## 2017-03-03 ENCOUNTER — Telehealth: Payer: Self-pay | Admitting: Medical Oncology

## 2017-03-03 ENCOUNTER — Ambulatory Visit (HOSPITAL_BASED_OUTPATIENT_CLINIC_OR_DEPARTMENT_OTHER): Payer: Medicare Other | Admitting: Internal Medicine

## 2017-03-03 ENCOUNTER — Encounter: Payer: Self-pay | Admitting: *Deleted

## 2017-03-03 DIAGNOSIS — R262 Difficulty in walking, not elsewhere classified: Secondary | ICD-10-CM | POA: Diagnosis present

## 2017-03-03 DIAGNOSIS — R293 Abnormal posture: Secondary | ICD-10-CM | POA: Diagnosis present

## 2017-03-03 DIAGNOSIS — C3431 Malignant neoplasm of lower lobe, right bronchus or lung: Secondary | ICD-10-CM | POA: Diagnosis not present

## 2017-03-03 DIAGNOSIS — Z85118 Personal history of other malignant neoplasm of bronchus and lung: Secondary | ICD-10-CM | POA: Diagnosis not present

## 2017-03-03 DIAGNOSIS — Z808 Family history of malignant neoplasm of other organs or systems: Secondary | ICD-10-CM

## 2017-03-03 DIAGNOSIS — Z800 Family history of malignant neoplasm of digestive organs: Secondary | ICD-10-CM | POA: Diagnosis not present

## 2017-03-03 DIAGNOSIS — I10 Essential (primary) hypertension: Secondary | ICD-10-CM | POA: Diagnosis not present

## 2017-03-03 DIAGNOSIS — R911 Solitary pulmonary nodule: Secondary | ICD-10-CM

## 2017-03-03 DIAGNOSIS — Z87891 Personal history of nicotine dependence: Secondary | ICD-10-CM

## 2017-03-03 DIAGNOSIS — C3491 Malignant neoplasm of unspecified part of right bronchus or lung: Secondary | ICD-10-CM

## 2017-03-03 DIAGNOSIS — R2681 Unsteadiness on feet: Secondary | ICD-10-CM | POA: Insufficient documentation

## 2017-03-03 DIAGNOSIS — J449 Chronic obstructive pulmonary disease, unspecified: Secondary | ICD-10-CM | POA: Diagnosis not present

## 2017-03-03 DIAGNOSIS — Z8051 Family history of malignant neoplasm of kidney: Secondary | ICD-10-CM | POA: Diagnosis not present

## 2017-03-03 HISTORY — DX: Malignant neoplasm of unspecified part of right bronchus or lung: C34.91

## 2017-03-03 LAB — COMPREHENSIVE METABOLIC PANEL
ALT: 30 U/L (ref 0–55)
AST: 23 U/L (ref 5–34)
Albumin: 4 g/dL (ref 3.5–5.0)
Alkaline Phosphatase: 115 U/L (ref 40–150)
Anion Gap: 10 mEq/L (ref 3–11)
BUN: 11.1 mg/dL (ref 7.0–26.0)
CO2: 26 meq/L (ref 22–29)
Calcium: 9.7 mg/dL (ref 8.4–10.4)
Chloride: 102 mEq/L (ref 98–109)
Creatinine: 0.8 mg/dL (ref 0.7–1.3)
GLUCOSE: 99 mg/dL (ref 70–140)
POTASSIUM: 4.6 meq/L (ref 3.5–5.1)
SODIUM: 138 meq/L (ref 136–145)
TOTAL PROTEIN: 7.4 g/dL (ref 6.4–8.3)
Total Bilirubin: 0.48 mg/dL (ref 0.20–1.20)

## 2017-03-03 LAB — CBC WITH DIFFERENTIAL/PLATELET
BASO%: 0.8 % (ref 0.0–2.0)
Basophils Absolute: 0.1 10*3/uL (ref 0.0–0.1)
EOS ABS: 0.3 10*3/uL (ref 0.0–0.5)
EOS%: 4.5 % (ref 0.0–7.0)
HCT: 45.6 % (ref 38.4–49.9)
HGB: 15.3 g/dL (ref 13.0–17.1)
LYMPH%: 30.3 % (ref 14.0–49.0)
MCH: 31 pg (ref 27.2–33.4)
MCHC: 33.6 g/dL (ref 32.0–36.0)
MCV: 92.5 fL (ref 79.3–98.0)
MONO#: 0.7 10*3/uL (ref 0.1–0.9)
MONO%: 9.8 % (ref 0.0–14.0)
NEUT#: 4.1 10*3/uL (ref 1.5–6.5)
NEUT%: 54.6 % (ref 39.0–75.0)
Platelets: 232 10*3/uL (ref 140–400)
RBC: 4.93 10*6/uL (ref 4.20–5.82)
RDW: 13.9 % (ref 11.0–14.6)
WBC: 7.6 10*3/uL (ref 4.0–10.3)
lymph#: 2.3 10*3/uL (ref 0.9–3.3)

## 2017-03-03 NOTE — Progress Notes (Signed)
[name] [phone]   [phone] Multidisciplinary thoracic oncology clinic  CONSULT NOTE  REFERRING PHYSICIAN: Dr. Modesto Everett  REASON FOR CONSULTATION:  71 years old white male recently diagnosed with lung cancer.  HPI [name] is a 71 y.o. male with past medical history significant for hypertension, dyslipidemia, COPD, peripheral neuropathy, poliomyelitis as child as well as ischemic colitis and long history of smoking but quit in 2005. The patient was diagnosed with a stage IIB (T3, N0, M0) non-small cell lung cancer, adenosquamous carcinoma status post left upper lobectomy with lymph node dissection under the care of Dr. Roxan Everett on 03/24/2004 with close resection margin and involvement of the pleura. The patient underwent adjuvant systemic chemotherapy as well as radiotherapy under the care of Dr. Truddie Everett at that time. The patient has been on observation since that time. He was followed by annual imaging studies. CT scan of the chest was performed on 09/17/2016 showed 3.1 x 2.2 cm groundglass opacity with central cavitation seen in the superior segment of the right lower lobe which appeared more prominent compared to prior exam. Adenocarcinoma cannot be excluded. This was followed by a PET scan on 12/17/2016 and it showed non-hypermetabolic groundglass 3.0 cm pulmonary nodule with central cystic changes in the superior segment of the right lower lobe. The nodules remain suspicious for indolent primary bronchogenic adenocarcinoma giving [name] October 2015. No hypermetabolic metastatic disease. The patient was referred to Dr. Roxan Everett and on 01/09/2014 he underwent a right VATS and right lower superior segmentectomy with lymph node dissection. The final pathology ([id number]) showed adenocarcinoma well-differentiated is spanning 1.8 cm was no evidence of lymphovascular invasion and the resection margins were negative for  malignancy. Dr. Roxan Everett kindly referred the patient to the multidisciplinary thoracic oncology clinic today for further evaluation and management of his condition. When seen today he is recovering well from his surgery. He continues to have mild cough and shortness of breath. He denied having any hemoptysis. He denied having any weight loss or night sweats. He has no nausea, vomiting, diarrhea or constipation. He has no headache or visual changes. Family history significant for mother died from kidney cancer at age 42, father died at age 101 from heart disease. The patient is married and has a daughter age 37. He was accompanied by his wife [name]. He is currently retired and used to work for [employer] He has a history of smoking more than one pack per day for around city 4 years and quit in 2005. She drinks a bottle of wine every night. He has no history of drug abuse.  HPI  Past Medical History:  Diagnosis Date  . Cancer ([place])    lung cancer 2005  . Cancer ([place])    skin cancer, mouth cancer  . COPD (chronic obstructive pulmonary disease) ([place])   . Glaucoma   . Hereditary and idiopathic peripheral neuropathy 09/26/2014  . Hyperlipidemia   . Hypertension   . Ischemic colitis ([place])   . Left subclavian vein thrombosis ([place])   . Polio   . Sleep apnea    no CPAP    Past Surgical History:  Procedure Laterality Date  . HERNIA REPAIR     left ingunal  . LUNG REMOVAL, PARTIAL Left   . SEGMENTECOMY Right 01/12/2017   Procedure: RIGHT SUPERIOR SEGMENTECTOMY;  Surgeon: Caleb Nakayama, MD;  Location: [facility];  Service: Thoracic;  Laterality: Right;  . SUBCLAVIAN STENT PLACEMENT    .  TONSILLECTOMY    . VIDEO ASSISTED THORACOSCOPY Right 01/12/2017   Procedure: VIDEO ASSISTED THORACOSCOPY;  Surgeon: Caleb Nakayama, MD;  Location: [facility];  Service: Thoracic;  Laterality: Right;    Family History  Problem Relation Age of Onset  . Cancer Mother     intestine  .  Hyperlipidemia Mother   . Heart Problems Brother   . Heart attack Brother 45    s/p CABG and redo 5 years later  . Heart attack Father   . Heart disease Father   . Colon cancer Maternal Grandmother   . Colon cancer Maternal Aunt     Social History Social History  Substance Use Topics  . Smoking status: Former Smoker    Packs/day: 2.00    Years: 40.00    Types: Cigarettes    Quit date: 2004  . Smokeless tobacco: Never Used  . Alcohol use 25.2 oz/week    42 Glasses of wine per week     Comment: occasionally- Vodka 4 to five drinks a night    Allergies  Allergen Reactions  . No Known Allergies     Current Outpatient Prescriptions  Medication Sig Dispense Refill  . atorvastatin (LIPITOR) 10 MG tablet Take 10 mg by mouth every evening.     . carvedilol (COREG) 3.125 MG tablet Take 3.125 mg by mouth 2 (two) times daily with a meal.    . ferrous sulfate 325 (65 FE) MG tablet Take 325 mg by mouth every evening.     . latanoprost (XALATAN) 0.005 % ophthalmic solution Place 1 drop into both eyes at bedtime.    Marland Kitchen losartan (COZAAR) 50 MG tablet Take 50 mg by mouth daily.    . Multiple Vitamin (MULTIVITAMIN WITH MINERALS) TABS tablet Take 1 tablet by mouth daily.    Marland Kitchen warfarin (COUMADIN) 5 MG tablet Take 1 tablet (5 mg total) by mouth as directed. (Patient taking differently: Take 5-7.5 mg by mouth See admin instructions. Monday, Wednesday, and Friday 7.5 mg (1.5 tablets) & 5 mg (1 tablet) all other days.) 125 tablet 4   No current facility-administered medications for this visit.     Review of Systems  Constitutional: negative Eyes: negative Ears, nose, mouth, throat, and face: negative Respiratory: positive for cough and dyspnea on exertion Cardiovascular: negative Gastrointestinal: negative Genitourinary:negative Integument/breast: negative Hematologic/lymphatic: negative Musculoskeletal:negative Neurological: negative Behavioral/Psych: negative Endocrine:  negative Allergic/Immunologic: negative  Physical Exam  UEA:VWUJW, healthy, no distress, well nourished and well developed SKIN: skin color, texture, turgor are normal, no rashes or significant lesions HEAD: Normocephalic, No masses, lesions, tenderness or abnormalities EYES: normal, PERRLA, Conjunctiva are pink and non-injected EARS: External ears normal, Canals clear OROPHARYNX:no exudate, no erythema and lips, buccal mucosa, and tongue normal  NECK: supple, no adenopathy, no JVD LYMPH:  no palpable lymphadenopathy, no hepatosplenomegaly LUNGS: clear to auscultation , and palpation HEART: regular rate & rhythm and no murmurs ABDOMEN:abdomen soft, non-tender, normal bowel sounds and no masses or organomegaly BACK: Back symmetric, no curvature., No CVA tenderness EXTREMITIES:no joint deformities, effusion, or inflammation, no edema, no skin discoloration  NEURO: alert & oriented x 3 with fluent speech, no focal motor/sensory deficits  PERFORMANCE STATUS: ECOG 1  LABORATORY DATA: Lab Results  Component Value Date   WBC 7.6 03/03/2017   HGB 15.3 03/03/2017   HCT 45.6 03/03/2017   MCV 92.5 03/03/2017   PLT 232 03/03/2017      Chemistry      Component Value Date/Time   NA 138 03/03/2017  1244   K 4.6 03/03/2017 1244   CL 106 01/14/2017 0426   CO2 26 03/03/2017 1244   BUN 11.1 03/03/2017 1244   CREATININE 0.8 03/03/2017 1244      Component Value Date/Time   CALCIUM 9.7 03/03/2017 1244   ALKPHOS 115 03/03/2017 1244   AST 23 03/03/2017 1244   ALT 30 03/03/2017 1244   BILITOT 0.48 03/03/2017 1244       RADIOGRAPHIC STUDIES: No results found.  ASSESSMENT: This is a very pleasant 71 years old white male recently diagnosed with a stage IA (T1a, N0, M0) non-small cell lung cancer, adenocarcinoma diagnosed in January 2018 status post right lower lobe superior segmentectomy with lymph node dissection under the care of Dr. Roxan Everett.   PLAN: I had a lengthy discussion with  the patient and his wife today about his current disease is stage, prognosis and treatment options. I explained to the patient that the 5 year survival for patient with a stage IA is in the average of 80%. I also explained to the patient that there is no survival benefit for adjuvant systemic chemotherapy or radiation for patient with a stage IA non-small cell lung cancer. I recommended for the patient to continue on observation with repeat CT scan of the chest in 6 months for restaging of his disease. For hypertension, the patient will continue his current treatment with Cecilie Lowers and Cozaar. The patient was seen during the multidisciplinary thoracic oncology clinic today by medical oncology, thoracic navigator, social worker and physical therapist. He was advised to call immediately if he has any concerning symptoms in the interval. The patient voices understanding of current disease status and treatment options and is in agreement with the current care plan.  All questions were answered. The patient knows to call the clinic with any problems, questions or concerns. We can certainly see the patient much sooner if necessary.  Thank you so much for allowing me to participate in the care of [name]. I will continue to follow up the patient with you and assist in his care.  I spent 40 minutes counseling the patient face to face. The total time spent in the appointment was 60 minutes.  Disclaimer: This note was dictated with voice recognition software. Similar sounding words can inadvertently be transcribed and may not be corrected upon review.   Davidjames Blansett K. March 03, 2017, 1:43 PM

## 2017-03-03 NOTE — Telephone Encounter (Signed)
Gave patient AVS and calender per 03/03/2017 los. [facility] to contact patient with CT schedule.

## 2017-03-03 NOTE — Progress Notes (Signed)
[facility] Clinical Social Work  Clinical Social Work met with patient/family at [employer] appointment to offer support and assess for psychosocial needs.  Patient shared he was informed he is on observation and has no need for treatment at this time.  [name] reported no concerns and scored a 1 on the distress thermometer.    Clinical Social Work briefly discussed Clinical Social Work role and [employer] support programs/services.  Clinical Social Work encouraged patient to call with any additional questions or concerns.   [name], MSW, LCSW, OSW-C Clinical Social Worker [facility] [phone]
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
## 2017-03-03 NOTE — Telephone Encounter (Signed)
Returned pt call after he left message saying he is returning a call from the cancer center. I left message that I do not know who called pt

## 2017-03-04 ENCOUNTER — Encounter: Payer: Self-pay | Admitting: *Deleted

## 2017-03-04 NOTE — Progress Notes (Signed)
Oncology Nurse Navigator Documentation  Oncology Nurse Navigator Flowsheets 03/04/2017  Navigator Location [facility]  Navigator Encounter Type Clinic/MDC/I spoke with patient and wife yesterday at [facility]  I gave and explained information on lung cancer and resources at the cancer center.  He is on observation after his surgery.  No system treatment is needed.    Abnormal Finding Date 09/17/2016  Confirmed Diagnosis Date 01/12/2017  Surgery Date 01/12/2017  Multidisiplinary Clinic Date 03/03/2017  Treatment Initiated Date 01/12/2017  Patient Visit Type MedOnc  Treatment Phase Other  Barriers/Navigation Needs Education  Education Other  Interventions Education  Education Method Verbal;Written  Support Groups/Services [employer];Other  Acuity Level 2  Acuity Level 2 Educational needs;Other  Time Spent with Patient 30

## 2017-03-05 ENCOUNTER — Encounter: Payer: Self-pay | Admitting: Internal Medicine

## 2017-03-11 ENCOUNTER — Telehealth: Payer: Self-pay | Admitting: *Deleted

## 2017-03-11 NOTE — Telephone Encounter (Signed)
Oncology Nurse Navigator Documentation  Oncology Nurse Navigator Flowsheets 03/11/2017  Navigator Location [facility]  Navigator Encounter Type Telephone/I followed up with PT yesterday regarding [name].  She thinks he is a good candidate [facility] or [facility].  He would like more information on the two programs.  I call pulmonary rehab to get more information.  I was faxed information.  I will send the information in the mail to [name].  He also needed me to explain next steps with appts.  I clarified and will send the information in the mail today.   Telephone Outgoing Call  Treatment Phase Other  Barriers/Navigation Needs Education  Education Other  Interventions Education  Education Method Written;Verbal  Acuity Level 2  Acuity Level 2 Educational needs  Time Spent with Patient 30

## 2017-04-04 ENCOUNTER — Other Ambulatory Visit: Payer: Self-pay | Admitting: Thoracic Surgery (Cardiothoracic Vascular Surgery)

## 2017-04-04 DIAGNOSIS — Z902 Acquired absence of lung [part of]: Secondary | ICD-10-CM

## 2017-04-05 ENCOUNTER — Ambulatory Visit (INDEPENDENT_AMBULATORY_CARE_PROVIDER_SITE_OTHER): Payer: Self-pay | Admitting: Thoracic Surgery (Cardiothoracic Vascular Surgery)

## 2017-04-05 ENCOUNTER — Ambulatory Visit
Admission: RE | Admit: 2017-04-05 | Discharge: 2017-04-05 | Disposition: A | Discharge status: Home / Self Care | Payer: Medicare Other | Source: Ambulatory Visit | Attending: Thoracic Surgery (Cardiothoracic Vascular Surgery) | Admitting: Thoracic Surgery (Cardiothoracic Vascular Surgery)

## 2017-04-05 ENCOUNTER — Encounter: Payer: Self-pay | Admitting: Thoracic Surgery (Cardiothoracic Vascular Surgery)

## 2017-04-05 VITALS — BP 123/77 | HR 94 | Resp 20 | Ht 69.0 in | Wt 225.0 lb

## 2017-04-05 DIAGNOSIS — C3491 Malignant neoplasm of unspecified part of right bronchus or lung: Secondary | ICD-10-CM

## 2017-04-05 DIAGNOSIS — Z902 Acquired absence of lung [part of]: Secondary | ICD-10-CM

## 2017-04-05 NOTE — Progress Notes (Signed)
[name]       , [id number]             [phone]    HPI: [name] returns for a scheduled follow-up visit  [name] is a 71 year old gentleman with history of tobacco abuse (quit in 2005) and previous T3 N0 adenocarcinoma left upper lobe treated with surgery followed by adjuvant chemotherapy and radiation in 2005. He developed a groundglass opacity in the superior segment of the right lower lobe which I resected with the superior segmentectomy in January 2018. I last saw him in the office in February. He was doing well at that time.  He saw Dr. Julien Nordmann. He does not need any adjuvant therapy. He will observe him with CT scans per protocol.  [name] feels well. He doesn't have any pain related to his incision. His exercise tolerance is good. He has not had any issues with his breathing.  Past Medical History:  Diagnosis Date  . Adenocarcinoma of right lung, stage 1 ([place]) 03/03/2017  . Cancer ([place])    lung cancer 2005  . Cancer ([place])    skin cancer, mouth cancer  . COPD (chronic obstructive pulmonary disease) ([place])   . Glaucoma   . Hereditary and idiopathic peripheral neuropathy 09/26/2014  . Hyperlipidemia   . Hypertension   . Ischemic colitis ([place])   . 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 Polio   . Sleep apnea    no CPAP    Current Outpatient Prescriptions  Medication Sig Dispense Refill  . atorvastatin (LIPITOR) 10 MG tablet Take 10 mg by mouth every evening.     . carvedilol (COREG) 3.125 MG tablet Take 3.125 mg by mouth 2 (two) times daily with a meal.    . ferrous sulfate 325 (65 FE) MG tablet Take 325 mg by mouth every evening.     . latanoprost (XALATAN) 0.005 % ophthalmic solution Place 1 drop into both eyes at bedtime.    [name] Kitchen losartan (COZAAR) 50 MG tablet Take 50 mg by mouth daily.    . Multiple Vitamin (MULTIVITAMIN WITH MINERALS) TABS tablet Take 1 tablet by mouth daily.    [name] Kitchen warfarin (COUMADIN) 5 MG tablet Take 1 tablet (5  mg total) by mouth as directed. (Patient taking differently: Take 5-7.5 mg by mouth See admin instructions. Monday, Wednesday, and Friday 7.5 mg (1.5 tablets) & 5 mg (1 tablet) all other days.) 125 tablet 4   No current facility-administered medications for this visit.     Physical Exam BP 123/77   Pulse 94   Resp 20   Ht '5\' 9"'$  (1.753 m)   Wt 225 lb (102.1 kg)   SpO2 95% Comment: RA  BMI 33.68 kg/m  71 year old male in no distress Alert and oriented 3 with no focal deficits Incisions well healed Lungs slightly diminished at left base, otherwise clear Cardiac regular rate and rhythm normal S1 and S2  Diagnostic Tests: CHEST  2 VIEW  COMPARISON:  Radiographs 02/01/2017 and 01/15/2017. PET-CT 12/17/2016  FINDINGS: The heart size and mediastinal contours are stable. There is a left subclavian vascular stent. There are chronic postsurgical changes in the left hemithorax with associated pleural thickening and left hilar distortion. The right lung is clear. There is no pleural effusion or pneumothorax. No acute osseous findings are evident. Old left thoracotomy defects noted.  IMPRESSION: Stable postoperative chest.  No acute cardiopulmonary process.   Electronically Signed   By: Gwyndolyn Saxon  Lin Landsman M.D.   On: 04/05/2017 14:40 I personally reviewed the chest x-ray and concur with the findings noted above  Impression: [name] is a 71 year old gentleman who is now about 3 months out from a right lower lobe superior segmentectomy for stage IA adenocarcinoma. He previously had a stage IIb adenocarcinoma of the left upper lobe treated with surgery, chemotherapy, and radiation.  He is recovering remarkably well from his surgery. He has no complaints related to it.  He saw Dr. Julien Nordmann did not recommend any adjuvant therapy. He will have CT scans every 6 months with first 2 years and annually after that.  Plan: I will plan to see [name] back in 9 months with PA and lateral  chest x-ray for a one-year follow-up visit.  Melrose Nakayama, MD [facility] [phone]

## 2017-08-31 ENCOUNTER — Encounter (HOSPITAL_COMMUNITY): Payer: Self-pay

## 2017-08-31 ENCOUNTER — Other Ambulatory Visit (HOSPITAL_BASED_OUTPATIENT_CLINIC_OR_DEPARTMENT_OTHER): Payer: Medicare Other

## 2017-08-31 ENCOUNTER — Ambulatory Visit (HOSPITAL_COMMUNITY)
Admission: RE | Admit: 2017-08-31 | Discharge: 2017-08-31 | Disposition: A | Discharge status: Home / Self Care | Payer: Medicare Other | Source: Ambulatory Visit | Attending: Internal Medicine | Admitting: Internal Medicine

## 2017-08-31 DIAGNOSIS — C3431 Malignant neoplasm of lower lobe, right bronchus or lung: Secondary | ICD-10-CM

## 2017-08-31 DIAGNOSIS — C3491 Malignant neoplasm of unspecified part of right bronchus or lung: Secondary | ICD-10-CM

## 2017-08-31 DIAGNOSIS — Z902 Acquired absence of lung [part of]: Secondary | ICD-10-CM | POA: Diagnosis not present

## 2017-08-31 DIAGNOSIS — I700 Atherosclerosis of aorta: Secondary | ICD-10-CM | POA: Insufficient documentation

## 2017-08-31 DIAGNOSIS — J439 Emphysema, unspecified: Secondary | ICD-10-CM | POA: Insufficient documentation

## 2017-08-31 LAB — COMPREHENSIVE METABOLIC PANEL
ALBUMIN: 4 g/dL (ref 3.5–5.0)
ALK PHOS: 120 U/L (ref 40–150)
ALT: 36 U/L (ref 0–55)
ANION GAP: 12 meq/L — AB (ref 3–11)
AST: 36 U/L — ABNORMAL HIGH (ref 5–34)
BILIRUBIN TOTAL: 0.56 mg/dL (ref 0.20–1.20)
BUN: 10 mg/dL (ref 7.0–26.0)
CALCIUM: 9.8 mg/dL (ref 8.4–10.4)
CO2: 23 meq/L (ref 22–29)
CREATININE: 0.8 mg/dL (ref 0.7–1.3)
Chloride: 102 mEq/L (ref 98–109)
EGFR: >90 mL/min/{1.73_m2} (ref >90)
Glucose: 101 mg/dl (ref 70–140)
Potassium: 4.5 mEq/L (ref 3.5–5.1)
Sodium: 137 mEq/L (ref 136–145)
TOTAL PROTEIN: 7.4 g/dL (ref 6.4–8.3)

## 2017-08-31 LAB — CBC WITH DIFFERENTIAL/PLATELET
BASO%: 0.8 % (ref 0.0–2.0)
Basophils Absolute: 0.1 10*3/uL (ref 0.0–0.1)
EOS ABS: 0.2 10*3/uL (ref 0.0–0.5)
EOS%: 2.5 % (ref 0.0–7.0)
HCT: 47 % (ref 38.4–49.9)
HEMOGLOBIN: 15.9 g/dL (ref 13.0–17.1)
LYMPH#: 1.7 10*3/uL (ref 0.9–3.3)
LYMPH%: 20.2 % (ref 14.0–49.0)
MCH: 32.5 pg (ref 27.2–33.4)
MCHC: 33.8 g/dL (ref 32.0–36.0)
MCV: 95.9 fL (ref 79.3–98.0)
MONO#: 0.9 10*3/uL (ref 0.1–0.9)
MONO%: 10.4 % (ref 0.0–14.0)
NEUT#: 5.5 10*3/uL (ref 1.5–6.5)
NEUT%: 66.1 % (ref 39.0–75.0)
PLATELETS: 219 10*3/uL (ref 140–400)
RBC: 4.9 10*6/uL (ref 4.20–5.82)
RDW: 14.3 % (ref 11.0–14.6)
WBC: 8.3 10*3/uL (ref 4.0–10.3)

## 2017-08-31 MED ORDER — IOPAMIDOL (ISOVUE-300) INJECTION 61%
75.0000 mL | Freq: Once | INTRAVENOUS | Status: AC | PRN
  Administered 2017-08-31: 75 mL via INTRAVENOUS

## 2017-08-31 MED ORDER — IOPAMIDOL (ISOVUE-300) INJECTION 61%
INTRAVENOUS | Status: AC
  Filled 2017-08-31: qty 75

## 2017-09-06 ENCOUNTER — Other Ambulatory Visit: Payer: Self-pay | Admitting: Medical Oncology

## 2017-09-06 DIAGNOSIS — C3491 Malignant neoplasm of unspecified part of right bronchus or lung: Secondary | ICD-10-CM

## 2017-09-07 ENCOUNTER — Other Ambulatory Visit (HOSPITAL_BASED_OUTPATIENT_CLINIC_OR_DEPARTMENT_OTHER): Payer: Medicare Other

## 2017-09-07 ENCOUNTER — Ambulatory Visit (HOSPITAL_BASED_OUTPATIENT_CLINIC_OR_DEPARTMENT_OTHER): Payer: Medicare Other | Admitting: Internal Medicine

## 2017-09-07 ENCOUNTER — Telehealth: Payer: Self-pay | Admitting: Internal Medicine

## 2017-09-07 ENCOUNTER — Encounter: Payer: Self-pay | Admitting: Internal Medicine

## 2017-09-07 VITALS — BP 117/74 | HR 93 | Temp 98.9°F | Resp 17 | Wt 216.0 lb

## 2017-09-07 DIAGNOSIS — C3491 Malignant neoplasm of unspecified part of right bronchus or lung: Secondary | ICD-10-CM

## 2017-09-07 DIAGNOSIS — R0609 Other forms of dyspnea: Secondary | ICD-10-CM | POA: Diagnosis not present

## 2017-09-07 DIAGNOSIS — C3431 Malignant neoplasm of lower lobe, right bronchus or lung: Secondary | ICD-10-CM

## 2017-09-07 DIAGNOSIS — C3490 Malignant neoplasm of unspecified part of unspecified bronchus or lung: Secondary | ICD-10-CM

## 2017-09-07 LAB — COMPREHENSIVE METABOLIC PANEL
ALBUMIN: 3.8 g/dL (ref 3.5–5.0)
ALK PHOS: 107 U/L (ref 40–150)
ALT: 36 U/L (ref 0–55)
AST: 42 U/L — AB (ref 5–34)
Anion Gap: 9 mEq/L (ref 3–11)
BILIRUBIN TOTAL: 0.54 mg/dL (ref 0.20–1.20)
BUN: 8.6 mg/dL (ref 7.0–26.0)
CALCIUM: 9.5 mg/dL (ref 8.4–10.4)
CO2: 28 mEq/L (ref 22–29)
Chloride: 102 mEq/L (ref 98–109)
Creatinine: 0.8 mg/dL (ref 0.7–1.3)
EGFR: >90 mL/min/{1.73_m2} (ref >90)
GLUCOSE: 102 mg/dL (ref 70–140)
Potassium: 4.7 mEq/L (ref 3.5–5.1)
SODIUM: 138 meq/L (ref 136–145)
Total Protein: 7.1 g/dL (ref 6.4–8.3)

## 2017-09-07 LAB — CBC WITH DIFFERENTIAL/PLATELET
BASO%: 0.9 % (ref 0.0–2.0)
BASOS ABS: 0.1 10*3/uL (ref 0.0–0.1)
EOS ABS: 0.2 10*3/uL (ref 0.0–0.5)
EOS%: 2.8 % (ref 0.0–7.0)
HEMATOCRIT: 45.2 % (ref 38.4–49.9)
HEMOGLOBIN: 15.3 g/dL (ref 13.0–17.1)
LYMPH#: 1.8 10*3/uL (ref 0.9–3.3)
LYMPH%: 23.5 % (ref 14.0–49.0)
MCH: 32.3 pg (ref 27.2–33.4)
MCHC: 33.7 g/dL (ref 32.0–36.0)
MCV: 95.8 fL (ref 79.3–98.0)
MONO#: 0.9 10*3/uL (ref 0.1–0.9)
MONO%: 12.2 % (ref 0.0–14.0)
NEUT%: 60.6 % (ref 39.0–75.0)
NEUTROS ABS: 4.5 10*3/uL (ref 1.5–6.5)
Platelets: 228 10*3/uL (ref 140–400)
RBC: 4.72 10*6/uL (ref 4.20–5.82)
RDW: 14.3 % (ref 11.0–14.6)
WBC: 7.5 10*3/uL (ref 4.0–10.3)

## 2017-09-07 NOTE — Telephone Encounter (Signed)
Gave patient avs and calendar with appts per 9/19 los

## 2017-09-07 NOTE — Progress Notes (Signed)
[place] Telephone:([phone]   [phone]  OFFICE PROGRESS NOTE  Hulan Fess, MD [place] [address]  DIAGNOSIS: Stage IA (T1a, N0, M0) non-small cell lung cancer, adenocarcinoma diagnosed in January 2018  PRIOR THERAPY: Status post right lower lobe superior segmentectomy with lymph node dissection under the care of Dr. Roxan Hockey.  CURRENT THERAPY: Observation.  INTERVAL HISTORY: [name] 71 y.o. male returns to the clinic today for six-month follow-up visit accompanied by his wife. Patient is feeling well today with no specific complaints except for shortness breath with exertion. He denied having any chest pain, cough or hemoptysis. He denied having any recent weight loss or night sweats. He has no nausea, vomiting, diarrhea or constipation. He had repeat CT scan of the chest performed recently and he is here for evaluation and discussion of his current results.  MEDICAL HISTORY: Past Medical History:  Diagnosis Date  . Adenocarcinoma of right lung, stage 1 ([place]) 03/03/2017  . Cancer ([place])    lung cancer 2005  . Cancer ([place])    skin cancer, mouth cancer  . COPD (chronic obstructive pulmonary disease) ([place])   . Glaucoma   . Hereditary and idiopathic peripheral neuropathy 09/26/2014  . Hyperlipidemia   . Hypertension   . Ischemic colitis ([place])   . Left subclavian vein thrombosis ([place])   . Polio   . Sleep apnea    no CPAP    ALLERGIES:  is allergic to no known allergies.  MEDICATIONS:  Current Outpatient Prescriptions  Medication Sig Dispense Refill  . atorvastatin (LIPITOR) 10 MG tablet Take 10 mg by mouth every evening.     . carvedilol (COREG) 3.125 MG tablet Take 3.125 mg by mouth 2 (two) times daily with a meal.    . ferrous sulfate 325 (65 FE) MG tablet Take 325 mg by mouth every evening.     Marland Kitchen FLUZONE HIGH-DOSE 0.5 ML injection TO BE ADMINISTERED BY PHARMACIST FOR IMMUNIZATION  0  . latanoprost  (XALATAN) 0.005 % ophthalmic solution Place 1 drop into both eyes at bedtime.    Marland Kitchen losartan (COZAAR) 50 MG tablet Take 50 mg by mouth daily.    . Multiple Vitamin (MULTIVITAMIN WITH MINERALS) TABS tablet Take 1 tablet by mouth daily.    Marland Kitchen warfarin (COUMADIN) 5 MG tablet Take 1 tablet (5 mg total) by mouth as directed. (Patient taking differently: Take 5-7.5 mg by mouth See admin instructions. Monday, Wednesday, and Friday 7.5 mg (1.5 tablets) & 5 mg (1 tablet) all other days.) 125 tablet 4   No current facility-administered medications for this visit.     SURGICAL HISTORY:  Past Surgical History:  Procedure Laterality Date  . HERNIA REPAIR     left ingunal  . LUNG REMOVAL, PARTIAL Left   . SEGMENTECOMY Right 01/12/2017   Procedure: RIGHT SUPERIOR SEGMENTECTOMY;  Surgeon: Melrose Nakayama, MD;  Location: [facility];  Service: Thoracic;  Laterality: Right;  . SUBCLAVIAN STENT PLACEMENT    . TONSILLECTOMY    . VIDEO ASSISTED THORACOSCOPY Right 01/12/2017   Procedure: VIDEO ASSISTED THORACOSCOPY;  Surgeon: Melrose Nakayama, MD;  Location: [facility] OR;  Service: Thoracic;  Laterality: Right;    REVIEW OF SYSTEMS:  A comprehensive review of systems was negative except for: Respiratory: positive for dyspnea on exertion   PHYSICAL EXAMINATION: General appearance: alert, cooperative and no distress Head: Normocephalic, without obvious abnormality, atraumatic Neck: no adenopathy, no JVD, supple, symmetrical, trachea midline  and thyroid not enlarged, symmetric, no tenderness/mass/nodules Lymph nodes: Cervical, supraclavicular, and axillary nodes normal. Resp: clear to auscultation bilaterally Back: symmetric, no curvature. ROM normal. No CVA tenderness. Cardio: regular rate and rhythm, S1, S2 normal, no murmur, click, rub or gallop GI: soft, non-tender; bowel sounds normal; no masses,  no organomegaly Extremities: extremities normal, atraumatic, no cyanosis or edema  ECOG PERFORMANCE STATUS: 1 -  Symptomatic but completely ambulatory  Blood pressure 117/74, pulse 93, temperature 98.9 F (37.2 C), temperature source Oral, resp. rate 17, weight 216 lb (98 kg), SpO2 96 %.  LABORATORY DATA: Lab Results  Component Value Date   WBC 7.5 09/07/2017   HGB 15.3 09/07/2017   HCT 45.2 09/07/2017   MCV 95.8 09/07/2017   PLT 228 09/07/2017      Chemistry      Component Value Date/Time   NA 138 09/07/2017 1051   K 4.7 09/07/2017 1051   CL 106 01/14/2017 0426   CO2 28 09/07/2017 1051   BUN 8.6 09/07/2017 1051   CREATININE 0.8 09/07/2017 1051      Component Value Date/Time   CALCIUM 9.5 09/07/2017 1051   ALKPHOS 107 09/07/2017 1051   AST 42 (H) 09/07/2017 1051   ALT 36 09/07/2017 1051   BILITOT 0.54 09/07/2017 1051       RADIOGRAPHIC STUDIES: Ct Chest W Contrast  Result Date: 08/31/2017 CLINICAL DATA:  Right lower lobe superior segmentectomy for stage IA adenocarcinoma. More remote history of stage IIIB adenocarcinoma of the left upper lobe treated with surgery. EXAM: CT CHEST WITH CONTRAST TECHNIQUE: Multidetector CT imaging of the chest was performed during intravenous contrast administration. CONTRAST:  46mL ISOVUE-300 IOPAMIDOL (ISOVUE-300) INJECTION 61% COMPARISON:  09/17/2016 FINDINGS: Cardiovascular: The heart size is normal. No pericardial effusion. Coronary artery calcification is evident. Atherosclerotic calcification is noted in the wall of the thoracic aorta. Mediastinum/Nodes: No mediastinal lymphadenopathy. There is no hilar lymphadenopathy. The esophagus has normal imaging features. There is no axillary lymphadenopathy. Lungs/Pleura: Stable appearance pleural-parenchymal scarring in the left apex with no change in the sclerosis of overlying upper left ribs. Volume loss in the left hemithorax is compatible with prior left upper lobectomy. Emphysema noted without new left pulmonary parenchymal nodule or mass. Centrilobular and paraseptal emphysema also noted in the right  lung. Suture line visible in the right lower lobe. No suspicious pulmonary nodule or mass in the right lung. No pleural effusion on either side. Upper Abdomen: The liver shows diffusely decreased attenuation suggesting steatosis. No adrenal nodule or mass. Musculoskeletal: No new worrisome lytic or sclerotic bony abnormality. Healed posttraumatic deformity posterior left rib. IMPRESSION: 1. Interval resection of the adenocarcinoma in the superior segment right lower lobe. No evidence for local recurrence. 2. Status post left upper lobectomy. Stable appearance of post treatment change in the left upper hemithorax. 3. No findings to suggest metastatic disease in the chest on today's study. 4.  Emphysema. (ICD10-J43.9) 5. Coronary artery and Aortic Atherosclerois (ICD10-170.0) Electronically Signed   By: Misty Stanley M.D.   On: 08/31/2017 15:39    ASSESSMENT AND PLAN: This is a very pleasant 71 years old white male with stage IA non-small cell lung cancer, adenocarcinoma status post right lower lobe superior segmentectomy with lymph node dissection. The patient is currently on observation and he is feeling fine. His recent CT scan of the chest showed no evidence for disease recurrence. I discussed the scan results with the patient and his wife who recommended for him to continue on observation with  repeat CT scan of the chest in 6 months. He was advised to call immediately if he has any concerning symptoms in the interval. The patient voices understanding of current disease status and treatment options and is in agreement with the current care plan.  All questions were answered. The patient knows to call the clinic with any problems, questions or concerns. We can certainly see the patient much sooner if necessary.  I spent 10 minutes counseling the patient face to face. The total time spent in the appointment was 15 minutes.  Disclaimer: This note was dictated with voice recognition software. Similar  sounding words can inadvertently be transcribed and may not be corrected upon review.

## 2018-01-02 ENCOUNTER — Other Ambulatory Visit: Payer: Self-pay | Admitting: Thoracic Surgery (Cardiothoracic Vascular Surgery)

## 2018-01-02 DIAGNOSIS — C3491 Malignant neoplasm of unspecified part of right bronchus or lung: Secondary | ICD-10-CM

## 2018-01-03 ENCOUNTER — Encounter: Payer: Self-pay | Admitting: Thoracic Surgery (Cardiothoracic Vascular Surgery)

## 2018-01-03 ENCOUNTER — Ambulatory Visit (INDEPENDENT_AMBULATORY_CARE_PROVIDER_SITE_OTHER): Payer: Medicare Other | Admitting: Thoracic Surgery (Cardiothoracic Vascular Surgery)

## 2018-01-03 ENCOUNTER — Other Ambulatory Visit: Payer: Self-pay

## 2018-01-03 ENCOUNTER — Ambulatory Visit
Admission: RE | Admit: 2018-01-03 | Discharge: 2018-01-03 | Disposition: A | Discharge status: Home / Self Care | Payer: Medicare Other | Source: Ambulatory Visit | Attending: Thoracic Surgery (Cardiothoracic Vascular Surgery) | Admitting: Thoracic Surgery (Cardiothoracic Vascular Surgery)

## 2018-01-03 VITALS — BP 127/76 | HR 90 | Resp 18 | Ht 69.0 in | Wt 217.8 lb

## 2018-01-03 DIAGNOSIS — Z902 Acquired absence of lung [part of]: Secondary | ICD-10-CM | POA: Diagnosis not present

## 2018-01-03 DIAGNOSIS — C3491 Malignant neoplasm of unspecified part of right bronchus or lung: Secondary | ICD-10-CM

## 2018-01-03 NOTE — Progress Notes (Signed)
[address]       [place] [address]             [phone]       HPI: Mr. Chamberlin returns for a one year follow-up visit  Mr. Satz is a 72 year old man with a remote history of tobacco abuse (quit in 2005).  He had a left upper lobectomy followed by adjuvant chemo and radiation in 2005 for a T3 N0 adenocarcinoma.  Later developed a groundglass opacity in the superior segment of the right lower lobe.  I did a superior segmentectomy and January 2018.  Saw him in the office in April and he was recovering well.  He saw Dr. Julien Nordmann in September.  His CT looked good at that time.  He is scheduled to see him back again in March.  Mr. Nancarrow feels well.  He is not having any residual pain related to the surgery.  He is active.  He is not having any chest pain, tightness, or shortness of breath.  His appetite is good and his weight is stable.  He does not have any unusual headaches or visual changes.  Past Medical History:  Diagnosis Date  . Adenocarcinoma of right lung, stage 1 ([place]) 03/03/2017  . Cancer ([place])    lung cancer 2005  . Cancer ([place])    skin cancer, mouth cancer  . COPD (chronic obstructive pulmonary disease) ([place])   . Glaucoma   . Hereditary and idiopathic peripheral neuropathy 09/26/2014  . Hyperlipidemia   . Hypertension   . Ischemic colitis ([place])   . Left subclavian vein thrombosis ([place])   . Polio   . Sleep apnea    no CPAP    Current Outpatient Medications  Medication Sig Dispense Refill  . atorvastatin (LIPITOR) 10 MG tablet Take 10 mg by mouth every evening.     . carvedilol (COREG) 3.125 MG tablet Take 3.125 mg by mouth 2 (two) times daily with a meal.    . ferrous sulfate 325 (65 FE) MG tablet Take 325 mg by mouth every evening.     Marland Kitchen FLUZONE HIGH-DOSE 0.5 ML injection TO BE ADMINISTERED BY PHARMACIST FOR IMMUNIZATION  0  . latanoprost (XALATAN) 0.005 % ophthalmic solution Place 1 drop into both eyes at bedtime.    Marland Kitchen losartan (COZAAR) 50  MG tablet Take 50 mg by mouth daily.    . Multiple Vitamin (MULTIVITAMIN WITH MINERALS) TABS tablet Take 1 tablet by mouth daily.    Marland Kitchen warfarin (COUMADIN) 5 MG tablet Take 1 tablet (5 mg total) by mouth as directed. (Patient taking differently: Take 5-7.5 mg by mouth See admin instructions. Monday, Wednesday, and Friday 7.5 mg (1.5 tablets) & 5 mg (1 tablet) all other days.) 125 tablet 4   No current facility-administered medications for this visit.     Physical Exam BP 127/76 (BP Location: Left Arm, Patient Position: Sitting, Cuff Size: Large)   Pulse 90   Resp 18   Ht 5\' 9"  (1.753 m)   Wt 217 lb 12.8 oz (98.8 kg)   SpO2 96% Comment: RA  BMI 50.58 kg/m  62-year-old man in no acute distress Alert and oriented x3 with no focal deficits Lungs clear with equal breath sounds bilaterally Cardiac regular rate and rhythm normal S1-S2 No cervical or subclavicular adenopathy  Diagnostic Tests: CHEST  2 VIEW  COMPARISON:  Chest CT 08/31/2017 and earlier.  FINDINGS: Stable lung volumes. Stable mediastinal contours. No pneumothorax, pulmonary edema, pleural  effusion or acute pulmonary opacity. Chronic left supraclavicular region vascular stent. Visualized tracheal air column is within normal limits. Chronic left lateral 6th and 7th rib fractures. Stable visualized osseous structures. Negative visible bowel gas pattern. Abdominal Calcified aortic atherosclerosis.  IMPRESSION: 1. Stable since April 2018 and satisfactory radiographic post treatment appearance of the chest. 2.  Aortic Atherosclerosis (ICD10-I70.0).   Electronically Signed   By: Genevie Ann M.D.   On: 01/03/2018 09:23 I personally reviewed the chest x-ray images and concur with the findings noted above.  Impression: Mr. Ortwein is a 72 year old man with history of 2 prior lung cancers.  He had a T3 N0 adenocarcinoma back in 2005.  That was treated with lobectomy followed by adjuvant chemo and radiation.  More recently I  did a superior segmentectomy for stage IA lesion in the right lower lobe a year ago.  He is doing well with no problems related to his surgery.  He has no evidence of recurrent disease.  He will be following up with Dr. Julien Nordmann.  At this point he does not have any surgical issues.  Since Dr. Julien Nordmann will be following him he does not have to see me as well.  I will be happy to see him back anytime the future if I can be of any further assistance with his care.  Aortic atherosclerosis-noted on chest x-ray.  Asymptomatic.   Melrose Nakayama, MD [facility] [phone]

## 2018-01-12 ENCOUNTER — Telehealth: Payer: Self-pay | Admitting: Internal Medicine

## 2018-01-12 NOTE — Telephone Encounter (Signed)
Pt called in and left message about rescheduling CT f/u after ct - appts rescheduled and message left on patient vmail with apts date and time.

## 2018-03-01 ENCOUNTER — Other Ambulatory Visit: Payer: Medicare Other

## 2018-03-08 ENCOUNTER — Ambulatory Visit: Payer: Medicare Other | Admitting: Internal Medicine

## 2018-03-13 ENCOUNTER — Telehealth: Payer: Self-pay

## 2018-03-13 NOTE — Telephone Encounter (Signed)
Patient called concerning npo before appointment scheduled for 3/26 for labs and ct. Per 3/25 phone que

## 2018-03-14 ENCOUNTER — Ambulatory Visit (HOSPITAL_COMMUNITY)
Admission: RE | Admit: 2018-03-14 | Discharge: 2018-03-14 | Disposition: A | Discharge status: Home / Self Care | Payer: Medicare Other | Source: Ambulatory Visit | Attending: Internal Medicine | Admitting: Internal Medicine

## 2018-03-14 ENCOUNTER — Inpatient Hospital Stay: Payer: Medicare Other | Attending: Internal Medicine

## 2018-03-14 DIAGNOSIS — J439 Emphysema, unspecified: Secondary | ICD-10-CM | POA: Insufficient documentation

## 2018-03-14 DIAGNOSIS — Z902 Acquired absence of lung [part of]: Secondary | ICD-10-CM | POA: Insufficient documentation

## 2018-03-14 DIAGNOSIS — Z9889 Other specified postprocedural states: Secondary | ICD-10-CM | POA: Diagnosis not present

## 2018-03-14 DIAGNOSIS — C3491 Malignant neoplasm of unspecified part of right bronchus or lung: Secondary | ICD-10-CM | POA: Diagnosis not present

## 2018-03-14 DIAGNOSIS — J984 Other disorders of lung: Secondary | ICD-10-CM | POA: Insufficient documentation

## 2018-03-14 DIAGNOSIS — I700 Atherosclerosis of aorta: Secondary | ICD-10-CM | POA: Insufficient documentation

## 2018-03-14 DIAGNOSIS — C3431 Malignant neoplasm of lower lobe, right bronchus or lung: Secondary | ICD-10-CM | POA: Insufficient documentation

## 2018-03-14 DIAGNOSIS — R0609 Other forms of dyspnea: Secondary | ICD-10-CM | POA: Insufficient documentation

## 2018-03-14 DIAGNOSIS — C3490 Malignant neoplasm of unspecified part of unspecified bronchus or lung: Secondary | ICD-10-CM | POA: Diagnosis present

## 2018-03-14 LAB — CBC WITH DIFFERENTIAL/PLATELET
BASOS ABS: 0 10*3/uL (ref 0.0–0.1)
BASOS PCT: 1 %
Eosinophils Absolute: 0.2 10*3/uL (ref 0.0–0.5)
Eosinophils Relative: 4 %
HEMATOCRIT: 42.6 % (ref 38.4–49.9)
HEMOGLOBIN: 14.3 g/dL (ref 13.0–17.1)
Lymphocytes Relative: 27 %
Lymphs Abs: 1.3 10*3/uL (ref 0.9–3.3)
MCH: 31.9 pg (ref 27.2–33.4)
MCHC: 33.5 g/dL (ref 32.0–36.0)
MCV: 95.3 fL (ref 79.3–98.0)
Monocytes Absolute: 0.5 10*3/uL (ref 0.1–0.9)
Monocytes Relative: 11 %
NEUTROS ABS: 2.8 10*3/uL (ref 1.5–6.5)
NEUTROS PCT: 57 %
Platelets: 247 10*3/uL (ref 140–400)
RBC: 4.47 MIL/uL (ref 4.20–5.82)
RDW: 14 % (ref 11.0–14.6)
WBC: 4.9 10*3/uL (ref 4.0–10.3)

## 2018-03-14 LAB — COMPREHENSIVE METABOLIC PANEL
ALBUMIN: 3.3 g/dL — AB (ref 3.5–5.0)
ALK PHOS: 111 U/L (ref 40–150)
ALT: 22 U/L (ref 0–55)
AST: 25 U/L (ref 5–34)
Anion gap: 9 (ref 3–11)
BILIRUBIN TOTAL: 0.3 mg/dL (ref 0.2–1.2)
BUN: 8 mg/dL (ref 7–26)
CO2: 25 mmol/L (ref 22–29)
Calcium: 9.2 mg/dL (ref 8.4–10.4)
Chloride: 108 mmol/L (ref 98–109)
Creatinine, Ser: 0.73 mg/dL (ref 0.70–1.30)
GFR calc Af Amer: >60 mL/min (ref >60)
GFR calc non Af Amer: >60 mL/min (ref >60)
GLUCOSE: 89 mg/dL (ref 70–140)
POTASSIUM: 4 mmol/L (ref 3.5–5.1)
Sodium: 142 mmol/L (ref 136–145)
TOTAL PROTEIN: 6.7 g/dL (ref 6.4–8.3)

## 2018-03-14 MED ORDER — IOPAMIDOL (ISOVUE-300) INJECTION 61%
INTRAVENOUS | Status: AC
  Administered 2018-03-14: 75 mL
  Filled 2018-03-14: qty 75

## 2018-03-16 ENCOUNTER — Encounter: Payer: Self-pay | Admitting: Internal Medicine

## 2018-03-16 ENCOUNTER — Telehealth: Payer: Self-pay

## 2018-03-16 ENCOUNTER — Inpatient Hospital Stay (HOSPITAL_BASED_OUTPATIENT_CLINIC_OR_DEPARTMENT_OTHER): Payer: Medicare Other | Admitting: Internal Medicine

## 2018-03-16 DIAGNOSIS — R0609 Other forms of dyspnea: Secondary | ICD-10-CM | POA: Diagnosis not present

## 2018-03-16 DIAGNOSIS — C3490 Malignant neoplasm of unspecified part of unspecified bronchus or lung: Secondary | ICD-10-CM

## 2018-03-16 DIAGNOSIS — C3431 Malignant neoplasm of lower lobe, right bronchus or lung: Secondary | ICD-10-CM

## 2018-03-16 NOTE — Progress Notes (Signed)
[facility] Telephone:([phone]) [phone]   Fax:(336) [phone]  OFFICE PROGRESS NOTE  Hulan Fess, MD [place] [address]  DIAGNOSIS: Stage IA (T1a, N0, M0) non-small cell lung cancer, adenocarcinoma diagnosed in January 2018  PRIOR THERAPY: Status post right lower lobe superior segmentectomy with lymph node dissection under the care of Dr. Roxan Hockey.  CURRENT THERAPY: Observation.  INTERVAL HISTORY: [name] 72 y.o. male returns to the clinic today for follow-up visit accompanied by his wife.  The patient is feeling fine today with no specific complaints.  He continues to have mild wheezes and was started on Spiriva by his primary care physician.  He denied having any chest pain but has shortness of breath with exertion with no cough or hemoptysis.  He denied having any fever or chills.  He has no nausea, vomiting, diarrhea or constipation.  He gained a few pounds recently.  The patient had repeat CT scan of the chest performed recently and he is here for evaluation and discussion of his discuss results.   MEDICAL HISTORY: Past Medical History:  Diagnosis Date  . Adenocarcinoma of right lung, stage 1 ([facility]) 03/03/2017  . Cancer ([facility])    lung cancer 2005  . Cancer ([facility])    skin cancer, mouth cancer  . COPD (chronic obstructive pulmonary disease) ([facility])   . Glaucoma   . Hereditary and idiopathic peripheral neuropathy 09/26/2014  . Hyperlipidemia   . Hypertension   . Ischemic colitis ([facility])   . Left subclavian vein thrombosis ([facility])   . Polio   . Sleep apnea    no CPAP    ALLERGIES:  is allergic to no known allergies.  MEDICATIONS:  Current Outpatient Medications  Medication Sig Dispense Refill  . atorvastatin (LIPITOR) 10 MG tablet Take 10 mg by mouth every evening.     . carvedilol (COREG) 3.125 MG tablet Take 3.125 mg by mouth 2 (two) times daily with a meal.    . ferrous sulfate 325 (65 FE) MG tablet Take 325 mg by mouth every  evening.     Marland Kitchen FLUZONE HIGH-DOSE 0.5 ML injection TO BE ADMINISTERED BY PHARMACIST FOR IMMUNIZATION  0  . latanoprost (XALATAN) 0.005 % ophthalmic solution Place 1 drop into both eyes at bedtime.    Marland Kitchen losartan (COZAAR) 50 MG tablet Take 50 mg by mouth daily.    . Multiple Vitamin (MULTIVITAMIN WITH MINERALS) TABS tablet Take 1 tablet by mouth daily.    Marland Kitchen warfarin (COUMADIN) 5 MG tablet Take 1 tablet (5 mg total) by mouth as directed. (Patient taking differently: Take 5-7.5 mg by mouth See admin instructions. Monday, Wednesday, and Friday 7.5 mg (1.5 tablets) & 5 mg (1 tablet) all other days.) 125 tablet 4   No current facility-administered medications for this visit.     SURGICAL HISTORY:  Past Surgical History:  Procedure Laterality Date  . HERNIA REPAIR     left ingunal  . LUNG REMOVAL, PARTIAL Left   . SEGMENTECOMY Right 01/12/2017   Procedure: RIGHT SUPERIOR SEGMENTECTOMY;  Surgeon: Melrose Nakayama, MD;  Location: [facility];  Service: Thoracic;  Laterality: Right;  . SUBCLAVIAN STENT PLACEMENT    . TONSILLECTOMY    . VIDEO ASSISTED THORACOSCOPY Right 01/12/2017   Procedure: VIDEO ASSISTED THORACOSCOPY;  Surgeon: Melrose Nakayama, MD;  Location: [facility];  Service: Thoracic;  Laterality: Right;    REVIEW OF SYSTEMS:  A comprehensive review of systems was negative except for: Respiratory:  positive for wheezing   PHYSICAL EXAMINATION: General appearance: alert, cooperative and no distress Head: Normocephalic, without obvious abnormality, atraumatic Neck: no adenopathy, no JVD, supple, symmetrical, trachea midline and thyroid not enlarged, symmetric, no tenderness/mass/nodules Lymph nodes: Cervical, supraclavicular, and axillary nodes normal. Resp: clear to auscultation bilaterally Back: symmetric, no curvature. ROM normal. No CVA tenderness. Cardio: regular rate and rhythm, S1, S2 normal, no murmur, click, rub or gallop GI: soft, non-tender; bowel sounds normal; no masses,  no  organomegaly Extremities: extremities normal, atraumatic, no cyanosis or edema  ECOG PERFORMANCE STATUS: 1 - Symptomatic but completely ambulatory  Blood pressure (!) 163/84, pulse 82, temperature 97.8 F (36.6 C), temperature source Oral, resp. rate 18, height 5\' 9"  (1.753 m), weight 221 lb 12.8 oz (100.6 kg), SpO2 95 %.  LABORATORY DATA: Lab Results  Component Value Date   WBC 4.9 03/14/2018   HGB 14.3 03/14/2018   HCT 42.6 03/14/2018   MCV 95.3 03/14/2018   PLT 247 03/14/2018      Chemistry      Component Value Date/Time   NA 142 03/14/2018 0915   NA 138 09/07/2017 1051   K 4.0 03/14/2018 0915   K 4.7 09/07/2017 1051   CL 108 03/14/2018 0915   CO2 25 03/14/2018 0915   CO2 28 09/07/2017 1051   BUN 8 03/14/2018 0915   BUN 8.6 09/07/2017 1051   CREATININE 0.73 03/14/2018 0915   CREATININE 0.8 09/07/2017 1051      Component Value Date/Time   CALCIUM 9.2 03/14/2018 0915   CALCIUM 9.5 09/07/2017 1051   ALKPHOS 111 03/14/2018 0915   ALKPHOS 107 09/07/2017 1051   AST 25 03/14/2018 0915   AST 42 (H) 09/07/2017 1051   ALT 22 03/14/2018 0915   ALT 36 09/07/2017 1051   BILITOT 0.3 03/14/2018 0915   BILITOT 0.54 09/07/2017 1051       RADIOGRAPHIC STUDIES: Ct Chest W Contrast  Result Date: 03/14/2018 CLINICAL DATA:  Right-sided non-small cell lung cancer. History of left upper lobe resection for cancer in 2005. EXAM: CT CHEST WITH CONTRAST TECHNIQUE: Multidetector CT imaging of the chest was performed during intravenous contrast administration. CONTRAST:  19mL ISOVUE-300 IOPAMIDOL (ISOVUE-300) INJECTION 61% COMPARISON:  08/31/2017 FINDINGS: Cardiovascular: The heart size is normal. No pericardial effusion. Coronary artery calcification is evident. Atherosclerotic calcification is noted in the wall of the thoracic aorta. Mediastinum/Nodes: No mediastinal lymphadenopathy. There is no hilar lymphadenopathy. The esophagus has normal imaging features. There is no axillary  lymphadenopathy. Lungs/Pleura: Centrilobular emphysema again noted. Volume loss in the left hemithorax is compatible with reported history of left upper lobectomy. Suture material and scarring in the posterior right lung is similar to prior. 4 mm anterior right lung nodule (image 82/series 5) is stable in the interval. No new or progressive pulmonary nodule or mass. Upper Abdomen: The liver shows diffusely decreased attenuation suggesting steatosis. Tiny hypervascular focus subcapsular lateral segment left liver is stable in the interval. Musculoskeletal: Bone windows reveal no worrisome lytic or sclerotic osseous lesions. Old posterior rib fractures noted bilaterally. IMPRESSION: 1. Stable exam.  No new or progressive interval findings. 2. Postsurgical changes right lower lobe with scarring. 3. Status post left upper lobectomy. 4.  Emphysema. (ICD10-J43.9) 5.  Aortic Atherosclerois (ICD10-170.0) Electronically Signed   By: Misty Stanley M.D.   On: 03/14/2018 14:09    ASSESSMENT AND PLAN: This is a very pleasant 72 years old white male with stage IA non-small cell lung cancer, adenocarcinoma status post right lower lobe  superior segmentectomy with lymph node dissection. The patient is currently on observation.  He denied having any significant complaints except for intermittent wheezes and he is currently on Spiriva by his primary care physician. Repeat CT scan of the chest showed no concerning findings for disease recurrence or progression.  I discussed the scan results with the patient and his wife and recommended for him to continue in observation with repeat CT scan of the chest in 6 months. The patient was advised to call immediately if he has any concerning symptoms in the interval. The patient voices understanding of current disease status and treatment options and is in agreement with the current care plan.  All questions were answered. The patient knows to call the clinic with any problems,  questions or concerns. We can certainly see the patient much sooner if necessary.  I spent 10 minutes counseling the patient face to face. The total time spent in the appointment was 15 minutes.  Disclaimer: This note was dictated with voice recognition software. Similar sounding words can inadvertently be transcribed and may not be corrected upon review.

## 2018-03-16 NOTE — Telephone Encounter (Signed)
Printed avs and calender of upcoming appointment. Per 3/28 los

## 2018-09-04 ENCOUNTER — Telehealth: Payer: Self-pay | Admitting: Internal Medicine

## 2018-09-04 NOTE — Telephone Encounter (Signed)
MM PAL 9/26 - moved f/u to 10/3. Spoke with patient. Other appointments remain the same.

## 2018-09-12 ENCOUNTER — Inpatient Hospital Stay: Payer: Medicare Other | Attending: Internal Medicine

## 2018-09-12 ENCOUNTER — Ambulatory Visit (HOSPITAL_COMMUNITY)
Admission: RE | Admit: 2018-09-12 | Discharge: 2018-09-12 | Disposition: A | Discharge status: Home / Self Care | Payer: Medicare Other | Source: Ambulatory Visit | Attending: Internal Medicine | Admitting: Internal Medicine

## 2018-09-12 DIAGNOSIS — C3490 Malignant neoplasm of unspecified part of unspecified bronchus or lung: Secondary | ICD-10-CM

## 2018-09-12 DIAGNOSIS — J439 Emphysema, unspecified: Secondary | ICD-10-CM | POA: Insufficient documentation

## 2018-09-12 DIAGNOSIS — I700 Atherosclerosis of aorta: Secondary | ICD-10-CM | POA: Insufficient documentation

## 2018-09-12 DIAGNOSIS — C3431 Malignant neoplasm of lower lobe, right bronchus or lung: Secondary | ICD-10-CM | POA: Diagnosis not present

## 2018-09-12 DIAGNOSIS — Z902 Acquired absence of lung [part of]: Secondary | ICD-10-CM | POA: Insufficient documentation

## 2018-09-12 LAB — CBC WITH DIFFERENTIAL (CANCER CENTER ONLY)
Basophils Absolute: 0 10*3/uL (ref 0.0–0.1)
Basophils Relative: 1 %
Eosinophils Absolute: 0.2 10*3/uL (ref 0.0–0.5)
Eosinophils Relative: 3 %
HEMATOCRIT: 45 % (ref 38.4–49.9)
Hemoglobin: 15.1 g/dL (ref 13.0–17.1)
LYMPHS PCT: 26 %
Lymphs Abs: 1.8 10*3/uL (ref 0.9–3.3)
MCH: 32 pg (ref 27.2–33.4)
MCHC: 33.6 g/dL (ref 32.0–36.0)
MCV: 95.3 fL (ref 79.3–98.0)
MONO ABS: 0.8 10*3/uL (ref 0.1–0.9)
Monocytes Relative: 11 %
NEUTROS ABS: 4.2 10*3/uL (ref 1.5–6.5)
Neutrophils Relative %: 59 %
Platelet Count: 218 10*3/uL (ref 140–400)
RBC: 4.72 MIL/uL (ref 4.20–5.82)
RDW: 14.6 % (ref 11.0–14.6)
WBC: 7.1 10*3/uL (ref 4.0–10.3)

## 2018-09-12 LAB — CMP (CANCER CENTER ONLY)
ALK PHOS: 118 U/L (ref 38–126)
ALT: 26 U/L (ref 0–44)
AST: 27 U/L (ref 15–41)
Albumin: 3.8 g/dL (ref 3.5–5.0)
Anion gap: 9 (ref 5–15)
BILIRUBIN TOTAL: 0.4 mg/dL (ref 0.3–1.2)
BUN: 11 mg/dL (ref 8–23)
CO2: 27 mmol/L (ref 22–32)
Calcium: 9.4 mg/dL (ref 8.9–10.3)
Chloride: 103 mmol/L (ref 98–111)
Creatinine: 0.8 mg/dL (ref 0.61–1.24)
GFR, Est AFR Am: >60 mL/min (ref >60)
Glucose, Bld: 88 mg/dL (ref 70–99)
Potassium: 4.4 mmol/L (ref 3.5–5.1)
Sodium: 139 mmol/L (ref 135–145)
TOTAL PROTEIN: 7.3 g/dL (ref 6.5–8.1)

## 2018-09-12 MED ORDER — IOHEXOL 300 MG/ML  SOLN
75.0000 mL | Freq: Once | INTRAMUSCULAR | Status: AC | PRN
  Administered 2018-09-12: 75 mL via INTRAVENOUS

## 2018-09-14 ENCOUNTER — Ambulatory Visit: Payer: Medicare Other | Admitting: Internal Medicine

## 2018-09-21 ENCOUNTER — Inpatient Hospital Stay: Payer: Medicare Other | Attending: Internal Medicine | Admitting: Internal Medicine

## 2018-09-21 ENCOUNTER — Telehealth: Payer: Self-pay | Admitting: Internal Medicine

## 2018-09-21 ENCOUNTER — Encounter: Payer: Self-pay | Admitting: Internal Medicine

## 2018-09-21 VITALS — BP 139/71 | HR 91 | Temp 98.6°F | Resp 18 | Ht 69.0 in | Wt 212.0 lb

## 2018-09-21 DIAGNOSIS — C3431 Malignant neoplasm of lower lobe, right bronchus or lung: Secondary | ICD-10-CM | POA: Insufficient documentation

## 2018-09-21 DIAGNOSIS — Z79899 Other long term (current) drug therapy: Secondary | ICD-10-CM | POA: Insufficient documentation

## 2018-09-21 DIAGNOSIS — C3491 Malignant neoplasm of unspecified part of right bronchus or lung: Secondary | ICD-10-CM

## 2018-09-21 DIAGNOSIS — C3490 Malignant neoplasm of unspecified part of unspecified bronchus or lung: Secondary | ICD-10-CM

## 2018-09-21 NOTE — Telephone Encounter (Signed)
Gave pt avs and calendar  °

## 2018-09-21 NOTE — Progress Notes (Signed)
[facility] Telephone:([phone]   Fax:(336) [phone]  OFFICE PROGRESS NOTE  Hulan Fess, MD [place] [address]  DIAGNOSIS: Stage IA (T1a, N0, M0) non-small cell lung cancer, adenocarcinoma diagnosed in January 2018  PRIOR THERAPY: Status post right lower lobe superior segmentectomy with lymph node dissection under the care of Dr. Roxan Hockey.  CURRENT THERAPY: Observation.  INTERVAL HISTORY: [name] 72 y.o. male returns to the clinic for six-month follow-up visit accompanied by his wife.  The patient is feeling fine today with no concerning complaints.  He denied having any chest pain, shortness of breath, cough or hemoptysis.  He denied having any fever or chills.  He has no nausea, vomiting, diarrhea or constipation.  He has no recent weight loss or night sweats.  He has no headache or visual changes.  He had repeat CT scan of the chest performed recently and he is here for evaluation and discussion of his discuss results.    MEDICAL HISTORY: Past Medical History:  Diagnosis Date  . Adenocarcinoma of right lung, stage 1 ([facility]) 03/03/2017  . Cancer ([facility])    lung cancer 2005  . Cancer ([facility])    skin cancer, mouth cancer  . COPD (chronic obstructive pulmonary disease) ([facility])   . Glaucoma   . Hereditary and idiopathic peripheral neuropathy 09/26/2014  . Hyperlipidemia   . Hypertension   . Ischemic colitis ([facility])   . Left subclavian vein thrombosis ([facility])   . Polio   . Sleep apnea    no CPAP    ALLERGIES:  is allergic to no known allergies.  MEDICATIONS:  Current Outpatient Medications  Medication Sig Dispense Refill  . atorvastatin (LIPITOR) 10 MG tablet Take 10 mg by mouth every evening.     . carvedilol (COREG) 3.125 MG tablet Take 3.125 mg by mouth 2 (two) times daily with a meal.    . latanoprost (XALATAN) 0.005 % ophthalmic solution Place 1 drop into both eyes at bedtime.    Marland Kitchen losartan (COZAAR) 50 MG tablet Take 50  mg by mouth daily.    . Multiple Vitamin (MULTIVITAMIN WITH MINERALS) TABS tablet Take 1 tablet by mouth daily.    . mupirocin ointment (BACTROBAN) 2 %     . Tiotropium Bromide Monohydrate (SPIRIVA HANDIHALER IN) Inhale 1 Inhaler into the lungs.    . warfarin (COUMADIN) 5 MG tablet Take 1 tablet (5 mg total) by mouth as directed. (Patient taking differently: Take 5-7.5 mg by mouth See admin instructions. Monday, Wednesday, and Friday 7.5 mg (1.5 tablets) & 5 mg (1 tablet) all other days.) 125 tablet 4  . ferrous sulfate 325 (65 FE) MG tablet Take 325 mg by mouth every evening.      No current facility-administered medications for this visit.     SURGICAL HISTORY:  Past Surgical History:  Procedure Laterality Date  . HERNIA REPAIR     left ingunal  . LUNG REMOVAL, PARTIAL Left   . SEGMENTECOMY Right 01/12/2017   Procedure: RIGHT SUPERIOR SEGMENTECTOMY;  Surgeon: Melrose Nakayama, MD;  Location: [facility];  Service: Thoracic;  Laterality: Right;  . SUBCLAVIAN STENT PLACEMENT    . TONSILLECTOMY    . VIDEO ASSISTED THORACOSCOPY Right 01/12/2017   Procedure: VIDEO ASSISTED THORACOSCOPY;  Surgeon: Melrose Nakayama, MD;  Location: [facility];  Service: Thoracic;  Laterality: Right;    REVIEW OF SYSTEMS:  A comprehensive review of systems was negative.   PHYSICAL EXAMINATION: General  appearance: alert, cooperative and no distress Head: Normocephalic, without obvious abnormality, atraumatic Neck: no adenopathy, no JVD, supple, symmetrical, trachea midline and thyroid not enlarged, symmetric, no tenderness/mass/nodules Lymph nodes: Cervical, supraclavicular, and axillary nodes normal. Resp: clear to auscultation bilaterally Back: symmetric, no curvature. ROM normal. No CVA tenderness. Cardio: regular rate and rhythm, S1, S2 normal, no murmur, click, rub or gallop GI: soft, non-tender; bowel sounds normal; no masses,  no organomegaly Extremities: extremities normal, atraumatic, no cyanosis or  edema  ECOG PERFORMANCE STATUS: 1 - Symptomatic but completely ambulatory  Blood pressure 139/71, pulse 91, temperature 98.6 F (37 C), temperature source Oral, resp. rate 18, height 5\' 9"  (1.753 m), weight 212 lb (96.2 kg), SpO2 97 %.  LABORATORY DATA: Lab Results  Component Value Date   WBC 7.1 09/12/2018   HGB 15.1 09/12/2018   HCT 45.0 09/12/2018   MCV 95.3 09/12/2018   PLT 218 09/12/2018      Chemistry      Component Value Date/Time   NA 139 09/12/2018 0951   NA 138 09/07/2017 1051   K 4.4 09/12/2018 0951   K 4.7 09/07/2017 1051   CL 103 09/12/2018 0951   CO2 27 09/12/2018 0951   CO2 28 09/07/2017 1051   BUN 11 09/12/2018 0951   BUN 8.6 09/07/2017 1051   CREATININE 0.80 09/12/2018 0951   CREATININE 0.8 09/07/2017 1051      Component Value Date/Time   CALCIUM 9.4 09/12/2018 0951   CALCIUM 9.5 09/07/2017 1051   ALKPHOS 118 09/12/2018 0951   ALKPHOS 107 09/07/2017 1051   AST 27 09/12/2018 0951   AST 42 (H) 09/07/2017 1051   ALT 26 09/12/2018 0951   ALT 36 09/07/2017 1051   BILITOT 0.4 09/12/2018 0951   BILITOT 0.54 09/07/2017 1051       RADIOGRAPHIC STUDIES: Ct Chest W Contrast  Result Date: 09/12/2018 CLINICAL DATA:  Follow-up lung cancer EXAM: CT CHEST WITH CONTRAST TECHNIQUE: Multidetector CT imaging of the chest was performed during intravenous contrast administration. CONTRAST:  85mL OMNIPAQUE IOHEXOL 300 MG/ML  SOLN COMPARISON:  03/14/2018 FINDINGS: Cardiovascular: The heart is normal in size. No pericardial effusion. No evidence of thoracic aortic aneurysm. Atherosclerotic calcifications of the aortic arch. Coronary atherosclerosis of the LAD and right coronary artery. Mediastinum/Nodes: No suspicious mediastinal lymphadenopathy. Visualized thyroid is unremarkable. Lungs/Pleura: Status post left upper lobectomy. Stable left apical soft tissue, likely pleural-parenchymal scarring. Status post right lower lobe wedge resection. Mild centrilobular and paraseptal  emphysematous changes. No suspicious pulmonary nodules. Stable 4 mm right middle lobe nodule (series 5/image 37), benign. No focal consolidation. No pleural effusion or pneumothorax. Upper Abdomen: Visualized upper abdomen is notable for mild hepatic steatosis and vascular calcifications. Musculoskeletal: Mild degenerative changes of the visualized thoracolumbar spine. Stable left posterolateral rib fracture deformity IMPRESSION: Status post left upper lobectomy and right lower lobe wedge resection. No evidence of recurrent or metastatic disease. Aortic Atherosclerosis (ICD10-I70.0) and Emphysema (ICD10-J43.9). Electronically Signed   By: Julian Hy M.D.   On: 09/12/2018 15:48    ASSESSMENT AND PLAN: This is a very pleasant 72 years old white male with stage IA non-small cell lung cancer, adenocarcinoma status post right lower lobe superior segmentectomy with lymph node dissection in January 2018. He is currently on observation and feeling fine. Repeat CT scan of the chest showed no concerning findings for disease recurrence or progression. I discussed the scan results with the patient recommended for him to continue in observation with repeat CT scan of  the chest in 6 months. He was advised to call immediately if he has any concerning symptoms in the interval. The patient voices understanding of current disease status and treatment options and is in agreement with the current care plan.  All questions were answered. The patient knows to call the clinic with any problems, questions or concerns. We can certainly see the patient much sooner if necessary.  I spent 10 minutes counseling the patient face to face. The total time spent in the appointment was 15 minutes.  Disclaimer: This note was dictated with voice recognition software. Similar sounding words can inadvertently be transcribed and may not be corrected upon review.

## 2018-10-02 ENCOUNTER — Other Ambulatory Visit: Payer: Self-pay | Admitting: Family Medicine

## 2018-10-02 DIAGNOSIS — Z87891 Personal history of nicotine dependence: Secondary | ICD-10-CM

## 2018-10-11 ENCOUNTER — Ambulatory Visit
Admission: RE | Admit: 2018-10-11 | Discharge: 2018-10-11 | Disposition: A | Discharge status: Home / Self Care | Payer: Medicare Other | Source: Ambulatory Visit | Attending: Family Medicine | Admitting: Family Medicine

## 2018-10-11 DIAGNOSIS — Z87891 Personal history of nicotine dependence: Secondary | ICD-10-CM

## 2019-03-21 DEATH — deceased

## 2019-04-11 ENCOUNTER — Ambulatory Visit: Payer: Medicare Other | Admitting: Interventional Cardiology

## 2019-04-20 DEATH — deceased

## 2019-04-24 ENCOUNTER — Telehealth: Payer: Self-pay | Admitting: Cardiology

## 2019-04-24 NOTE — Telephone Encounter (Signed)
Virtual Visit Pre-Appointment Phone Call  "(Name), I am calling you today to discuss your upcoming appointment. We are currently trying to limit exposure to the virus that causes COVID-19 by seeing patients at home rather than in the office."  1. "What is the BEST phone number to call the day of the visit?" - include this in appointment notes  2. Do you have or have access to (through a family member/friend) a smartphone with video capability that we can use for your visit?" a. If yes - list this number in appt notes as cell (if different from BEST phone #) and list the appointment type as a VIDEO visit in appointment notes b. If no - list the appointment type as a PHONE visit in appointment notes  Confirm consent - "In the setting of the current Covid19 crisis, you are scheduled for a (phone or video) visit with your provider on (date) at (time).  Just as we do with many in-office visits, in order for you to participate in this visit, we must obtain consent.  If you'd like, I can send this to your mychart (if signed up) or email for you to review.  Otherwise, I can obtain your verbal consent now.  All virtual visits are billed to your insurance company just like a normal visit would be.  By agreeing to a virtual visit, we'd like you to understand that the technology does not allow for your provider to perform an examination, and thus may limit your provider's ability to fully assess your condition. If your provider identifies any concerns that need to be evaluated in person, we will make arrangements to do so.  Finally, though the technology is pretty good, we cannot assure that it will always work on either your or our end, and in the setting of a video visit, we may have to convert it to a phone-only visit.  In either situation, we cannot ensure that we have a secure connection.  Are you willing to proceed?"   Yes  3. Advise patient to be prepared - "Two hours prior to your appointment, go  ahead and check your blood pressure, pulse, oxygen saturation, and your weight (if you have the equipment to check those) and write them all down. When your visit starts, your provider will ask you for this information. If you have an Apple Watch or Kardia device, please plan to have heart rate information ready on the day of your appointment. Please have a pen and paper handy nearby the day of the visit as well."  4. Give patient instructions for MyChart download to smartphone OR Doximity/[email or web address] as below if video visit (depending on what platform provider is using)  5. Inform patient they will receive a phone call 15 minutes prior to their appointment time (may be from unknown caller ID) so they should be prepared to answer    TELEPHONE CALL NOTE  LELA [name] has been deemed a candidate for a follow-up tele-health visit to limit community exposure during the Covid-19 pandemic. I spoke with the patient via phone to ensure availability of phone/video source, confirm preferred email & phone number, and discuss instructions and expectations.  I reminded [name] to be prepared with any vital sign and/or heart rhythm information that could potentially be obtained via home monitoring, at the time of his visit. I reminded [name] to expect a phone call prior to his visit.  Armando Gang 04/24/2019 12:28 PM   INSTRUCTIONS  FOR DOWNLOADING THE MYCHART APP TO SMARTPHONE  - The patient must first make sure to have activated MyChart and know their login information - If Apple, go to [employer] and type in MyChart in the search bar and download the app. If Android, ask patient to go to [employer] and type in [place] in the search bar and download the app. The app is free but as with any other app downloads, their phone may require them to verify saved payment information or Apple/Android password.  - The patient will need to then log into the app with their MyChart username and  password, and select [facility] as their healthcare provider to link the account. When it is time for your visit, go to the MyChart app, find appointments, and click Begin Video Visit. Be sure to Select Allow for your device to access the Microphone and Camera for your visit. You will then be connected, and your provider will be with you shortly.  **If they have any issues connecting, or need assistance please contact MyChart service desk [phone])**  **If using a computer, in order to ensure the best quality for their visit they will need to use either of the following Internet Browsers: [employer], or Google Chrome**  IF USING DOXIMITY or DOXY.ME - The patient will receive a link just prior to their visit by text.     FULL LENGTH CONSENT FOR TELE-HEALTH VISIT   I hereby voluntarily request, consent and authorize [facility] and its employed or contracted physicians, physician assistants, nurse practitioners or other licensed health care professionals (the Practitioner), to provide me with telemedicine health care services (the Services") as deemed necessary by the treating Practitioner. I acknowledge and consent to receive the Services by the Practitioner via telemedicine. I understand that the telemedicine visit will involve communicating with the Practitioner through live audiovisual communication technology and the disclosure of certain medical information by electronic transmission. I acknowledge that I have been given the opportunity to request an in-person assessment or other available alternative prior to the telemedicine visit and am voluntarily participating in the telemedicine visit.  I understand that I have the right to withhold or withdraw my consent to the use of telemedicine in the course of my care at any time, without affecting my right to future care or treatment, and that the Practitioner or I may terminate the telemedicine visit at any time. I  understand that I have the right to inspect all information obtained and/or recorded in the course of the telemedicine visit and may receive copies of available information for a reasonable fee.  I understand that some of the potential risks of receiving the Services via telemedicine include:   Delay or interruption in medical evaluation due to technological equipment failure or disruption;  Information transmitted may not be sufficient (e.g. poor resolution of images) to allow for appropriate medical decision making by the Practitioner; and/or   In rare instances, security protocols could fail, causing a breach of personal health information.  Furthermore, I acknowledge that it is my responsibility to provide information about my medical history, conditions and care that is complete and accurate to the best of my ability. I acknowledge that Practitioner's advice, recommendations, and/or decision may be based on factors not within their control, such as incomplete or inaccurate data provided by me or distortions of diagnostic images or specimens that may result from electronic transmissions. I understand that the practice of medicine is not an exact science and  that Practitioner makes no warranties or guarantees regarding treatment outcomes. I acknowledge that I will receive a copy of this consent concurrently upon execution via email to the email address I last provided but may also request a printed copy by calling the office of [facility].    I understand that my insurance will be billed for this visit.   I have read or had this consent read to me.  I understand the contents of this consent, which adequately explains the benefits and risks of the Services being provided via telemedicine.   I have been provided ample opportunity to ask questions regarding this consent and the Services and have had my questions answered to my satisfaction.  I give my informed consent for the services to be  provided through the use of telemedicine in my medical care  By participating in this telemedicine visit I agree to the above.

## 2019-04-25 ENCOUNTER — Encounter

## 2019-04-25 ENCOUNTER — Encounter: Payer: Self-pay | Admitting: Cardiology

## 2019-04-25 ENCOUNTER — Other Ambulatory Visit: Payer: Self-pay

## 2019-04-25 ENCOUNTER — Telehealth (INDEPENDENT_AMBULATORY_CARE_PROVIDER_SITE_OTHER): Payer: Medicare Other | Admitting: Cardiology

## 2019-04-25 VITALS — BP 124/75 | HR 92 | Ht 69.0 in | Wt 210.0 lb

## 2019-04-25 DIAGNOSIS — R002 Palpitations: Secondary | ICD-10-CM | POA: Diagnosis not present

## 2019-04-25 DIAGNOSIS — Z7189 Other specified counseling: Secondary | ICD-10-CM

## 2019-04-25 DIAGNOSIS — G4733 Obstructive sleep apnea (adult) (pediatric): Secondary | ICD-10-CM | POA: Diagnosis not present

## 2019-04-25 DIAGNOSIS — R0602 Shortness of breath: Secondary | ICD-10-CM

## 2019-04-25 DIAGNOSIS — I10 Essential (primary) hypertension: Secondary | ICD-10-CM | POA: Diagnosis not present

## 2019-04-25 NOTE — Patient Instructions (Addendum)
Medication Instructions:  Your physician recommends that you continue on your current medications as directed. Please refer to the Current Medication list given to you today.  If you need a refill on your cardiac medications before your next appointment, please call your pharmacy.   Lab work: None If you have labs (blood work) drawn today and your tests are completely normal, you will receive your results only by: Marland Kitchen MyChart Message (if you have MyChart) OR . A paper copy in the mail If you have any lab test that is abnormal or we need to change your treatment, we will call you to review the results.  Testing/Procedures: Your physician has requested that you have an echocardiogram. Echocardiography is a painless test that uses sound waves to create images of your heart. It provides your doctor with information about the size and shape of your heart and how well your heart's chambers and valves are working. This procedure takes approximately one hour. There are no restrictions for this procedure.  Follow-Up: As needed, pending results.

## 2019-04-25 NOTE — Progress Notes (Signed)
Virtual Visit via Video Note   This visit type was conducted due to national recommendations for restrictions regarding the COVID-19 Pandemic (e.g. social distancing) in an effort to limit this patient's exposure and mitigate transmission in our community.  Due to his co-morbid illnesses, this patient is at least at moderate risk for complications without adequate follow up.  This format is felt to be most appropriate for this patient at this time.  All issues noted in this document were discussed and addressed.  A limited physical exam was performed with this format.  Please refer to the patient's chart for his consent to telehealth for [facility].     Evaluation Performed:  Cardiology Consult  This visit type was conducted due to national recommendations for restrictions regarding the COVID-19 Pandemic (e.g. social distancing).  This format is felt to be most appropriate for this patient at this time.  All issues noted in this document were discussed and addressed.  No physical exam was performed (except for noted visual exam findings with Video Visits).  Please refer to the patient's chart (MyChart message for video visits and phone note for telephone visits) for the patient's consent to telehealth for [facility].  Date:  04/25/2019   ID:  [name], DOB 11/16/1946, MRN [id number]  Patient Location:  Home  Provider location:   [place]  PCP:  Hulan Fess, MD  Cardiologist:  NEW Electrophysiologist:  None   Chief Complaint: palpitations  History of Present Illness:    [name] is a 73 y.o. male who presents via audio/video conferencing for a telehealth visit today in referral from Hulan Fess, MD for evaluation of palpitations.   This is a 73yo male with a history of OSA, lung CA, COPD, HTN, hyperlipidemia and OSA.  He was seen remotely by me back in 2016 and underwent home sleep study which revealed moderate OSA with an AHI of 16.4/hr.  A CPAP titration was  recommended but he never followed through. He is now referred today for evaluation of palpitations.  He is here today for followup and is doing well.  He denies any chest pain or pressure, SOB, DOE, PND, orthopnea, LE edema, dizziness, palpitations or syncope. He is compliant with his meds and is tolerating meds with no SE.    The patient does not have symptoms concerning for COVID-19 infection (fever, chills, cough, or new shortness of breath).   Prior CV studies:   The following studies were reviewed today:  none  Past Medical History:  Diagnosis Date  . Adenocarcinoma of right lung, stage 1 ([place]) 03/03/2017  . Cancer ([facility])    lung cancer 2005  . Cancer ([place])    skin cancer, mouth cancer  . COPD (chronic obstructive pulmonary disease) ([place])   . Glaucoma   . Hereditary and idiopathic peripheral neuropathy 09/26/2014  . Hyperlipidemia   . Hypertension   . Ischemic colitis ([place])   . Left subclavian vein thrombosis ([place])   . Polio   . Sleep apnea    no CPAP   Past Surgical History:  Procedure Laterality Date  . HERNIA REPAIR     left ingunal  . LUNG REMOVAL, PARTIAL Left   . SEGMENTECOMY Right 01/12/2017   Procedure: RIGHT SUPERIOR SEGMENTECTOMY;  Surgeon: Melrose Nakayama, MD;  Location: [place];  Service: Thoracic;  Laterality: Right;  . SUBCLAVIAN STENT PLACEMENT    . TONSILLECTOMY    . VIDEO ASSISTED THORACOSCOPY Right 01/12/2017   Procedure: VIDEO ASSISTED  THORACOSCOPY;  Surgeon: Melrose Nakayama, MD;  [facility];  Service: Thoracic;  Laterality: Right;     Current Meds  Medication Sig  . atorvastatin (LIPITOR) 10 MG tablet Take 10 mg by mouth every evening.   . carvedilol (COREG) 3.125 MG tablet Take 3.125 mg by mouth 2 (two) times daily with a meal.  . latanoprost (XALATAN) 0.005 % ophthalmic solution Place 1 drop into both eyes at bedtime.  Marland Kitchen losartan (COZAAR) 50 MG tablet Take 50 mg by mouth daily.  . Multiple Vitamin (MULTIVITAMIN WITH MINERALS) TABS  tablet Take 1 tablet by mouth daily.  . Tiotropium Bromide Monohydrate (SPIRIVA HANDIHALER IN) Inhale 1 Inhaler into the lungs.  . warfarin (COUMADIN) 5 MG tablet Take 1 tablet (5 mg total) by mouth as directed. (Patient taking differently: Take 5-7.5 mg by mouth See admin instructions. Monday, Wednesday, and Friday 7.5 mg (1.5 tablets) & 5 mg (1 tablet) all other days.)     Allergies:   No known allergies   Social History   Tobacco Use  . Smoking status: Former Smoker    Packs/day: 2.00    Years: 40.00    Pack years: 80.00    Types: Cigarettes    Last attempt to quit: 2004    Years since quitting: 16.3  . Smokeless tobacco: Never Used  Substance Use Topics  . Alcohol use: Yes    Alcohol/week: 42.0 standard drinks    Types: 42 Glasses of wine per week    Comment: night- Vodka 4 to five drinks a night  . Drug use: No     Family Hx: The patient's family history includes Cancer in his mother; Colon cancer in his maternal aunt and maternal grandmother; Heart Problems in his brother; Heart attack in his father; Heart attack (age of onset: 18) in his brother; Heart disease in his father; Hyperlipidemia in his mother.  ROS:   Please see the history of present illness.     All other systems reviewed and are negative.   Labs/Other Tests and Data Reviewed:    Recent Labs: 09/12/2018: ALT 26; BUN 11; Creatinine 0.80; Hemoglobin 15.1; Platelet Count 218; Potassium 4.4; Sodium 139   Recent Lipid Panel No results found for: CHOL, TRIG, HDL, CHOLHDL, LDLCALC, LDLDIRECT  Wt Readings from Last 3 Encounters:  04/25/19 210 lb (95.3 kg)  09/21/18 212 lb (96.2 kg)  03/16/18 221 lb 12.8 oz (100.6 kg)     Objective:    Vital Signs:  BP 124/75   Pulse 92   Ht 5\' 9"  (1.753 m)   Wt 210 lb (95.3 kg)   BMI 31.01 kg/m    CONSTITUTIONAL:  Well nourished, well developed male in no acute distress.  EYES: anicteric MOUTH: oral mucosa is pink RESPIRATORY: Normal respiratory effort,  symmetric expansion CARDIOVASCULAR: No peripheral edema SKIN: No rash, lesions or ulcers MUSCULOSKELETAL: no digital cyanosis NEURO: Cranial Nerves II-XII grossly intact, moves all extremities PSYCH: Intact judgement and insight.  A&O x 3, Mood/affect appropriate   ASSESSMENT & PLAN:    1.  Palpitations -he was having palpitations back in the winter and then his PCP told him that he needed to stop drinking vodka which he was drinking a lot of.  On 04/06/2019 he stopped drinking vodka and since then has had no further palpitations.    2.  OSA - moderate by home sleep study in 2016.  He never followed up for CPAP titration. He is not interested in pursuing CPAP further.  3.  Hypertension -he says that his blood pressures have been actually well controlled at home after he stopped drinking vodka so much.  He is actually completely stopped drinking it.Marland Kitchen  He will continue on Carvedilol 3.125mg  BID.  4.  Shortness of breath -this is a chronic problem for him related to his COPD as well as prior lung CA with lung resection.  He says the shortness of breath has not worsened from what it normally is at baseline.  Given his significant alcohol intake in the past I am going to get a 2D echocardiogram to get a baseline LV function assessment to make sure he has not developed alcoholic cardiomyopathy.  5.  COVID-19 Education:The signs and symptoms of COVID-19 were discussed with the patient and how to seek care for testing (follow up with PCP or arrange E-visit).  The importance of social distancing was discussed today.  Patient Risk:   After full review of this patient's clinical status, I feel that they are at least moderate risk at this time.  Time:   Today, I have spent 20 minutes directly with the patient on video discussing medical problems including Palpitations, OSA and HTN.  We also reviewed the symptoms of COVID 19 and the ways to protect against contracting the virus with telehealth technology.   I spent an additional 5 minutes reviewing patient's chart including reviewing prior sleep study from 2016.  Medication Adjustments/Labs and Tests Ordered: Current medicines are reviewed at length with the patient today.  Concerns regarding medicines are outlined above.  Tests Ordered: No orders of the defined types were placed in this encounter.  Medication Changes: No orders of the defined types were placed in this encounter.   Disposition:  Follow up prn  Signed, Fransico Him, MD  04/25/2019 2:16 PM    [facility]

## 2019-05-18 ENCOUNTER — Telehealth (HOSPITAL_COMMUNITY): Payer: Self-pay | Admitting: Cardiology

## 2019-05-18 NOTE — Telephone Encounter (Signed)

## 2019-05-21 ENCOUNTER — Other Ambulatory Visit: Payer: Self-pay

## 2019-05-21 ENCOUNTER — Ambulatory Visit (HOSPITAL_COMMUNITY): Payer: Medicare Other | Attending: Cardiology

## 2019-05-21 DIAGNOSIS — R0602 Shortness of breath: Secondary | ICD-10-CM | POA: Insufficient documentation

## 2019-05-22 ENCOUNTER — Telehealth: Payer: Medicare Other | Admitting: Cardiology

## 2019-06-01 NOTE — Telephone Encounter (Signed)
Error

## 2019-09-21 ENCOUNTER — Encounter (HOSPITAL_COMMUNITY): Payer: Self-pay

## 2019-09-21 ENCOUNTER — Ambulatory Visit (HOSPITAL_COMMUNITY)
Admission: RE | Admit: 2019-09-21 | Discharge: 2019-09-21 | Disposition: A | Discharge status: Home / Self Care | Payer: Medicare Other | Source: Ambulatory Visit | Attending: Internal Medicine | Admitting: Internal Medicine

## 2019-09-21 ENCOUNTER — Inpatient Hospital Stay: Payer: Medicare Other | Attending: Internal Medicine

## 2019-09-21 ENCOUNTER — Other Ambulatory Visit: Payer: Self-pay

## 2019-09-21 DIAGNOSIS — C3490 Malignant neoplasm of unspecified part of unspecified bronchus or lung: Secondary | ICD-10-CM | POA: Diagnosis not present

## 2019-09-21 DIAGNOSIS — C3431 Malignant neoplasm of lower lobe, right bronchus or lung: Secondary | ICD-10-CM | POA: Insufficient documentation

## 2019-09-21 LAB — CBC WITH DIFFERENTIAL (CANCER CENTER ONLY)
Abs Immature Granulocytes: 0.01 10*3/uL (ref 0.00–0.07)
Basophils Absolute: 0.1 10*3/uL (ref 0.0–0.1)
Basophils Relative: 1 %
Eosinophils Absolute: 0.2 10*3/uL (ref 0.0–0.5)
Eosinophils Relative: 3 %
HCT: 47.4 % (ref 39.0–52.0)
Hemoglobin: 16 g/dL (ref 13.0–17.0)
Immature Granulocytes: 0 %
Lymphocytes Relative: 21 %
Lymphs Abs: 1.4 10*3/uL (ref 0.7–4.0)
MCH: 31.3 pg (ref 26.0–34.0)
MCHC: 33.8 g/dL (ref 30.0–36.0)
MCV: 92.8 fL (ref 80.0–100.0)
Monocytes Absolute: 0.8 10*3/uL (ref 0.1–1.0)
Monocytes Relative: 12 %
Neutro Abs: 4.2 10*3/uL (ref 1.7–7.7)
Neutrophils Relative %: 63 %
Platelet Count: 188 10*3/uL (ref 150–400)
RBC: 5.11 MIL/uL (ref 4.22–5.81)
RDW: 14.6 % (ref 11.5–15.5)
WBC Count: 6.6 10*3/uL (ref 4.0–10.5)
nRBC: 0 % (ref 0.0–0.2)

## 2019-09-21 LAB — CMP (CANCER CENTER ONLY)
ALT: 32 U/L (ref 0–44)
AST: 39 U/L (ref 15–41)
Albumin: 4.1 g/dL (ref 3.5–5.0)
Alkaline Phosphatase: 109 U/L (ref 38–126)
Anion gap: 10 (ref 5–15)
BUN: 9 mg/dL (ref 8–23)
CO2: 25 mmol/L (ref 22–32)
Calcium: 9.7 mg/dL (ref 8.9–10.3)
Chloride: 105 mmol/L (ref 98–111)
Creatinine: 0.69 mg/dL (ref 0.61–1.24)
GFR, Est AFR Am: >60 mL/min (ref >60)
GFR, Estimated: >60 mL/min (ref >60)
Glucose, Bld: 91 mg/dL (ref 70–99)
Potassium: 3.9 mmol/L (ref 3.5–5.1)
Sodium: 140 mmol/L (ref 135–145)
Total Bilirubin: 0.7 mg/dL (ref 0.3–1.2)
Total Protein: 7.3 g/dL (ref 6.5–8.1)

## 2019-09-21 MED ORDER — IOHEXOL 300 MG/ML  SOLN
75.0000 mL | Freq: Once | INTRAMUSCULAR | Status: AC | PRN
  Administered 2019-09-21: 13:00:00 75 mL via INTRAVENOUS

## 2019-09-21 MED ORDER — SODIUM CHLORIDE (PF) 0.9 % IJ SOLN
INTRAMUSCULAR | Status: AC
  Filled 2019-09-21: qty 50

## 2019-09-25 ENCOUNTER — Other Ambulatory Visit: Payer: Self-pay

## 2019-09-25 ENCOUNTER — Inpatient Hospital Stay (HOSPITAL_BASED_OUTPATIENT_CLINIC_OR_DEPARTMENT_OTHER): Payer: Medicare Other | Admitting: Internal Medicine

## 2019-09-25 ENCOUNTER — Encounter: Payer: Self-pay | Admitting: Internal Medicine

## 2019-09-25 VITALS — BP 140/77 | HR 88 | Temp 98.5°F | Resp 17 | Ht 69.0 in | Wt 213.8 lb

## 2019-09-25 DIAGNOSIS — C3491 Malignant neoplasm of unspecified part of right bronchus or lung: Secondary | ICD-10-CM

## 2019-09-25 DIAGNOSIS — I10 Essential (primary) hypertension: Secondary | ICD-10-CM

## 2019-09-25 DIAGNOSIS — C3490 Malignant neoplasm of unspecified part of unspecified bronchus or lung: Secondary | ICD-10-CM

## 2019-09-25 DIAGNOSIS — C3431 Malignant neoplasm of lower lobe, right bronchus or lung: Secondary | ICD-10-CM | POA: Diagnosis not present

## 2019-09-25 NOTE — Progress Notes (Signed)
[place] Telephone:([phone]) [phone]   Fax:(336) [phone]  OFFICE PROGRESS NOTE  Hulan Fess, MD [place] [address]  DIAGNOSIS: Stage IA (T1a, N0, M0) non-small cell lung cancer, adenocarcinoma diagnosed in January 2018  PRIOR THERAPY: Status post right lower lobe superior segmentectomy with lymph node dissection under the care of Dr. Roxan Hockey.  CURRENT THERAPY: Observation.  INTERVAL HISTORY: [name] 73 y.o. male returns to the clinic today for follow-up visit.  The patient is feeling fine today with no concerning complaints.  He denied having any chest pain, shortness of breath, cough or hemoptysis.  He denied having any fever or chills.  He has no nausea, vomiting, diarrhea or constipation.  He has no recent weight loss or night sweats.  He is here today for evaluation with repeat CT scan of the chest for restaging of his disease.  MEDICAL HISTORY: Past Medical History:  Diagnosis Date  . Adenocarcinoma of right lung, stage 1 ([place]) 03/03/2017  . Cancer ([place])    lung cancer 2005  . Cancer ([place])    skin cancer, mouth cancer  . COPD (chronic obstructive pulmonary disease) ([place])   . Glaucoma   . Hereditary and idiopathic peripheral neuropathy 09/26/2014  . Hyperlipidemia   . Hypertension   . Ischemic colitis ([place])   . Left subclavian vein thrombosis ([place])   . Polio   . Sleep apnea    no CPAP    ALLERGIES:  is allergic to no known allergies.  MEDICATIONS:  Current Outpatient Medications  Medication Sig Dispense Refill  . atorvastatin (LIPITOR) 10 MG tablet Take 10 mg by mouth every evening.     . carvedilol (COREG) 3.125 MG tablet Take 3.125 mg by mouth 2 (two) times daily with a meal.    . latanoprost (XALATAN) 0.005 % ophthalmic solution Place 1 drop into both eyes at bedtime.    Marland Kitchen losartan (COZAAR) 50 MG tablet Take 50 mg by mouth daily.    . Multiple Vitamin (MULTIVITAMIN WITH MINERALS) TABS tablet Take 1 tablet by  mouth daily.    . Tiotropium Bromide Monohydrate (SPIRIVA HANDIHALER IN) Inhale 1 Inhaler into the lungs.    . warfarin (COUMADIN) 5 MG tablet Take 1 tablet (5 mg total) by mouth as directed. (Patient taking differently: Take 5-7.5 mg by mouth See admin instructions. Monday, Wednesday, and Friday 7.5 mg (1.5 tablets) & 5 mg (1 tablet) all other days.) 125 tablet 4   No current facility-administered medications for this visit.     SURGICAL HISTORY:  Past Surgical History:  Procedure Laterality Date  . HERNIA REPAIR     left ingunal  . LUNG REMOVAL, PARTIAL Left   . SEGMENTECOMY Right 01/12/2017   Procedure: RIGHT SUPERIOR SEGMENTECTOMY;  Surgeon: Melrose Nakayama, MD;  Location: [place];  Service: Thoracic;  Laterality: Right;  . SUBCLAVIAN STENT PLACEMENT    . TONSILLECTOMY    . VIDEO ASSISTED THORACOSCOPY Right 01/12/2017   Procedure: VIDEO ASSISTED THORACOSCOPY;  Surgeon: Melrose Nakayama, MD;  Location: [place];  Service: Thoracic;  Laterality: Right;    REVIEW OF SYSTEMS:  A comprehensive review of systems was negative.   PHYSICAL EXAMINATION: General appearance: alert, cooperative and no distress Head: Normocephalic, without obvious abnormality, atraumatic Neck: no adenopathy, no JVD, supple, symmetrical, trachea midline and thyroid not enlarged, symmetric, no tenderness/mass/nodules Lymph nodes: Cervical, supraclavicular, and axillary nodes normal. Resp: clear to auscultation bilaterally Back: symmetric, no curvature. ROM normal.  No CVA tenderness. Cardio: regular rate and rhythm, S1, S2 normal, no murmur, click, rub or gallop GI: soft, non-tender; bowel sounds normal; no masses,  no organomegaly Extremities: extremities normal, atraumatic, no cyanosis or edema  ECOG PERFORMANCE STATUS: 1 - Symptomatic but completely ambulatory  Blood pressure 140/77, pulse 88, temperature 98.5 F (36.9 C), temperature source Temporal, resp. rate 17, height 5\' 9"  (1.753 m), weight 213 lb  12.8 oz (97 kg), SpO2 97 %.  LABORATORY DATA: Lab Results  Component Value Date   WBC 6.6 09/21/2019   HGB 16.0 09/21/2019   HCT 47.4 09/21/2019   MCV 92.8 09/21/2019   PLT 188 09/21/2019      Chemistry      Component Value Date/Time   NA 140 09/21/2019 1033   NA 138 09/07/2017 1051   K 3.9 09/21/2019 1033   K 4.7 09/07/2017 1051   CL 105 09/21/2019 1033   CO2 25 09/21/2019 1033   CO2 28 09/07/2017 1051   BUN 9 09/21/2019 1033   BUN 8.6 09/07/2017 1051   CREATININE 0.69 09/21/2019 1033   CREATININE 0.8 09/07/2017 1051      Component Value Date/Time   CALCIUM 9.7 09/21/2019 1033   CALCIUM 9.5 09/07/2017 1051   ALKPHOS 109 09/21/2019 1033   ALKPHOS 107 09/07/2017 1051   AST 39 09/21/2019 1033   AST 42 (H) 09/07/2017 1051   ALT 32 09/21/2019 1033   ALT 36 09/07/2017 1051   BILITOT 0.7 09/21/2019 1033   BILITOT 0.54 09/07/2017 1051       RADIOGRAPHIC STUDIES: Ct Chest W Contrast  Result Date: 09/21/2019 CLINICAL DATA:  History of malignant neoplasm of the chest, non-small cell lung cancer prior partial lung resection on the right and lymph node dissection EXAM: CT CHEST WITH CONTRAST TECHNIQUE: Multidetector CT imaging of the chest was performed during intravenous contrast administration. CONTRAST:  47mL OMNIPAQUE IOHEXOL 300 MG/ML  SOLN COMPARISON:  09/12/2018 FINDINGS: Cardiovascular: Moderate-to-marked calcific and noncalcific atherosclerotic the thoracic aorta. Stable appearance of the thoracic aorta with calcified coronary artery disease as well. No pericardial effusion. Heart size is stable and central pulmonary vasculature is unremarkable. Distortion of left hilum as before due to changes of left upper lobectomy. Post left axillary stenting as before. Mediastinum/Nodes: Shift of mediastinal structures slightly to the left following partial lung resection. No signs of mediastinal lymphadenopathy. No hilar lymphadenopathy. No axillary or supraclavicular adenopathy.  Lungs/Pleura: Signs of pulmonary emphysema are redemonstrated with postoperative changes of left upper lobectomy and right lower lobe segmentectomy. Basilar scarring on the left as before. Scarring along postoperative changes in the right chest unchanged. No signs of consolidation or pleural effusion. No suspicious mass or nodule. Upper Abdomen: Low-density of the liver is suggestive of background hepatic steatosis, no focal lesion. Signs of fatty sparing along the gallbladder fossa. No acute findings in the upper abdomen. Musculoskeletal: Postoperative changes related to left thoracotomy in the left chest wall with he will left-sided ribs. Irregularity of left first second and third ribs following left upper lobectomy with pleural thickening in this location is similar to the previous exam. Unchanged dating back to March of 2019 IMPRESSION: Postoperative and post treatment changes in the left and right chest, no signs of disease recurrence. Hepatic steatosis. Aortic Atherosclerosis (ICD10-I70.0) and Emphysema (ICD10-J43.9). Electronically Signed   By: Zetta Bills M.D.   On: 09/21/2019 13:50    ASSESSMENT AND PLAN: This is a very pleasant 73 years old white male with stage IA non-small cell  lung cancer, adenocarcinoma status post right lower lobe superior segmentectomy with lymph node dissection in January 2018. The patient has been on observation since that time and he has no concerning complaints. He had repeat CT scan of the chest performed recently.  I personally and independently reviewed the scans and discussed the results with the patient today. His scan showed no concerning findings for disease recurrence or metastasis. I recommended for the patient to continue on observation with repeat CT scan of the chest in 1 year. The patient was advised to call immediately if he has any concerning symptoms in the interval. The patient voices understanding of current disease status and treatment options and  is in agreement with the current care plan.  All questions were answered. The patient knows to call the clinic with any problems, questions or concerns. We can certainly see the patient much sooner if necessary.  I spent 10 minutes counseling the patient face to face. The total time spent in the appointment was 15 minutes.  Disclaimer: This note was dictated with voice recognition software. Similar sounding words can inadvertently be transcribed and may not be corrected upon review.

## 2019-09-26 ENCOUNTER — Telehealth: Payer: Self-pay | Admitting: Internal Medicine

## 2019-09-26 NOTE — Telephone Encounter (Signed)
Scheduled appt per 10/6 los - mailed reminder letter with appt date and time

## 2019-12-24 ENCOUNTER — Ambulatory Visit: Payer: Medicare Other | Attending: Internal Medicine

## 2019-12-24 DIAGNOSIS — Z20822 Contact with and (suspected) exposure to covid-19: Secondary | ICD-10-CM

## 2019-12-25 LAB — NOVEL CORONAVIRUS, NAA: SARS-CoV-2, NAA: NOT DETECTED

## 2019-12-26 ENCOUNTER — Telehealth: Payer: Self-pay | Admitting: Family Medicine

## 2019-12-26 NOTE — Telephone Encounter (Signed)
Negative COVID results given. Patient results "NOT Detected." Caller expressed understanding. ° °

## 2020-01-18 ENCOUNTER — Ambulatory Visit: Payer: Medicare Other

## 2020-01-24 ENCOUNTER — Ambulatory Visit: Payer: Medicare Other | Attending: Internal Medicine

## 2020-01-24 DIAGNOSIS — Z23 Encounter for immunization: Secondary | ICD-10-CM

## 2020-01-24 NOTE — Progress Notes (Signed)
   Covid-19 Vaccination Clinic  Name:  [name]    MRN: [id number] DOB: [birth year]  01/24/2020  Mr. [name] was observed post Covid-19 immunization for 15 minutes without incidence. He was provided with Vaccine Information Sheet and instruction to access the V-Safe system.   Mr. [name] was instructed to call 911 with any severe reactions post vaccine: Marland Kitchen Difficulty breathing  . Swelling of your face and throat  . A fast heartbeat  . A bad rash all over your body  . Dizziness and weakness    Immunizations Administered    Name Date Dose VIS Date Route   Pfizer COVID-19 Vaccine 01/24/2020  3:42 PM 0.3 mL 11/30/2019 Intramuscular   Manufacturer: [place]   Lot: [id number]   [place]: [id number]

## 2020-02-18 ENCOUNTER — Ambulatory Visit: Payer: Medicare Other | Attending: Internal Medicine

## 2020-02-18 DIAGNOSIS — Z23 Encounter for immunization: Secondary | ICD-10-CM

## 2020-02-18 NOTE — Progress Notes (Signed)
   Covid-19 Vaccination Clinic  Name:  FREDDY KINNE    MRN: [id number] DOB: Jul 29, 1946  02/18/2020  Mr. Leavitt was observed post Covid-19 immunization for 15 minutes without incidence. He was provided with Vaccine Information Sheet and instruction to access the V-Safe system.   Mr. Corpus was instructed to call 911 with any severe reactions post vaccine: Marland Kitchen Difficulty breathing  . Swelling of your face and throat  . A fast heartbeat  . A bad rash all over your body  . Dizziness and weakness    Immunizations Administered    Name Date Dose VIS Date Route   Pfizer COVID-19 Vaccine 02/18/2020  2:49 PM 0.3 mL 11/30/2019 Intramuscular   Manufacturer: [place]   Lot: [id number]   [place]: [address]-2

## 2020-03-28 ENCOUNTER — Encounter (HOSPITAL_BASED_OUTPATIENT_CLINIC_OR_DEPARTMENT_OTHER): Payer: Medicare Other | Attending: Internal Medicine | Admitting: Internal Medicine

## 2020-03-28 ENCOUNTER — Other Ambulatory Visit: Payer: Self-pay

## 2020-03-28 DIAGNOSIS — L97511 Non-pressure chronic ulcer of other part of right foot limited to breakdown of skin: Secondary | ICD-10-CM | POA: Diagnosis not present

## 2020-03-28 DIAGNOSIS — T451X5A Adverse effect of antineoplastic and immunosuppressive drugs, initial encounter: Secondary | ICD-10-CM | POA: Insufficient documentation

## 2020-03-28 DIAGNOSIS — G620 Drug-induced polyneuropathy: Secondary | ICD-10-CM | POA: Diagnosis not present

## 2020-03-28 DIAGNOSIS — Z85118 Personal history of other malignant neoplasm of bronchus and lung: Secondary | ICD-10-CM | POA: Insufficient documentation

## 2020-03-28 DIAGNOSIS — G4730 Sleep apnea, unspecified: Secondary | ICD-10-CM | POA: Diagnosis not present

## 2020-03-28 DIAGNOSIS — I10 Essential (primary) hypertension: Secondary | ICD-10-CM | POA: Diagnosis not present

## 2020-03-28 DIAGNOSIS — Z85819 Personal history of malignant neoplasm of unspecified site of lip, oral cavity, and pharynx: Secondary | ICD-10-CM | POA: Diagnosis not present

## 2020-03-28 DIAGNOSIS — L97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 with unspecified severity: Secondary | ICD-10-CM | POA: Diagnosis present

## 2020-03-28 DIAGNOSIS — J449 Chronic obstructive pulmonary disease, unspecified: Secondary | ICD-10-CM | POA: Insufficient documentation

## 2020-03-28 DIAGNOSIS — Z86718 Personal history of other venous thrombosis and embolism: Secondary | ICD-10-CM | POA: Insufficient documentation

## 2020-03-28 NOTE — Progress Notes (Signed)
Caleb, Everett ([id number]) Visit Report for 03/28/2020 Abuse/Suicide Risk Screen Details Patient Name: Date of Service: [name], [name] 03/28/2020 2:45 PM Medical Record [id number] Patient Account Number: [id number] Date of Birth/Sex: Treating RN: [birth year] (74 y.o. [name] Primary Care Claudean Leavelle: Nicholes Stairs Other Clinician: Referring Telly Jawad: Treating Justice Milliron/Extender:Robson, Vickey Sages, Dorothe Pea in Treatment: 0 Abuse/Suicide Risk Screen Items Answer ABUSE RISK SCREEN: Has anyone close to you tried to hurt or harm you recentlyo No Do you feel uncomfortable with anyone in your familyo No Has anyone forced you do things that you didnt want to doo No Electronic Signature(s) Signed: 03/28/2020 6:10:03 PM By: Baruch Gouty RN, BSN Entered By: Baruch Gouty on 03/28/2020 15:27:06 -------------------------------------------------------------------------------- Activities of Daily Living Details Patient Name: Date of Service: Caleb, Everett 03/28/2020 2:45 PM Medical Record [id number] Patient Account Number: [id number] Date of Birth/Sex: Treating RN: [birth year] (74 y.o. [name] Primary Care Rahima Fleishman: Nicholes Stairs Other Clinician: Referring Nichols Corter: Treating Essex Perry/Extender:Robson, Vickey Sages, Dorothe Pea in Treatment: 0 Activities of Daily Living Items Answer Activities of Daily Living (Please select one for each item) Drive Automobile Completely Able Take Medications Completely Able Use Telephone Completely Able Care for Appearance Completely Able Use Toilet Completely Able Bath / Shower Completely Able Dress Self Completely Able Feed Self Completely Able Walk Completely Able Get In / Out Bed Completely Able Housework Completely Able Prepare Meals Completely Able Handle Money Completely Able Shop for Self Completely Able Electronic Signature(s) Signed: 03/28/2020 6:10:03 PM By: Baruch Gouty RN, BSN Entered  By: Baruch Gouty on 03/28/2020 15:27:25 -------------------------------------------------------------------------------- Education Screening Details Patient Name: Date of Service: [name] 03/28/2020 2:45 PM Medical Record [id number] Patient Account Number: [id number] Date of Birth/Sex: Treating RN: February 25, 1946 (74 y.o. [name] Primary Care Deantre Bourdon: Nicholes Stairs Other Clinician: Referring Yanni Ruberg: Treating Shayan Bramhall/Extender:Robson, Vickey Sages, Dorothe Pea in Treatment: 0 Primary Learner Assessed: Patient Learning Preferences/Education Level/Primary Language Learning Preference: Explanation, Demonstration, Printed Material Highest Education Level: College or Above Preferred Language: English Cognitive Barrier Language Barrier: No Translator Needed: No Memory Deficit: No Emotional Barrier: No Cultural/Religious Beliefs Affecting Medical Care: No Physical Barrier Impaired Vision: Yes Glasses Impaired Hearing: No Decreased Hand dexterity: No Knowledge/Comprehension Knowledge Level: High Comprehension Level: High Ability to understand written High instructions: Ability to understand verbal High instructions: Motivation Anxiety Level: Calm Cooperation: Cooperative Education Importance: Acknowledges Need Interest in Health Problems: Asks Questions Perception: Coherent Willingness to Engage in Self- High Management Activities: Readiness to Engage in Self- High Management Activities: Electronic Signature(s) Signed: 03/28/2020 6:10:03 PM By: Baruch Gouty RN, BSN Entered By: Baruch Gouty on 03/28/2020 15:27:49 -------------------------------------------------------------------------------- Fall Risk Assessment Details Patient Name: Date of Service: [name] 03/28/2020 2:45 PM Medical Record [id number] Patient Account Number: [id number] Date of Birth/Sex: Treating RN: 04-Sep-1946 (74 y.o. [name] Primary Care  Bess Saltzman: Nicholes Stairs Other Clinician: Referring Chiyo Fay: Treating Jhostin Epps/Extender:Robson, Vickey Sages, Dorothe Pea in Treatment: 0 Fall Risk Assessment Items Have you had 2 or more falls in the last 12 monthso 0 No Have you had any fall that resulted in injury in the last 12 monthso 0 No FALLS RISK SCREEN History of falling - immediate or within 3 months 0 No Secondary diagnosis (Do you have 2 or more medical diagnoseso) 0 No Ambulatory aid None/bed rest/wheelchair/nurse 0 Yes Crutches/cane/walker 0 No Furniture 0 No Intravenous therapy Access/Saline/Heparin Lock 0 No Weak (short steps with or without shuffle, stooped but able to lift head 0  No while walking, may seek support from furniture) Impaired (short steps with shuffle, may have difficulty arising from chair, 0 No head down, impaired balance) Mental Status Oriented to own ability 0 Yes Overestimates or forgets limitations 0 No Risk Level: Low Risk Score: 0 Electronic Signature(s) Signed: 03/28/2020 6:10:03 PM By: Baruch Gouty RN, BSN Entered By: Baruch Gouty on 03/28/2020 15:28:07 -------------------------------------------------------------------------------- Foot Assessment Details Patient Name: Date of Service: [name] 03/28/2020 2:45 PM Medical Record [id number] Patient Account Number: [id number] Date of Birth/Sex: Treating RN: 1946-04-24 (74 y.o. [name] Primary Care Kameryn Tisdel: Nicholes Stairs Other Clinician: Referring Cara Thaxton: Treating Kennadee Walthour/Extender:Robson, Vickey Sages, Dorothe Pea in Treatment: 0 Foot Assessment Items Site Locations + = Sensation present, - = Sensation absent, C = Callus, U = Ulcer R = Redness, W = Warmth, M = Maceration, PU = Pre-ulcerative lesion F = Fissure, S = Swelling, D = Dryness Assessment Right: Left: Other Deformity: No No Prior Foot Ulcer: No No Prior Amputation: No No Charcot Joint: No No Ambulatory Status: Ambulatory  Without Help Gait: Steady Electronic Signature(s) Signed: 03/28/2020 6:10:03 PM By: Baruch Gouty RN, BSN Entered By: Baruch Gouty on 03/28/2020 15:30:59 -------------------------------------------------------------------------------- Nutrition Risk Screening Details Patient Name: Date of Service: HIROTO, SALTZMAN 03/28/2020 2:45 PM Medical Record [id number] Patient Account Number: [id number] Date of Birth/Sex: Treating RN: [birth year] (74 y.o. [name] Primary Care Floriene Jeschke: Nicholes Stairs Other Clinician: Referring Rechelle Niebla: Treating Candiace West/Extender:Robson, Vickey Sages, Dorothe Pea in Treatment: 0 Height (in): 70 Weight (lbs): 214 Body Mass Index (BMI): 30.7 Nutrition Risk Screening Items Score Screening NUTRITION RISK SCREEN: I have an illness or condition that made me change the kind and/or 0 No amount of food I eat I eat fewer than two meals per day 0 No I eat few fruits and vegetables, or milk products 0 No I have three or more drinks of beer, liquor or wine almost every day 0 No I have tooth or mouth problems that make it hard for me to eat 0 No I don't always have enough money to buy the food I need 0 No I eat alone most of the time 0 No I take three or more different prescribed or over-the-counter drugs a day 1 Yes 0 No Without wanting to, I have lost or gained 10 pounds in the last six months I am not always physically able to shop, cook and/or feed myself 0 No Nutrition Protocols Good Risk Protocol 0 No interventions needed Moderate Risk Protocol High Risk Proctocol Risk Level: Good Risk Score: 1 Electronic Signature(s) Signed: 03/28/2020 6:10:03 PM By: Baruch Gouty RN, BSN Entered By: Baruch Gouty on 03/28/2020 15:28:37

## 2020-03-31 ENCOUNTER — Encounter (HOSPITAL_BASED_OUTPATIENT_CLINIC_OR_DEPARTMENT_OTHER): Payer: Medicare Other | Admitting: Internal Medicine

## 2020-03-31 NOTE — Progress Notes (Signed)
[name], CROCHET ([id number]) Visit Report for 03/28/2020 Allergy List Details Patient Name: Date of Service: [name], BURGETT 03/28/2020 2:45 PM Medical Record [id number] Patient Account Number: [id number] Date of Birth/Sex: Treating RN: 01/08/1946 (74 y.o. [name] Primary Care Jaycion Treml: Nicholes Stairs Other Clinician: Referring Sheryl Towell: Treating Faatima Tench/Extender:Robson, Vickey Sages, Dorothe Pea in Treatment: 0 Allergies Active Allergies No Known Allergies Allergy Notes Electronic Signature(s) Signed: 03/28/2020 6:10:03 PM By: Baruch Gouty RN, BSN Entered By: Baruch Gouty on 03/28/2020 15:11:27 -------------------------------------------------------------------------------- Arrival Information Details Patient Name: Date of Service: [name] 03/28/2020 2:45 PM Medical Record [id number] Patient Account Number: [id number] Date of Birth/Sex: Treating RN: [birth year] (75 y.o. [name] Primary Care Tillie Viverette: Nicholes Stairs Other Clinician: Referring Suhaylah Wampole: Treating Orman Matsumura/Extender:Robson, Vickey Sages, Dorothe Pea in Treatment: 0 Visit Information Patient Arrived: Ambulatory Arrival Time: 14:59 Accompanied By: spouse Transfer Assistance: None Patient Identification Verified: Yes Secondary Verification Process Yes Completed: Patient Requires Transmission-Based No Precautions: Patient Has Alerts: Yes Patient Alerts: Patient on Blood Thinner Electronic Signature(s) Signed: 03/28/2020 6:10:03 PM By: Baruch Gouty RN, BSN Entered By: Baruch Gouty on 03/28/2020 15:03:59 -------------------------------------------------------------------------------- Clinic Level of Care Assessment Details Patient Name: Date of Service: [name], [name] 03/28/2020 2:45 PM Medical Record [id number] Patient Account Number: [id number] Date of Birth/Sex: Treating RN: [birth year] (74 y.o. [name] Primary Care Elyanna Wallick:  Nicholes Stairs Other Clinician: Referring Philicia Heyne: Treating Vora Clover/Extender:Robson, Vickey Sages, Dorothe Pea in Treatment: 0 Clinic Level of Care Assessment Items TOOL 1 Quantity Score X - Use when EandM and Procedure is performed on INITIAL visit 1 0 ASSESSMENTS - Nursing Assessment / Reassessment X - General Physical Exam (combine w/ comprehensive assessment (listed just below) 1 20 when performed on new pt. evals) X - Comprehensive Assessment (HX, ROS, Risk Assessments, Wounds Hx, etc.) 1 25 ASSESSMENTS - Wound and Skin Assessment / Reassessment []  - Dermatologic / Skin Assessment (not related to wound area) 0 ASSESSMENTS - Ostomy and/or Continence Assessment and Care []  - Incontinence Assessment and Management 0 []  - Ostomy Care Assessment and Management (repouching, etc.) 0 PROCESS - Coordination of Care X - Simple Patient / Family Education for ongoing care 1 15 []  - Complex (extensive) Patient / Family Education for ongoing care 0 X - Staff obtains Consents, Records, Test Results / Process Orders 1 10 []  - Staff telephones HHA, Nursing Homes / Clarify orders / etc 0 []  - Routine Transfer to another Facility (non-emergent condition) 0 []  - Routine Hospital Admission (non-emergent condition) 0 X - New Admissions / Biomedical engineer / Ordering NPWT, Apligraf, etc. 1 15 []  - Emergency Hospital Admission (emergent condition) 0 PROCESS - Special Needs []  - Pediatric / Minor Patient Management 0 []  - Isolation Patient Management 0 []  - Hearing / Language / Visual special needs 0 []  - Assessment of Community assistance (transportation, D/C planning, etc.) 0 []  - Additional assistance / Altered mentation 0 []  - Support Surface(s) Assessment (bed, cushion, seat, etc.) 0 INTERVENTIONS - Miscellaneous []  - External ear exam 0 []  - Patient Transfer (multiple staff / Civil Service fast streamer / Similar devices) 0 []  - Simple Staple / Suture removal (25 or less) 0 []  - Complex Staple /  Suture removal (26 or more) 0 []  - Hypo/Hyperglycemic Management (do not check if billed separately) 0 X - Ankle / Brachial Index (ABI) - do not check if billed separately 1 15 Has the patient been seen at the hospital within the last three years: Yes Total Score:  100 Level Of Care: New/Established - Level 3 Electronic Signature(s) Signed: 03/28/2020 6:04:52 PM By: Kela Millin Entered By: Kela Millin on 03/28/2020 16:45:00 -------------------------------------------------------------------------------- Lower Extremity Assessment Details Patient Name: Date of Service: [name], [name] 03/28/2020 2:45 PM Medical Record [id number] Patient Account Number: [id number] Date of Birth/Sex: Treating RN: Jan 23, 1946 (74 y.o. Ulyses Amor, Vaughan Basta Primary Care Lecil Tapp: Nicholes Stairs Other Clinician: Referring Harriett Azar: Treating Addyson Traub/Extender:Robson, Vickey Sages, Dorothe Pea in Treatment: 0 Edema Assessment Assessed: [Left: No] [Right: No] Edema: [Left: No] [Right: No] Calf Left: Right: Point of Measurement: cm From Medial Instep 39.6 cm 41 cm Ankle Left: Right: Point of Measurement: cm From Medial Instep 22.6 cm 23.3 cm Vascular Assessment Pulses: Dorsalis Pedis Palpable: [Left:Yes] [Right:Yes] Blood Pressure: Brachial: [Left:128] [Right:128] Dorsalis Pedis: 170 [Left:Dorsalis Pedis: 025] Ankle: Posterior Tibial: 170 [Left:Posterior Tibial: 180 1.33] [Right:1.52] Electronic Signature(s) Signed: 03/28/2020 6:10:03 PM By: Baruch Gouty RN, BSN Entered By: Baruch Gouty on 03/28/2020 15:39:20 -------------------------------------------------------------------------------- Multi Wound Chart Details Patient Name: Date of Service: [name] 03/28/2020 2:45 PM Medical Record [id number] Patient Account Number: [id number] Date of Birth/Sex: Treating RN: 1946/01/06 (74 y.o. 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] [N/A:N/A] Comorbid History: [1:Glaucoma, Chronic Obstructive Pulmonary Disease (COPD), Sleep Apnea, Hypertension, Neuropathy, Received Chemotherapy, Received Radiation] [N/A:N/A] Date Acquired: [1:02/28/2020] [N/A:N/A] Weeks of Treatment: [1:0] [N/A:N/A] Wound Status: [1:Open] [N/A:N/A] Measurements L x W x D 0.4x0.6x0.1 [N/A:N/A] (cm) Area (cm) : [1:0.188] [N/A:N/A] Volume (cm) : [1:0.019] [N/A:N/A] Classification: [1:Full Thickness Without Exposed Support Structures] [N/A:N/A] Exudate Amount: [1:Small] [N/A:N/A] Exudate Type: [1:Serosanguineous] [N/A:N/A] Exudate Color: [1:red, brown] [N/A:N/A] Wound Margin: [1:Flat and Intact] [N/A:N/A] Granulation Amount: [1:Large (67-100%)] [N/A:N/A] Granulation Quality: [1:Pink, Pale] [N/A:N/A] Necrotic Amount: [1:None Present (0%)] [N/A:N/A] Exposed Structures: [1:Fat Layer (Subcutaneous N/A Tissue) Exposed: Yes Fascia: No Tendon: No Muscle: No Joint: No Bone: No] Epithelialization: [1:None] [N/A:N/A] Debridement: [1:Debridement - Selective/Open Wound] [N/A:N/A] Pre-procedure [1:16:40] [N/A:N/A] Verification/Time Out Taken: Pain Control: [1:Other] [N/A:N/A] Tissue Debrided: [1:Callus] [N/A:N/A] Level: [1:Non-Viable Tissue] [N/A:N/A] Debridement Area (sq cm):0.24 [N/A:N/A] Instrument: [1:Curette] [N/A:N/A] Bleeding: [1:Minimum] [N/A:N/A] Hemostasis Achieved: [1:Pressure] [N/A:N/A] Procedural Pain: [1:0] [N/A:N/A] Post Procedural Pain: [1:0]  [N/A:N/A] Debridement Treatment Procedure was tolerated [N/A:N/A] Response: [1:well] Post Debridement [1:0.4x0.6x0.1] [N/A:N/A] Measurements L x W x D (cm) Post Debridement [1:0.019] [N/A:N/A] Volume: (cm) Procedures Performed: Debridement [N/A:N/A] Treatment Notes Electronic Signature(s) Signed: 03/28/2020 6:04:52 PM By: Kela Millin Signed: 03/31/2020 9:07:11 AM By: Linton Ham MD Entered By: Linton Ham on 03/28/2020 17:16:43 -------------------------------------------------------------------------------- Multi-Disciplinary Care Plan Details Patient Name: Date of Service: [name] 03/28/2020 2:45 PM Medical Record [id number] Patient Account Number: [id number] Date of Birth/Sex: Treating RN: 1946-04-01 (74 y.o. [name] Primary Care Elbia Paro: Other Clinician: Nicholes Stairs Referring Geraldyn Shain: Treating Jolyne Laye/Extender:Robson, Vickey Sages, Dorothe Pea in Treatment: 0 Active Inactive Nutrition Nursing Diagnoses: Potential for alteratiion in Nutrition/Potential for imbalanced nutrition Goals: Patient/caregiver verbalizes understanding of need to maintain therapeutic glucose control per primary care physician Date Initiated: 03/28/2020 Target Resolution Date: 04/25/2020 Goal Status: Active Interventions: Provide education on elevated blood sugars and impact on wound healing Notes: Orientation to the Wound Care Program Nursing Diagnoses: Knowledge deficit related to the wound healing center  program Goals: Patient/caregiver will verbalize understanding of the [facility] Date Initiated: 03/28/2020 Target Resolution Date: 04/25/2020 Goal Status: Active Interventions: Provide education on orientation to the wound center Notes: Wound/Skin Impairment Nursing Diagnoses: Impaired tissue integrity Goals: Ulcer/skin breakdown will have a volume reduction of 30% by week 4 Date Initiated: 03/28/2020 Target Resolution Date:  04/25/2020 Goal Status: Active Interventions: Provide education on ulcer and skin care Notes: Electronic Signature(s) Signed: 03/28/2020 6:04:52 PM By: Kela Millin Entered By: Kela Millin on 03/28/2020 16:42:10 -------------------------------------------------------------------------------- Pain Assessment Details Patient Name: Date of Service: [name], [name] 03/28/2020 2:45 PM Medical Record [id number] Patient Account Number: [id number] Date of Birth/Sex: Treating RN: Nov 26, 1946 (74 y.o. [name] Primary Care Delvin Hedeen: Nicholes Stairs Other Clinician: Referring Quintrell Baze: Treating Rocklyn Mayberry/Extender:Robson, Vickey Sages, Dorothe Pea in Treatment: 0 Active Problems Location of Pain Severity and Description of Pain Patient Has Paino No Site Locations Rate the pain. Current Pain Level: 0 Pain Management and Medication Current Pain Management: Electronic Signature(s) Signed: 03/28/2020 6:10:03 PM By: Baruch Gouty RN, BSN Entered By: Baruch Gouty on 03/28/2020 15:42:26 -------------------------------------------------------------------------------- Patient/Caregiver Education Details Patient Name: Date of Service: [name] 4/9/2021andnbsp2:45 PM Medical Record [phone] Patient Account Number: [id number] Date of Birth/Gender: Treating RN: 1946/05/27 (74 y.o. [name] Primary Care Physician: Nicholes Stairs Other Clinician: Referring Physician: Treating Physician/Extender:Robson, Vickey Sages, Dorothe Pea in Treatment: 0 Education Assessment Education Provided To: Patient Education Topics Provided Elevated Blood Sugar/ Impact on Healing: Methods: Explain/Verbal Responses: State content correctly Welcome To The [facility]: Methods: Explain/Verbal Responses: State content correctly Wound/Skin Impairment: Methods: Explain/Verbal Responses: State content correctly Electronic Signature(s) Signed:  03/28/2020 6:04:52 PM By: Kela Millin Entered By: Kela Millin on 03/28/2020 16:42:59 -------------------------------------------------------------------------------- Wound Assessment Details Patient Name: Date of Service: KENITH, [name] 03/28/2020 2:45 PM Medical Record [id number] Patient Account Number: [id number] Date of Birth/Sex: Treating RN: [birth year] (74 y.o. [name] Primary Care Codylee Patil: Nicholes Stairs Other Clinician: Referring Graziella Connery: Treating Keilan Nichol/Extender:Robson, Vickey Sages, Dorothe Pea in Treatment: 0 Wound Status Wound Number: 1 Primary Neuropathic Ulcer-Non Diabetic Etiology: Wound Location: Right Metatarsal head fifth Wound Open Wounding Event: Gradually Appeared Status: Date Acquired: 02/28/2020 Comorbid Glaucoma, Chronic Obstructive Pulmonary Weeks Of Treatment: 0 History: Disease (COPD), Sleep Apnea, Hypertension, Clustered Wound: No Neuropathy, Received Chemotherapy, Received Radiation Wound Measurements Length: (cm) 0.4 % Reduct Width: (cm) 0.6 % Reduct Depth: (cm) 0.1 Epitheli Area: (cm) 0.188 Tunneli Volume: (cm) 0.019 Undermi Wound Description Classification: Full Thickness Without Exposed Support Foul Od Structures Slough/ Wound Flat and Intact Margin: Exudate Small Amount: Exudate Serosanguineous Type: Exudate red, brown Color: Wound Bed Granulation Amount: Large (67-100%) Granulation Quality: Pink, Pale Fascia Necrotic Amount: None Present (0%) Fat Lay Tendon Muscle Joint E Bone Ex or After Cleansing: No Fibrino No Exposed Structure Exposed: No er (Subcutaneous Tissue) Exposed: Yes Exposed: No Exposed: No xposed: No posed: No ion in Area: ion in Volume: alization: None ng: No ning: No Electronic Signature(s) Signed: 03/28/2020 6:10:03 PM By: Baruch Gouty RN, BSN Entered By: Baruch Gouty on 03/28/2020  15:42:13 -------------------------------------------------------------------------------- [place] Details Patient Name: Date of Service: [name] 03/28/2020 2:45 PM Medical Record [id number] Patient Account Number: [id number] Date of Birth/Sex: Treating RN: [birth year] (74 y.o. [name] Primary Care Yarissa Reining: Nicholes Stairs Other Clinician: Referring Dareon Nunziato: Treating Fadia Marlar/Extender:Robson, Vickey Sages, Dorothe Pea in Treatment: 0 Vital Signs Time Taken: 15:04 Temperature (F): 98.5 Height (in): 70 Pulse (bpm): 104 Source: Stated Respiratory Rate (  breaths/min): 18 Weight (lbs): 214 Blood Pressure (mmHg): 128/76 Source: Stated Reference Range: 80 - 120 mg / dl Body Mass Index (BMI): 30.7 Electronic Signature(s) Signed: 03/28/2020 6:10:03 PM By: Baruch Gouty RN, BSN Entered By: Baruch Gouty on 03/28/2020 15:05:06

## 2020-03-31 NOTE — Progress Notes (Signed)
RENATO, SPELLMAN ([id number]) Visit Report for 03/28/2020 Chief Complaint Document Details Patient Name: Date of Service: SHANTI, EICHEL 03/28/2020 2:45 PM Medical Record [id number] Patient Account Number: [id number] Date of Birth/Sex: Treating RN: 1946/11/29 (74 y.o. [name] Primary Care Provider: Nicholes Stairs Other Clinician: Referring Provider: Treating Provider/Extender:Aunna Snooks, Vickey Sages, Dorothe Pea in Treatment: 0 Information Obtained from: Patient Chief Complaint Wound exam; patient is here for review of the wound on the right fifth met metatarsal head Electronic Signature(s) Signed: 03/31/2020 9:07:11 AM By: Linton Ham MD Entered By: Linton Ham on 03/28/2020 17:18:09 -------------------------------------------------------------------------------- Debridement Details Patient Name: Date of Service: [name] 03/28/2020 2:45 PM Medical Record [id number] Patient Account Number: [id number] Date of Birth/Sex: Treating RN: [birth year] (74 y.o. [name] Primary Care Provider: Nicholes Stairs Other Clinician: Referring Provider: Treating Provider/Extender:Jermari Tamargo, Vickey Sages, Dorothe Pea in Treatment: 0 Debridement Performed for Wound #1 Right Metatarsal head fifth Assessment: Performed By: Physician Ricard Dillon., MD Debridement Type: Debridement Level of Consciousness (Pre- Awake and Alert procedure): Pre-procedure Verification/Time Out Taken: Yes - 16:40 Start Time: 16:40 Pain Control: Other : benzocaine, 20% Total Area Debrided (L x W): 0.4 (cm) x 0.6 (cm) = 0.24 (cm) Tissue and other material Viable, Non-Viable, Callus, Skin: Dermis , Fibrin/Exudate debrided: Level: Skin/Dermis Debridement Description: Selective/Open Wound Instrument: Curette Bleeding: Minimum Hemostasis Achieved: Pressure End Time: 16:41 Procedural Pain: 0 Post Procedural Pain: 0 Response to Treatment: Procedure was  tolerated well Level of Consciousness Awake and Alert (Post-procedure): Post Debridement Measurements of Total Wound Length: (cm) 0.4 Width: (cm) 0.6 Depth: (cm) 0.1 Volume: (cm) 0.019 Character of Wound/Ulcer Post Improved Debridement: Post Procedure Diagnosis Same as Pre-procedure Electronic Signature(s) Signed: 03/28/2020 6:04:52 PM By: Kela Millin Signed: 03/31/2020 9:07:11 AM By: Linton Ham MD Entered By: Linton Ham on 03/28/2020 17:17:52 -------------------------------------------------------------------------------- HPI Details Patient Name: Date of Service: [name] 03/28/2020 2:45 PM Medical Record [id number] Patient Account Number: [id number] Date of Birth/Sex: Treating RN: Oct 29, 1946 (74 y.o. [name] Primary Care Provider: Nicholes Stairs Other Clinician: Referring Provider: Treating Provider/Extender:Jearlene Bridwell, Vickey Sages, Dorothe Pea in Treatment: 0 History of Present Illness HPI Description: ADMISSION 03/28/2020 This is a 74 year old retired man who has a history of neuropathy nondiabetic apparently felt to be secondary to chemotherapy he received this part of treatment for adeno CA of the lung many years ago. Apparently this started about a month ago. He was seen in the walk-in clinic and had cellulitis. Noted to have a draining wound on the plantar aspect of his right foot. He was given Rocephin and treated with doxycycline. And follow-up with primary care culture showed MRSA also resistant to quinolones. He was given doxycycline and clindamycin. When he was seen in follow-up on March 22 his doxycycline was refilled. The wound looked better from an infection point of view. He has finished his doxycycline. He has been using Bactroban to the wound not offloading this at all. He walks around in bare feet in the house. 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 noncompressible and 1.33 on the left also noncompressible Electronic Signature(s) Signed: 03/31/2020 9:07:11 AM By: Linton Ham MD Entered By: Linton Ham on 03/28/2020 17:21:50 -------------------------------------------------------------------------------- Physical Exam Details Patient Name: Date  of Service: BRAYDYN, SCHULTES 03/28/2020 2:45 PM Medical Record [id number] Patient Account Number: [id number] Date of Birth/Sex: Treating RN: Oct 11, 1946 (74 y.o. [name] Primary Care Provider: Nicholes Stairs Other Clinician: Referring Provider: Treating Provider/Extender:Heddy Vidana, Vickey Sages, Dorothe Pea in Treatment: 0 Constitutional Sitting or standing Blood Pressure is within target range for patient.. Pulse regular and within target range for patient.Marland Kitchen Respirations regular, non-labored and within target range.. Temperature is normal and within the target range for the patient.Marland Kitchen Appears in no distress. Respiratory work of breathing is normal. Cardiovascular Popliteal pulses also felt to be normal.. It will pulses are robust on the right foot. Integumentary (Hair, Skin) No primary skin condition is seen. Neurological Completely absent sensation to the microfilament on the plantar right foot. Notes Wound exam; the area in question is on the right plantar fifth metatarsal head. Small pale wound with surrounding callus. Using a #5 curette the surrounding callus was removed. Fairly easy to bleed probably because of anticoagulants. There is no current evidence of infection no erythema. No purulent drainage. Electronic Signature(s) Signed: 03/31/2020 9:07:11 AM By: Linton Ham MD Entered By: Linton Ham on 03/28/2020  17:23:33 -------------------------------------------------------------------------------- Physician Orders Details Patient Name: Date of Service: ASER, NYLUND 03/28/2020 2:45 PM Medical Record [id number] Patient Account Number: [id number] Date of Birth/Sex: Treating RN: Apr 12, 1946 (74 y.o. [name] Primary Care Provider: Nicholes Stairs Other Clinician: Referring Provider: Treating Provider/Extender:Monetta Lick, Vickey Sages, Dorothe Pea in Treatment: 0 Verbal / Phone Orders: No Diagnosis Coding Follow-up Appointments Return Appointment in 1 week. Dressing Change Frequency Wound #1 Right Metatarsal head fifth Change dressing every day. Wound Cleansing May shower and wash wound with soap and water. - with dressing change Primary Wound Dressing Wound #1 Right Metatarsal head fifth Collagen - moisten with hydrogel or KY jelly Secondary Dressing Wound #1 Right Metatarsal head fifth Kerlix/Rolled Gauze Dry Gauze Edema Control Avoid standing for long periods of time Elevate legs to the level of the heart or above for 30 minutes daily and/or when sitting, a frequency of: Off-Loading Other: - front offloader to right Electronic Signature(s) Signed: 03/28/2020 6:04:52 PM By: Kela Millin Signed: 03/31/2020 9:07:11 AM By: Linton Ham MD Entered By: Kela Millin on 03/28/2020 16:47:29 -------------------------------------------------------------------------------- Problem List Details Patient Name: Date of Service: [name] 03/28/2020 2:45 PM Medical Record [id number] Patient Account Number: [id number] Date of Birth/Sex: Treating RN: Sep 03, 1946 (74 y.o. [name] Primary Care Provider: Nicholes Stairs Other Clinician: Referring Provider: Treating Provider/Extender:Adya Wirz, Vickey Sages, Dorothe Pea in Treatment: 0 Active Problems ICD-10 Evaluated Encounter Code Description Active Date Today Diagnosis L97.511  Non-pressure chronic ulcer of other part of right foot 03/28/2020 No Yes limited to breakdown of skin G61.1 Serum neuropathy 03/28/2020 No Yes Inactive Problems Resolved Problems Electronic Signature(s) Signed: 03/31/2020 9:07:11 AM By: Linton Ham MD Entered By: Linton Ham on 03/28/2020 17:11:34 -------------------------------------------------------------------------------- Progress Note Details Patient Name: Date of Service: [name] 03/28/2020 2:45 PM Medical Record [id number] Patient Account Number: [id number] Date of Birth/Sex: Treating RN: October 16, 1946 (74 y.o. [name] Primary Care Provider: Nicholes Stairs Other Clinician: Referring Provider: Treating Provider/Extender:Ovie Eastep, Vickey Sages, Dorothe Pea in Treatment: 0 Subjective Chief Complaint Information obtained from Patient Wound exam; patient is here for review of the wound on the right fifth met metatarsal head History of Present Illness (HPI) ADMISSION 03/28/2020 This is a 74 year old retired man who has a history of neuropathy nondiabetic apparently felt to be secondary to chemotherapy he received  this part of treatment for adeno CA of the lung many years ago. Apparently this started about a month ago. He was seen in the walk-in clinic and had cellulitis. Noted to have a draining wound on the plantar aspect of his right foot. He was given Rocephin and treated with doxycycline. And follow-up with primary care culture showed MRSA also resistant to quinolones. He was given doxycycline and clindamycin. When he was seen in follow-up on March 22 his doxycycline was refilled. The wound looked better from an infection point of view. He has finished his doxycycline. He has been using Bactroban to the wound not offloading this at all. He walks around in bare feet in the house. 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 noncompressible and 1.33 on the left also noncompressible Patient History Allergies No Known Allergies Family History Cancer - Mother, Heart Disease - Father,Siblings, Hypertension - Mother,Father,Siblings, No family history of Diabetes, Hereditary Spherocytosis, Kidney Disease, Lung Disease, Seizures, Stroke, Thyroid Problems, Tuberculosis. Social History Former smoker - quit 2005, Marital Status - Married, Alcohol Use - Daily - bottle wine per day, Drug Use - No History, Caffeine Use - Daily - coffee. Medical History Eyes Patient has history of Glaucoma - mild Denies history of Cataracts, Optic Neuritis Respiratory Patient has history of Chronic Obstructive Pulmonary Disease (COPD), Sleep Apnea - no CPAP Cardiovascular Patient has history of Hypertension Neurologic Patient has history of Neuropathy Oncologic Patient has history of Received Chemotherapy, Received Radiation Psychiatric Denies history of Anorexia/bulimia, Confinement Anxiety Hospitalization/Surgery History - left inguinal hernia repair. - lobectomy right and left lung. - mouth cancer surgery. - tonsilectomy. 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 Eyes Complains or has symptoms of Glasses / Contacts. Ear/Nose/Mouth/Throat Denies complaints or symptoms of Chronic sinus problems or rhinitis. Respiratory Complains or has symptoms of Shortness of Breath. Gastrointestinal Complains or has  symptoms of Frequent diarrhea. Endocrine Denies complaints or symptoms of Heat/cold intolerance. Genitourinary Denies complaints or symptoms of Frequent urination. Integumentary (Skin) Complains or has symptoms of Wounds - right foot. Musculoskeletal Denies complaints or symptoms of Muscle Pain, Muscle Weakness. Neurologic Complains or has symptoms of Numbness/parasthesias - both feet and tips of fingers. Psychiatric Complains or has symptoms of Claustrophobia. Denies complaints or symptoms of Suicidal. Objective Constitutional Sitting or standing Blood Pressure is within target range for patient.. Pulse regular and within target range for patient.Marland Kitchen Respirations regular, non-labored and within target range.. Temperature is normal and within the target range for the patient.Marland Kitchen Appears in no distress. Vitals Time Taken: 3:04 PM, Height: 70 in, Source: Stated, Weight: 214 lbs, Source: Stated, BMI: 30.7, Temperature: 98.5 F, Pulse: 104 bpm, Respiratory Rate: 18 breaths/min, Blood Pressure: 128/76 mmHg. Respiratory work of breathing is normal. Cardiovascular Popliteal pulses also felt to be normal.. It will pulses are robust on the right foot. Neurological Completely absent sensation to the microfilament on the plantar right foot. General Notes: Wound exam; the area in question is on the right plantar fifth metatarsal  head. Small pale wound with surrounding callus. Using a #5 curette the surrounding callus was removed. Fairly easy to bleed probably because of anticoagulants. There is no current evidence of infection no erythema. No purulent drainage. Integumentary (Hair, Skin) No primary skin condition is seen. Wound #1 status is Open. Original cause of wound was Gradually Appeared. The wound is located on the Right Metatarsal head fifth. The wound measures 0.4cm length x 0.6cm width x 0.1cm depth; 0.188cm^2 area and 0.019cm^3 volume. There is Fat Layer (Subcutaneous Tissue) Exposed  exposed. There is no tunneling or undermining noted. There is a small amount of serosanguineous drainage noted. The wound margin is flat and intact. There is large (67-100%) pink, pale granulation within the wound bed. There is no necrotic tissue within the wound bed. Assessment Active Problems ICD-10 Non-pressure chronic ulcer of other part of right foot limited to breakdown of skin Serum neuropathy Procedures Wound #1 Pre-procedure diagnosis of Wound #1 is a Neuropathic Ulcer-Non Diabetic located on the Right Metatarsal head fifth . There was a Selective/Open Wound Skin/Dermis Debridement with a total area of 0.24 sq cm performed by Ricard Dillon., MD. With the following instrument(s): Curette to remove Viable and Non-Viable tissue/material. Material removed includes Callus, Skin: Dermis, and Fibrin/Exudate after achieving pain control using Other (benzocaine, 20%). No specimens were taken. A time out was conducted at 16:40, prior to the start of the procedure. A Minimum amount of bleeding was controlled with Pressure. The procedure was tolerated well with a pain level of 0 throughout and a pain level of 0 following the procedure. Post Debridement Measurements: 0.4cm length x 0.6cm width x 0.1cm depth; 0.019cm^3 volume. Character of Wound/Ulcer Post Debridement is improved. Post procedure Diagnosis Wound #1: Same as Pre-Procedure Plan Follow-up Appointments: Return Appointment in 1 week. Dressing Change Frequency: Wound #1 Right Metatarsal head fifth: Change dressing every day. Wound Cleansing: May shower and wash wound with soap and water. - with dressing change Primary Wound Dressing: Wound #1 Right Metatarsal head fifth: Collagen - moisten with hydrogel or KY jelly Secondary Dressing: Wound #1 Right Metatarsal head fifth: Kerlix/Rolled Gauze Dry Gauze Edema Control: Avoid standing for long periods of time Elevate legs to the level of the heart or above for 30 minutes  daily and/or when sitting, a frequency of: Off-Loading: Other: - front offloader to right 1. The area was debrided hopefully to give the healthy base for healing. A fair amount of bleeding controlled with direct pressure. 2. There was not any depth of this wound area #3 neuropathic wound with recent complicating infection. In order to heal this he is going to have to get the pressure off. I gave him a forefoot offloading boot. He did not tolerate this very well. His wife had to bring him downstairs in a wheelchair although they have a walker at home they are hopeful this will allow him to keep the pressure off this. 4. No evidence of infection currently although he has been aggressively treated with antibiotics with some improvement. 5. Although his ABIs were noncompressible in this clinic bilaterally he has robust peripheral pulses I am doubtful that there is any significant PAD to the point this would not heal I spent 35 minutes in review of this patient's records, face-to-face evaluation and preparation of this record Electronic Signature(s) Signed: 03/31/2020 9:07:11 AM By: Linton Ham MD Entered By: Linton Ham on 03/28/2020 17:26:17 -------------------------------------------------------------------------------- HxROS Details Patient Name: Date of Service: [name] 03/28/2020 2:45 PM Medical Record [id number] Patient  Account Number: [id number] Date of Birth/Sex: Treating RN: 08-19-1946 (74 y.o. [name] Primary Care Provider: Nicholes Stairs Other Clinician: Referring Provider: Treating Provider/Extender:Kyran Connaughton, Vickey Sages, Dorothe Pea in Treatment: 0 Constitutional Symptoms (General Health) Complaints and Symptoms: Negative for: Fatigue; Fever; Chills; Marked Weight Change Eyes Complaints and Symptoms: Positive for: Glasses / Contacts Medical History: Positive for: Glaucoma - mild Negative for: Cataracts; Optic  Neuritis Ear/Nose/Mouth/Throat Complaints and Symptoms: Negative for: Chronic sinus problems or rhinitis Respiratory Complaints and Symptoms: Positive for: Shortness of Breath Medical History: Positive for: Chronic Obstructive Pulmonary Disease (COPD); Sleep Apnea - no CPAP Gastrointestinal Complaints and Symptoms: Positive for: Frequent diarrhea Medical History: Past Medical History Notes: ischemic colitis Endocrine Complaints and Symptoms: Negative for: Heat/cold intolerance Genitourinary Complaints and Symptoms: Negative for: Frequent urination Integumentary (Skin) Complaints and Symptoms: Positive for: Wounds - right foot Musculoskeletal Complaints and Symptoms: Negative for: Muscle Pain; Muscle Weakness Medical History: Past Medical History Notes: hx polio as child Neurologic Complaints and Symptoms: Positive for: Numbness/parasthesias - both feet and tips of fingers Medical History: Positive for: Neuropathy Psychiatric Complaints and Symptoms: Positive for: Claustrophobia Negative for: Suicidal Medical History: Negative for: Anorexia/bulimia; Confinement Anxiety Hematologic/Lymphatic Cardiovascular Medical History: Positive for: Hypertension Past Medical History Notes: hypercholesteremia, 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 Immunological Oncologic Medical History: Positive for: Received Chemotherapy; Received Radiation Past Medical History Notes: hx lung CA right and left lung, hx mouth CA HBO Extended History Items Eyes: Glaucoma Immunizations Pneumococcal Vaccine: Received Pneumococcal Vaccination: Yes Implantable Devices Yes Hospitalization / Surgery History Type of Hospitalization/Surgery left inguinal hernia repair lobectomy right and left lung mouth cancer surgery tonsilectomy Family and Social History Cancer: Yes - Mother; Diabetes: No; Heart Disease: Yes - Father,Siblings; Hereditary Spherocytosis: No; Hypertension: Yes -  Mother,Father,Siblings; Kidney Disease: No; Lung Disease: No; Seizures: No; Stroke: No; Thyroid Problems: No; Tuberculosis: No; Former smoker - quit 2005; Marital Status - Married; Alcohol Use: Daily - bottle wine per day; Drug Use: No History; Caffeine Use: Daily - coffee; Financial Concerns: No; Food, Clothing or Shelter Needs: No; Support System Lacking: No; Transportation Concerns: No Electronic Signature(s) Signed: 03/28/2020 6:10:03 PM By: Baruch Gouty RN, BSN Signed: 03/31/2020 9:07:11 AM By: Linton Ham MD Entered By: Baruch Gouty on 03/28/2020 15:26:40 -------------------------------------------------------------------------------- SuperBill Details Patient Name: Date of Service: [name] 03/28/2020 Medical Record [id number] Patient Account Number: [id number] Date of Birth/Sex: Treating RN: 07/01/1946 (74 y.o. [name] Primary Care Provider: Nicholes Stairs Other Clinician: Referring Provider: Treating Provider/Extender:Yassmine Tamm, Vickey Sages, Dorothe Pea in Treatment: 0 Diagnosis Coding ICD-10 Codes Code Description L97.511 Non-pressure chronic ulcer of other part of right foot limited to breakdown of skin G61.1 Serum neuropathy Facility Procedures CPT4 Code Description: [id number] [place] VISIT-LEV 3 EST PT Modifier: 25 Quantity: 1 CPT4 Code Description: [id number] - DEBRIDE WOUND 1ST 20 SQ CM OR < ICD-10 Diagnosis Description L97.511 Non-pressure chronic ulcer of other part of right foot limi Modifier: ted to breakdo Quantity: 1 wn of skin Physician Procedures CPT4 Code Description: [id number] WC PHYS LEVEL 3 NEW PT ICD-10 Diagnosis Description L97.511 Non-pressure chronic ulcer of other part of right foot limi G61.1 Serum neuropathy Modifier: 25 ted to breakdo Quantity: 1 wn of skin Electronic Signature(s) Signed: 03/31/2020 9:07:11 AM By: Linton Ham MD Entered By: Linton Ham on 03/28/2020 17:26:42

## 2020-04-04 ENCOUNTER — Encounter (HOSPITAL_BASED_OUTPATIENT_CLINIC_OR_DEPARTMENT_OTHER): Payer: Medicare Other | Admitting: Internal Medicine

## 2020-04-04 ENCOUNTER — Other Ambulatory Visit: Payer: Self-pay

## 2020-04-04 DIAGNOSIS — L97511 Non-pressure chronic ulcer of other part of right foot limited to breakdown of skin: Secondary | ICD-10-CM | POA: Diagnosis not present

## 2020-04-05 NOTE — Progress Notes (Signed)
[name], [name] ([id number]) Visit Report for 04/04/2020 HPI Details Patient Name: Date of Service: [name], [name] 04/04/2020 1:45 PM Medical Record [id number] Patient Account Number: [id number] Date of Birth/Sex: Treating RN: Apr 24, 1946 (74 y.o. [name] Primary Care Provider: Nicholes Everett Other Clinician: Referring Provider: Treating Provider/Extender:Kebron Pulse, Vickey Sages, Dorothe Pea in Treatment: 1 History of Present Illness HPI Description: ADMISSION 03/28/2020 This is a 74 year old retired man who has a history of neuropathy nondiabetic apparently felt to be secondary to chemotherapy he received this part of treatment for adeno CA of the lung many years ago. Apparently this started about a month ago. He was seen in the walk-in clinic and had cellulitis. Noted to have a draining wound on the plantar aspect of his right foot. He was given Rocephin and treated with doxycycline. And follow-up with primary care culture showed MRSA also resistant to quinolones. He was given doxycycline and clindamycin. When he was seen in follow-up on March 22 his doxycycline was refilled. The wound looked better from an infection point of view. He has finished his doxycycline. He has been using Bactroban to the wound not offloading this at all. He walks around in bare feet in the house. 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 noncompressible and 1.33 on the left also noncompressible 4/16; patient with a nondiabetic idiopathic peripheral neuropathy. Neuropathic wound presented to the clinic last week on the plantar right fifth met head. This became secondarily infected treated by his primary physician and walk-in  clinics. He has not shown any evidence of infection. We gave him a forefoot offloading boot. Fortunately he was able to manage to work with this. He arrives in the clinic today with this wound almost healed. We were using silver collagen Electronic Signature(s) Signed: 04/05/2020 6:55:41 AM By: Linton Ham MD Entered By: Linton Ham on 04/04/2020 14:38:13 -------------------------------------------------------------------------------- Physical Exam Details Patient Name: Date of Service: [name], [name] 04/04/2020 1:45 PM Medical Record [id number] Patient Account Number: [id number] Date of Birth/Sex: Treating RN: [birth year] (74 y.o. [name] Primary Care Provider: Other Clinician: Nicholes Everett Referring Provider: Treating Provider/Extender:Rishik Tubby, Vickey Sages, Dorothe Pea in Treatment: 1 Constitutional Sitting or standing Blood Pressure is within target range for patient.. Pulse regular and within target range for patient.Marland Kitchen Respirations regular, non-labored and within target range.. Temperature is normal and within the target range for the patient.Marland Kitchen Appears in no distress. Respiratory work of breathing is normal. Cardiovascular Pedal pulses are palpable. Integumentary (Hair, Skin) Callus around the wound no erythema. Notes Wound exam; the area in question is on the right plantar fifth metatarsal head. Fair amount of surrounding callus. Only has a pinpoint area that is not totally closed this week. There is no evidence of surrounding infection. No debridement was required Electronic Signature(s) Signed: 04/05/2020 6:55:41 AM By: Linton Ham MD Entered By: Linton Ham on 04/04/2020 14:39:32 -------------------------------------------------------------------------------- Physician Orders Details Patient Name: Date of Service: [name], [name] 04/04/2020 1:45 PM Medical Record [id number] Patient Account Number: [id number] Date of  Birth/Sex: Treating RN: 1946/11/29 (74 y.o. [name] Primary Care Provider: Nicholes Everett Other Clinician: Referring Provider: Treating Provider/Extender:Jourdyn Hasler, Vickey Sages, Dorothe Pea in Treatment: 1 Verbal / Phone Orders: No Diagnosis Coding ICD-10 Coding Code  Description L97.511 Non-pressure chronic ulcer of other part of right foot limited to breakdown of skin G61.1 Serum neuropathy Follow-up Appointments Return Appointment in 1 week. Dressing Change Frequency Wound #1 Right Metatarsal head fifth Change dressing every day. Wound Cleansing May shower and wash wound with soap and water. - with dressing change Primary Wound Dressing Wound #1 Right Metatarsal head fifth Calcium Alginate with Silver Secondary Dressing Wound #1 Right Metatarsal head fifth Kerlix/Rolled Gauze Dry Gauze Edema Control Avoid standing for long periods of time Elevate legs to the level of the heart or above for 30 minutes daily and/or when sitting, a frequency of: Off-Loading Other: - front offloader to right Electronic Signature(s) Signed: 04/04/2020 4:21:34 PM By: Kela Millin Signed: 04/05/2020 6:55:41 AM By: Linton Ham MD Entered By: Kela Millin on 04/04/2020 13:59:50 -------------------------------------------------------------------------------- Problem List Details Patient Name: Date of Service: [name] 04/04/2020 1:45 PM Medical Record [id number] Patient Account Number: [id number] Date of Birth/Sex: Treating RN: [birth year] (74 y.o. [name] Primary Care Provider: Nicholes Everett Other Clinician: Referring Provider: Treating Provider/Extender:Jamira Barfuss, Vickey Sages, Dorothe Pea in Treatment: 1 Active Problems ICD-10 Evaluated Encounter Code Description Active Date Today Diagnosis L97.511 Non-pressure chronic ulcer of other part of right foot 03/28/2020 No Yes limited to breakdown of skin G61.1 Serum neuropathy 03/28/2020  No Yes Inactive Problems Resolved Problems Electronic Signature(s) Signed: 04/05/2020 6:55:41 AM By: Linton Ham MD Entered By: Linton Ham on 04/04/2020 14:36:07 -------------------------------------------------------------------------------- Progress Note Details Patient Name: Date of Service: [name] 04/04/2020 1:45 PM Medical Record [id number] Patient Account Number: [id number] Date of Birth/Sex: Treating RN: Dec 28, 1945 (74 y.o. [name] Primary Care Provider: Nicholes Everett Other Clinician: Referring Provider: Treating Provider/Extender:Tewana Bohlen, Vickey Sages, Dorothe Pea in Treatment: 1 Subjective History of Present Illness (HPI) ADMISSION 03/28/2020 This is a 74 year old retired man who has a history of neuropathy nondiabetic apparently felt to be secondary to chemotherapy he received this part of treatment for adeno CA of the lung many years ago. Apparently this started about a month ago. He was seen in the walk-in clinic and had cellulitis. Noted to have a draining wound on the plantar aspect of his right foot. He was given Rocephin and treated with doxycycline. And follow-up with primary care culture showed MRSA also resistant to quinolones. He was given doxycycline and clindamycin. When he was seen in follow-up on March 22 his doxycycline was refilled. The wound looked better from an infection point of view. He has finished his doxycycline. He has been using Bactroban to the wound not offloading this at all. He walks around in bare feet in the house. 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 noncompressible and 1.33 on the left also noncompressible 4/16; patient with a nondiabetic  idiopathic peripheral neuropathy. Neuropathic wound presented to the clinic last week on the plantar right fifth met head. This became secondarily infected treated by his primary physician and walk-in clinics. He has not shown any evidence of infection. We gave him a forefoot offloading boot. Fortunately he was able to manage to work with this. He arrives in the clinic today with this wound almost healed. We were using silver collagen Objective Constitutional Sitting or standing Blood Pressure is within target range for patient.Marland Kitchen  Pulse regular and within target range for patient.Marland Kitchen Respirations regular, non-labored and within target range.. Temperature is normal and within the target range for the patient.Marland Kitchen Appears in no distress. Vitals Time Taken: 1:48 PM, Height: 70 in, Weight: 214 lbs, BMI: 30.7, Temperature: 97.8 F, Pulse: 85 bpm, Respiratory Rate: 18 breaths/min, Blood Pressure: 141/71 mmHg. Respiratory work of breathing is normal. Cardiovascular Pedal pulses are palpable. General Notes: Wound exam; the area in question is on the right plantar fifth metatarsal head. Fair amount of surrounding callus. Only has a pinpoint area that is not totally closed this week. There is no evidence of surrounding infection. No debridement was required Integumentary (Hair, Skin) Callus around the wound no erythema. Wound #1 status is Open. Original cause of wound was Gradually Appeared. The wound is located on the Right Metatarsal head fifth. The wound measures 0.3cm length x 0.4cm width x 0.1cm depth; 0.094cm^2 area and 0.009cm^3 volume. There is Fat Layer (Subcutaneous Tissue) Exposed exposed. There is no tunneling or undermining noted. There is a small amount of serosanguineous drainage noted. The wound margin is flat and intact. There is large (67-100%) pink, pale granulation within the wound bed. There is no necrotic tissue within the wound bed. Assessment Active Problems ICD-10 Non-pressure  chronic ulcer of other part of right foot limited to breakdown of skin Serum neuropathy Plan Follow-up Appointments: Return Appointment in 1 week. Dressing Change Frequency: Wound #1 Right Metatarsal head fifth: Change dressing every day. Wound Cleansing: May shower and wash wound with soap and water. - with dressing change Primary Wound Dressing: Wound #1 Right Metatarsal head fifth: Calcium Alginate with Silver Secondary Dressing: Wound #1 Right Metatarsal head fifth: Kerlix/Rolled Gauze Dry Gauze Edema Control: Avoid standing for long periods of time Elevate legs to the level of the heart or above for 30 minutes daily and/or when sitting, a frequency of: Off-Loading: Other: - front offloader to right 1. . I changed the primary dressing to silver alginate 2. This should be healed next week. 3. We spent some time talking about offloading this area going forward. They asked about a referral to podiatry we recommended [facility]. They might be able to suggest insoles and offloading. There is probably some subluxation here of the fifth met head contributing to pressure on this area however this is the first time he has had a wound and I am not sure an aggressive approach here is currently indicated other than finding the best way to offload this area when he is up on his feet 4. I emphasized that he can never be barefoot Electronic Signature(s) Signed: 04/05/2020 6:55:41 AM By: Linton Ham MD Entered By: Linton Ham on 04/04/2020 14:41:43 -------------------------------------------------------------------------------- SuperBill Details Patient Name: Date of Service: [name] 04/04/2020 Medical Record [id number] Patient Account Number: [id number] Date of Birth/Sex: Treating RN: 02/26/1946 (74 y.o. [name] Primary Care Provider: Nicholes Everett Other Clinician: Referring Provider: Treating Provider/Extender:Lezlie Ritchey, Vickey Sages,  Dorothe Pea in Treatment: 1 Diagnosis Coding ICD-10 Codes Code Description L97.511 Non-pressure chronic ulcer of other part of right foot limited to breakdown of skin G61.1 Serum neuropathy Facility Procedures CPT4 Code: [id number] Description: 99213 - WOUND CARE VISIT-LEV 3 EST PT Modifier: Quantity: 1 Physician Procedures CPT4 Code Description: [id number] 94801 - WC PHYS LEVEL 3 - EST PT ICD-10 Diagnosis Description L97.511 Non-pressure chronic ulcer of other part of right foot limit G61.1 Serum neuropathy Modifier: ed to breakdown Quantity: 1 of skin Electronic Signature(s) Signed: 04/05/2020 6:55:41 AM  By: Linton Ham MD Entered By: Linton Ham on 04/04/2020 14:42:02

## 2020-04-08 NOTE — Progress Notes (Signed)
AVYAN, LIVESAY ([id number]) Visit Report for 04/04/2020 Arrival Information Details Patient Name: Date of Service: JAGGAR, BENKO 04/04/2020 1:45 PM Medical Record [id number] Patient Account Number: [id number] Date of Birth/Sex: Treating RN: [birth year] (74 y.o. [name] Primary Care Candace Begue: Nicholes Stairs Other Clinician: Referring Emaly Boschert: Treating Shekita Boyden/Extender:Robson, Vickey Sages, Dorothe Pea in Treatment: 1 Visit Information History Since Last Visit Added or deleted any medications: No Patient Arrived: Ambulatory Any new allergies or adverse reactions: No Arrival Time: 13:48 Had a fall or experienced change in No Accompanied By: wife activities of daily living that may affect Transfer Assistance: None risk of falls: Patient Identification Verified: Yes Signs or symptoms of abuse/neglect since No Secondary Verification Process Yes last visito Completed: Hospitalized since last visit: No Patient Requires Transmission- No Implantable device outside of the clinic No Based Precautions: excluding Patient Has Alerts: Yes cellular tissue based products placed in the Patient Alerts: Patient on Blood center Thinner since last visit: Has Dressing in Place as Prescribed: Yes Has Footwear/Offloading in Place as Yes Prescribed: Right: Wedge Shoe Pain Present Now: No Electronic Signature(s) Signed: 04/04/2020 4:06:47 PM By: Deon Pilling Entered By: Deon Pilling on 04/04/2020 13:51:23 -------------------------------------------------------------------------------- Clinic Level of Care Assessment Details Patient Name: Date of Service: MASSIE, MEES 04/04/2020 1:45 PM Medical Record [id number] Patient Account Number: [id number] Date of Birth/Sex: Treating RN: [birth year] (74 y.o. [name] Primary Care Tighe Gitto: Nicholes Stairs Other Clinician: Referring Deshonna Trnka: Treating Deneshia Zucker/Extender:Robson, Vickey Sages, Dorothe Pea in Treatment: 1 Clinic Level of Care Assessment Items TOOL 4 Quantity Score X - Use when only an EandM is performed on FOLLOW-UP visit 1 0 ASSESSMENTS - Nursing Assessment / Reassessment X - Reassessment of Co-morbidities (includes updates in patient status) 1 10 X - Reassessment of Adherence to Treatment Plan 1 5 ASSESSMENTS - Wound and Skin Assessment / Reassessment X - Simple Wound Assessment / Reassessment - one wound 1 5 []  - Complex Wound Assessment / Reassessment - multiple wounds 0 []  - Dermatologic / Skin Assessment (not related to wound area) 0 ASSESSMENTS - Focused Assessment X - Circumferential Edema Measurements - multi extremities 1 5 []  - Nutritional Assessment / Counseling / Intervention 0 []  - Lower Extremity Assessment (monofilament, tuning fork, pulses) 0 []  - Peripheral Arterial Disease Assessment (using hand held doppler) 0 ASSESSMENTS - Ostomy and/or Continence Assessment and Care []  - Incontinence Assessment and Management 0 []  - Ostomy Care Assessment and Management (repouching, etc.) 0 PROCESS - Coordination of Care X - Simple Patient / Family Education for ongoing care 1 15 []  - Complex (extensive) Patient / Family Education for ongoing care 0 X - Staff obtains Programmer, systems, Records, Test Results / Process Orders 1 10 []  - Staff telephones HHA, Nursing Homes / Clarify orders / etc 0 []  - Routine Transfer to another Facility (non-emergent condition) 0 []  - Routine Hospital Admission (non-emergent condition) 0 []  - New Admissions / Biomedical engineer / Ordering NPWT, Apligraf, etc. 0 []  - Emergency Hospital Admission (emergent condition) 0 X - Simple Discharge Coordination 1 10 []  - Complex (extensive) Discharge Coordination 0 PROCESS - Special Needs []  - Pediatric / Minor Patient Management 0 []  - Isolation Patient Management 0 []  - Hearing / Language / Visual special needs 0 []  - Assessment of Community assistance (transportation, D/C planning,  etc.) 0 []  - Additional assistance / Altered mentation 0 []  - Support Surface(s) Assessment (bed, cushion, seat, etc.) 0 INTERVENTIONS - Wound Cleansing / Measurement X - Simple  Wound Cleansing - one wound 1 5 []  - Complex Wound Cleansing - multiple wounds 0 X - Wound Imaging (photographs - any number of wounds) 1 5 []  - Wound Tracing (instead of photographs) 0 X - Simple Wound Measurement - one wound 1 5 []  - Complex Wound Measurement - multiple wounds 0 INTERVENTIONS - Wound Dressings X - Small Wound Dressing one or multiple wounds 1 10 []  - Medium Wound Dressing one or multiple wounds 0 []  - Large Wound Dressing one or multiple wounds 0 X - Application of Medications - topical 1 5 []  - Application of Medications - injection 0 INTERVENTIONS - Miscellaneous []  - External ear exam 0 []  - Specimen Collection (cultures, biopsies, blood, body fluids, etc.) 0 []  - Specimen(s) / Culture(s) sent or taken to Lab for analysis 0 []  - Patient Transfer (multiple staff / Civil Service fast streamer / Similar devices) 0 []  - Simple Staple / Suture removal (25 or less) 0 []  - Complex Staple / Suture removal (26 or more) 0 []  - Hypo / Hyperglycemic Management (close monitor of Blood Glucose) 0 []  - Ankle / Brachial Index (ABI) - do not check if billed separately 0 X - Vital Signs 1 5 Has the patient been seen at the hospital within the last three years: Yes Total Score: 95 Level Of Care: New/Established - Level 3 Electronic Signature(s) Signed: 04/04/2020 4:21:34 PM By: Kela Millin Entered By: Kela Millin on 04/04/2020 14:00:32 -------------------------------------------------------------------------------- Lower Extremity Assessment Details Patient Name: Date of Service: ORANGE, HILLIGOSS 04/04/2020 1:45 PM Medical Record [id number] Patient Account Number: [id number] Date of Birth/Sex: Treating RN: [birth year] (74 y.o. [name] Primary Care Amarii Amy: Nicholes Stairs Other  Clinician: Referring Stephine Langbehn: Treating Klarissa Mcilvain/Extender:Robson, Vickey Sages, Dorothe Pea in Treatment: 1 Edema Assessment Assessed: [Left: No] [Right: Yes] Edema: [Left: No] [Right: No] Calf Left: Right: Point of Measurement: cm From Medial Instep 39.6 cm 40.5 cm Ankle Left: Right: Point of Measurement: cm From Medial Instep 22.6 cm 22 cm Electronic Signature(s) Signed: 04/04/2020 4:06:47 PM By: Deon Pilling Signed: 04/04/2020 4:21:34 PM By: Kela Millin Entered By: Deon Pilling on 04/04/2020 13:53:31 -------------------------------------------------------------------------------- Multi Wound Chart Details Patient Name: Date of Service: [name] 04/04/2020 1:45 PM Medical Record [id number] Patient Account Number: [id number] Date of Birth/Sex: Treating RN: 05-Nov-1946 (75 y.o. 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] [N/A:N/A] Comorbid History: [1:Glaucoma, Chronic Obstructive Pulmonary Disease (COPD), Sleep Apnea, Hypertension, Neuropathy, Received Chemotherapy, Received Radiation] [N/A:N/A] Date Acquired: [1:02/28/2020] [N/A:N/A] Weeks of Treatment: [1:1] [N/A:N/A] Wound Status: [1:Open] [N/A:N/A] Measurements L x W x D 0.3x0.4x0.1 [N/A:N/A] (cm) Area (cm) : [1:0.094] [N/A:N/A] Volume (cm) : [1:0.009] [N/A:N/A] % Reduction in Area: [1:50.00%] [N/A:N/A] % Reduction in Volume: 52.60% [N/A:N/A] Classification: [1:Full Thickness Without Exposed Support  Structures] [N/A:N/A] Exudate Amount: [1:Small] [N/A:N/A] Exudate Type: [1:Serosanguineous] [N/A:N/A] Exudate Color: [1:red, brown] [N/A:N/A] Wound Margin: [1:Flat and Intact] [N/A:N/A] Granulation Amount: [1:Large (67-100%)] [N/A:N/A] Granulation Quality: [1:Pink, Pale] [N/A:N/A] Necrotic Amount: [1:None Present (0%)] [N/A:N/A] Exposed Structures: [1:Fat Layer (Subcutaneous N/A Tissue) Exposed: Yes Fascia: No Tendon: No Muscle: No Joint: No Bone: No Large (67-100%)] [N/A:N/A] Treatment Notes Electronic Signature(s) Signed: 04/04/2020 4:21:34 PM By: Kela Millin  Signed: 04/05/2020 6:55:41 AM By: Linton Ham MD Entered By: Linton Ham on 04/04/2020 14:36:14 -------------------------------------------------------------------------------- Multi-Disciplinary Care Plan Details Patient Name: Date of Service: CARSTEN, CARSTARPHEN 04/04/2020 1:45 PM Medical Record [id number] Patient Account Number: [id number] Date of Birth/Sex: Treating RN: [birth year] (74 y.o. [name] Primary Care Priyal Musquiz: Nicholes Stairs Other Clinician: Referring Alanzo Lamb: Treating Jamilette Suchocki/Extender:Robson, Vickey Sages, Dorothe Pea in Treatment: 1 Active Inactive Nutrition Nursing Diagnoses: Potential for alteratiion in Nutrition/Potential for imbalanced nutrition Goals: Patient/caregiver verbalizes understanding of need to maintain therapeutic glucose control per primary care physician Date Initiated: 03/28/2020 Target Resolution Date: 04/25/2020 Goal Status: Active Interventions: Provide education on elevated blood sugars and impact on wound healing Notes: Orientation to the Wound Care Program Nursing Diagnoses: Knowledge deficit related to the wound healing center program Goals: Patient/caregiver will verbalize understanding of the Learned Program Date Initiated: 03/28/2020 Target Resolution Date: 04/25/2020 Goal Status: Active Interventions: Provide education on  orientation to the wound center Notes: Wound/Skin Impairment Nursing Diagnoses: Impaired tissue integrity Goals: Ulcer/skin breakdown will have a volume reduction of 30% by week 4 Date Initiated: 03/28/2020 Target Resolution Date: 04/25/2020 Goal Status: Active Interventions: Provide education on ulcer and skin care Notes: Electronic Signature(s) Signed: 04/04/2020 4:21:34 PM By: Kela Millin Entered By: Kela Millin on 04/04/2020 13:59:07 -------------------------------------------------------------------------------- Pain Assessment Details Patient Name: Date of Service: MACHI, WHITTAKER 04/04/2020 1:45 PM Medical Record [id number] Patient Account Number: [id number] Date of Birth/Sex: Treating RN: 1946/03/26 (74 y.o. [name] Primary Care Tamsen Reist: Nicholes Stairs Other Clinician: Referring Alexea Blase: Treating Kendrea Cerritos/Extender:Robson, Vickey Sages, Dorothe Pea in Treatment: 1 Active Problems Location of Pain Severity and Description of Pain Patient Has Paino No Site Locations Rate the pain. Current Pain Level: 0 Pain Management and Medication Current Pain Management: Medication: No Cold Application: No Rest: No Massage: No Activity: No T.E.N.S.: No Heat Application: No Leg drop or elevation: No Is the Current Pain Management Adequate: Adequate How does your wound impact your activities of daily livingo Sleep: No Bathing: No Appetite: No Relationship With Others: No Bladder Continence: No Emotions: No Bowel Continence: No Work: No Toileting: No Drive: No Dressing: No Hobbies: No Electronic Signature(s) Signed: 04/04/2020 4:06:47 PM By: Deon Pilling Signed: 04/04/2020 4:21:34 PM By: Kela Millin Entered By: Deon Pilling on 04/04/2020 13:52:24 -------------------------------------------------------------------------------- Patient/Caregiver Education Details Patient Name: Date of Service: [name]  4/16/2021andnbsp1:45 PM Medical Record [id number] Patient Account Number: [id number] Date of Birth/Gender: Treating RN: Dec 10, 1946 (74 y.o. [name] Primary Care Physician: Nicholes Stairs Other Clinician: Referring Physician: Treating Physician/Extender:Robson, Vickey Sages, Dorothe Pea in Treatment: 1 Education Assessment Education Provided To: Patient Education Topics Provided Elevated Blood Sugar/ Impact on Healing: Methods: Explain/Verbal Responses: State content correctly Welcome To [facility] [place]: Methods: Explain/Verbal Responses: State content correctly Wound/Skin Impairment: Methods: Explain/Verbal Responses: State content correctly Electronic Signature(s) Signed: 04/04/2020 4:21:34 PM By: Kela Millin Entered By: Kela Millin on 04/04/2020 13:59:27 -------------------------------------------------------------------------------- Wound Assessment Details Patient Name: Date of Service: TYON, CERASOLI 04/04/2020 1:45 PM Medical Record [id number] Patient Account Number: [id number] Date of Birth/Sex: Treating RN: 29-Feb-1946 (75 y.o. [name] Primary Care Kaiyden Simkin: Nicholes Stairs Other Clinician: Referring Reeanna Acri: Treating Gavino Fouch/Extender:Robson, Vickey Sages, Dorothe Pea in Treatment: 1 Wound Status Wound Number: 1 Primary Neuropathic Ulcer-Non Diabetic Etiology: Wound Location: Right Metatarsal head fifth Wound Open Wounding Event: Gradually Appeared Status: Date Acquired: 02/28/2020 ComorbidGlaucoma, Chronic Obstructive Pulmonary Weeks Of Treatment: 1 Weeks Of Treatment: 1 History: Disease (COPD),  Sleep Apnea, Hypertension, Clustered Wound: No Neuropathy, Received Chemotherapy, Received Radiation Photos Wound Measurements Length: (cm) 0.3 % Reduct Width: (cm) 0.4 % Reduct Depth: (cm) 0.1 Epitheli Area: (cm) 0.094 Tunneli Volume: (cm) 0.009 Undermi Wound Description Classification: Full  Thickness Without Exposed Support Foul Od Structures Slough/ Wound Flat and Intact Margin: Exudate Small Amount: Exudate Serosanguineous Type: Exudate red, brown Color: Wound Bed Granulation Amount: Large (67-100%) Granulation Quality: Pink, Pale Fascia Necrotic Amount: None Present (0%) Fat Lay Tendon Muscle Joint E Bone Ex or After Cleansing: No Fibrino No Exposed Structure Exposed: No er (Subcutaneous Tissue) Exposed: Yes Exposed: No Exposed: No xposed: No posed: No ion in Area: 50% ion in Volume: 52.6% alization: Large (67-100%) ng: No ning: No Electronic Signature(s) Signed: 04/04/2020 4:21:34 PM By: Kela Millin Signed: 04/08/2020 1:29:28 PM By: Sandre Kitty Entered By: Sandre Kitty on 04/04/2020 15:35:33 -------------------------------------------------------------------------------- [place] Details Patient Name: Date of Service: [name] 04/04/2020 1:45 PM Medical Record [id number] Patient Account Number: [id number] Date of Birth/Sex: Treating RN: June 24, 1946 (74 y.o. [name] Primary Care Vannie Hochstetler: Other Clinician: Nicholes Stairs Referring Sherriann Szuch: Treating Ahniyah Giancola/Extender:Robson, Vickey Sages, Dorothe Pea in Treatment: 1 Vital Signs Time Taken: 13:48 Temperature (F): 97.8 Height (in): 70 Pulse (bpm): 85 Weight (lbs): 214 Respiratory Rate (breaths/min): 18 Body Mass Index (BMI): 30.7 Blood Pressure (mmHg): 141/71 Reference Range: 80 - 120 mg / dl Electronic Signature(s) Signed: 04/04/2020 4:06:47 PM By: Deon Pilling Entered By: Deon Pilling on 04/04/2020 13:51:44

## 2020-04-11 ENCOUNTER — Other Ambulatory Visit: Payer: Self-pay

## 2020-04-11 ENCOUNTER — Encounter (HOSPITAL_BASED_OUTPATIENT_CLINIC_OR_DEPARTMENT_OTHER): Payer: Medicare Other | Admitting: Internal Medicine

## 2020-04-11 DIAGNOSIS — L97511 Non-pressure chronic ulcer of other part of right foot limited to breakdown of skin: Secondary | ICD-10-CM | POA: Diagnosis not present

## 2020-04-11 NOTE — Progress Notes (Signed)
Caleb, Everett ([id number]) Visit Report for 04/11/2020 Debridement Details Patient Name: Date of Service: [name], PALMIERI 04/11/2020 12:45 PM Medical Record [id number] Patient Account Number: [id number] Date of Birth/Sex: Treating RN: July 07, 1946 (74 y.o. [name] Primary Care Provider: Nicholes Stairs Other Clinician: Referring Provider: Treating Provider/Extender:Datrell Dunton, Vickey Sages, Dorothe Pea in Treatment: 2 Debridement Performed for Wound #1 Right Metatarsal head fifth Assessment: Performed By: Physician Ricard Dillon., MD Debridement Type: Debridement Level of Consciousness (Pre- Awake and Alert procedure): Pre-procedure Yes - 13:14 Verification/Time Out Taken: Start Time: 13:14 Pain Control: Other : benzocaine, 20% Total Area Debrided (L x W): 0.2 (cm) x 0.4 (cm) = 0.08 (cm) Tissue and other material Viable, Non-Viable, Callus, Skin: Dermis debrided: Level: Skin/Dermis Debridement Description: Selective/Open Wound Instrument: Curette Bleeding: None End Time: 13:15 Procedural Pain: 0 Post Procedural Pain: 0 Response to Treatment: Procedure was tolerated well Level of Consciousness Awake and Alert (Post-procedure): Post Debridement Measurements of Total Wound Length: (cm) 0.2 Width: (cm) 0.4 Depth: (cm) 0.1 Volume: (cm) 0.006 Character of Wound/Ulcer Post Improved Debridement: Post Procedure Diagnosis Same as Pre-procedure Electronic Signature(s) Signed: 04/11/2020 5:13:47 PM By: Kela Millin Signed: 04/11/2020 5:22:40 PM By: Linton Ham MD Entered By: Linton Ham on 04/11/2020 13:29:01 -------------------------------------------------------------------------------- HPI Details Patient Name: Date of Service: [name] 04/11/2020 12:45 PM Medical Record [id number] Patient Account Number: [id number] Date of Birth/Sex: Treating RN: 09/28/1946 (74 y.o. [name] Primary Care Provider: Nicholes Stairs Other Clinician: Referring Provider: Treating Provider/Extender:Rotunda Worden, Vickey Sages, Dorothe Pea in Treatment: 2 History of Present Illness HPI Description: ADMISSION 03/28/2020 This is a 74 year old retired man who has a history of neuropathy nondiabetic apparently felt to be secondary to chemotherapy he received this part of treatment for adeno CA of the lung many years ago. Apparently this started about a month ago. He was seen in the walk-in clinic and had cellulitis. Noted to have a draining wound on the plantar aspect of his right foot. He was given Rocephin and treated with doxycycline. And follow-up with primary care culture showed MRSA also resistant to quinolones. He was given doxycycline and clindamycin. When he was seen in follow-up on March 22 his doxycycline was refilled. The wound looked better from an infection point of view. He has finished his doxycycline. He has been using Bactroban to the wound not offloading this at all. He walks around in bare feet in the house. 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 noncompressible and 1.33 on the left also noncompressible 4/16; patient with a nondiabetic idiopathic peripheral neuropathy. Neuropathic wound presented to the clinic last week on the plantar right fifth met head. This became secondarily infected treated by his primary physician and walk-in clinics. He has not shown any evidence of infection. We gave him a forefoot offloading boot. Fortunately he was able to manage to work with this. He arrives in the clinic today with this wound almost healed. We were using silver collagen 4/23; wound on the plantar aspect of the right fifth metatarsal head. I thought  this might be close this week however it is not. We have been using silver alginate Electronic Signature(s) Signed: 04/11/2020 5:22:40 PM By: Linton Ham MD Entered By: Linton Ham on 04/11/2020 13:29:27 --------------------------------------------------------------------------------  Physical Exam Details Patient Name: Date of Service: [name], [name] 04/11/2020 12:45 PM Medical Record [id number] Patient Account Number: [id number] Date of Birth/Sex: Treating RN: [birth year] (74 y.o. [name] Primary Care Provider: Nicholes Stairs Other Clinician: Referring Provider: Treating Provider/Extender:Sylis Ketchum, Vickey Sages, Dorothe Pea in Treatment: 2 Constitutional Sitting or standing Blood Pressure is within target range for patient.. Pulse regular and within target range for patient.Marland Kitchen Respirations regular, non-labored and within target range.. Temperature is normal and within the target range for the patient.Marland Kitchen Appears in no distress. Notes Wound exam; area questions on the right plantar fifth metatarsal head. Surrounding thick skin and callus removed some surface debris. The wound cleans up quite nicely. We used a #3 curette. Hemostasis with direct pressure. Electronic Signature(s) Signed: 04/11/2020 5:22:40 PM By: Linton Ham MD Entered By: Linton Ham on 04/11/2020 13:30:31 -------------------------------------------------------------------------------- Physician Orders Details Patient Name: Date of Service: Caleb, Everett 04/11/2020 12:45 PM Medical Record [id number] Patient Account Number: [id number] Date of Birth/Sex: Treating RN: [birth year] (74 y.o. [name] Primary Care Provider: Nicholes Stairs Other Clinician: Referring Provider: Treating Provider/Extender:Moishe Schellenberg, Vickey Sages, Dorothe Pea in Treatment: 2 Verbal / Phone Orders: No Diagnosis Coding ICD-10 Coding Code Description L97.511 Non-pressure chronic ulcer of  other part of right foot limited to breakdown of skin G61.1 Serum neuropathy Follow-up Appointments Return Appointment in 1 week. Dressing Change Frequency Wound #1 Right Metatarsal head fifth Change dressing every day. Wound Cleansing May shower and wash wound with soap and water. - with dressing change Primary Wound Dressing Wound #1 Right Metatarsal head fifth Calcium Alginate with Silver Secondary Dressing Wound #1 Right Metatarsal head fifth Kerlix/Rolled Gauze Dry Gauze Edema Control Avoid standing for long periods of time Elevate legs to the level of the heart or above for 30 minutes daily and/or when sitting, a frequency of: Off-Loading Other: - front offloader to right Electronic Signature(s) Signed: 04/11/2020 5:13:47 PM By: Kela Millin Signed: 04/11/2020 5:22:40 PM By: Linton Ham MD Entered By: Kela Millin on 04/11/2020 13:16:24 -------------------------------------------------------------------------------- Problem List Details Patient Name: Date of Service: [name] 04/11/2020 12:45 PM Medical Record [id number] Patient Account Number: [id number] Date of Birth/Sex: Treating RN: 11-May-1946 (74 y.o. [name] Primary Care Provider: Nicholes Stairs Other Clinician: Referring Provider: Treating Provider/Extender:Mayer Vondrak, Vickey Sages, Dorothe Pea in Treatment: 2 Active Problems ICD-10 Evaluated Encounter Code Description Active Date Today Diagnosis L97.511 Non-pressure chronic ulcer of other part of right foot 03/28/2020 No Yes limited to breakdown of skin G61.1 Serum neuropathy 03/28/2020 No Yes Inactive Problems Resolved Problems Electronic Signature(s) Signed: 04/11/2020 5:22:40 PM By: Linton Ham MD Entered By: Linton Ham on 04/11/2020 13:28:44 -------------------------------------------------------------------------------- Progress Note Details Patient Name: Date of Service: [name] 04/11/2020  12:45 PM Medical Record [id number] Patient Account Number: [id number] Date of Birth/Sex: Treating RN: [birth year] (74 y.o. [name] Primary Care Provider: Nicholes Stairs Other Clinician: Referring Provider: Treating Provider/Extender:Hattye Siegfried, Vickey Sages, Dorothe Pea in Treatment: 2 Subjective History of Present Illness (HPI) ADMISSION 03/28/2020 This is a 74 year old retired man who has a history of neuropathy nondiabetic apparently felt to be secondary to chemotherapy he received this part of treatment for adeno CA of the lung many years ago. Apparently this started about a month ago. He was seen in the walk-in clinic and had cellulitis. Noted to have a draining wound on the plantar aspect of his right foot. He was given Rocephin and treated with doxycycline. And follow-up with primary care  culture showed MRSA also resistant to quinolones. He was given doxycycline and clindamycin. When he was seen in follow-up on March 22 his doxycycline was refilled. The wound looked better from an infection point of view. He has finished his doxycycline. He has been using Bactroban to the wound not offloading this at all. He walks around in bare feet in the house. 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 noncompressible and 1.33 on the left also noncompressible 4/16; patient with a nondiabetic idiopathic peripheral neuropathy. Neuropathic wound presented to the clinic last week on the plantar right fifth met head. This became secondarily infected treated by his primary physician and walk-in clinics. He has not shown any evidence of infection. We gave him a forefoot offloading boot. Fortunately he was able to manage to work  with this. He arrives in the clinic today with this wound almost healed. We were using silver collagen 4/23; wound on the plantar aspect of the right fifth metatarsal head. I thought this might be close this week however it is not. We have been using silver alginate Objective Constitutional Sitting or standing Blood Pressure is within target range for patient.. Pulse regular and within target range for patient.Marland Kitchen Respirations regular, non-labored and within target range.. Temperature is normal and within the target range for the patient.Marland Kitchen Appears in no distress. Vitals Time Taken: 12:45 PM, Height: 70 in, Weight: 214 lbs, BMI: 30.7, Temperature: 98.4 F, Pulse: 82 bpm, Respiratory Rate: 20 breaths/min, Blood Pressure: 143/65 mmHg. General Notes: Wound exam; area questions on the right plantar fifth metatarsal head. Surrounding thick skin and callus removed some surface debris. The wound cleans up quite nicely. We used a #3 curette. Hemostasis with direct pressure. Integumentary (Hair, Skin) Wound #1 status is Open. Original cause of wound was Gradually Appeared. The wound is located on the Right Metatarsal head fifth. The wound measures 0.2cm length x 0.4cm width x 0.1cm depth; 0.063cm^2 area and 0.006cm^3 volume. There is Fat Layer (Subcutaneous Tissue) Exposed exposed. There is no tunneling or undermining noted. There is a none present amount of drainage noted. The wound margin is flat and intact. There is no granulation within the wound bed. There is no necrotic tissue within the wound bed. General Notes: callused over. Assessment Active Problems ICD-10 Non-pressure chronic ulcer of other part of right foot limited to breakdown of skin Serum neuropathy Procedures Wound #1 Pre-procedure diagnosis of Wound #1 is a Neuropathic Ulcer-Non Diabetic located on the Right Metatarsal head fifth . There was a Selective/Open Wound Skin/Dermis Debridement with a total area of 0.08 sq cm performed  by Ricard Dillon., MD. With the following instrument(s): Curette to remove Viable and Non-Viable tissue/material. Material removed includes Callus and Skin: Dermis and after achieving pain control using Other (benzocaine, 20%). No specimens were taken. A time out was conducted at 13:14, prior to the start of the procedure. There was no bleeding. The procedure was tolerated well with a pain level of 0 throughout and a pain level of 0 following the procedure. Post Debridement Measurements: 0.2cm length x 0.4cm width x 0.1cm depth; 0.006cm^3 volume. Character of Wound/Ulcer Post Debridement is improved. Post procedure Diagnosis Wound #1: Same as Pre-Procedure Plan Follow-up Appointments: Return Appointment in 1 week. Dressing Change Frequency:  Wound #1 Right Metatarsal head fifth: Change dressing every day. Wound Cleansing: May shower and wash wound with soap and water. - with dressing change Primary Wound Dressing: Wound #1 Right Metatarsal head fifth: Calcium Alginate with Silver Secondary Dressing: Wound #1 Right Metatarsal head fifth: Kerlix/Rolled Gauze Dry Gauze Edema Control: Avoid standing for long periods of time Elevate legs to the level of the heart or above for 30 minutes daily and/or when sitting, a frequency of: Off-Loading: Other: - front offloader to right 1. Continue with the silver alginate kerlix and rolled gauze 2. We have him in a forefoot offloading shoe 3. He has an idiopathic peripheral neuropathy I will need to discuss with him what were going to do with this going forward in terms of ongoing offloading Electronic Signature(s) Signed: 04/11/2020 5:22:40 PM By: Linton Ham MD Entered By: Linton Ham on 04/11/2020 13:31:22 -------------------------------------------------------------------------------- SuperBill Details Patient Name: Date of Service: [name] 04/11/2020 Medical Record [id number] Patient Account Number:  [id number] Date of Birth/Sex: Treating RN: [birth year] (74 y.o. [name] Primary Care Provider: Nicholes Stairs Other Clinician: Referring Provider: Treating Provider/Extender:Jeronimo Hellberg, Vickey Sages, Dorothe Pea in Treatment: 2 Diagnosis Coding ICD-10 Codes Code Description L97.511 Non-pressure chronic ulcer of other part of right foot limited to breakdown of skin G61.1 Serum neuropathy Facility Procedures CPT4 Code Description: [id number] - DEBRIDE WOUND 1ST 20 SQ CM OR < ICD-10 Diagnosis Description L97.511 Non-pressure chronic ulcer of other part of right foot limit Modifier: ed to breakdow Quantity: 1 n of skin Physician Procedures CPT4 Code Description: [id number] - WC PHYS DEBR WO ANESTH 20 SQ CM ICD-10 Diagnosis Description L97.511 Non-pressure chronic ulcer of other part of right foot limit Modifier: ed to breakdow Quantity: 1 n of skin Electronic Signature(s) Signed: 04/11/2020 5:22:40 PM By: Linton Ham MD Entered By: Linton Ham on 04/11/2020 13:31:34

## 2020-04-11 NOTE — Progress Notes (Signed)
[name], [name] ([id number]) Visit Report for 04/11/2020 Arrival Information Details Patient Name: Date of Service: [name], [name] 04/11/2020 12:45 PM Medical Record [id number] Patient Account Number: [id number] Date of Birth/Sex: Treating RN: 05/01/1946 (74 y.o. [name] Primary Care Daysen Gundrum: Nicholes Stairs Other Clinician: Referring Ishanvi Mcquitty: Treating Corneshia Hines/Extender:Robson, Vickey Sages, Dorothe Pea in Treatment: 2 Visit Information History Since Last Visit Added or deleted any medications: No Patient Arrived: Ambulatory Any new allergies or adverse reactions: No Arrival Time: 12:42 Had a fall or experienced change in No Accompanied By: wife activities of daily living that may affect Transfer Assistance: None risk of falls: Patient Identification Verified: Yes Signs or symptoms of abuse/neglect since No Secondary Verification Process Yes last visito Completed: Hospitalized since last visit: No Patient Requires Transmission- No Implantable device outside of the clinic No Based Precautions: excluding Patient Has Alerts: Yes cellular tissue based products placed in the Patient Alerts: Patient on Blood center Thinner since last visit: Has Dressing in Place as Prescribed: Yes Has Footwear/Offloading in Place as Yes Prescribed: Right: Wedge Shoe Pain Present Now: No Electronic Signature(s) Signed: 04/11/2020 5:04:12 PM By: Deon Pilling Entered By: Deon Pilling on 04/11/2020 12:48:54 -------------------------------------------------------------------------------- Encounter Discharge Information Details Patient Name: Date of Service: [name] 04/11/2020 12:45 PM Medical Record [id number] Patient Account Number: [id number] Date of Birth/Sex: Treating RN: 12-11-1946 (74 y.o. [name] Primary Care Lamount Bankson: Nicholes Stairs Other Clinician: Referring Loic Hobin: Treating Librada Castronovo/Extender:Robson, Vickey Sages, Dorothe Pea in Treatment: 2 Encounter Discharge Information Items Post Procedure Vitals Discharge Condition: Stable Temperature (F): 98.4 Ambulatory Status: Ambulatory Pulse (bpm): 82 Discharge Destination: Home Respiratory Rate (breaths/min): 20 Transportation: Private Auto Blood Pressure (mmHg): 143/65 Accompanied By: wife Schedule Follow-up Appointment: Yes Clinical Summary of Care: Electronic Signature(s) Signed: 04/11/2020 5:04:12 PM By: Deon Pilling Entered By: Deon Pilling on 04/11/2020 13:26:20 -------------------------------------------------------------------------------- Lower Extremity Assessment Details Patient Name: Date of Service: [name], [name] 04/11/2020 12:45 PM Medical Record [id number] Patient Account Number: [id number] Date of Birth/Sex: Treating RN: 1946/07/29 (74 y.o. [name] Primary Care Chrys Landgrebe: Nicholes Stairs Other Clinician: Referring Holt Woolbright: Treating Treylin Burtch/Extender:Robson, Vickey Sages, Dorothe Pea in Treatment: 2 Edema Assessment Assessed: [Left: No] [Right: Yes] Edema: [Left: No] [Right: No] Calf Left: Right: Point of Measurement: cm From Medial Instep cm 39 cm Ankle Left: Right: Point of Measurement: cm From Medial Instep cm 23 cm Vascular Assessment Pulses: Dorsalis Pedis Palpable: [Right:Yes] Electronic Signature(s) Signed: 04/11/2020 5:04:12 PM By: Deon Pilling Signed: 04/11/2020 5:13:47 PM By: Kela Millin Entered By: Deon Pilling on 04/11/2020 12:48:28 -------------------------------------------------------------------------------- Multi Wound Chart Details Patient Name: Date of Service: [name] 04/11/2020 12:45 PM Medical Record [id number] Patient Account Number: [id number] Date of Birth/Sex: Treating RN: 29-May-1946 (74 y.o. 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] [N/A:N/A] Comorbid History: [1:Glaucoma, Chronic Obstructive Pulmonary Disease (COPD), Sleep Apnea, Hypertension, Neuropathy, Received Chemotherapy, Received Radiation] [N/A:N/A] Date Acquired: [1:02/28/2020] [N/A:N/A] Weeks of Treatment: [1:2] [N/A:N/A] Wound Status: [1:Open] [N/A:N/A] Measurements L x W x D 0.2x0.4x0.1 [N/A:N/A] (cm) Area (cm) : [1:0.063] [N/A:N/A] Volume (cm) : [1:0.006] [N/A:N/A] % Reduction in Area: [1:66.50%] [N/A:N/A] % Reduction in Volume: 68.40% [N/A:N/A] Classification: [  1:Full Thickness Without Exposed Support Structures] [N/A:N/A] Exudate Amount: [1:None Present] [N/A:N/A] Wound Margin: [1:Flat and Intact] [N/A:N/A] Granulation Amount: [1:None Present (0%)] [N/A:N/A] Necrotic Amount: [1:None Present (0%)] [N/A:N/A] Exposed Structures: [1:Fat Layer (Subcutaneous N/A Tissue) Exposed: Yes Fascia: No Tendon: No Muscle: No Joint: No Bone: No] Epithelialization: [1:Large (67-100%)] [N/A:N/A] Debridement: [1:Debridement - Selective/Open Wound] [N/A:N/A] Pre-procedure [1:13:14] [N/A:N/A] Verification/Time Out Taken: Pain Control: [1:Other] [N/A:N/A] Tissue Debrided: [1:Callus] [N/A:N/A] Level: [1:Skin/Dermis] [N/A:N/A] Debridement Area (sq cm):0.08 [N/A:N/A] Instrument: [1:Curette] [N/A:N/A] Bleeding: [1:None] [N/A:N/A] Procedural Pain: [1:0] [N/A:N/A] Post Procedural Pain: [1:0] [N/A:N/A] Debridement Treatment Procedure was tolerated [N/A:N/A] Response: [1:well] Post Debridement [1:0.2x0.4x0.1] [N/A:N/A] Measurements L x W x D (cm) Post Debridement [1:0.006]  [N/A:N/A] Volume: (cm) Assessment Notes: [1:callused over.] [N/A:N/A N/A] Treatment Notes Wound #1 (Right Metatarsal head fifth) 1. Cleanse With Wound Cleanser 3. Primary Dressing Applied Calcium Alginate Ag 4. Secondary Dressing Dry Gauze Roll Gauze 5. Secured With Medipore tape 7. Footwear/Offloading device applied Wedge shoe Notes netting. Electronic Signature(s) Signed: 04/11/2020 5:13:47 PM By: Kela Millin Signed: 04/11/2020 5:22:40 PM By: Linton Ham MD Entered By: Linton Ham on 04/11/2020 13:28:50 -------------------------------------------------------------------------------- Multi-Disciplinary Care Plan Details Patient Name: Date of Service: [name], [name] 04/11/2020 12:45 PM Medical Record [id number] Patient Account Number: [id number] Date of Birth/Sex: Treating RN: 09-13-1946 (74 y.o. [name] Primary Care Terre Hanneman: Nicholes Stairs Other Clinician: Referring Mosella Kasa: Treating Johnnay Pleitez/Extender:Robson, Vickey Sages, Dorothe Pea in Treatment: 2 Active Inactive Nutrition Nursing Diagnoses: Potential for alteratiion in Nutrition/Potential for imbalanced nutrition Goals: Patient/caregiver verbalizes understanding of need to maintain therapeutic glucose control per primary care physician Date Initiated: 03/28/2020 Target Resolution Date: 04/25/2020 Goal Status: Active Interventions: Provide education on elevated blood sugars and impact on wound healing Notes: Orientation to the Wound Care Program Nursing Diagnoses: Knowledge deficit related to the wound healing center program Goals: Patient/caregiver will verbalize understanding of the [facility] Program Date Initiated: 03/28/2020 Target Resolution Date: 04/25/2020 Goal Status: Active Interventions: Provide education on orientation to the wound center Notes: Wound/Skin Impairment Nursing Diagnoses: Impaired tissue integrity Goals: Ulcer/skin breakdown will have  a volume reduction of 30% by week 4 Date Initiated: 03/28/2020 Target Resolution Date: 04/25/2020 Goal Status: Active Interventions: Provide education on ulcer and skin care Notes: Electronic Signature(s) Signed: 04/11/2020 5:13:47 PM By: Kela Millin Entered By: Kela Millin on 04/11/2020 13:18:50 -------------------------------------------------------------------------------- Pain Assessment Details Patient Name: Date of Service: [name], [name] 04/11/2020 12:45 PM Medical Record [id number] Patient Account Number: [id number] Date of Birth/Sex: Treating RN: 02-16-1946 (74 y.o. [name] Primary Care Syniah Berne: Nicholes Stairs Other Clinician: Referring Vick Filter: Treating Roseann Kees/Extender:Robson, Vickey Sages, Dorothe Pea in Treatment: 2 Active Problems Location of Pain Severity and Description of Pain Patient Has Paino No Site Locations Rate the pain. Current Pain Level: 0 Pain Management and Medication Current Pain Management: Medication: No Cold Application: No Rest: No Massage: No Activity: No T.E.N.S.: No Heat Application: No Leg drop or elevation: No Is the Current Pain Management Adequate: Adequate How does your wound impact your activities of daily livingo Sleep: No Bathing: No Appetite: No Relationship With Others: No Bladder Continence: No Emotions: No Bowel Continence: No Work: No Toileting: No Drive: No Dressing: No Hobbies: No Electronic Signature(s) Signed: 04/11/2020 5:04:12 PM By: Deon Pilling Signed: 04/11/2020 5:13:47 PM By: Kela Millin Entered By: Deon Pilling on 04/11/2020 12:51:50 -------------------------------------------------------------------------------- Patient/Caregiver Education Details Patient Name: Date of Service: [name] 4/23/2021andnbsp12:45 PM Medical Record [phone] Patient Account Number: [id number] Date of Birth/Gender: Feb 07, 1946 (74 y.o.  M) Treating RN: Kela Millin Primary Care Physician: Nicholes Stairs Other Clinician: Referring Physician: Treating Physician/Extender:Robson, Vickey Sages, Dorothe Pea in Treatment: 2 Education Assessment Education Provided To: Patient Education Topics Provided Elevated Blood Sugar/ Impact on Healing: Handouts: Elevated Blood Sugars: How Do They Affect Wound Healing Methods: Explain/Verbal Responses: State content correctly Welcome To The [place]: Handouts: Welcome To [facility] [place] Methods: Explain/Verbal Responses: State content correctly Wound/Skin Impairment: Handouts: Caring for Your Ulcer Methods: Demonstration Responses: State content correctly Electronic Signature(s) Signed: 04/11/2020 5:13:47 PM By: Kela Millin Entered By: Kela Millin on 04/11/2020 13:19:18 -------------------------------------------------------------------------------- Wound Assessment Details Patient Name: Date of Service: [name], [name] 04/11/2020 12:45 PM Medical Record [id number] Patient Account Number: [id number] Date of Birth/Sex: Treating RN: Jul 09, 1946 (74 y.o. [name] Primary Care Witt Plitt: Nicholes Stairs Other Clinician: Referring Kayslee Furey: Treating Jheri Mitter/Extender:Robson, Vickey Sages, Dorothe Pea in Treatment: 2 Wound Status Wound Number: 1 Primary Neuropathic Ulcer-Non Diabetic Etiology: Wound Location: Right Metatarsal head fifth Wound Open Wounding Event: Gradually Appeared Status: Date Acquired: 02/28/2020 Comorbid Glaucoma, Chronic Obstructive Pulmonary Weeks Of Treatment: 2 History: Disease (COPD), Sleep Apnea, Hypertension, Clustered Wound: No Neuropathy, Received Chemotherapy, Received Radiation Wound Measurements Length: (cm) 0.2 % Reduction Width: (cm) 0.4 % Reduc Depth: (cm) 0.1 Epithel Area: (cm) 0.063 Tunnel Volume: (cm) 0.006 Underm Wound Description Classification: Full Thickness Without Exposed Support Foul  Od Structures Slough/ Wound Flat and Intact Margin: Exudate None Present Amount: Wound Bed Granulation Amount: None Present (0%) Necrotic Amount: None Present (0%) Fascia Fat Lay Tendon Muscle Joint E Bone Ex or After Cleansing: No Fibrino No Exposed Structure Exposed: No er (Subcutaneous Tissue) Exposed: Yes Exposed: No Exposed: No xposed: No posed: No in Area: 66.5% tion in Volume: 68.4% ialization: Large (67-100%) ing: No ining: No Assessment Notes callused over. Treatment Notes Wound #1 (Right Metatarsal head fifth) 1. Cleanse With Wound Cleanser 3. Primary Dressing Applied Calcium Alginate Ag 4. Secondary Dressing Dry Gauze Roll Gauze 5. Secured With Medipore tape 7. Footwear/Offloading device applied Wedge shoe Notes netting. Electronic Signature(s) Signed: 04/11/2020 5:04:12 PM By: Deon Pilling Signed: 04/11/2020 5:13:47 PM By: Kela Millin Entered By: Deon Pilling on 04/11/2020 12:50:43 -------------------------------------------------------------------------------- Vitals Details Patient Name: Date of Service: [name], [name] 04/11/2020 12:45 PM Medical Record [id number] Patient Account Number: [id number] Date of Birth/Sex: Treating RN: 07/16/1946 (74 y.o. [name] Primary Care Olimpia Tinch: Nicholes Stairs Other Clinician: Referring Maliik Karner: Treating Timmey Lamba/Extender:Robson, Vickey Sages, Dorothe Pea in Treatment: 2 Vital Signs Time Taken: 12:45 Temperature (F): 98.4 Height (in): 70 Pulse (bpm): 82 Weight (lbs): 214 Respiratory Rate (breaths/min): 20 Body Mass Index (BMI): 30.7 Blood Pressure (mmHg): 143/65 Reference Range: 80 - 120 mg / dl Electronic Signature(s) Signed: 04/11/2020 5:04:12 PM By: Deon Pilling Entered By: Deon Pilling on 04/11/2020 12:51:37

## 2020-04-18 ENCOUNTER — Encounter (HOSPITAL_BASED_OUTPATIENT_CLINIC_OR_DEPARTMENT_OTHER): Payer: Medicare Other | Admitting: Internal Medicine

## 2020-04-18 DIAGNOSIS — L97511 Non-pressure chronic ulcer of other part of right foot limited to breakdown of skin: Secondary | ICD-10-CM | POA: Diagnosis not present

## 2020-04-18 NOTE — Progress Notes (Signed)
[name], [name] ([id number]) Visit Report for 04/18/2020 HPI Details Patient Name: Date of Service: DELRICO, MINEHART RD C. 04/18/2020 1:30 PM Medical Record Number: [id number] Patient Account Number: [id number] Date of Birth/Sex: Treating RN: [birth year] (74 y.o. [name] Primary Care Provider: Nicholes Everett Other Clinician: Referring Provider: Treating Provider/Extender: [name] in Treatment: 3 History of Present Illness HPI Description: ADMISSION 03/28/2020 This is a 74 year old retired man who has a history of neuropathy nondiabetic apparently felt to be secondary to chemotherapy he received this part of treatment for adeno CA of the lung many years ago. Apparently this started about a month ago. He was seen in the walk-in clinic and had cellulitis. Noted to have a draining wound on the plantar aspect of his right foot. He was given Rocephin and treated with doxycycline. And follow-up with primary care culture showed MRSA also resistant to quinolones. He was given doxycycline and clindamycin. When he was seen in follow-up on March 22 his doxycycline was refilled. The wound looked better from an infection point of view. He has finished his doxycycline. He has been using Bactroban to the wound not offloading this at all. He walks around in bare feet in the house. 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 noncompressible and 1.33 on the left also noncompressible 4/16; patient with a nondiabetic idiopathic peripheral neuropathy. Neuropathic wound presented to the clinic last week on the plantar right fifth met head. This became secondarily infected treated by his primary physician and walk-in clinics.  He has not shown any evidence of infection. We gave him a forefoot offloading boot. Fortunately he was able to manage to work with this. He arrives in the clinic today with this wound almost healed. We were using silver collagen 4/23; wound on the plantar aspect of the right fifth metatarsal head. I thought this might be close this week however it is not. We have been using silver alginate 4/30; the wound on the plantar aspect of the fifth metatarsal head in a man with a idiopathic nondiabetic peripheral neuropathy. The area is closed this week. He still has a lot of callus around this wound indicative of pressure/friction when he walks. Electronic Signature(s) Signed: 04/18/2020 6:02:27 PM By: Caleb Ham MD Entered By: [name] on 04/18/2020 16:52:02 -------------------------------------------------------------------------------- Physical Exam Details Patient Name: Date of Service: [name] RD C. 04/18/2020 1:30 PM Medical Record Number: [id number] Patient Account Number: [id number] Date of Birth/Sex: Treating RN: 1946-01-19 (74 y.o. [name] Primary Care Provider: Nicholes Everett Other Clinician: Referring Provider: Treating Provider/Extender: [name] in Treatment: 3 Constitutional Patient is hypertensive.. Pulse regular and within target range for patient.Marland Kitchen Respirations regular, non-labored and within target range.. Temperature is normal and within the target range for the patient.Marland Kitchen Appears in no distress. Notes Wound exam; area questions on the right plantar fifth metatarsal head is closed he has surrounding thick skin and callus indicative of ongoing pressure in this area. Electronic Signature(s) Signed: 04/18/2020 6:02:27 PM By: Caleb Ham MD Entered By: [name] on 04/18/2020 16:52:58 -------------------------------------------------------------------------------- Physician Orders Details Patient Name: Date of  Service: [name] RD C. 04/18/2020 1:30 PM Medical Record Number: [id number] Patient Account Number: [id number] Date of Birth/Sex:  Treating RN: 08-20-1946 (74 y.o. [name] Primary Care Provider: Nicholes Everett Other Clinician: Referring Provider: Treating Provider/Extender: [name] in Treatment: 3 Verbal / Phone Orders: No Diagnosis Coding ICD-10 Coding Code Description L97.511 Non-pressure chronic ulcer of other part of right foot limited to breakdown of skin G61.1 Serum neuropathy Discharge From [facility] Discharge from [place] - Call if wound re-opens Off-Loading Other: - cushion site, with gauze or foam Check about getting shoes with insoles Electronic Signature(s) Signed: 04/18/2020 5:51:03 PM By: [name] Signed: 04/18/2020 6:02:27 PM By: Caleb Ham MD Entered By: [name] on 04/18/2020 14:54:23 -------------------------------------------------------------------------------- Problem List Details Patient Name: Date of Service: [name] RD C. 04/18/2020 1:30 PM Medical Record Number: [id number] Patient Account Number: [id number] Date of Birth/Sex: Treating RN: August 16, 1946 (74 y.o. 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 04/18/2020 1:30 PM Medical Record Number: [id number] Patient Account Number: [id number] Date of Birth/Sex: Treating RN: October 29, 1946 (74 y.o. [name] Primary Care Provider: Nicholes Everett Other Clinician: Referring Provider: Treating Provider/Extender: [name] in Treatment: 3 Subjective History of Present Illness (HPI) ADMISSION 03/28/2020 This is a 74 year old retired man who has a history of neuropathy nondiabetic apparently felt to be secondary to chemotherapy he received this part of treatment for adeno CA of the lung many years ago. Apparently this started about a month ago. He was seen in the walk-in clinic and had cellulitis. Noted to have a draining wound on the plantar aspect of his right foot. He was given Rocephin and treated with doxycycline. And follow-up with primary care culture showed MRSA also resistant to quinolones. He was given doxycycline and clindamycin. When he was seen in follow-up on March 22 his doxycycline was refilled. The wound looked better from an infection point of view. He has finished his doxycycline. He has been using Bactroban to the wound not offloading this at all. He walks around in bare feet in the house. 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 noncompressible and 1.33 on the left also noncompressible 4/16; patient with a nondiabetic idiopathic peripheral neuropathy. Neuropathic wound presented to the clinic last week on the plantar right fifth met head. This became secondarily infected treated by his primary physician and walk-in clinics. He has not shown any evidence of infection. We gave him a forefoot offloading boot. Fortunately he was able to  manage to work with this. He arrives in the clinic today with this wound almost healed. We were using silver collagen 4/23; wound on the plantar aspect of the right fifth metatarsal head. I thought this might be close this week however it is not. We have been using silver alginate 4/30; the wound on the plantar  aspect of the fifth metatarsal head in a man with a idiopathic nondiabetic peripheral neuropathy. The area is closed this week. He still has a lot of callus around this wound indicative of pressure/friction when he walks. Objective Constitutional Patient is hypertensive.. Pulse regular and within target range for patient.Marland Kitchen Respirations regular, non-labored and within target range.. Temperature is normal and within the target range for the patient.Marland Kitchen Appears in no distress. Vitals Time Taken: 1:49 PM, Height: 70 in, Weight: 214 lbs, BMI: 30.7, Temperature: 98.3 F, Pulse: 79 bpm, Respiratory Rate: 18 breaths/min, Blood Pressure: 152/73 mmHg. General Notes: Wound exam; area questions on the right plantar fifth metatarsal head is closed he has surrounding thick skin and callus indicative of ongoing pressure in this area. Integumentary (Hair, Skin) Wound #1 status is Healed - Epithelialized. Original cause of wound was Gradually Appeared. The wound is located on the Right Metatarsal head fifth. The wound measures 0cm length x 0cm width x 0cm depth; 0cm^2 area and 0cm^3 volume. There is no tunneling or undermining noted. There is a none present amount of drainage noted. The wound margin is flat and intact. There is no granulation within the wound bed. There is no necrotic tissue within the wound bed. Assessment Active Problems ICD-10 Non-pressure chronic ulcer of other part of right foot limited to breakdown of skin Serum neuropathy Plan Discharge From [facility]: Discharge from [place] - Call if wound re-opens Off-Loading: Other: - cushion site, with gauze or foam Check  about getting shoes with insoles 1. The patient can be discharged from the clinic. 2. We suggested cushioning this with foam. Insoles in his shoes. 3. His wife asked about a referral to podiatry I suggested [facility] with regards to ongoing maintenance of this area. Question of whether there is indeed a custom insole that could be suggested Electronic Signature(s) Signed: 04/18/2020 6:02:27 PM By: Caleb Ham MD Entered By: [name] on 04/18/2020 16:53:53 -------------------------------------------------------------------------------- SuperBill Details Patient Name: Date of Service: [name] RD C. 04/18/2020 Medical Record Number: [id number] Patient Account Number: [id number] Date of Birth/Sex: Treating RN: October 15, 1946 (74 y.o. [name] Primary Care Provider: Nicholes Everett Other Clinician: Referring Provider: Treating Provider/Extender: [name] in Treatment: 3 Diagnosis Coding ICD-10 Codes Code Description [phone] Non-pressure chronic ulcer of other part of right foot limited to breakdown of skin G61.1 Serum neuropathy Facility Procedures CPT4 Code: [id number] Description: 99213 - WOUND CARE VISIT-LEV 3 EST PT Modifier: Quantity: 1 Physician Procedures : CPT4 Code Description Modifier [id number] - WC PHYS LEVEL 2 - EST PT ICD-10 Diagnosis Description L97.511 Non-pressure chronic ulcer of other part of right foot limited to breakdown of skin G61.1 Serum neuropathy Quantity: 1 Electronic Signature(s) Signed: 04/18/2020 6:02:27 PM By: Caleb Ham MD Entered By: [name] on 04/18/2020 16:54:12

## 2020-04-21 NOTE — Progress Notes (Signed)
DEEP, BONAWITZ ([id number]) Visit Report for 04/18/2020 Arrival Information Details Patient Name: Date of Service: [name], [name] [place]. 04/18/2020 1:30 PM Medical Record Number: [id number] Patient Account Number: [id number] Date of Birth/Sex: Treating RN: [birth year] (74 y.o. [name]) Carlene Coria Primary Care Thena Devora: Nicholes Stairs Other Clinician: Referring Caroline Longie: Treating Evangaline Jou/Extender: Nyra Jabs in Treatment: 3 Visit Information History Since Last Visit All ordered tests and consults were completed: No Patient Arrived: Ambulatory Added or deleted any medications: No Arrival Time: 13:48 Any new allergies or adverse reactions: No Accompanied By: wife Had a fall or experienced change in No Transfer Assistance: None activities of daily living that may affect Patient Identification Verified: Yes risk of falls: Secondary Verification Process Completed: Yes Signs or symptoms of abuse/neglect since last visito No Patient Requires Transmission-Based Precautions: No Hospitalized since last visit: No Patient Has Alerts: Yes Implantable device outside of the clinic excluding No Patient Alerts: Patient on Blood Thinner cellular tissue based products placed in the center since last visit: Has Dressing in Place as Prescribed: Yes Pain Present Now: No Electronic Signature(s) Signed: 04/21/2020 5:02:18 PM By: Carlene Coria RN Entered By: Carlene Coria on 04/18/2020 13:49:34 -------------------------------------------------------------------------------- Clinic Level of Care Assessment Details Patient Name: Date of Service: [name], Caleb RD C. 04/18/2020 1:30 PM Medical Record Number: [id number] Patient Account Number: [id number] Date of Birth/Sex: Treating RN: June 19, 1946 (75 y.o. [name] Primary Care Dominga Mcduffie: Nicholes Stairs Other Clinician: Referring Cassiopeia Florentino: Treating Shaynah Hund/Extender: Nyra Jabs in Treatment: 3 Clinic  Level of Care Assessment Items TOOL 4 Quantity Score X- 1 0 Use when only an EandM is performed on FOLLOW-UP visit ASSESSMENTS - Nursing Assessment / Reassessment X- 1 10 Reassessment of Co-morbidities (includes updates in patient status) X- 1 5 Reassessment of Adherence to Treatment Plan ASSESSMENTS - Wound and Skin A ssessment / Reassessment X - Simple Wound Assessment / Reassessment - one wound 1 5 []  - 0 Complex Wound Assessment / Reassessment - multiple wounds []  - 0 Dermatologic / Skin Assessment (not related to wound area) ASSESSMENTS - Focused Assessment X- 1 5 Circumferential Edema Measurements - multi extremities []  - 0 Nutritional Assessment / Counseling / Intervention []  - 0 Lower Extremity Assessment (monofilament, tuning fork, pulses) []  - 0 Peripheral Arterial Disease Assessment (using hand held doppler) ASSESSMENTS - Ostomy and/or Continence Assessment and Care []  - 0 Incontinence Assessment and Management []  - 0 Ostomy Care Assessment and Management (repouching, etc.) PROCESS - Coordination of Care X - Simple Patient / Family Education for ongoing care 1 15 []  - 0 Complex (extensive) Patient / Family Education for ongoing care X- 1 10 Staff obtains Programmer, systems, Records, T Results / Process Orders est []  - 0 Staff telephones HHA, Nursing Homes / Clarify orders / etc []  - 0 Routine Transfer to another Facility (non-emergent condition) []  - 0 Routine Hospital Admission (non-emergent condition) []  - 0 New Admissions / Biomedical engineer / Ordering NPWT Apligraf, etc. , []  - 0 Emergency Hospital Admission (emergent condition) X- 1 10 Simple Discharge Coordination []  - 0 Complex (extensive) Discharge Coordination PROCESS - Special Needs []  - 0 Pediatric / Minor Patient Management []  - 0 Isolation Patient Management []  - 0 Hearing / Language / Visual special needs []  - 0 Assessment of Community assistance (transportation, D/C planning, etc.) []   - 0 Additional assistance / Altered mentation []  - 0 Support Surface(s) Assessment (bed, cushion, seat, etc.) INTERVENTIONS - Wound Cleansing / Measurement X -  Simple Wound Cleansing - one wound 1 5 []  - 0 Complex Wound Cleansing - multiple wounds X- 1 5 Wound Imaging (photographs - any number of wounds) []  - 0 Wound Tracing (instead of photographs) X- 1 5 Simple Wound Measurement - one wound []  - 0 Complex Wound Measurement - multiple wounds INTERVENTIONS - Wound Dressings []  - 0 Small Wound Dressing one or multiple wounds []  - 0 Medium Wound Dressing one or multiple wounds []  - 0 Large Wound Dressing one or multiple wounds []  - 0 Application of Medications - topical []  - 0 Application of Medications - injection INTERVENTIONS - Miscellaneous []  - 0 External ear exam []  - 0 Specimen Collection (cultures, biopsies, blood, body fluids, etc.) []  - 0 Specimen(s) / Culture(s) sent or taken to Lab for analysis []  - 0 Patient Transfer (multiple staff / Civil Service fast streamer / Similar devices) []  - 0 Simple Staple / Suture removal (25 or less) []  - 0 Complex Staple / Suture removal (26 or more) []  - 0 Hypo / Hyperglycemic Management (close monitor of Blood Glucose) []  - 0 Ankle / Brachial Index (ABI) - do not check if billed separately X- 1 5 Vital Signs Has the patient been seen at the hospital within the last three years: Yes Total Score: 80 Level Of Care: New/Established - Level 3 Electronic Signature(s) Signed: 04/18/2020 5:51:03 PM By: Kela Millin Entered By: Kela Millin on 04/18/2020 14:53:31 -------------------------------------------------------------------------------- Lower Extremity Assessment Details Patient Name: Date of Service: [name] RD C. 04/18/2020 1:30 PM Medical Record Number: [id number] Patient Account Number: [id number] Date of Birth/Sex: Treating RN: Jun 12, 1946 (74 y.o. Oval Linsey Primary Care Brittaney Beaulieu: Nicholes Stairs Other  Clinician: Referring Veleria Barnhardt: Treating Jarron Curley/Extender: Mamie Laurel Weeks in Treatment: 3 Edema Assessment Assessed: Shirlyn Goltz: No] [Right: No] Edema: [Left: No] [Right: No] Calf Left: Right: Point of Measurement: cm From Medial Instep cm 41 cm Ankle Left: Right: Point of Measurement: cm From Medial Instep cm 22 cm Electronic Signature(s) Signed: 04/21/2020 5:02:18 PM By: Carlene Coria RN Entered By: Carlene Coria on 04/18/2020 13:52:45 -------------------------------------------------------------------------------- Multi Wound Chart Details Patient Name: Date of Service: [name] RD C. 04/18/2020 1:30 PM Medical Record Number: [id number] Patient Account Number: [id number] Date of Birth/Sex: Treating RN: May 20, 1946 (74 y.o. [name] Primary Care [name]: Nicholes Stairs Other Clinician: Referring Ildefonso Keaney: Treating Amir Glaus/Extender: Nyra Jabs in Treatment: 3 Vital Signs Height(in): 70 Pulse(bpm): 79 Weight(lbs): 295 Blood Pressure(mmHg): 152/73 Body Mass Index(BMI): 31 Temperature(F): 98.3 Respiratory Rate(breaths/min): 18 Photos: [1:No Photos Right Metatarsal head fifth] [N/A:N/A N/A] Wound Location: [1:Gradually Appeared] [N/A:N/A] Wounding Event: [1:Neuropathic Ulcer-Non Diabetic] [N/A:N/A] Primary Etiology: [1:Glaucoma, Chronic Obstructive] [N/A:N/A] Comorbid History: [1:Pulmonary Disease (COPD), Sleep Apnea, Hypertension, Neuropathy, Received Chemotherapy, Received Radiation 02/28/2020] [N/A:N/A] Date Acquired: [1:3] [N/A:N/A] Weeks of Treatment: [1:Healed - Epithelialized] [N/A:N/A] Wound Status: [1:0x0x0] [N/A:N/A] Measurements L x W x D (cm) [1:0] [N/A:N/A] A (cm) : rea [1:0] [N/A:N/A] Volume (cm) : [1:100.00%] [N/A:N/A] % Reduction in Area: [1:100.00%] [N/A:N/A] % Reduction in Volume: [1:Full Thickness Without Exposed] [N/A:N/A] Classification: [1:Support Structures None Present] [N/A:N/A] Exudate  Amount: [1:Flat and Intact] [N/A:N/A] Wound Margin: [1:None Present (0%)] [N/A:N/A] Granulation Amount: [1:None Present (0%)] [N/A:N/A] Necrotic Amount: [1:Fascia: No] [N/A:N/A] Exposed Structures: [1:Fat Layer (Subcutaneous Tissue) Exposed: No Tendon: No Muscle: No Joint: No Bone: No Large (67-100%)] [N/A:N/A] Treatment Notes Electronic Signature(s) Signed: 04/18/2020 5:51:03 PM By: Kela Millin Signed: 04/18/2020 6:02:27 PM By: Linton Ham MD Entered By: Linton Ham on 04/18/2020 16:51:15 --------------------------------------------------------------------------------  Multi-Disciplinary Care Plan Details Patient Name: Date of Service: [name], Caleb RD C. 04/18/2020 1:30 PM Medical Record Number: [id number] Patient Account Number: [id number] Date of Birth/Sex: Treating RN: [birth year] (74 y.o. [name] Primary Care Kanya Potteiger: Nicholes Stairs Other Clinician: Referring Cai Anfinson: Treating Christophe Rising/Extender: Nyra Jabs in Treatment: 3 Active Inactive Electronic Signature(s) Signed: 04/18/2020 5:51:03 PM By: Kela Millin Entered By: Kela Millin on 04/18/2020 14:52:56 -------------------------------------------------------------------------------- Pain Assessment Details Patient Name: Date of Service: [name] RD C. 04/18/2020 1:30 PM Medical Record Number: [id number] Patient Account Number: [id number] Date of Birth/Sex: Treating RN: 02-26-1946 (74 y.o. Oval Linsey Primary Care Jaman Aro: Nicholes Stairs Other Clinician: Referring Evanie Buckle: Treating Georgann Bramble/Extender: Nyra Jabs in Treatment: 3 Active Problems Location of Pain Severity and Description of Pain Patient Has Paino No Site Locations Pain Management and Medication Current Pain Management: Electronic Signature(s) Signed: 04/21/2020 5:02:18 PM By: Carlene Coria RN Entered By: Carlene Coria on 04/18/2020  13:52:04 -------------------------------------------------------------------------------- Patient/Caregiver Education Details Patient Name: Date of Service: [name] [place]. 4/30/2021andnbsp1:30 PM Medical Record Number: [id number] Patient Account Number: [id number] Date of Birth/Gender: Treating RN: Apr 03, 1946 (74 y.o. [name] Primary Care Physician: Nicholes Stairs Other Clinician: Referring Physician: Treating Physician/Extender: Nyra Jabs in Treatment: 3 Education Assessment Education Provided To: Patient Education Topics Provided Elevated Blood Sugar/ Impact on Healing: Handouts: Elevated Blood Sugars: How Do They Affect Wound Healing Methods: Explain/Verbal Responses: State content correctly [place]: o Handouts: Welcome T [facility] o Methods: Explain/Verbal Responses: State content correctly Wound/Skin Impairment: Handouts: Caring for Your Ulcer Methods: Explain/Verbal Responses: State content correctly Electronic Signature(s) Signed: 04/18/2020 5:51:03 PM By: Kela Millin Entered By: Kela Millin on 04/18/2020 13:41:24 -------------------------------------------------------------------------------- Wound Assessment Details Patient Name: Date of Service: [name] RD C. 04/18/2020 1:30 PM Medical Record Number: [id number] Patient Account Number: [id number] Date of Birth/Sex: Treating RN: May 14, 1946 (74 y.o. [name]) Carlene Coria Primary Care Mashayla Lavin: Nicholes Stairs Other Clinician: Referring Aleena Kirkeby: Treating Leldon Steege/Extender: Nyra Jabs in Treatment: 3 Wound Status Wound Number: 1 Primary Neuropathic Ulcer-Non Diabetic Etiology: Wound Location: Right Metatarsal head fifth Wound Healed - Epithelialized Wounding Event: Gradually Appeared Status: Date Acquired: 02/28/2020 Comorbid Glaucoma, Chronic Obstructive Pulmonary Disease (COPD),  Sleep Weeks Of Treatment: 3 History: Apnea, Hypertension, Neuropathy, Received Chemotherapy, Clustered Wound: No Received Radiation Photos Photo Uploaded By: Mikeal Hawthorne on 04/21/2020 10:06:14 Wound Measurements Length: (cm) Width: (cm) Depth: (cm) Area: (cm) Volume: (cm) 0 % Reduction in Area: 100% 0 % Reduction in Volume: 100% 0 Epithelialization: Large (67-100%) 0 Tunneling: No 0 Undermining: No Wound Description Classification: Full Thickness Without Exposed Support Structures Wound Margin: Flat and Intact Exudate Amount: None Present Foul Odor After Cleansing: No Slough/Fibrino No Wound Bed Granulation Amount: None Present (0%) Exposed Structure Necrotic Amount: None Present (0%) Fascia Exposed: No Fat Layer (Subcutaneous Tissue) Exposed: No Tendon Exposed: No Muscle Exposed: No Joint Exposed: No Bone Exposed: No Electronic Signature(s) Signed: 04/18/2020 5:51:03 PM By: Kela Millin Signed: 04/21/2020 5:02:18 PM By: Carlene Coria RN Entered By: Kela Millin on 04/18/2020 14:50:49 -------------------------------------------------------------------------------- Vitals Details Patient Name: Date of Service: [name] RD C. 04/18/2020 1:30 PM Medical Record Number: [id number] Patient Account Number: [id number] Date of Birth/Sex: Treating RN: [birth year] (74 y.o. Oval Linsey Primary Care Sumeya Yontz: Nicholes Stairs Other Clinician: Referring Jermika Olden: Treating Pluma Diniz/Extender: Nyra Jabs in Treatment: 3 Vital Signs Time  Taken: 13:49 Temperature (F): 98.3 Height (in): 70 Pulse (bpm): 79 Weight (lbs): 214 Respiratory Rate (breaths/min): 18 Body Mass Index (BMI): 30.7 Blood Pressure (mmHg): 152/73 Reference Range: 80 - 120 mg / dl Electronic Signature(s) Signed: 04/21/2020 5:02:18 PM By: Carlene Coria RN Entered By: Carlene Coria on 04/18/2020 13:51:54

## 2020-04-25 ENCOUNTER — Encounter (HOSPITAL_BASED_OUTPATIENT_CLINIC_OR_DEPARTMENT_OTHER): Payer: Medicare Other | Admitting: Internal Medicine

## 2020-05-05 ENCOUNTER — Ambulatory Visit (INDEPENDENT_AMBULATORY_CARE_PROVIDER_SITE_OTHER): Payer: Medicare Other | Admitting: Podiatry

## 2020-05-05 ENCOUNTER — Other Ambulatory Visit: Payer: Self-pay

## 2020-05-05 ENCOUNTER — Encounter: Payer: Self-pay | Admitting: Podiatry

## 2020-05-05 VITALS — Temp 98.0°F

## 2020-05-05 DIAGNOSIS — L84 Corns and callosities: Secondary | ICD-10-CM

## 2020-05-05 DIAGNOSIS — D689 Coagulation defect, unspecified: Secondary | ICD-10-CM | POA: Diagnosis not present

## 2020-05-05 DIAGNOSIS — B351 Tinea unguium: Secondary | ICD-10-CM | POA: Diagnosis not present

## 2020-05-05 DIAGNOSIS — M79675 Pain in left toe(s): Secondary | ICD-10-CM | POA: Diagnosis not present

## 2020-05-05 DIAGNOSIS — M79674 Pain in right toe(s): Secondary | ICD-10-CM

## 2020-05-05 DIAGNOSIS — M21621 Bunionette of right foot: Secondary | ICD-10-CM

## 2020-05-07 NOTE — Progress Notes (Signed)
Subjective:   Patient ID: [name], male   DOB: 74 y.o.   MRN: [id number]   HPI Patient presents concerned about a lesion underneath the right foot and callus formation that can become bothersome.  States that he was at the wound center it is better but he needs it trimmed.  Also complains of nails that are thick that he cannot take care of himself and patient does not currently smoke and likes to be active if possible   Review of Systems  All other systems reviewed and are negative.       Objective:  Physical Exam Vitals and nursing note reviewed.  Constitutional:      Appearance: He is well-developed.  Pulmonary:     Effort: Pulmonary effort is normal.  Musculoskeletal:        General: Normal range of motion.  Skin:    General: Skin is warm.  Neurological:     Mental Status: He is alert.     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

## 2020-06-11 ENCOUNTER — Encounter (HOSPITAL_BASED_OUTPATIENT_CLINIC_OR_DEPARTMENT_OTHER): Payer: Medicare Other | Attending: Physician Assistant | Admitting: Physician Assistant

## 2020-06-11 DIAGNOSIS — G9009 Other idiopathic peripheral autonomic neuropathy: Secondary | ICD-10-CM | POA: Diagnosis not present

## 2020-06-11 DIAGNOSIS — L97512 Non-pressure chronic ulcer of other part of right foot with fat layer exposed: Secondary | ICD-10-CM | POA: Insufficient documentation

## 2020-06-11 DIAGNOSIS — Z85118 Personal history of other malignant neoplasm of bronchus and lung: Secondary | ICD-10-CM | POA: Insufficient documentation

## 2020-06-11 DIAGNOSIS — Z86718 Personal history of other venous thrombosis and embolism: Secondary | ICD-10-CM | POA: Diagnosis not present

## 2020-06-11 DIAGNOSIS — Z923 Personal history of irradiation: Secondary | ICD-10-CM | POA: Diagnosis not present

## 2020-06-11 DIAGNOSIS — G4730 Sleep apnea, unspecified: Secondary | ICD-10-CM | POA: Diagnosis not present

## 2020-06-11 DIAGNOSIS — I10 Essential (primary) hypertension: Secondary | ICD-10-CM | POA: Diagnosis not present

## 2020-06-11 DIAGNOSIS — J449 Chronic obstructive pulmonary disease, unspecified: Secondary | ICD-10-CM | POA: Diagnosis not present

## 2020-06-11 DIAGNOSIS — Z9221 Personal history of antineoplastic chemotherapy: Secondary | ICD-10-CM | POA: Diagnosis not present

## 2020-06-11 DIAGNOSIS — Z87891 Personal history of nicotine dependence: Secondary | ICD-10-CM | POA: Insufficient documentation

## 2020-06-13 NOTE — Progress Notes (Signed)
[name], [name] ([id number]) Visit Report for 06/11/2020 Allergy List Details Patient Name: Date of Service: [name], [name] RD [name] 06/11/2020 2:45 PM Medical Record Number: [id number] Patient Account Number: [id number] Date of Birth/Sex: Treating RN: 1946/07/05 (74 y.o. [name] Primary Care Cheronda Erck: Nicholes Stairs Other Clinician: Referring Azekiel Cremer: Treating Lanecia Sliva/Extender: [place] in Treatment: 0 Allergies Active Allergies No Known Allergies Allergy Notes Electronic Signature(s) Signed: 06/13/2020 5:45:26 PM By: Levan Hurst RN, BSN Entered By: Levan Hurst on 06/11/2020 15:13:53 -------------------------------------------------------------------------------- Arrival Information Details Patient Name: Date of Service: [name] RD C. 06/11/2020 2:45 PM Medical Record Number: [id number] Patient Account Number: [id number] Date of Birth/Sex: Treating RN: 1946-04-22 (74 y.o. [name] Primary Care [name]: Nicholes Stairs Other Clinician: Referring [name]: Treating [name]/Extender: [place] in Treatment: 0 Visit Information Patient Arrived: Ambulatory Arrival Time: 15:09 Accompanied By: alone Transfer Assistance: None Patient Identification Verified: Yes Secondary Verification Process Completed: Yes Patient Requires Transmission-Based Precautions: No Patient Has Alerts: Yes Patient Alerts: Patient on Blood Thinner History Since Last Visit Added or deleted any medications: No Any new allergies or adverse reactions: No Had a fall or experienced change in activities of daily living that may affect risk of falls: No Signs or symptoms of abuse/neglect since last visito No Hospitalized since last visit: No Implantable device outside of the clinic excluding cellular tissue based products placed in the center since last visit: No Pain Present Now: No Electronic Signature(s) Signed: 06/13/2020  5:45:26 PM By: Levan Hurst RN, BSN Entered By: Levan Hurst on 06/11/2020 15:11:43 -------------------------------------------------------------------------------- Clinic Level of Care Assessment Details Patient Name: Date of Service: [name] RD C. 06/11/2020 2:45 PM Medical Record Number: [id number] Patient Account Number: [id number] Date of Birth/Sex: Treating RN: October 09, 1946 (74 y.o. [name] Primary Care [name]: Nicholes Stairs Other Clinician: Referring [name]: Treating [name]/Extender: [place] in Treatment: 0 Clinic Level of Care Assessment Items TOOL 1 Quantity Score []  - 0 Use when EandM and Procedure is performed on INITIAL visit ASSESSMENTS - Nursing Assessment / Reassessment X- 1 20 General Physical Exam (combine w/ comprehensive assessment (listed just below) when performed on new pt. evals) X- 1 25 Comprehensive Assessment (HX, ROS, Risk Assessments, Wounds Hx, etc.) ASSESSMENTS - Wound and Skin Assessment / Reassessment []  - 0 Dermatologic / Skin Assessment (not related to wound area) ASSESSMENTS - Ostomy and/or Continence Assessment and Care []  - 0 Incontinence Assessment and Management []  - 0 Ostomy Care Assessment and Management (repouching, etc.) PROCESS - Coordination of Care X - Simple Patient / Family Education for ongoing care 1 15 []  - 0 Complex (extensive) Patient / Family Education for ongoing care X- 1 10 Staff obtains Programmer, systems, Records, T Results / Process Orders est []  - 0 Staff telephones HHA, Nursing Homes / Clarify orders / etc []  - 0 Routine Transfer to another Facility (non-emergent condition) []  - 0 Routine Hospital Admission (non-emergent condition) X- 1 15 New Admissions / Biomedical engineer / Ordering NPWT Apligraf, etc. , []  - 0 Emergency Hospital Admission (emergent condition) PROCESS - Special Needs []  - 0 Pediatric / Minor Patient Management []  - 0 Isolation Patient  Management []  - 0 Hearing / Language / Visual special needs []  - 0 Assessment of Community assistance (transportation, D/C planning, etc.) []  - 0 Additional assistance / Altered mentation []  - 0 Support Surface(s) Assessment (bed, cushion, seat, etc.) INTERVENTIONS - Miscellaneous []  - 0  External ear exam []  - 0 Patient Transfer (multiple staff / Civil Service fast streamer / Similar devices) []  - 0 Simple Staple / Suture removal (25 or less) []  - 0 Complex Staple / Suture removal (26 or more) []  - 0 Hypo/Hyperglycemic Management (do not check if billed separately) X- 1 15 Ankle / Brachial Index (ABI) - do not check if billed separately Has the patient been seen at the hospital within the last three years: Yes Total Score: 100 Level Of Care: New/Established - Level 3 Electronic Signature(s) Signed: 06/11/2020 4:25:30 PM By: Baruch Gouty RN, BSN Entered By: Baruch Gouty on 06/11/2020 16:13:29 -------------------------------------------------------------------------------- Encounter Discharge Information Details Patient Name: Date of Service: [name] RD C. 06/11/2020 2:45 PM Medical Record Number: [id number] Patient Account Number: [id number] Date of Birth/Sex: Treating RN: 08-15-1946 (74 y.o. [name] Primary Care [name]: Nicholes Stairs Other Clinician: Referring [name]: Treating [name]/Extender: [place] in Treatment: 0 Encounter Discharge Information Items Post Procedure Vitals Discharge Condition: Stable Temperature (F): 98.4 Ambulatory Status: Ambulatory Pulse (bpm): 80 Discharge Destination: Home Respiratory Rate (breaths/min): 18 Transportation: Private Auto Blood Pressure (mmHg): 129/80 Accompanied By: wife Schedule Follow-up Appointment: Yes Clinical Summary of Care: Patient Declined Electronic Signature(s) Signed: 06/11/2020 4:51:47 PM By: Caleb Coria RN Entered By: [name] on 06/11/2020  16:35:36 -------------------------------------------------------------------------------- Lower Extremity Assessment Details Patient Name: Date of Service: [name], [name] RD C. 06/11/2020 2:45 PM Medical Record Number: [id number] Patient Account Number: [id number] Date of Birth/Sex: Treating RN: 08/23/1946 (74 y.o. [name] Primary Care Symia Herdt: Nicholes Stairs Other Clinician: Referring Ariabella Brien: Treating Michaela Broski/Extender: [place] in Treatment: 0 Edema Assessment Assessed: Shirlyn Goltz: No] [Right: No] Edema: [Left: N] [Right: o] Calf Left: Right: Point of Measurement: cm From Medial Instep cm 40.4 cm Ankle Left: Right: Point of Measurement: cm From Medial Instep cm 22.5 cm Vascular Assessment Pulses: Dorsalis Pedis Palpable: [Right:Yes] Blood Pressure: Brachial: [Right:129] Ankle: [Right:Dorsalis Pedis: 144 1.12] Electronic Signature(s) Signed: 06/13/2020 5:45:26 PM By: Levan Hurst RN, BSN Entered By: Levan Hurst on 06/11/2020 15:30:49 -------------------------------------------------------------------------------- Multi-Disciplinary Care Plan Details Patient Name: Date of Service: [name] RD C. 06/11/2020 2:45 PM Medical Record Number: [id number] Patient Account Number: [id number] Date of Birth/Sex: Treating RN: 1946/01/26 (74 y.o. [name] Primary Care Deborahann Poteat: Nicholes Stairs Other Clinician: Referring Fatema Rabe: Treating Carvel Huskins/Extender: [place] in Treatment: 0 Active Inactive Peripheral Neuropathy Nursing Diagnoses: Knowledge deficit related to disease process and management of peripheral neurovascular dysfunction Potential alteration in peripheral tissue perfusion (select prior to confirmation of diagnosis) Goals: Patient/caregiver will verbalize understanding of disease process and disease management Date Initiated: 06/11/2020 Target Resolution Date: 07/09/2020 Goal Status:  Active Interventions: Assess signs and symptoms of neuropathy upon admission and as needed Provide education on Management of Neuropathy and Related Ulcers Provide education on Management of Neuropathy upon discharge from the [facility] Notes: Wound/Skin Impairment Nursing Diagnoses: Impaired tissue integrity Knowledge deficit related to ulceration/compromised skin integrity Goals: Patient/caregiver will verbalize understanding of skin care regimen Date Initiated: 06/11/2020 Target Resolution Date: 07/09/2020 Goal Status: Active Ulcer/skin breakdown will have a volume reduction of 30% by week 4 Date Initiated: 06/11/2020 Target Resolution Date: 07/09/2020 Goal Status: Active Interventions: Assess patient/caregiver ability to obtain necessary supplies Assess patient/caregiver ability to perform ulcer/skin care regimen upon admission and as needed Assess ulceration(s) every visit Provide education on ulcer and skin care Treatment Activities: Skin care regimen initiated : 06/11/2020 Topical wound management  initiated : 06/11/2020 Notes: Electronic Signature(s) Signed: 06/11/2020 4:25:30 PM By: Baruch Gouty RN, BSN Entered By: Baruch Gouty on 06/11/2020 16:12:44 -------------------------------------------------------------------------------- Pain Assessment Details Patient Name: Date of Service: [name] RD C. 06/11/2020 2:45 PM Medical Record Number: [id number] Patient Account Number: [id number] Date of Birth/Sex: Treating RN: February 01, 1946 (74 y.o. [name] Primary Care Shelise Maron: Nicholes Stairs Other Clinician: Referring Delray Reza: Treating Kymiah Araiza/Extender: [place] in Treatment: 0 Active Problems Location of Pain Severity and Description of Pain Patient Has Paino No Site Locations Pain Management and Medication Current Pain Management: Electronic Signature(s) Signed: 06/13/2020 5:45:26 PM By: Levan Hurst RN, BSN Entered By:  Levan Hurst on 06/11/2020 15:30:58 -------------------------------------------------------------------------------- Patient/Caregiver Education Details Patient Name: Date of Service: [name] RD [name] 6/23/2021andnbsp2:45 PM Medical Record Number: [id number] Patient Account Number: [id number] Date of Birth/Gender: Treating RN: 1946-03-27 (74 y.o. [name] Primary Care Physician: Nicholes Stairs Other Clinician: Referring Physician: Treating Physician/Extender: [place] in Treatment: 0 Education Assessment Education Provided To: Patient Education Topics Provided Peripheral Neuropathy: Methods: Explain/Verbal Responses: Reinforcements needed, State content correctly Wound/Skin Impairment: Methods: Explain/Verbal Responses: Reinforcements needed, State content correctly Electronic Signature(s) Signed: 06/11/2020 4:25:30 PM By: Baruch Gouty RN, BSN Entered By: Baruch Gouty on 06/11/2020 16:12:59 -------------------------------------------------------------------------------- Wound Assessment Details Patient Name: Date of Service: [name] RD C. 06/11/2020 2:45 PM Medical Record Number: [id number] Patient Account Number: [id number] Date of Birth/Sex: Treating RN: 1946/08/05 (74 y.o. [name] Primary Care Bekki Tavenner: Nicholes Stairs Other Clinician: Referring Sharhonda Atwood: Treating Keiko Myricks/Extender: [place] in Treatment: 0 Wound Status Wound Number: 2 Primary Neuropathic Ulcer-Non Diabetic Etiology: Wound Location: Right Metatarsal head fifth Wound Open Wounding Event: Gradually Appeared Status: Date Acquired: 06/07/2020 Comorbid Glaucoma, Chronic Obstructive Pulmonary Disease (COPD), Sleep Weeks Of Treatment: 0 History: Apnea, Hypertension, Neuropathy, Received Chemotherapy, Clustered Wound: No Received Radiation Photos Wound Measurements Length: (cm) 0.7 Width: (cm) 1.1 Depth: (cm)  0.1 Area: (cm) 0.605 Volume: (cm) 0.06 % Reduction in Area: 0% % Reduction in Volume: 0% Epithelialization: None Tunneling: No Undermining: No Wound Description Classification: Full Thickness Without Exposed Support Structures Wound Margin: Flat and Intact Exudate Amount: Medium Exudate Type: Serosanguineous Exudate Color: red, brown Foul Odor After Cleansing: No Slough/Fibrino No Wound Bed Granulation Amount: Large (67-100%) Exposed Structure Granulation Quality: Pink Fascia Exposed: No Necrotic Amount: None Present (0%) Fat Layer (Subcutaneous Tissue) Exposed: Yes Tendon Exposed: No Muscle Exposed: No Joint Exposed: No Bone Exposed: No Treatment Notes Wound #2 (Right Metatarsal head fifth) 1. Cleanse With Wound Cleanser 3. Primary Dressing Applied Calcium Alginate Ag 4. Secondary Dressing Dry Gauze Roll Gauze 5. Secured With Recruitment consultant) Signed: 06/11/2020 5:20:20 PM By: Minerva Fester Signed: 06/13/2020 5:45:26 PM By: Levan Hurst RN, BSN Entered By: Minerva Fester on 06/11/2020 16:05:57 -------------------------------------------------------------------------------- [place] Details Patient Name: Date of Service: [name] RD C. 06/11/2020 2:45 PM Medical Record Number: [id number] Patient Account Number: [id number] Date of Birth/Sex: Treating RN: 1946/02/14 (74 y.o. [name] Primary Care Dalbert Stillings: Nicholes Stairs Other Clinician: Referring Sohaib Vereen: Treating Bunnie Lederman/Extender: [place] in Treatment: 0 Vital Signs Time Taken: 15:13 Temperature (F): 98.4 Height (in): 70 Pulse (bpm): 80 Source: Stated Respiratory Rate (breaths/min): 18 Weight (lbs): 214 Blood Pressure (mmHg): 129/80 Source: Stated Reference Range: 80 - 120 mg / dl Body Mass Index (BMI): 30.7 Electronic Signature(s) Signed: 06/13/2020 5:45:26 PM By: Levan Hurst RN, BSN Entered By:  Levan Hurst on 06/11/2020 15:13:50

## 2020-06-13 NOTE — Progress Notes (Signed)
[name], [name] ([id number]) Visit Report for 06/11/2020 Abuse/Suicide Risk Screen Details Patient Name: Date of Service: [name], [name] 06/11/2020 2:45 PM Medical Record Number: [id number] Patient Account Number: [id number] Date of Birth/Sex: Treating RN: Nov 18, 1946 (74 y.o. [name] Primary Care Lelend Heinecke: Nicholes Stairs Other Clinician: Referring Kaziyah Parkison: Treating Baird Polinski/Extender: [place] in Treatment: 0 Abuse/Suicide Risk Screen Items Answer ABUSE RISK SCREEN: Has anyone close to you tried to hurt or harm you recentlyo No Do you feel uncomfortable with anyone in your familyo No Has anyone forced you do things that you didnt want to doo No Electronic Signature(s) Signed: 06/13/2020 5:45:26 PM By: Levan Hurst RN, BSN Entered By: Levan Hurst on 06/11/2020 15:18:40 -------------------------------------------------------------------------------- Activities of Daily Living Details Patient Name: Date of Service: [name], Caleb RD C. 06/11/2020 2:45 PM Medical Record Number: [id number] Patient Account Number: [id number] Date of Birth/Sex: Treating RN: October 10, 1946 (74 y.o. [name] Primary Care Tej Murdaugh: Nicholes Stairs Other Clinician: Referring Camreigh Michie: Treating Tracey Hermance/Extender: [place] in Treatment: 0 Activities of Daily Living Items Answer Activities of Daily Living (Please select one for each item) Drive Automobile Completely Able T Medications ake Completely Able Use T elephone Completely Able Care for Appearance Completely Able Use T oilet Completely Able Bath / Shower Completely Able Dress Self Completely Able Feed Self Completely Able Walk Completely Able Get In / Out Bed Completely Able Housework Completely Able Prepare Meals Completely [place] for Self Completely Able Electronic Signature(s) Signed: 06/13/2020 5:45:26 PM By: Levan Hurst RN,  BSN Entered By: Levan Hurst on 06/11/2020 15:19:05 -------------------------------------------------------------------------------- Education Screening Details Patient Name: Date of Service: Caleb Alken RD C. 06/11/2020 2:45 PM Medical Record Number: [id number] Patient Account Number: [id number] Date of Birth/Sex: Treating RN: 1946-11-13 (74 y.o. [name] Primary Care Avion Kutzer: Nicholes Stairs Other Clinician: Referring Donette Mainwaring: Treating Cesilia Shinn/Extender: [place] in Treatment: 0 Primary Learner Assessed: Patient Learning Preferences/Education Level/Primary Language Learning Preference: Explanation, Demonstration, Printed Material Highest Education Level: College or Above Preferred Language: English Cognitive Barrier Language Barrier: No Translator Needed: No Memory Deficit: No Emotional Barrier: No Cultural/Religious Beliefs Affecting Medical Care: No Physical Barrier Impaired Vision: No Impaired Hearing: No Decreased Hand dexterity: No Knowledge/Comprehension Knowledge Level: High Comprehension Level: High Ability to understand written instructions: High Ability to understand verbal instructions: High Motivation Anxiety Level: Calm Cooperation: Cooperative Education Importance: Acknowledges Need Interest in Health Problems: Asks Questions Perception: Coherent Willingness to Engage in Self-Management High Activities: Readiness to Engage in Self-Management High Activities: Electronic Signature(s) Signed: 06/13/2020 5:45:26 PM By: Levan Hurst RN, BSN Entered By: Levan Hurst on 06/11/2020 15:20:15 -------------------------------------------------------------------------------- Fall Risk Assessment Details Patient Name: Date of Service: Caleb Alken RD C. 06/11/2020 2:45 PM Medical Record Number: [id number] Patient Account Number: [id number] Date of Birth/Sex: Treating RN: 02/13/1946 (74 y.o. [name] Primary  Care Valor Quaintance: Nicholes Stairs Other Clinician: Referring Ulices Maack: Treating Cora Brierley/Extender: [place] in Treatment: 0 Fall Risk Assessment Items Have you had 2 or more falls in the last 12 monthso 0 No Have you had any fall that resulted in injury in the last 12 monthso 0 No FALLS RISK SCREEN History of falling - immediate or within 3 months 0 No Secondary diagnosis (Do you have 2 or more medical diagnoseso) 15 Yes Ambulatory aid None/bed rest/wheelchair/nurse 0 Yes Crutches/cane/walker 0 No Furniture 0 No Intravenous therapy Access/Saline/Heparin [name]  0 No Gait/Transferring Normal/ bed rest/ wheelchair 0 Yes Weak (short steps with or without shuffle, stooped but able to lift head while walking, may seek 0 No support from furniture) Impaired (short steps with shuffle, may have difficulty arising from chair, head down, impaired 0 No balance) Mental Status Oriented to own ability 0 Yes Electronic Signature(s) Signed: 06/13/2020 5:45:26 PM By: Levan Hurst RN, BSN Entered By: Levan Hurst on 06/11/2020 15:20:30 -------------------------------------------------------------------------------- Foot Assessment Details Patient Name: Date of Service: Caleb Alken RD C. 06/11/2020 2:45 PM Medical Record Number: [id number] Patient Account Number: [id number] Date of Birth/Sex: Treating RN: 06-17-1946 (74 y.o. [name] Primary Care Kiwana Deblasi: Nicholes Stairs Other Clinician: Referring Ulanda Tackett: Treating Meighan Treto/Extender: [place] in Treatment: 0 Foot Assessment Items Site Locations + = Sensation present, - = Sensation absent, C = Callus, U = Ulcer R = Redness, W = Warmth, M = Maceration, PU = Pre-ulcerative lesion F = Fissure, S = Swelling, D = Dryness Assessment Right: Left: Other Deformity: No No Prior Foot Ulcer: No No Prior Amputation: No No Charcot Joint: No No Ambulatory Status: Ambulatory Without  Help Gait: Steady Electronic Signature(s) Signed: 06/13/2020 5:45:26 PM By: Levan Hurst RN, BSN Entered By: Levan Hurst on 06/11/2020 15:21:02 -------------------------------------------------------------------------------- Nutrition Risk Screening Details Patient Name: Date of Service: Caleb Alken RD C. 06/11/2020 2:45 PM Medical Record Number: [id number] Patient Account Number: [id number] Date of Birth/Sex: Treating RN: 1946-05-26 (74 y.o. [name] Primary Care Keithon Mccoin: Nicholes Stairs Other Clinician: Referring Irina Okelly: Treating Dasha Kawabata/Extender: [place] in Treatment: 0 Height (in): 70 Weight (lbs): 214 Body Mass Index (BMI): 30.7 Nutrition Risk Screening Items Score Screening NUTRITION RISK SCREEN: I have an illness or condition that made me change the kind and/or amount of food I eat 0 No I eat fewer than two meals per day 0 No I eat few fruits and vegetables, or milk products 0 No I have three or more drinks of beer, liquor or wine almost every day 0 No I have tooth or mouth problems that make it hard for me to eat 0 No I don't always have enough money to buy the food I need 0 No I eat alone most of the time 0 No I take three or more different prescribed or over-the-counter drugs a day 1 Yes Without wanting to, I have lost or gained 10 pounds in the last six months 0 No I am not always physically able to shop, cook and/or feed myself 0 No Nutrition Protocols Good Risk Protocol Moderate Risk Protocol 0 Provide education on nutrition High Risk Proctocol Risk Level: Good Risk Score: 1 Electronic Signature(s) Signed: 06/13/2020 5:45:26 PM By: Levan Hurst RN, BSN Entered By: Levan Hurst on 06/11/2020 15:20:37

## 2020-06-13 NOTE — Progress Notes (Signed)
Caleb, Everett ([id number]) Visit Report for 06/11/2020 Chief Complaint Document Details Patient Name: Date of Service: [name], [name] Loletha Everett 06/11/2020 2:45 PM Medical Record Number: [id number] Patient Account Number: [id number] Date of Birth/Sex: Treating RN: [birth year] (74 y.o. [name] Primary Care Provider: Nicholes Stairs Other Clinician: Referring Provider: Treating Provider/Extender: Dominga Ferry in Treatment: 0 Information Obtained from: Patient Chief Complaint Wound exam; patient is here for review of the wound on the right fifth met metatarsal head Electronic Signature(s) Signed: 06/11/2020 4:09:30 PM By: Worthy Keeler PA-C Entered By: Worthy Keeler on 06/11/2020 16:09:30 -------------------------------------------------------------------------------- Debridement Details Patient Name: Date of Service: [name] [name] 06/11/2020 2:45 PM Medical Record Number: [id number] Patient Account Number: [id number] Date of Birth/Sex: Treating RN: January 26, 1946 (74 y.o. [name] Primary Care Provider: Nicholes Stairs Other Clinician: Referring Provider: Treating Provider/Extender: Dominga Ferry in Treatment: 0 Debridement Performed for Assessment: Wound #2 Right Metatarsal head fifth Performed By: Physician Worthy Keeler, PA Debridement Type: Debridement Level of Consciousness (Pre-procedure): Awake and Alert Pre-procedure Verification/Time Out Yes - 16:10 Taken: Start Time: 16:11 Pain Control: Lidocaine 5% topical ointment T Area Debrided (L x W): otal 2 (cm) x 2.5 (cm) = 5 (cm) Tissue and other material debrided: Viable, Non-Viable, Callus, Subcutaneous, Skin: Epidermis Level: Skin/Subcutaneous Tissue Debridement Description: Excisional Instrument: Curette Bleeding: Minimum Hemostasis Achieved: Pressure End Time: 16:18 Procedural Pain: 0 Post Procedural Pain: 0 Response to Treatment: Procedure was  tolerated well Level of Consciousness (Post- Awake and Alert procedure): Post Debridement Measurements of Total Wound Length: (cm) 0.9 Width: (cm) 1.3 Depth: (cm) 0.1 Volume: (cm) 0.092 Character of Wound/Ulcer Post Debridement: Improved Post Procedure Diagnosis Same as Pre-procedure Electronic Signature(s) Signed: 06/11/2020 4:25:30 PM By: Baruch Gouty RN, BSN Signed: 06/13/2020 3:47:20 PM By: Worthy Keeler PA-C Entered By: Baruch Gouty on 06/11/2020 16:18:42 -------------------------------------------------------------------------------- HPI Details Patient Name: Date of Service: [name] [name] 06/11/2020 2:45 PM Medical Record Number: [id number] Patient Account Number: [id number] Date of Birth/Sex: Treating RN: 1946/05/26 (74 y.o. [name] Primary Care Provider: Nicholes Stairs Other Clinician: Referring Provider: Treating Provider/Extender: Dominga Ferry in Treatment: 0 History of Present Illness HPI Description: ADMISSION 03/28/2020 This is a 74 year old retired man who has a history of neuropathy nondiabetic apparently felt to be secondary to chemotherapy he received this part of treatment for adeno CA of the lung many years ago. Apparently this started about a month ago. He was seen in the walk-in clinic and had cellulitis. Noted to have a draining wound on the plantar aspect of his right foot. He was given Rocephin and treated with doxycycline. And follow-up with primary care culture showed MRSA also resistant to quinolones. He was given doxycycline and clindamycin. When he was seen in follow-up on March 22 his doxycycline was refilled. The wound looked better from an infection point of view. He has finished his doxycycline. He has been using Bactroban to the wound not offloading this at all. He walks around in bare feet in the house. 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 noncompressible and 1.33 on the left also noncompressible 4/16; patient with a nondiabetic idiopathic peripheral  neuropathy. Neuropathic wound presented to the clinic last week on the plantar right fifth met head. This became secondarily infected treated by his primary physician and walk-in clinics. He has not shown any evidence of infection. We gave him a forefoot offloading boot. Fortunately he was able to manage to work with this. He arrives in the clinic today with this wound almost healed. We were using silver collagen 4/23; wound on the plantar aspect of the right fifth metatarsal head. I thought this might be close this week however it is not. We have been using silver alginate 4/30; the wound on the plantar aspect of the fifth metatarsal head in a man with a idiopathic nondiabetic peripheral neuropathy. The area is closed this week. He still has a lot of callus around this wound indicative of pressure/friction when he walks. Readmission: 06/11/2020 upon evaluation today patient presents for reevaluation here in our clinic concerning a reopening of the wound on the lateral portion of his foot which is in the same location where we previously took care of him back in April of this year. Fortunately there does not appear to be any signs of active infection at this time it does appear that he developed a blister that unfortunately opened over the weekend after they called in concerned about this wanting to get in sooner rather than later. With that being said he did see his primary care provider this past Monday that had a culture that has not come back yet. With that being said there does not appear to be any signs of active infection at this time and overall I feel like that is really not  a significant issue currently. With regard to the antibiotics his primary care provider did not give him any antibiotics as they did not feel it was infected either. Electronic Signature(s) Signed: 06/11/2020 4:44:35 PM By: Worthy Keeler PA-C Entered By: Worthy Keeler on 06/11/2020 16:44:34 -------------------------------------------------------------------------------- Physical Exam Details Patient Name: Date of Service: THEOREN, PALKA [name] 06/11/2020 2:45 PM Medical Record Number: [id number] Patient Account Number: [id number] Date of Birth/Sex: Treating RN: 1945-12-25 (74 y.o. [name] Primary Care Provider: Nicholes Stairs Other Clinician: Referring Provider: Treating Provider/Extender: Dominga Ferry in Treatment: 0 Constitutional sitting or standing blood pressure is within target range for patient.. pulse regular and within target range for patient.Marland Kitchen respirations regular, non-labored and within target range for patient.Marland Kitchen temperature within target range for patient.. Well-nourished and well-hydrated in no acute distress. Eyes conjunctiva clear no eyelid edema noted. pupils equal round and reactive to light and accommodation. Ears, Nose, Mouth, and Throat no gross abnormality of ear auricles or external auditory canals. normal hearing noted during conversation. mucus membranes moist. Respiratory normal breathing without difficulty. Cardiovascular 2+ dorsalis pedis/posterior tibialis pulses. no clubbing, cyanosis, significant edema, <3 sec cap refill. Musculoskeletal normal gait and posture. no significant deformity or arthritic changes, no loss or range of motion, no clubbing. Psychiatric this patient is able to make decisions and demonstrates good insight into disease process. Alert and Oriented x 3. pleasant and cooperative. Notes Upon inspection patient's wound actually appeared to be mainly a fairly superficial blister at this point there did  not appear to be any signs of active infection which is great news and overall the patient seems to be doing quite well which is also great news. I am pleased with the fact that at least we just had removed a minimal amount of slough as well as blistered  tissue and really this is not a deep wound at all I think that he has a good chance of getting this to heal rather rapidly. Electronic Signature(s) Signed: 06/11/2020 4:45:15 PM By: Worthy Keeler PA-C Entered By: Worthy Keeler on 06/11/2020 16:45:14 -------------------------------------------------------------------------------- Physician Orders Details Patient Name: Date of Service: [name] [name] 06/11/2020 2:45 PM Medical Record Number: [id number] Patient Account Number: [id number] Date of Birth/Sex: Treating RN: [birth year] (74 y.o. [name] Primary Care Provider: Nicholes Stairs Other Clinician: Referring Provider: Treating Provider/Extender: Dominga Ferry in Treatment: 0 Verbal / Phone Orders: No Diagnosis Coding ICD-10 Coding Code Description G90.09 Other idiopathic peripheral autonomic neuropathy L97.512 Non-pressure chronic ulcer of other part of right foot with fat layer exposed I10 Essential (primary) hypertension Follow-up Appointments Return Appointment in 1 week. Dressing Change Frequency Wound #2 Right Metatarsal head fifth Change Dressing every other day. Wound Cleansing Wound #2 Right Metatarsal head fifth May shower and wash wound with soap and water. Primary Wound Dressing Wound #2 Right Metatarsal head fifth Calcium Alginate with Silver Secondary Dressing Wound #2 Right Metatarsal head fifth Kerlix/Rolled Gauze Dry Gauze Off-Loading Open toe surgical shoe to: - right foot with felt to help offload (pt has at home) Electronic Signature(s) Signed: 06/11/2020 4:25:30 PM By: Baruch Gouty RN, BSN Signed: 06/13/2020 3:47:20 PM By: Worthy Keeler PA-C Entered By:  Baruch Gouty on 06/11/2020 16:21:33 -------------------------------------------------------------------------------- Problem List Details Patient Name: Date of Service: [name] [name] 06/11/2020 2:45 PM Medical Record Number: [id number] Patient Account Number: [id number] Date of Birth/Sex: Treating RN: Apr 28, 1946 (74 y.o. [name] Primary Care Provider: Nicholes Stairs Other Clinician: Referring Provider: Treating Provider/Extender: Dominga Ferry in Treatment: 0 Active Problems ICD-10 Encounter Code Description Active Date MDM Diagnosis G90.09 Other idiopathic peripheral autonomic neuropathy 06/11/2020 No Yes L97.512 Non-pressure chronic ulcer of other part of right foot with fat layer exposed 06/11/2020 No Yes I10 Essential (primary) hypertension 06/11/2020 No Yes Inactive Problems Resolved Problems Electronic Signature(s) Signed: 06/11/2020 4:09:24 PM By: Worthy Keeler PA-C Previous Signature: 06/11/2020 3:52:27 PM Version By: Worthy Keeler PA-C Entered By: Worthy Keeler on 06/11/2020 16:09:23 -------------------------------------------------------------------------------- Progress Note Details Patient Name: Date of Service: [name] [name] 06/11/2020 2:45 PM Medical Record Number: [id number] Patient Account Number: [id number] Date of Birth/Sex: Treating RN: December 16, 1946 (74 y.o. [name] Primary Care Provider: Nicholes Stairs Other Clinician: Referring Provider: Treating Provider/Extender: Dominga Ferry in Treatment: 0 Subjective Chief Complaint Information obtained from Patient Wound exam; patient is here for review of the wound on the right fifth met metatarsal head History of Present Illness (HPI) ADMISSION 03/28/2020 This is a 74 year old retired man who has a history of neuropathy nondiabetic apparently felt to be secondary to chemotherapy he received this part of treatment for adeno CA of  the lung many years ago. Apparently this started about a month ago. He was seen in the walk-in clinic and had cellulitis. Noted to have a draining wound on the plantar aspect of his right foot. He was given Rocephin and treated with doxycycline. And follow-up with primary care culture showed MRSA also resistant to quinolones. He was given doxycycline and clindamycin. When he was seen in follow-up on March 22 his doxycycline was refilled. The wound looked better from an infection point of view. He has finished his doxycycline. He has been using Bactroban to the wound not offloading this  at all. He walks around in bare feet in the house. 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 noncompressible and 1.33 on the left also noncompressible 4/16; patient with a nondiabetic idiopathic peripheral neuropathy. Neuropathic wound presented to the clinic last week on the plantar right fifth met head. This became secondarily infected treated by his primary physician and walk-in clinics. He has not shown any evidence of infection. We gave him a forefoot offloading boot. Fortunately he was able to manage to work with this. He arrives in the clinic today with this wound almost healed. We were using silver collagen 4/23; wound on the plantar aspect of the right fifth metatarsal head. I thought this might be close this week however it is not. We have been using silver alginate 4/30; the wound on the plantar aspect of the fifth metatarsal head in a man with a idiopathic nondiabetic peripheral neuropathy. The area is closed this week. He still has a lot of callus around this wound indicative of pressure/friction when he walks. Readmission: 06/11/2020 upon  evaluation today patient presents for reevaluation here in our clinic concerning a reopening of the wound on the lateral portion of his foot which is in the same location where we previously took care of him back in April of this year. Fortunately there does not appear to be any signs of active infection at this time it does appear that he developed a blister that unfortunately opened over the weekend after they called in concerned about this wanting to get in sooner rather than later. With that being said he did see his primary care provider this past Monday that had a culture that has not come back yet. With that being said there does not appear to be any signs of active infection at this time and overall I feel like that is really not a significant issue currently. With regard to the antibiotics his primary care provider did not give him any antibiotics as they did not feel it was infected either. Patient History Information obtained from Patient. Allergies No Known Allergies Family History Cancer - Mother, Heart Disease - Father,Siblings, Hypertension - Mother,Father,Siblings, No family history of Diabetes, Hereditary Spherocytosis, Kidney Disease, Lung Disease, Seizures, Stroke, Thyroid Problems, Tuberculosis. Social History Former smoker - quit 2005, Marital Status - Married, Alcohol Use - Daily - bottle wine per day, Drug Use - No History, Caffeine Use - Daily - coffee. Medical History Eyes Patient has history of Glaucoma - mild Denies history of Cataracts, Optic Neuritis Respiratory Patient has history of Chronic Obstructive Pulmonary Disease (COPD), Sleep Apnea - no CPAP Cardiovascular Patient has history of Hypertension Neurologic Patient has history of Neuropathy Oncologic Patient has history of Received Chemotherapy, Received Radiation Psychiatric Denies history of Anorexia/bulimia, Confinement Anxiety Hospitalization/Surgery History - left inguinal hernia repair. - lobectomy  right and left lung. - mouth cancer surgery. - tonsilectomy. 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 Ear/Nose/Mouth/Throat Denies complaints or symptoms of Chronic sinus  problems or rhinitis. Gastrointestinal Denies complaints or symptoms of Frequent diarrhea, Nausea, Vomiting. Endocrine Denies complaints or symptoms of Heat/cold intolerance. Genitourinary Denies complaints or symptoms of Frequent urination. Integumentary (Skin) Complains or has symptoms of Wounds - wound on right foot. Musculoskeletal Denies complaints or symptoms of Muscle Pain, Muscle Weakness. Psychiatric Denies complaints or symptoms of Claustrophobia, Suicidal. Objective Constitutional sitting or standing blood pressure is within target range for patient.. pulse regular and within target range for patient.Marland Kitchen respirations regular, non-labored and within target range for patient.Marland Kitchen temperature within target range for patient.. Well-nourished and well-hydrated in no acute distress. Vitals Time Taken: 3:13 PM, Height: 70 in, Source: Stated, Weight: 214 lbs, Source: Stated, BMI: 30.7, Temperature: 98.4 F, Pulse: 80 bpm, Respiratory Rate: 18 breaths/min, Blood Pressure: 129/80 mmHg. Eyes conjunctiva clear no eyelid edema noted. pupils equal round and reactive to light and accommodation. Ears, Nose, Mouth, and Throat no gross abnormality of ear auricles or external auditory canals. normal hearing noted during conversation. mucus membranes moist. Respiratory normal breathing without difficulty. Cardiovascular 2+ dorsalis pedis/posterior tibialis  pulses. no clubbing, cyanosis, significant edema, Musculoskeletal normal gait and posture. no significant deformity or arthritic changes, no loss or range of motion, no clubbing. Psychiatric this patient is able to make decisions and demonstrates good insight into disease process. Alert and Oriented x 3. pleasant and cooperative. General Notes: Upon inspection patient's wound actually appeared to be mainly a fairly superficial blister at this point there did not appear to be any signs of active infection which is great news and overall the patient seems to be doing quite well which is also great news. I am pleased with the fact that at least we just had removed a minimal amount of slough as well as blistered tissue and really this is not a deep wound at all I think that he has a good chance of getting this to heal rather rapidly. Integumentary (Hair, Skin) Wound #2 status is Open. Original cause of wound was Gradually Appeared. The wound is located on the Right Metatarsal head fifth. The wound measures 0.7cm length x 1.1cm width x 0.1cm depth; 0.605cm^2 area and 0.06cm^3 volume. There is Fat Layer (Subcutaneous Tissue) Exposed exposed. There is no tunneling or undermining noted. There is a medium amount of serosanguineous drainage noted. The wound margin is flat and intact. There is large (67-100%) pink granulation within the wound bed. There is no necrotic tissue within the wound bed. Assessment Active Problems ICD-10 Other idiopathic peripheral autonomic neuropathy Non-pressure chronic ulcer of other part of right foot with fat layer exposed Essential (primary) hypertension Procedures Wound #2 Pre-procedure diagnosis of Wound #2 is a Neuropathic Ulcer-Non Diabetic located on the Right Metatarsal head fifth . There was a Excisional Skin/Subcutaneous Tissue Debridement with a total area of 5 sq cm performed by Worthy Keeler, PA. With the following instrument(s): Curette to remove Viable and  Non-Viable tissue/material. Material removed includes Callus, Subcutaneous Tissue, and Skin: Epidermis after achieving pain control using Lidocaine 5% topical ointment. No specimens were taken. A time out was conducted at 16:10, prior to the start of the procedure. A Minimum amount of bleeding was controlled with Pressure. The procedure was tolerated well with a pain level of 0 throughout and a pain level of 0 following the procedure. Post Debridement Measurements: 0.9cm length x 1.3cm width x 0.1cm depth; 0.092cm^3 volume. Character of Wound/Ulcer Post Debridement is improved. Post procedure Diagnosis Wound #2: Same as Pre-Procedure Plan Follow-up Appointments: Return Appointment in 1 week.  Dressing Change Frequency: Wound #2 Right Metatarsal head fifth: Change Dressing every other day. Wound Cleansing: Wound #2 Right Metatarsal head fifth: May shower and wash wound with soap and water. Primary Wound Dressing: Wound #2 Right Metatarsal head fifth: Calcium Alginate with Silver Secondary Dressing: Wound #2 Right Metatarsal head fifth: Kerlix/Rolled Gauze Dry Gauze Off-Loading: Open toe surgical shoe to: - right foot with felt to help offload (pt has at home) 1. I would recommend currently that we go ahead and initiate treatment with a silver alginate dressing. I think this is probably the best option for the patient at this time. 2. I am also can recommend currently that we continue with an offloading shoe he actually has this at home and in the past did very well with this. 3. I would also suggest that the patient continue to monitor for any signs of worsening or infection but I do not see anything at this point we will see what the results of the culture are when the return from his primary care provider. We will see patient back for reevaluation in 1 week here in the clinic. If anything worsens or changes patient will contact our office for additional recommendations. Electronic  Signature(s) Signed: 06/11/2020 4:51:43 PM By: Worthy Keeler PA-C Entered By: Worthy Keeler on 06/11/2020 16:51:42 -------------------------------------------------------------------------------- HxROS Details Patient Name: Date of Service: [name] [name] 06/11/2020 2:45 PM Medical Record Number: [id number] Patient Account Number: [id number] Date of Birth/Sex: Treating RN: [birth year] (74 y.o. [name] Primary Care Provider: Nicholes Stairs Other Clinician: Referring Provider: Treating Provider/Extender: Dominga Ferry in Treatment: 0 Information Obtained From Patient Constitutional Symptoms (General Health) Complaints and Symptoms: Negative for: Fatigue; Fever; Chills; Marked Weight Change Ear/Nose/Mouth/Throat Complaints and Symptoms: Negative for: Chronic sinus problems or rhinitis Gastrointestinal Complaints and Symptoms: Negative for: Frequent diarrhea; Nausea; Vomiting Medical History: Past Medical History Notes: ischemic colitis Endocrine Complaints and Symptoms: Negative for: Heat/cold intolerance Genitourinary Complaints and Symptoms: Negative for: Frequent urination Integumentary (Skin) Complaints and Symptoms: Positive for: Wounds - wound on right foot Musculoskeletal Complaints and Symptoms: Negative for: Muscle Pain; Muscle Weakness Medical History: Past Medical History Notes: hx polio as child Psychiatric Complaints and Symptoms: Negative for: Claustrophobia; Suicidal Medical History: Negative for: Anorexia/bulimia; Confinement Anxiety Eyes Medical History: Positive for: Glaucoma - mild Negative for: Cataracts; Optic Neuritis Hematologic/Lymphatic Respiratory Medical History: Positive for: Chronic Obstructive Pulmonary Disease (COPD); Sleep Apnea - no CPAP Past Medical History Notes: Hx lung cancer Cardiovascular Medical History: Positive for: Hypertension Past Medical History Notes: hypercholesteremia, 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 Immunological Neurologic Medical History: Positive for: Neuropathy Oncologic Medical History: Positive for: Received Chemotherapy; Received Radiation Past Medical History Notes: hx lung CA right and left lung, hx mouth CA HBO Extended History Items Eyes: Glaucoma Immunizations Pneumococcal Vaccine: Received Pneumococcal Vaccination: Yes Implantable Devices Yes Hospitalization / Surgery History Type of Hospitalization/Surgery left inguinal hernia repair lobectomy right and left lung mouth cancer surgery tonsilectomy Family and Social History Cancer: Yes - Mother; Diabetes: No; Heart Disease: Yes - Father,Siblings; Hereditary Spherocytosis: No; Hypertension: Yes - Mother,Father,Siblings; Kidney Disease: No; Lung Disease: No; Seizures: No; Stroke: No; Thyroid Problems: No; Tuberculosis: No; Former smoker - quit 2005; Marital Status - Married; Alcohol Use: Daily - bottle wine per day; Drug Use: No History; Caffeine Use: Daily - coffee; Financial Concerns: No; Food, Clothing or Shelter Needs: No; Support System Lacking: No; Transportation Concerns: No Electronic Signature(s) Signed: 06/13/2020 3:47:20 PM  By: Worthy Keeler PA-C Signed: 06/13/2020 5:45:26 PM By: Levan Hurst RN, BSN Entered By: Levan Hurst on 06/11/2020 15:18:32 -------------------------------------------------------------------------------- [place] Details Patient Name: Date of Service: [name] [name] 06/11/2020 Medical Record Number: [id number] Patient Account Number: [id number] Date of Birth/Sex: Treating RN: 1946-09-21 (74 y.o. 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 I10 Essential (primary) hypertension Facility Procedures CPT4 Code: 51025852 Description: [place] VISIT-LEV 3 EST PT Modifier: 25 Quantity: 1 CPT4 Code: 77824235 Description: [place] - DEB SUBQ TISSUE 20 SQ CM/< ICD-10 Diagnosis Description L97.512 Non-pressure chronic ulcer of other part of right foot with fat layer exposed Modifier: Quantity: 1 Physician Procedures : CPT4 Code Description Modifier [id number] - WC PHYS LEVEL 3 - EST PT 25 ICD-10 Diagnosis Description 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

## 2020-06-18 ENCOUNTER — Encounter (HOSPITAL_BASED_OUTPATIENT_CLINIC_OR_DEPARTMENT_OTHER): Payer: Medicare Other | Admitting: Physician Assistant

## 2020-06-18 DIAGNOSIS — L97512 Non-pressure chronic ulcer of other part of right foot with fat layer exposed: Secondary | ICD-10-CM | POA: Diagnosis not present

## 2020-06-18 NOTE — Progress Notes (Signed)
[name] ([id number]) Visit Report for 06/18/2020 Arrival Information Details Patient Name: Date of Service: [name] RD [name] 06/18/2020 2:30 PM Medical Record Number: [id number] Patient Account Number: [id number] Date of Birth/Sex: Treating RN: 10/05/1946 (74 y.o. [name] Primary Care [name]: Nicholes Stairs Other Clinician: Referring Daryus Sowash: Treating [name]/Extender: Dominga Ferry in Treatment: 1 Visit Information History Since Last Visit Added or deleted any medications: No Patient Arrived: Ambulatory Any new allergies or adverse reactions: No Arrival Time: 14:43 Had a fall or experienced change in No Accompanied By: wife activities of daily living that may affect Transfer Assistance: None risk of falls: Patient Identification Verified: Yes Signs or symptoms of abuse/neglect since last visito No Secondary Verification Process Completed: Yes Hospitalized since last visit: No Patient Requires Transmission-Based Precautions: No Implantable device outside of the clinic excluding No Patient Has Alerts: Yes cellular tissue based products placed in the center Patient Alerts: Patient on Blood Thinner since last visit: Has Dressing in Place as Prescribed: Yes Has Footwear/Offloading in Place as Prescribed: Yes Right: Other:front offloader Pain Present Now: No Electronic Signature(s) Signed: 06/18/2020 5:29:07 PM By: Kela Millin Entered By: Kela Millin on 06/18/2020 14:43:36 -------------------------------------------------------------------------------- Clinic Level of Care Assessment Details Patient Name: Date of Service: [name] RD C. 06/18/2020 2:30 PM Medical Record Number: [id number] Patient Account Number: [id number] Date of Birth/Sex: Treating RN: 1946/09/04 (74 y.o. [name] Primary Care Skyler Carel: Nicholes Stairs Other Clinician: Referring Any [name]: Treating [name]/Extender: Dominga Ferry in Treatment: 1 Clinic Level of Care Assessment Items TOOL 4 Quantity Score []  - 0 Use when only an EandM is performed on FOLLOW-UP visit ASSESSMENTS - Nursing Assessment / Reassessment X- 1 10 Reassessment of Co-morbidities (includes updates in patient status) X- 1 5 Reassessment of Adherence to Treatment Plan ASSESSMENTS - Wound and Skin A ssessment / Reassessment X - Simple Wound Assessment / Reassessment - one wound 1 5 []  - 0 Complex Wound Assessment / Reassessment - multiple wounds []  - 0 Dermatologic / Skin Assessment (not related to wound area) ASSESSMENTS - Focused Assessment []  - 0 Circumferential Edema Measurements - multi extremities []  - 0 Nutritional Assessment / Counseling / Intervention X- 1 5 Lower Extremity Assessment (monofilament, tuning fork, pulses) []  - 0 Peripheral Arterial Disease Assessment (using hand held doppler) ASSESSMENTS - Ostomy and/or Continence Assessment and Care []  - 0 Incontinence Assessment and Management []  - 0 Ostomy Care Assessment and Management (repouching, etc.) PROCESS - Coordination of Care []  - 0 Simple Patient / Family Education for ongoing care []  - 0 Complex (extensive) Patient / Family Education for ongoing care X- 1 10 Staff obtains Programmer, systems, Records, T Results / Process Orders est []  - 0 Staff telephones HHA, Nursing Homes / Clarify orders / etc []  - 0 Routine Transfer to another Facility (non-emergent condition) []  - 0 Routine Hospital Admission (non-emergent condition) []  - 0 New Admissions / Biomedical engineer / Ordering NPWT Apligraf, etc. , []  - 0 Emergency Hospital Admission (emergent condition) X- 1 10 Simple Discharge Coordination []  - 0 Complex (extensive) Discharge Coordination PROCESS - Special Needs []  - 0 Pediatric / Minor Patient Management []  - 0 Isolation Patient Management []  - 0 Hearing / Language / Visual special needs []  - 0 Assessment of Community  assistance (transportation, D/C planning, etc.) []  - 0 Additional assistance / Altered mentation []  - 0 Support Surface(s) Assessment (bed, cushion, seat, etc.) INTERVENTIONS - Wound Cleansing / Measurement  X - Simple Wound Cleansing - one wound 1 5 []  - 0 Complex Wound Cleansing - multiple wounds X- 1 5 Wound Imaging (photographs - any number of wounds) []  - 0 Wound Tracing (instead of photographs) X- 1 5 Simple Wound Measurement - one wound []  - 0 Complex Wound Measurement - multiple wounds INTERVENTIONS - Wound Dressings X - Small Wound Dressing one or multiple wounds 1 10 []  - 0 Medium Wound Dressing one or multiple wounds []  - 0 Large Wound Dressing one or multiple wounds X- 1 5 Application of Medications - topical []  - 0 Application of Medications - injection INTERVENTIONS - Miscellaneous []  - 0 External ear exam []  - 0 Specimen Collection (cultures, biopsies, blood, body fluids, etc.) []  - 0 Specimen(s) / Culture(s) sent or taken to Lab for analysis []  - 0 Patient Transfer (multiple staff / Civil Service fast streamer / Similar devices) []  - 0 Simple Staple / Suture removal (25 or less) []  - 0 Complex Staple / Suture removal (26 or more) []  - 0 Hypo / Hyperglycemic Management (close monitor of Blood Glucose) []  - 0 Ankle / Brachial Index (ABI) - do not check if billed separately X- 1 5 Vital Signs Has the patient been seen at the hospital within the last three years: Yes Total Score: 80 Level Of Care: New/Established - Level 3 Electronic Signature(s) Signed: 06/18/2020 5:49:12 PM By: Baruch Gouty RN, BSN Entered By: Baruch Gouty on 06/18/2020 15:32:00 -------------------------------------------------------------------------------- Lower Extremity Assessment Details Patient Name: Date of Service: [name] RD C. 06/18/2020 2:30 PM Medical Record Number: [id number] Patient Account Number: [id number] Date of Birth/Sex: Treating RN: 25-Feb-1946 (74 y.o. [name] Primary Care Anival Pasha: Nicholes Stairs Other Clinician: Referring Emiya Loomer: Treating Zynasia Burklow/Extender: Dominga F[place] in Treatment: 1 Edema Assessment Assessed: [Left: No] [Right: No] Edema: [Left: N] [Right: o] Calf Left: Right: Point of Measurement: cm From Medial Instep cm 41 cm Ankle Left: Right: Point of Measurement: cm From Medial Instep cm 23 cm Vascular Assessment Pulses: Dorsalis Pedis Palpable: [Right:Yes] Electronic Signature(s) Signed: 06/18/2020 5:29:07 PM By: Kela Millin Entered By: Kela Millin on 06/18/2020 14:44:25 -------------------------------------------------------------------------------- Multi-Disciplinary Care Plan Details Patient Name: Date of Service: [name] RD C. 06/18/2020 2:30 PM Medical Record Number: [id number] Patient Account Number: [id number] Date of Birth/Sex: Treating RN: 1946/07/30 (74 y.o. [name] Primary Care Guneet Delpino: Nicholes Stairs Other Clinician: Referring Angelica Frandsen: Treating Damante Spragg/Extender: Dominga [place] in Treatment: 1 Active Inactive Peripheral Neuropathy Nursing Diagnoses: Knowledge deficit related to disease process and management of peripheral neurovascular dysfunction Potential alteration in peripheral tissue perfusion (select prior to confirmation of diagnosis) Goals: Patient/caregiver will verbalize understanding of disease process and disease management Date Initiated: 06/11/2020 Target Resolution Date: 07/09/2020 Goal Status: Active Interventions: Assess signs and symptoms of neuropathy upon admission and as needed Provide education on Management of Neuropathy and Related Ulcers Provide education on Management of Neuropathy upon discharge from the [facility] Notes: Wound/Skin Impairment Nursing Diagnoses: Impaired tissue integrity Knowledge deficit related to ulceration/compromised skin integrity Goals: Patient/caregiver will  verbalize understanding of skin care regimen Date Initiated: 06/11/2020 Target Resolution Date: 07/09/2020 Goal Status: Active Ulcer/skin breakdown will have a volume reduction of 30% by week 4 Date Initiated: 06/11/2020 Target Resolution Date: 07/09/2020 Goal Status: Active Interventions: Assess patient/caregiver ability to obtain necessary supplies Assess patient/caregiver ability to perform ulcer/skin care regimen upon admission and as needed Assess ulceration(s) every visit Provide education on ulcer and skin care  Treatment Activities: Skin care regimen initiated : 06/11/2020 Topical wound management initiated : 06/11/2020 Notes: Electronic Signature(s) Signed: 06/18/2020 5:49:12 PM By: Baruch Gouty RN, BSN Entered By: Baruch Gouty on 06/18/2020 15:08:24 -------------------------------------------------------------------------------- Pain Assessment Details Patient Name: Date of Service: [name] RD C. 06/18/2020 2:30 PM Medical Record Number: [id number] Patient Account Number: [id number] Date of Birth/Sex: Treating RN: 1946/02/17 (74 y.o. [name] Primary Care Thorne Wirz: Nicholes Stairs Other Clinician: Referring Keymora Grillot: Treating Nikiah Goin/Extender: Dominga Ferry in Treatment: 1 Active Problems Location of Pain Severity and Description of Pain Patient Has Paino No Site Locations Pain Management and Medication Current Pain Management: Electronic Signature(s) Signed: 06/18/2020 5:29:07 PM By: Kela Millin Entered By: Kela Millin on 06/18/2020 14:44:12 -------------------------------------------------------------------------------- Patient/Caregiver Education Details Patient Name: Date of Service: [name] RD [name] 6/30/2021andnbsp2:30 PM Medical Record Number: [id number] Patient Account Number: [id number] Date of Birth/Gender: Treating RN: Mar 08, 1946 (74 y.o. [name] Primary Care Physician: Nicholes Stairs Other Clinician: Referring Physician: Treating Physician/Extender: Dominga Ferry in Treatment: 1 Education Assessment Education Provided To: Patient Education Topics Provided Offloading: Methods: Explain/Verbal Responses: Reinforcements needed, State content correctly Wound/Skin Impairment: Methods: Explain/Verbal Responses: Reinforcements needed, State content correctly Electronic Signature(s) Signed: 06/18/2020 5:49:12 PM By: Baruch Gouty RN, BSN Entered By: Baruch Gouty on 06/18/2020 15:09:05 -------------------------------------------------------------------------------- Wound Assessment Details Patient Name: Date of Service: [name] RD C. 06/18/2020 2:30 PM Medical Record Number: [id number] Patient Account Number: [id number] Date of Birth/Sex: Treating RN: [birth year] (74 y.o. [name] Primary Care Mattye Verdone: Nicholes Stairs Other Clinician: Referring Amante Fomby: Treating Cornell Gaber/Extender: Doming[place] in Treatment: 1 Wound Status Wound Number: 2 Primary Neuropathic Ulcer-Non Diabetic Etiology: Wound Location: Right Metatarsal head fifth Wound Open Wounding Event: Gradually Appeared Status: Date Acquired: 06/07/2020 Comorbid Glaucoma, Chronic Obstructive Pulmonary Disease (COPD), Sleep Weeks Of Treatment: 1 History: Apnea, Hypertension, Neuropathy, Received Chemotherapy, Clustered Wound: No Received Radiation Wound Measurements Length: (cm) 0.4 Width: (cm) 0.6 Depth: (cm) 0.1 Area: (cm) 0.188 Volume: (cm) 0.019 % Reduction in Area: 68.9% % Reduction in Volume: 68.3% Epithelialization: None Tunneling: No Undermining: No Wound Description Classification: Full Thickness Without Exposed Support Structures Wound Margin: Flat and Intact Exudate Amount: Medium Exudate Type: Serosanguineous Exudate Color: red, brown Foul Odor After Cleansing: No Slough/Fibrino No Wound Bed Granulation  Amount: Large (67-100%) Exposed Structure Granulation Quality: Pink Fascia Exposed: No Necrotic Amount: None Present (0%) Fat Layer (Subcutaneous Tissue) Exposed: Yes Tendon Exposed: No Muscle Exposed: No Joint Exposed: No Bone Exposed: No Electronic Signature(s) Signed: 06/18/2020 5:29:07 PM By: Kela Millin Entered By: Kela Millin on 06/18/2020 14:44:47 -------------------------------------------------------------------------------- Vitals Details Patient Name: Date of Service: [name] RD C. 06/18/2020 2:30 PM Medical Record Number: [id number] Patient Account Number: [id number] Date of Birth/Sex: Treating RN: [birth year] (74 y.o. [name] Primary Care Liberato Stansbery: Nicholes Stairs Other Clinician: Referring Dawn Convery: Treating Akshar Starnes/Extender: Doming[place] in Treatment: 1 Vital Signs Time Taken: 14:40 Temperature (F): 97.9 Height (in): 70 Pulse (bpm): 91 Weight (lbs): 214 Respiratory Rate (breaths/min): 18 Body Mass Index (BMI): 30.7 Blood Pressure (mmHg): 133/79 Reference Range: 80 - 120 mg / dl Electronic Signature(s) Signed: 06/18/2020 5:29:07 PM By: Kela Millin Entered By: Kela Millin on 06/18/2020 14:44:05

## 2020-06-18 NOTE — Progress Notes (Addendum)
[name] ([id number]) Visit Report for 06/18/2020 Chief Complaint Document Details Patient Name: Date of Service: [name] [name] 06/18/2020 2:30 PM Medical Record Number: [id number] Patient Account Number: [id number] Date of Birth/Sex: Treating RN: 13-Jun-1946 (74 y.o. [name] Primary Care Provider: Nicholes Stairs Other Clinician: Referring Provider: Treating Provider/Extender: Dominga Ferry in Treatment: 1 Information Obtained from: Patient Chief Complaint Wound exam; patient is here for review of the wound on the right fifth met metatarsal head Electronic Signature(s) Signed: 06/18/2020 3:23:47 PM By: Worthy Keeler PA-C Entered By: Worthy Keeler on 06/18/2020 15:23:46 -------------------------------------------------------------------------------- HPI Details Patient Name: Date of Service: [name] [name] 06/18/2020 2:30 PM Medical Record Number: [id number] Patient Account Number: [id number] Date of Birth/Sex: Treating RN: Jul 11, 1946 (74 y.o. [name] Primary Care Provider: Nicholes Stairs Other Clinician: Referring Provider: Treating Provider/Extender: Dominga Ferry in Treatment: 1 History of Present Illness HPI Description: ADMISSION 03/28/2020 This is a 74 year old retired man who has a history of neuropathy nondiabetic apparently felt to be secondary to chemotherapy he received this part of treatment for adeno CA of the lung many years ago. Apparently this started about a month ago. He was seen in the walk-in clinic and had cellulitis. Noted to have a draining wound on the plantar aspect of his right foot. He was given Rocephin and treated with doxycycline. And follow-up with primary care culture showed MRSA also resistant to quinolones. He was given doxycycline and clindamycin. When he was seen in follow-up on March 22 his doxycycline was refilled. The wound looked better from an infection point  of view. He has finished his doxycycline. He has been using Bactroban to the wound not offloading this at all. He walks around in bare feet in the house. 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 noncompressible and 1.33 on the left also noncompressible 4/16; patient with a nondiabetic idiopathic peripheral neuropathy. Neuropathic wound presented to the clinic last week on the plantar right fifth met head. This became secondarily infected treated by his primary physician and walk-in clinics. He has not shown any evidence of infection. We gave him a forefoot offloading boot. Fortunately he was able to manage to work with this. He arrives in the clinic today with this wound almost healed. We were using silver collagen 4/23; wound on the plantar aspect of the right fifth metatarsal head. I thought this might be close this week however it is not. We have been using silver alginate 4/30; the wound on the plantar aspect of the fifth metatarsal head in a man with a idiopathic nondiabetic peripheral neuropathy. The area is closed this week. He still has a lot of callus around this wound indicative of pressure/friction when he walks. Readmission: 06/11/2020 upon evaluation today patient presents for reevaluation here in our clinic concerning a reopening of the wound on the lateral portion of his foot which is in the same location where we previously took care of him back in April of this year. Fortunately there does not appear to be any signs of active infection at this time it does appear that he developed a blister that unfortunately opened over the weekend after they called in concerned  about this wanting to get in sooner rather than later. With  that being said he did see his primary care provider this past Monday that had a culture that has not come back yet. With that being said there does not appear to be any signs of active infection at this time and overall I feel like that is really not a significant issue currently. With regard to the antibiotics his primary care provider did not give him any antibiotics as they did not feel it was infected either. 06/18/2020 upon evaluation today patient appears to be doing fairly well in regard to his plantar foot ulcer. I am actually very pleased with where things stand and overall feeling the patient is managing quite nicely. With that being said I do not see any signs of active infection at this time. No fevers, chills, nausea, vomiting, or diarrhea. Electronic Signature(s) Signed: 06/18/2020 5:31:39 PM By: Worthy Keeler PA-C Entered By: Worthy Keeler on 06/18/2020 17:31:39 -------------------------------------------------------------------------------- Physical Exam Details Patient Name: Date of Service: [name] 06/18/2020 2:30 PM Medical Record Number: [id number] Patient Account Number: [id number] Date of Birth/Sex: Treating RN: [birth year] (74 y.o. [name] Primary Care Provider: Nicholes Stairs Other Clinician: Referring Provider: Treating Provider/Extender: Dominga Ferry in Treatment: 1 Constitutional Well-nourished and well-hydrated in no acute distress. Respiratory normal breathing without difficulty. Psychiatric this patient is able to make decisions and demonstrates good insight into disease process. Alert and Oriented x 3. pleasant and cooperative. Notes With regard to the wound currently patient actually showed signs of good epithelization this may not be completely healed but is very close and seems to be doing quite well in general. Overall very pleased with the progress up to this point. Electronic Signature(s) Signed:  06/18/2020 5:31:55 PM By: Worthy Keeler PA-C Entered By: Worthy Keeler on 06/18/2020 17:31:55 -------------------------------------------------------------------------------- Physician Orders Details Patient Name: Date of Service: [name] [name] 06/18/2020 2:30 PM Medical Record Number: [id number] Patient Account Number: [id number] Date of Birth/Sex: Treating RN: Apr 22, 1946 (74 y.o. [name] Primary Care Provider: Nicholes Stairs Other Clinician: Referring Provider: Treating Provider/Extender: Dominga Ferry in Treatment: 1 Verbal / Phone Orders: No Diagnosis Coding ICD-10 Coding Code Description G90.09 Other idiopathic peripheral autonomic neuropathy L97.512 Non-pressure chronic ulcer of other part of right foot with fat layer exposed I10 Essential (primary) hypertension Follow-up Appointments Return Appointment in 2 weeks. Dressing Change Frequency Wound #2 Right Metatarsal head fifth Change Dressing every other day. Wound Cleansing Wound #2 Right Metatarsal head fifth May shower and wash wound with soap and water. Primary Wound Dressing Wound #2 Right Metatarsal head fifth Calcium Alginate with Silver Secondary Dressing Wound #2 Right Metatarsal head fifth Kerlix/Rolled Gauze Dry Gauze Off-Loading Wedge shoe to: - right foot Electronic Signature(s) Signed: 06/18/2020 5:49:12 PM By: Baruch Gouty RN, BSN Signed: 06/18/2020 5:59:30 PM By: Worthy Keeler PA-C Entered By: Baruch Gouty on 06/18/2020 15:31:13 -------------------------------------------------------------------------------- Problem List Details Patient Name: Date of Service: [name] [name] 06/18/2020 2:30 PM Medical Record Number: [id number] Patient Account Number: [id number] Date of Birth/Sex: Treating RN: 09/01/1946 (74 y.o. [name] Primary Care Provider: Nicholes Stairs Other Clinician: Referring Provider: Treating Provider/Extender: Dominga Ferry in Treatment: 1 Active Problems ICD-10 Encounter Code Description Active Date MDM Diagnosis G90.09 Other idiopathic peripheral autonomic neuropathy 06/11/2020 No Yes L97.512 Non-pressure chronic ulcer of other part of right foot  with fat layer exposed 06/11/2020 No Yes I10 Essential (primary) hypertension 06/11/2020 No Yes Inactive Problems Resolved Problems Electronic Signature(s) Signed: 06/18/2020 3:23:40 PM By: Worthy Keeler PA-C Entered By: Worthy Keeler on 06/18/2020 15:23:40 -------------------------------------------------------------------------------- Progress Note Details Patient Name: Date of Service: [name] [name] 06/18/2020 2:30 PM Medical Record Number: [id number] Patient Account Number: [id number] Date of Birth/Sex: Treating RN: [birth year] (74 y.o. [name] Primary Care Provider: Nicholes Stairs Other Clinician: Referring Provider: Treating Provider/Extender: Dominga Ferry in Treatment: 1 Subjective Chief Complaint Information obtained from Patient Wound exam; patient is here for review of the wound on the right fifth met metatarsal head History of Present Illness (HPI) ADMISSION 03/28/2020 This is a 74 year old retired man who has a history of neuropathy nondiabetic apparently felt to be secondary to chemotherapy he received this part of treatment for adeno CA of the lung many years ago. Apparently this started about a month ago. He was seen in the walk-in clinic and had cellulitis. Noted to have a draining wound on the plantar aspect of his right foot. He was given Rocephin and treated with doxycycline. And follow-up with primary care culture showed MRSA also resistant to quinolones. He was given doxycycline and clindamycin. When he was seen in follow-up on March 22 his doxycycline was refilled. The wound looked better from an infection point of view. He has finished his doxycycline. He has been using  Bactroban to the wound not offloading this at all. He walks around in bare feet in the house. 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 noncompressible and 1.33 on the left also noncompressible 4/16; patient with a nondiabetic idiopathic peripheral neuropathy. Neuropathic wound presented to the clinic last week on the plantar right fifth met head. This became secondarily infected treated by his primary physician and walk-in clinics. He has not shown any evidence of infection. We gave him a forefoot offloading boot. Fortunately he was able to manage to work with this. He arrives in the clinic today with this wound almost healed. We were using silver collagen 4/23; wound on the plantar aspect of the right fifth metatarsal head. I thought this might be close this week however it is not. We have been using silver alginate 4/30; the wound on the plantar aspect of the fifth metatarsal head in a man with a idiopathic nondiabetic peripheral neuropathy. The area is closed this week. He still has a lot of callus around this wound indicative of pressure/friction when he walks. Readmission: 06/11/2020 upon evaluation today patient presents for reevaluation here in our clinic concerning a reopening of the wound on the lateral portion of his foot which is in the same location where we previously took care of him back in April of this year. Fortunately there does not appear to be any signs of active infection at this time it does appear that he developed a blister that unfortunately opened over the weekend after they called in concerned about this wanting to get in sooner rather than later. With that being said he did see his primary care provider this past  Monday that had a culture that has not come back yet. With that being said there does not appear to be any  signs of active infection at this time and overall I feel like that is really not a significant issue currently. With regard to the antibiotics his primary care provider did not give him any antibiotics as they did not feel it was infected either. 06/18/2020 upon evaluation today patient appears to be doing fairly well in regard to his plantar foot ulcer. I am actually very pleased with where things stand and overall feeling the patient is managing quite nicely. With that being said I do not see any signs of active infection at this time. No fevers, chills, nausea, vomiting, or diarrhea. Objective Constitutional Well-nourished and well-hydrated in no acute distress. Vitals Time Taken: 2:40 PM, Height: 70 in, Weight: 214 lbs, BMI: 30.7, Temperature: 97.9 F, Pulse: 91 bpm, Respiratory Rate: 18 breaths/min, Blood Pressure: 133/79 mmHg. Respiratory normal breathing without difficulty. Psychiatric this patient is able to make decisions and demonstrates good insight into disease process. Alert and Oriented x 3. pleasant and cooperative. General Notes: With regard to the wound currently patient actually showed signs of good epithelization this may not be completely healed but is very close and seems to be doing quite well in general. Overall very pleased with the progress up to this point. Integumentary (Hair, Skin) Wound #2 status is Open. Original cause of wound was Gradually Appeared. The wound is located on the Right Metatarsal head fifth. The wound measures 0.4cm length x 0.6cm width x 0.1cm depth; 0.188cm^2 area and 0.019cm^3 volume. There is Fat Layer (Subcutaneous Tissue) Exposed exposed. There is no tunneling or undermining noted. There is a medium amount of serosanguineous drainage noted. The wound margin is flat and intact. There is large (67-100%) pink granulation within the wound  bed. There is no necrotic tissue within the wound bed. Assessment Active Problems ICD-10 Other idiopathic peripheral autonomic neuropathy Non-pressure chronic ulcer of other part of right foot with fat layer exposed Essential (primary) hypertension Plan Follow-up Appointments: Return Appointment in 2 weeks. Dressing Change Frequency: Wound #2 Right Metatarsal head fifth: Change Dressing every other day. Wound Cleansing: Wound #2 Right Metatarsal head fifth: May shower and wash wound with soap and water. Primary Wound Dressing: Wound #2 Right Metatarsal head fifth: Calcium Alginate with Silver Secondary Dressing: Wound #2 Right Metatarsal head fifth: Kerlix/Rolled Gauze Dry Gauze Off-Loading: Wedge shoe to: - right foot 1. Recommend currently that we actually continue to monitor this for the next couple weeks like to see him for 2-week check. If he is healed at that time then we will discontinue wound care services then obviously. In the meantime if he heals prior and wants to try to get into the pool I think that can be okay he just needs to use a next care type bandage in order to prevent this from getting a lot of moisture right to the area in question. 2. With regard to the actual dressing currently we can use a silver alginate dressing to keep this nice and dry. We will see patient back for reevaluation in 2 weeks here in the clinic. If anything worsens or changes patient will contact our office for additional recommendations. Electronic Signature(s) Signed: 06/18/2020 5:32:43 PM By: Worthy Keeler PA-C Entered By: Worthy Keeler on 06/18/2020 17:32:43 -------------------------------------------------------------------------------- SuperBill Details Patient Name: Date of Service: [name] [name] 06/18/2020 Medical Record Number: [id number] Patient Account Number: [id number] Date of Birth/Sex: Treating RN: 04-21-1946 (74 y.o. [name] Primary Care Provider:  Nicholes Stairs Other Clinician: Referring Provider: Treating Provider/Extender: Melburn Hake,  Tudor Chandley Little, Priscille Heidelberg Weeks in Treatment: 1 Diagnosis Coding ICD-10 Codes Code Description G90.09 Other idiopathic peripheral autonomic neuropathy L97.512 Non-pressure chronic ulcer of other part of right foot with fat layer exposed I10 Essential (primary) hypertension Facility Procedures CPT4 Code: [id number] Description: 99213 - WOUND CARE VISIT-LEV 3 EST PT Modifier: Quantity: 1 Physician Procedures : CPT4 Code Description Modifier [id number] - WC PHYS LEVEL 3 - EST PT ICD-10 Diagnosis Description G90.09 Other idiopathic peripheral autonomic neuropathy L97.512 Non-pressure chronic ulcer of other part of right foot with fat layer exposed I10  Essential (primary) hypertension Quantity: 1 Electronic Signature(s) Signed: 06/18/2020 5:33:00 PM By: Worthy Keeler PA-C Entered By: Worthy Keeler on 06/18/2020 17:33:00

## 2020-07-01 ENCOUNTER — Other Ambulatory Visit: Payer: Self-pay

## 2020-07-01 ENCOUNTER — Encounter (HOSPITAL_BASED_OUTPATIENT_CLINIC_OR_DEPARTMENT_OTHER): Payer: Medicare Other | Attending: Internal Medicine | Admitting: Internal Medicine

## 2020-07-01 DIAGNOSIS — Z7901 Long term (current) use of anticoagulants: Secondary | ICD-10-CM | POA: Insufficient documentation

## 2020-07-01 DIAGNOSIS — Z86718 Personal history of other venous thrombosis and embolism: Secondary | ICD-10-CM | POA: Insufficient documentation

## 2020-07-01 DIAGNOSIS — L97512 Non-pressure chronic ulcer of other part of right foot with fat layer exposed: Secondary | ICD-10-CM | POA: Diagnosis present

## 2020-07-01 DIAGNOSIS — Z85118 Personal history of other malignant neoplasm of bronchus and lung: Secondary | ICD-10-CM | POA: Insufficient documentation

## 2020-07-01 DIAGNOSIS — Z9221 Personal history of antineoplastic chemotherapy: Secondary | ICD-10-CM | POA: Insufficient documentation

## 2020-07-01 DIAGNOSIS — G9009 Other idiopathic peripheral autonomic neuropathy: Secondary | ICD-10-CM | POA: Diagnosis not present

## 2020-07-01 DIAGNOSIS — I10 Essential (primary) hypertension: Secondary | ICD-10-CM | POA: Insufficient documentation

## 2020-07-01 NOTE — Progress Notes (Addendum)
MARQUIZ, SOTELO ([id number]) Visit Report for 07/01/2020 HPI Details Patient Name: Date of Service: HARPREET, POMPEY RD C. 07/01/2020 12:30 PM Medical Record Number: [id number] Patient Account Number: [id number] Date of Birth/Sex: Treating RN: February 03, 1946 (74 y.o. Jerilynn Mages) Carlene Coria Primary Care Provider: Nicholes Stairs Other Clinician: Referring Provider: Treating Provider/Extender: Nyra Jabs in Treatment: 2 History of Present Illness HPI Description: ADMISSION 03/28/2020 This is a 74 year old retired man who has a history of neuropathy nondiabetic apparently felt to be secondary to chemotherapy he received this part of treatment for adeno CA of the lung many years ago. Apparently this started about a month ago. He was seen in the walk-in clinic and had cellulitis. Noted to have a draining wound on the plantar aspect of his right foot. He was given Rocephin and treated with doxycycline. And follow-up with primary care culture showed MRSA also resistant to quinolones. He was given doxycycline and clindamycin. When he was seen in follow-up on March 22 his doxycycline was refilled. The wound looked better from an infection point of view. He has finished his doxycycline. He has been using Bactroban to the wound not offloading this at all. He walks around in bare feet in the house. 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 noncompressible and 1.33 on the left also noncompressible 4/16; patient with a nondiabetic idiopathic peripheral neuropathy. Neuropathic wound presented to the clinic last week on the plantar right fifth met head. This became secondarily infected treated by his primary physician and walk-in clinics. He  has not shown any evidence of infection. We gave him a forefoot offloading boot. Fortunately he was able to manage to work with this. He arrives in the clinic today with this wound almost healed. We were using silver collagen 4/23; wound on the plantar aspect of the right fifth metatarsal head. I thought this might be close this week however it is not. We have been using silver alginate 4/30; the wound on the plantar aspect of the fifth metatarsal head in a man with a idiopathic nondiabetic peripheral neuropathy. The area is closed this week. He still has a lot of callus around this wound indicative of pressure/friction when he walks. Readmission: 06/11/2020 upon evaluation today patient presents for reevaluation here in our clinic concerning a reopening of the wound on the lateral portion of his foot which is in the same location where we previously took care of him back in April of this year. Fortunately there does not appear to be any signs of active infection at this time it does appear that he developed a blister that unfortunately opened over the weekend after they called in concerned about this wanting to get in sooner rather than later. With that being said he did see his primary care provider this past Monday that had a culture that has not come back yet. With that being said there does not appear to be any signs of active infection at this time and overall I feel like that is really not a significant issue currently. With regard to the antibiotics his primary care provider did not give him any antibiotics as they did not feel it was infected either. 06/18/2020 upon evaluation today patient appears to be doing fairly well in  regard to his plantar foot ulcer. I am actually very pleased with where things stand and overall feeling the patient is managing quite nicely. With that being said I do not see any signs of active infection at this time. No fevers, chills, nausea, vomiting, or  diarrhea. 7/13; right plantar foot ulcer of the fifth metatarsal head. Once again this is closed. Slight amount of callus but there is nothing open nothing looks ominous. Slight subluxation of the fifth metatarsal head at about a distance equal to the first. They have an appointment with podiatry next Monday. Electronic Signature(s) Signed: 07/01/2020 5:16:39 PM By: Linton Ham MD Entered By: Linton Ham on 07/01/2020 13:48:52 -------------------------------------------------------------------------------- Physical Exam Details Patient Name: Date of Service: [name] 07/01/2020 12:30 PM Medical Record Number: [id number] Patient Account Number: [id number] Date of Birth/Sex: Treating RN: 1946-06-17 (74 y.o. Oval Linsey Primary Care Provider: Nicholes Stairs Other Clinician: Referring Provider: Treating Provider/Extender: Nyra Jabs in Treatment: 2 Constitutional Sitting or standing Blood Pressure is within target range for patient.. Pulse regular and within target range for patient.Marland Kitchen Respirations regular, non-labored and within target range.. Temperature is normal and within the target range for the patient.Marland Kitchen Appears in no distress. Notes Wound exam; the wound currently is actually completely closed. Slight amount of surface callus no debridement was necessary no evidence of infection Electronic Signature(s) Signed: 07/01/2020 5:16:39 PM By: Linton Ham MD Entered By: Linton Ham on 07/01/2020 13:49:24 -------------------------------------------------------------------------------- Physician Orders Details Patient Name: Date of Service: [name] 07/01/2020 12:30 PM Medical Record Number: [id number] Patient Account Number: [id number] Date of Birth/Sex: Treating RN: 1946-05-23 (74 y.o. Oval Linsey Primary Care Provider: Nicholes Stairs Other Clinician: Referring Provider: Treating Provider/Extender: Nyra Jabs in Treatment: 2 Verbal / Phone Orders: No Diagnosis Coding ICD-10 Coding Code Description G90.09 Other idiopathic peripheral autonomic neuropathy L97.512 Non-pressure chronic ulcer of other part of right foot with fat layer exposed I10 Essential (primary) hypertension Discharge From [facility] Discharge from [place] - pad foot, wear socks with shoes with insole , at all times Electronic Signature(s) Signed: 07/01/2020 5:16:39 PM By: Linton Ham MD Signed: 07/01/2020 5:57:23 PM By: Carlene Coria RN Entered By: Carlene Coria on 07/01/2020 13:45:45 -------------------------------------------------------------------------------- Problem List Details Patient Name: Date of Service: [name] 07/01/2020 12:30 PM Medical Record Number: [id number] Patient Account Number: [id number] Date of Birth/Sex: Treating RN: [birth year] (74 y.o. Oval Linsey Primary Care Provider: Nicholes Stairs Other Clinician: Referring Provider: Treating Provider/Extender: Nyra Jabs in Treatment: 2 Active Problems ICD-10 Encounter Code Description Active Date MDM Diagnosis G90.09 Other idiopathic peripheral autonomic neuropathy 06/11/2020 No Yes L97.512 Non-pressure chronic ulcer of other part of right foot with fat layer exposed 06/11/2020 No Yes I10 Essential (primary) hypertension 06/11/2020 No Yes Inactive Problems Resolved Problems Electronic Signature(s) Signed: 07/01/2020 5:16:39 PM By: Linton Ham MD Entered By: Linton Ham on 07/01/2020 13:47:57 -------------------------------------------------------------------------------- Progress Note Details Patient Name: Date of Service: [name] 07/01/2020 12:30 PM Medical Record Number: [id number] Patient Account Number: [id number] Date of Birth/Sex: Treating RN: [birth year] (74 y.o. Oval Linsey Primary Care Provider: Nicholes Stairs Other Clinician: Referring Provider: Treating  Provider/Extender: Nyra Jabs in Treatment: 2 Subjective History of Present Illness (HPI) ADMISSION 03/28/2020 This is a 74 year old retired man who has a history of neuropathy nondiabetic apparently felt to be secondary to chemotherapy he received this  part of treatment for adeno CA of the lung many years ago. Apparently this started about a month ago. He was seen in the walk-in clinic and had cellulitis. Noted to have a draining wound on the plantar aspect of his right foot. He was given Rocephin and treated with doxycycline. And follow-up with primary care culture showed MRSA also resistant to quinolones. He was given doxycycline and clindamycin. When he was seen in follow-up on March 22 his doxycycline was refilled. The wound looked better from an infection point of view. He has finished his doxycycline. He has been using Bactroban to the wound not offloading this at all. He walks around in bare feet in the house. 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 noncompressible and 1.33 on the left also noncompressible 4/16; patient with a nondiabetic idiopathic peripheral neuropathy. Neuropathic wound presented to the clinic last week on the plantar right fifth met head. This became secondarily infected treated by his primary physician and walk-in clinics. He has not shown any evidence of infection. We gave him a forefoot offloading boot. Fortunately he was able to manage to work with this. He arrives in the clinic today with this wound almost healed. We were using silver collagen 4/23; wound on the plantar aspect of the right fifth metatarsal head. I thought this might be close this week however it is not. We have  been using silver alginate 4/30; the wound on the plantar aspect of the fifth metatarsal head in a man with a idiopathic nondiabetic peripheral neuropathy. The area is closed this week. He still has a lot of callus around this wound indicative of pressure/friction when he walks. Readmission: 06/11/2020 upon evaluation today patient presents for reevaluation here in our clinic concerning a reopening of the wound on the lateral portion of his foot which is in the same location where we previously took care of him back in April of this year. Fortunately there does not appear to be any signs of active infection at this time it does appear that he developed a blister that unfortunately opened over the weekend after they called in concerned about this wanting to get in sooner rather than later. With that being said he did see his primary care provider this past Monday that had a culture that has not come back yet. With that being said there does not appear to be any signs of active infection at this time and overall I feel like that is really not a significant issue currently. With regard to the antibiotics his primary care provider did not give him any antibiotics as they did not feel it was infected either. 06/18/2020 upon evaluation today patient appears to be doing fairly well in regard to his plantar foot ulcer. I am actually very pleased with where things stand and overall feeling the patient is managing quite nicely. With that being said I do not see any signs of active infection at this time. No fevers, chills, nausea, vomiting, or diarrhea. 7/13; right plantar foot ulcer of the fifth metatarsal head. Once again this is closed. Slight amount of callus but there is nothing open nothing looks ominous. Slight subluxation of the fifth metatarsal head at about a distance equal to the first. They have an appointment  with podiatry next Monday. Objective Constitutional Sitting or standing Blood Pressure is  within target range for patient.. Pulse regular and within target range for patient.Marland Kitchen Respirations regular, non-labored and within target range.. Temperature is normal and within the target range for the patient.Marland Kitchen Appears in no distress. Vitals Time Taken: 12:51 PM, Height: 70 in, Weight: 214 lbs, BMI: 30.7, Temperature: 98.1 F, Pulse: 85 bpm, Respiratory Rate: 18 breaths/min, Blood Pressure: 128/80 mmHg. General Notes: Wound exam; the wound currently is actually completely closed. Slight amount of surface callus no debridement was necessary no evidence of infection Integumentary (Hair, Skin) Wound #2 status is Healed - Epithelialized. Original cause of wound was Gradually Appeared. The wound is located on the Right Metatarsal head fifth. The wound measures 0cm length x 0cm width x 0cm depth; 0cm^2 area and 0cm^3 volume. There is Fat Layer (Subcutaneous Tissue) Exposed exposed. There is no tunneling or undermining noted. There is a none present amount of drainage noted. There is no granulation within the wound bed. There is no necrotic tissue within the wound bed. Assessment Active Problems ICD-10 Other idiopathic peripheral autonomic neuropathy Non-pressure chronic ulcer of other part of right foot with fat layer exposed Essential (primary) hypertension Plan Discharge From [facility] Services: Discharge from [facility] - pad foot, wear socks with shoes with insole , at all times 1. The patient can be discharged from the wound care center 2. I have recommended foam pad over the area of socks shoes with insoles. 3. Sees podiatry next Monday we will see if they have any other options. This is now become a recurrent wound. It closes fairly easily with a standard dressing and forefoot off loader however it did recur very quickly albeit at a time where he is wearing swimming shoes at a swimming pool Electronic Signature(s) Signed: 07/01/2020 5:16:39 PM By: Linton Ham MD Entered By: Linton Ham on 07/01/2020 13:50:21 -------------------------------------------------------------------------------- SuperBill Details Patient Name: Date of Service: [name] 07/01/2020 Medical Record Number: [id number] Patient Account Number: [id number] Date of Birth/Sex: Treating RN: 02-11-1946 (74 y.o. Oval Linsey Primary Care Provider: Nicholes Stairs Other Clinician: Referring Provider: Treating Provider/Extender: Nyra Jabs in Treatment: 2 Diagnosis Coding ICD-10 Codes Code Description G90.09 Other idiopathic peripheral autonomic neuropathy L97.512 Non-pressure chronic ulcer of other part of right foot with fat layer exposed I10 Essential (primary) hypertension Facility Procedures CPT4 Code: [id number] Description: [phone] - WOUND CARE VISIT-LEV 2 EST PT Modifier: Quantity: 1 Physician Procedures : CPT4 Code Description Modifier [id number] - WC PHYS LEVEL 3 - EST PT ICD-10 Diagnosis Description G90.09 Other idiopathic peripheral autonomic neuropathy L97.512 Non-pressure chronic ulcer of other part of right foot with fat layer exposed Quantity: 1 Electronic Signature(s) Signed: 07/01/2020 5:16:39 PM By: Linton Ham MD Entered By: Linton Ham on 07/01/2020 13:50:43

## 2020-07-03 NOTE — Progress Notes (Signed)
[name], [name] ([id number]) Visit Report for 07/01/2020 Arrival Information Details Patient Name: Date of Service: [name], [name] Caleb C. 07/01/2020 12:30 PM Medical Record Number: [id number] Patient Account Number: [id number] Date of Birth/Sex: Treating RN: [birth year] (74 y.o. Jerilynn Mages) Carlene Coria Primary Care Anadalay Macdonell: Nicholes Stairs Other Clinician: Referring Deannie Resetar: Treating Tara Wich/Extender: Nyra Jabs in Treatment: 2 Visit Information History Since Last Visit Added or deleted any medications: No Patient Arrived: Ambulatory Any new allergies or adverse reactions: No Arrival Time: 12:51 Had a fall or experienced change in No Accompanied By: wife activities of daily living that may affect Transfer Assistance: None risk of falls: Patient Identification Verified: Yes Signs or symptoms of abuse/neglect since last visito No Secondary Verification Process Completed: Yes Hospitalized since last visit: No Patient Requires Transmission-Based Precautions: No Implantable device outside of the clinic excluding No Patient Has Alerts: Yes cellular tissue based products placed in the center Patient Alerts: Patient on Blood Thinner since last visit: Has Dressing in Place as Prescribed: Yes Pain Present Now: No Electronic Signature(s) Signed: 07/03/2020 10:46:54 AM By: Sandre Kitty Entered By: Sandre Kitty on 07/01/2020 12:51:23 -------------------------------------------------------------------------------- Clinic Level of Care Assessment Details Patient Name: Date of Service: [name], Caleb Caleb C. 07/01/2020 12:30 PM Medical Record Number: [id number] Patient Account Number: [id number] Date of Birth/Sex: Treating RN: [birth year] (74 y.o. Jerilynn Mages) Carlene Coria Primary Care Beckham Buxbaum: Nicholes Stairs Other Clinician: Referring Kipper Buch: Treating Aidden Markovic/Extender: Nyra Jabs in Treatment: 2 Clinic Level of Care Assessment Items TOOL 4 Quantity  Score X- 1 0 Use when only an EandM is performed on FOLLOW-UP visit ASSESSMENTS - Nursing Assessment / Reassessment X- 1 10 Reassessment of Co-morbidities (includes updates in patient status) X- 1 5 Reassessment of Adherence to Treatment Plan ASSESSMENTS - Wound and Skin A ssessment / Reassessment X - Simple Wound Assessment / Reassessment - one wound 1 5 []  - 0 Complex Wound Assessment / Reassessment - multiple wounds []  - 0 Dermatologic / Skin Assessment (not related to wound area) ASSESSMENTS - Focused Assessment []  - 0 Circumferential Edema Measurements - multi extremities []  - 0 Nutritional Assessment / Counseling / Intervention []  - 0 Lower Extremity Assessment (monofilament, tuning fork, pulses) []  - 0 Peripheral Arterial Disease Assessment (using hand held doppler) ASSESSMENTS - Ostomy and/or Continence Assessment and Care []  - 0 Incontinence Assessment and Management []  - 0 Ostomy Care Assessment and Management (repouching, etc.) PROCESS - Coordination of Care X - Simple Patient / Family Education for ongoing care 1 15 []  - 0 Complex (extensive) Patient / Family Education for ongoing care X- 1 10 Staff obtains Programmer, systems, Records, T Results / Process Orders est []  - 0 Staff telephones HHA, Nursing Homes / Clarify orders / etc []  - 0 Routine Transfer to another Facility (non-emergent condition) []  - 0 Routine Hospital Admission (non-emergent condition) []  - 0 New Admissions / Biomedical engineer / Ordering NPWT Apligraf, etc. , []  - 0 Emergency Hospital Admission (emergent condition) X- 1 10 Simple Discharge Coordination []  - 0 Complex (extensive) Discharge Coordination PROCESS - Special Needs []  - 0 Pediatric / Minor Patient Management []  - 0 Isolation Patient Management []  - 0 Hearing / Language / Visual special needs []  - 0 Assessment of Community assistance (transportation, D/C planning, etc.) []  - 0 Additional assistance / Altered  mentation []  - 0 Support Surface(s) Assessment (bed, cushion, seat, etc.) INTERVENTIONS - Wound Cleansing / Measurement X - Simple Wound Cleansing - one wound 1 5 []  -  0 Complex Wound Cleansing - multiple wounds X- 1 5 Wound Imaging (photographs - any number of wounds) []  - 0 Wound Tracing (instead of photographs) X- 1 5 Simple Wound Measurement - one wound []  - 0 Complex Wound Measurement - multiple wounds INTERVENTIONS - Wound Dressings []  - 0 Small Wound Dressing one or multiple wounds []  - 0 Medium Wound Dressing one or multiple wounds []  - 0 Large Wound Dressing one or multiple wounds []  - 0 Application of Medications - topical []  - 0 Application of Medications - injection INTERVENTIONS - Miscellaneous []  - 0 External ear exam []  - 0 Specimen Collection (cultures, biopsies, blood, body fluids, etc.) []  - 0 Specimen(s) / Culture(s) sent or taken to Lab for analysis []  - 0 Patient Transfer (multiple staff / Civil Service fast streamer / Similar devices) []  - 0 Simple Staple / Suture removal (25 or less) []  - 0 Complex Staple / Suture removal (26 or more) []  - 0 Hypo / Hyperglycemic Management (close monitor of Blood Glucose) []  - 0 Ankle / Brachial Index (ABI) - do not check if billed separately X- 1 5 Vital Signs Has the patient been seen at the hospital within the last three years: Yes Total Score: 75 Level Of Care: New/Established - Level 2 Electronic Signature(s) Signed: 07/01/2020 5:57:23 PM By: Carlene Coria RN Entered By: Carlene Coria on 07/01/2020 13:46:45 -------------------------------------------------------------------------------- Encounter Discharge Information Details Patient Name: Date of Service: Caleb Alken Caleb C. 07/01/2020 12:30 PM Medical Record Number: [id number] Patient Account Number: [id number] Date of Birth/Sex: Treating RN: [birth year] (74 y.o. [name] Primary Care Bolden Hagerman: Nicholes Stairs Other Clinician: Referring Korinne Greenstein: Treating  Gaile Allmon/Extender: Nyra Jabs in Treatment: 2 Encounter Discharge Information Items Discharge Condition: Stable Ambulatory Status: Ambulatory Discharge Destination: Home Transportation: Private Auto Accompanied By: self Schedule Follow-up Appointment: Yes Clinical Summary of Care: Patient Declined Electronic Signature(s) Signed: 07/01/2020 5:18:51 PM By: Kela Millin Entered By: Kela Millin on 07/01/2020 13:55:45 -------------------------------------------------------------------------------- Lower Extremity Assessment Details Patient Name: Date of Service: [name], Caleb Caleb C. 07/01/2020 12:30 PM Medical Record Number: [id number] Patient Account Number: [id number] Date of Birth/Sex: Treating RN: 1946/07/04 (74 y.o. [name] Primary Care Amit Meloy: Nicholes Stairs Other Clinician: Referring Michaelann Gunnoe: Treating Mohamed Portlock/Extender: Nyra Jabs in Treatment: 2 Edema Assessment Assessed: Shirlyn Goltz: No] [Right: No] Edema: [Left: N] [Right: o] Calf Left: Right: Point of Measurement: cm From Medial Instep cm 41 cm Ankle Left: Right: Point of Measurement: cm From Medial Instep cm 23 cm Vascular Assessment Pulses: Dorsalis Pedis Palpable: [Right:Yes] Electronic Signature(s) Signed: 07/01/2020 5:34:53 PM By: Baruch Gouty RN, BSN Entered By: Baruch Gouty on 07/01/2020 13:01:39 -------------------------------------------------------------------------------- Multi Wound Chart Details Patient Name: Date of Service: Caleb Alken Caleb C. 07/01/2020 12:30 PM Medical Record Number: [id number] Patient Account Number: [id number] Date of Birth/Sex: Treating RN: 04-12-1946 (74 y.o. [name] Primary Care Leyan Branden: Nicholes Stairs Other Clinician: Referring Ginnifer Creelman: Treating Thelmer Legler/Extender: Nyra Jabs in Treatment: 2 Vital Signs Height(in): 70 Pulse(bpm): 52 Weight(lbs): 214 Blood  Pressure(mmHg): 128/80 Body Mass Index(BMI): 31 Temperature(F): 98.1 Respiratory Rate(breaths/min): 18 Photos: [2:No Photos Right Metatarsal head fifth] [N/A:N/A N/A] Wound Location: [2:Gradually Appeared] [N/A:N/A] Wounding Event: [2:Neuropathic Ulcer-Non Diabetic] [N/A:N/A] Primary Etiology: [2:Glaucoma, Chronic Obstructive] [N/A:N/A] Comorbid History: [2:Pulmonary Disease (COPD), Sleep Apnea, Hypertension, Neuropathy, Received Chemotherapy, Received Radiation 06/07/2020] [N/A:N/A] Date Acquired: [2:2] [N/A:N/A] Weeks of Treatment: [2:Healed - Epithelialized] [N/A:N/A] Wound Status: [2:0x0x0] [N/A:N/A] Measurements L x W x D (cm) [2:0] [  N/A:N/A] A (cm) : rea [2:0] [N/A:N/A] Volume (cm) : [2:100.00%] [N/A:N/A] % Reduction in Area: [2:100.00%] [N/A:N/A] % Reduction in Volume: [2:Full Thickness Without Exposed] [N/A:N/A] Classification: [2:Support Structures None Present] [N/A:N/A] Exudate Amount: [2:None Present (0%)] [N/A:N/A] Granulation Amount: [2:None Present (0%)] [N/A:N/A] Necrotic Amount: [2:Fat Layer (Subcutaneous Tissue)] [N/A:N/A] Exposed Structures: [2:Exposed: Yes Fascia: No Tendon: No Muscle: No Joint: No Bone: No None] [N/A:N/A] Treatment Notes Electronic Signature(s) Signed: 07/01/2020 5:16:39 PM By: Linton Ham MD Signed: 07/01/2020 5:57:23 PM By: Carlene Coria RN Entered By: Linton Ham on 07/01/2020 13:48:10 -------------------------------------------------------------------------------- [place] Details Patient Name: Date of Service: Caleb Alken Caleb C. 07/01/2020 12:30 PM Medical Record Number: [id number] Patient Account Number: [id number] Date of Birth/Sex: Treating RN: [birth year] (74 y.o. [name] Primary Care Daana Petrasek: Nicholes Stairs Other Clinician: Referring Maven Rosander: Treating Bralyn Folkert/Extender: Nyra Jabs in Treatment: 2 Active Inactive Electronic Signature(s) Signed: 07/01/2020 5:57:23 PM  By: Carlene Coria RN Entered By: Carlene Coria on 07/01/2020 13:46:07 -------------------------------------------------------------------------------- Pain Assessment Details Patient Name: Date of Service: Caleb Alken Caleb C. 07/01/2020 12:30 PM Medical Record Number: [id number] Patient Account Number: [id number] Date of Birth/Sex: Treating RN: March 18, 1946 (74 y.o. [name] Primary Care Eriana Suliman: Nicholes Stairs Other Clinician: Referring Annita Ratliff: Treating Shealynn Saulnier/Extender: Nyra Jabs in Treatment: 2 Active Problems Location of Pain Severity and Description of Pain Patient Has Paino No Site Locations Pain Management and Medication Current Pain Management: Electronic Signature(s) Signed: 07/01/2020 5:57:23 PM By: Carlene Coria RN Signed: 07/03/2020 10:46:54 AM By: Sandre Kitty Entered By: Sandre Kitty on 07/01/2020 12:51:45 -------------------------------------------------------------------------------- Patient/Caregiver Education Details Patient Name: Date of Service: Caleb Alken Caleb C. 7/13/2021andnbsp12:30 PM Medical Record Number: [id number] Patient Account Number: [id number] Date of Birth/Gender: Treating RN: June 28, 1946 (74 y.o. Staci Acosta, [place] Primary Care Physician: Nicholes Stairs Other Clinician: Referring Physician: Treating Physician/Extender: Nyra Jabs in Treatment: 2 Education Assessment Education Provided To: Patient Education Topics Provided Wound/Skin Impairment: Methods: Explain/Verbal Responses: State content correctly Electronic Signature(s) Signed: 07/01/2020 5:57:23 PM By: Carlene Coria RN Entered By: Carlene Coria on 07/01/2020 13:29:16 -------------------------------------------------------------------------------- Wound Assessment Details Patient Name: Date of Service: [name], Caleb Caleb C. 07/01/2020 12:30 PM Medical Record Number: [id number] Patient Account Number: [id number] Date of  Birth/Sex: Treating RN: June 23, 1946 (74 y.o. Jerilynn Mages) Carlene Coria Primary Care Lynesha Bango: Nicholes Stairs Other Clinician: Referring Dennice Tindol: Treating Amilio Zehnder/Extender: Mamie Laurel Weeks in Treatment: 2 Wound Status Wound Number: 2 Primary Neuropathic Ulcer-Non Diabetic Etiology: Wound Location: Right Metatarsal head fifth Wound Healed - Epithelialized Wounding Event: Gradually Appeared Status: Date Acquired: 06/07/2020 Comorbid Glaucoma, Chronic Obstructive Pulmonary Disease (COPD), Sleep Weeks Of Treatment: 2 History: Apnea, Hypertension, Neuropathy, Received Chemotherapy, Clustered Wound: No Received Radiation Wound Measurements Length: (cm) Width: (cm) Depth: (cm) Area: (cm) Volume: (cm) 0 % Reduction in Area: 100% 0 % Reduction in Volume: 100% 0 Epithelialization: None 0 Tunneling: No 0 Undermining: No Wound Description Classification: Full Thickness Without Exposed Support Structures Exudate Amount: None Present Foul Odor After Cleansing: No Slough/Fibrino No Wound Bed Granulation Amount: None Present (0%) Exposed Structure Necrotic Amount: None Present (0%) Fascia Exposed: No Fat Layer (Subcutaneous Tissue) Exposed: Yes Tendon Exposed: No Muscle Exposed: No Joint Exposed: No Bone Exposed: No Electronic Signature(s) Signed: 07/01/2020 5:57:23 PM By: Carlene Coria RN Entered By: Carlene Coria on 07/01/2020 13:47:36 -------------------------------------------------------------------------------- [place] Details Patient Name: Date of Service: Caleb Alken Caleb C. 07/01/2020 12:30 PM Medical Record Number: [id number] Patient Account Number: [id number]  Date of Birth/Sex: Treating RN: 10-19-1946 (74 y.o. Jerilynn Mages) Carlene Coria Primary Care Marcellus Pulliam: Nicholes Stairs Other Clinician: Referring Zeph Riebel: Treating Alexiz Cothran/Extender: Nyra Jabs in Treatment: 2 Vital Signs Time Taken: 12:51 Temperature (F): 98.1 Height (in):  70 Pulse (bpm): 85 Weight (lbs): 214 Respiratory Rate (breaths/min): 18 Body Mass Index (BMI): 30.7 Blood Pressure (mmHg): 128/80 Reference Range: 80 - 120 mg / dl Electronic Signature(s) Signed: 07/03/2020 10:46:54 AM By: Sandre Kitty Entered By: Sandre Kitty on 07/01/2020 12:51:40

## 2020-07-07 ENCOUNTER — Ambulatory Visit (INDEPENDENT_AMBULATORY_CARE_PROVIDER_SITE_OTHER): Payer: Medicare Other | Admitting: Podiatry

## 2020-07-07 ENCOUNTER — Other Ambulatory Visit: Payer: Self-pay

## 2020-07-07 ENCOUNTER — Encounter: Payer: Self-pay | Admitting: Podiatry

## 2020-07-07 DIAGNOSIS — Q666 Other congenital valgus deformities of feet: Secondary | ICD-10-CM | POA: Diagnosis not present

## 2020-07-07 DIAGNOSIS — D689 Coagulation defect, unspecified: Secondary | ICD-10-CM

## 2020-07-07 DIAGNOSIS — M21621 Bunionette of right foot: Secondary | ICD-10-CM | POA: Diagnosis not present

## 2020-07-07 DIAGNOSIS — L84 Corns and callosities: Secondary | ICD-10-CM

## 2020-07-08 ENCOUNTER — Encounter: Payer: Self-pay | Admitting: Podiatry

## 2020-07-08 NOTE — Progress Notes (Signed)
Subjective:  Patient ID: [name], male    DOB: [birth year],  MRN: [id number]  Chief Complaint  Patient presents with  . Foot Ulcer    right ball of foot ,healing, just finished wound center 1 week after breaking down for the 2nd time since May    74 y.o. male presents with the above complaint.  Patient presents with pain to the right lateral aspect of submetatarsal 5.  Patient states that this callus/hyperkeratotic lesion has been very painful.  Patient states that he would like to discuss long-term management including surgical and conservative management.  He has not tried any orthotics.  He has tried multiple debridements which appears to come back within couple of months.  He states that he is not getting adequate pain relief.  He would like to discuss options that he has available.  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   Review of Systems: Negative except as noted in the HPI. Denies N/V/F/Ch.  Past Medical History:  Diagnosis Date  . Adenocarcinoma of right lung, stage 1 ([place]) 03/03/2017  . Cancer ([place])    lung cancer 2005  . Cancer ([place])    skin cancer, mouth cancer  . COPD (chronic obstructive pulmonary disease) ([place])   . Glaucoma   . Hereditary and idiopathic peripheral neuropathy 09/26/2014  . Hyperlipidemia   . Hypertension   . Ischemic colitis ([place])   . 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 Polio   . Sleep apnea    no CPAP    Current Outpatient Medications:  .  carvedilol (COREG) 3.125 MG tablet, Take 3.125 mg by mouth 2 (two) times daily with a meal., Disp: , Rfl:  .  latanoprost (XALATAN) 0.005 % ophthalmic solution, Place 1 drop into both eyes at bedtime., Disp: , Rfl:  .  losartan (COZAAR) 50 MG tablet, Take 50 mg by mouth daily., Disp: , Rfl:  .  Multiple Vitamin (MULTIVITAMIN WITH MINERALS) TABS tablet, Take 1 tablet by mouth daily., Disp: , Rfl:  .  Tiotropium Bromide Monohydrate (SPIRIVA HANDIHALER IN), Inhale 1 Inhaler into the lungs.,  Disp: , Rfl:  .  warfarin (COUMADIN) 5 MG tablet, Take 1 tablet (5 mg total) by mouth as directed. (Patient taking differently: Take 5-7.5 mg by mouth See admin instructions. Monday, Wednesday, and Friday 7.5 mg (1.5 tablets) & 5 mg (1 tablet) all other days.), Disp: 125 tablet, Rfl: 4  Social History   Tobacco Use  Smoking Status Former Smoker  . Packs/day: 2.00  . Years: 40.00  . Pack years: 80.00  . Types: Cigarettes  . Quit date: 2004  . Years since quitting: 17.5  Smokeless Tobacco Never Used    Allergies  Allergen Reactions  . No Known Allergies    Objective:  There were no vitals filed for this visit. There is no height or weight on file to calculate BMI. Constitutional Well developed. Well nourished.  Vascular Dorsalis pedis pulses palpable bilaterally. Posterior tibial pulses palpable bilaterally. Capillary refill normal to all digits.  No cyanosis or clubbing noted. Pedal hair growth normal.  Neurologic Normal speech. Oriented to person, place, and time. Epicritic sensation to light touch grossly present bilaterally.  Dermatologic Nails well groomed and normal in appearance. No open wounds. No skin lesions.  Orthopedic:  Pain on palpation of the fifth metatarsophalangeal joint at the site of submetatarsal 5 with hyperkeratotic lesion.  Pain on palpation to the lesion.  No pain with range of motion of the fifth metatarsophalangeal joint.  Clinically able to palpate the tailor's bunion deformity.  Appears to be reducible in nature.   Radiographs: None Assessment:   1. Tailor's bunion of right foot   2. 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 Corns and callosities   4. Pes planovalgus    Plan:  Patient was evaluated and treated and all questions answered.  Right submetatarsal 5 preulcerative lesion with underlying pes planovalgus -I explained to the patient the etiology of the lesion with this relationship with gait mechanics and various treatment options were  extensively discussed.  Patient does have tailor's bunion with submetatarsal 5 hyperkeratotic lesion.  I believe patient will benefit from aggressive offloading of the lesion and would like to proceed with conservative options for now.  If there is no improvement we will discuss surgical intervention with correction of the tailor's bunion as well as elevating the metatarsal head.  Patient agrees with the plan. -He will be scheduled to see [facility] for custom-made orthotics with offloading the right submetatarsal 5 preulcerative lesion.  I discussed with the patient the full financial cost of them.  No follow-ups on file.

## 2020-07-16 ENCOUNTER — Ambulatory Visit (INDEPENDENT_AMBULATORY_CARE_PROVIDER_SITE_OTHER): Payer: Medicare Other | Admitting: Orthotics

## 2020-07-16 ENCOUNTER — Other Ambulatory Visit: Payer: Self-pay

## 2020-07-16 DIAGNOSIS — L84 Corns and callosities: Secondary | ICD-10-CM

## 2020-07-16 DIAGNOSIS — D689 Coagulation defect, unspecified: Secondary | ICD-10-CM | POA: Diagnosis not present

## 2020-07-16 DIAGNOSIS — Q666 Other congenital valgus deformities of feet: Secondary | ICD-10-CM

## 2020-07-16 DIAGNOSIS — M21621 Bunionette of right foot: Secondary | ICD-10-CM

## 2020-07-16 DIAGNOSIS — L97511 Non-pressure chronic ulcer of other part of right foot limited to breakdown of skin: Secondary | ICD-10-CM | POA: Diagnosis not present

## 2020-07-16 NOTE — Progress Notes (Signed)
F/o cast to offload painful 5th met callus/tailors bunion RIGHT; also hug arch due to cavus foot type

## 2020-07-18 ENCOUNTER — Encounter (HOSPITAL_BASED_OUTPATIENT_CLINIC_OR_DEPARTMENT_OTHER): Payer: Medicare Other | Admitting: Physician Assistant

## 2020-07-18 DIAGNOSIS — G9009 Other idiopathic peripheral autonomic neuropathy: Secondary | ICD-10-CM | POA: Diagnosis not present

## 2020-07-18 NOTE — Progress Notes (Addendum)
[name], [name] ([id number]) Visit Report for 07/18/2020 Chief Complaint Document Details Patient Name: Date of Service: LULA, MICHAUX RD C. 07/18/2020 8:30 A M Medical Record Number: [id number] Patient Account Number: [id number] Date of Birth/Sex: Treating RN: April 11, 1946 (74 y.o. [name] Primary Care Provider: Nicholes Stairs Other Clinician: Referring Provider: Treating Provider/Extender: Dominga Ferry in Treatment: 5 Information Obtained from: Patient Chief Complaint Wound exam; patient is here for review of the wound on the right fifth met metatarsal head Electronic Signature(s) Signed: 07/18/2020 9:07:12 AM By: Worthy Keeler PA-C Entered By: Worthy Keeler on 07/18/2020 09:07:11 -------------------------------------------------------------------------------- HPI Details Patient Name: Date of Service: [name] RD C. 07/18/2020 8:30 A M Medical Record Number: [id number] Patient Account Number: [id number] Date of Birth/Sex: Treating RN: 07-19-1946 (74 y.o. [name] Primary Care Provider: Nicholes Stairs Other Clinician: Referring Provider: Treating Provider/Extender: Dominga Ferry in Treatment: 5 History of Present Illness HPI Description: ADMISSION 03/28/2020 This is a 74 year old retired man who has a history of neuropathy nondiabetic apparently felt to be secondary to chemotherapy he received this part of treatment for adeno CA of the lung many years ago. Apparently this started about a month ago. He was seen in the walk-in clinic and had cellulitis. Noted to have a draining wound on the plantar aspect of his right foot. He was given Rocephin and treated with doxycycline. And follow-up with primary care culture showed MRSA also resistant to quinolones. He was given doxycycline and clindamycin. When he was seen in follow-up on March 22 his doxycycline was refilled. The wound looked better from an infection point  of view. He has finished his doxycycline. He has been using Bactroban to the wound not offloading this at all. He walks around in bare feet in the house. 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 noncompressible and 1.33 on the left also noncompressible 4/16; patient with a nondiabetic idiopathic peripheral neuropathy. Neuropathic wound presented to the clinic last week on the plantar right fifth met head. This became secondarily infected treated by his primary physician and walk-in clinics. He has not shown any evidence of infection. We gave him a forefoot offloading boot. Fortunately he was able to manage to work with this. He arrives in the clinic today with this wound almost healed. We were using silver collagen 4/23; wound on the plantar aspect of the right fifth metatarsal head. I thought this might be close this week however it is not. We have been using silver alginate 4/30; the wound on the plantar aspect of the fifth metatarsal head in a man with a idiopathic nondiabetic peripheral neuropathy. The area is closed this week. He still has a lot of callus around this wound indicative of pressure/friction when he walks. Readmission: 06/11/2020 upon evaluation today patient presents for reevaluation here in our clinic concerning a reopening of the wound on the lateral portion of his foot which is in the same location where we previously took care of him back in April of this year. Fortunately there does not appear to be any signs of active infection at this time it does appear that he developed a blister that unfortunately opened over the weekend after they called  in concerned about this wanting to get in sooner rather than later. With  that being said he did see his primary care provider this past Monday that had a culture that has not come back yet. With that being said there does not appear to be any signs of active infection at this time and overall I feel like that is really not a significant issue currently. With regard to the antibiotics his primary care provider did not give him any antibiotics as they did not feel it was infected either. 06/18/2020 upon evaluation today patient appears to be doing fairly well in regard to his plantar foot ulcer. I am actually very pleased with where things stand and overall feeling the patient is managing quite nicely. With that being said I do not see any signs of active infection at this time. No fevers, chills, nausea, vomiting, or diarrhea. 7/13; right plantar foot ulcer of the fifth metatarsal head. Once again this is closed. Slight amount of callus but there is nothing open nothing looks ominous. Slight subluxation of the fifth metatarsal head at about a distance equal to the first. They have an appointment with podiatry next Monday. 07/18/2020 on evaluation today patient appears to be doing a little more poorly in regard to a wound on his left fourth toe. This appears to be a blister that is likely secondary to friction in my opinion. There does not appear to be any signs of infection at this time which is good news. With that being said I feel like that he has made excellent progress in regard to the original wound that I saw him for although Dr. Dellia Nims did heal everything out when I was on vacation and he saw the patient. That is excellent news. Nonetheless in regard to his fourth toe at this point I think that we need to go ahead and try to do what we can to protect this from worsening. I think there are definitely some recommendations we can make here today. Electronic Signature(s) Signed: 07/18/2020 9:23:52 AM By: Worthy Keeler PA-C Entered By: Worthy Keeler on 07/18/2020  09:23:52 -------------------------------------------------------------------------------- Physical Exam Details Patient Name: Date of Service: [name] RD C. 07/18/2020 8:30 A M Medical Record Number: [id number] Patient Account Number: [id number] Date of Birth/Sex: Treating RN: 13-Apr-1946 (74 y.o. [name] Primary Care Provider: Nicholes Stairs Other Clinician: Referring Provider: Treating Provider/Extender: Dominga Ferry in Treatment: 5 Constitutional Well-nourished and well-hydrated in no acute distress. Respiratory normal breathing without difficulty. Psychiatric this patient is able to make decisions and demonstrates good insight into disease process. Alert and Oriented x 3. pleasant and cooperative. Notes Patient's wound bed did not require sharp debridement at this point which was great news. With that being said I do believe that he is going to require treatment with mupirocin along with a small amount of collagen to help this to heal more effectively and quickly. I think we can just moisten the collagen with the mupirocin. With that being said I do believe that he is getting need a next care bandage to protect this from a waterproof standpoint as he is going to the beach and does plan to get in the water. He does not need to get any of the water on this toe and I told him that he needs to be as careful as possible in that regard. Obviously I think that ideally would be just to stay out of the water but as a  no is not going to we did discuss the next care bandages which are actually waterproof and should help in that regard. Electronic Signature(s) Signed: 07/18/2020 9:25:02 AM By: Worthy Keeler PA-C Entered By: Worthy Keeler on 07/18/2020 09:25:02 -------------------------------------------------------------------------------- Physician Orders Details Patient Name: Date of Service: [name] RD C. 07/18/2020 8:30 A M Medical Record  Number: [id number] Patient Account Number: [id number] Date of Birth/Sex: Treating RN: May 21, 1946 (74 y.o. [name] Primary Care Provider: Nicholes Stairs Other Clinician: Referring Provider: Treating Provider/Extender: Dominga Ferry in Treatment: 5 Verbal / Phone Orders: No Diagnosis Coding ICD-10 Coding Code Description G90.09 Other idiopathic peripheral autonomic neuropathy L97.512 Non-pressure chronic ulcer of other part of right foot with fat layer exposed I10 Essential (primary) hypertension Follow-up Appointments Return appointment in 3 weeks. - when you return from vacation Dressing Change Frequency Wound #3 Left T Fourth oe Change Dressing every other day. Wound Cleansing May shower and wash wound with soap and water. - when dressing is changed Primary Wound Dressing Silver Collagen - moisten with mupirocin Secondary Dressing Wound #3 Left T Fourth oe Kerlix/Rolled Gauze - or tape Dry Gauze Electronic Signature(s) Signed: 07/18/2020 5:02:39 PM By: Kela Millin Signed: 07/18/2020 5:19:16 PM By: Worthy Keeler PA-C Entered By: Kela Millin on 07/18/2020 09:20:14 -------------------------------------------------------------------------------- Problem List Details Patient Name: Date of Service: [name] RD C. 07/18/2020 8:30 A M Medical Record Number: [id number] Patient Account Number: [id number] Date of Birth/Sex: Treating RN: 1946-03-27 (74 y.o. [name] Primary Care Provider: Nicholes Stairs Other Clinician: Referring Provider: Treating Provider/Extender: Dominga Ferry in Treatment: 5 Active Problems ICD-10 Encounter Code Description Active Date MDM Diagnosis G90.09 Other idiopathic peripheral autonomic neuropathy 06/11/2020 No Yes L97.512 Non-pressure chronic ulcer of other part of right foot with fat layer exposed 06/11/2020 No Yes I10 Essential (primary) hypertension 06/11/2020  No Yes Inactive Problems Resolved Problems Electronic Signature(s) Signed: 07/18/2020 9:07:04 AM By: Worthy Keeler PA-C Entered By: Worthy Keeler on 07/18/2020 09:07:04 -------------------------------------------------------------------------------- Progress Note Details Patient Name: Date of Service: [name] RD C. 07/18/2020 8:30 A M Medical Record Number: [id number] Patient Account Number: [id number] Date of Birth/Sex: Treating RN: [birth year] (74 y.o. [name] Primary Care Provider: Nicholes Stairs Other Clinician: Referring Provider: Treating Provider/Extender: Dominga Ferry in Treatment: 5 Subjective Chief Complaint Information obtained from Patient Wound exam; patient is here for review of the wound on the right fifth met metatarsal head History of Present Illness (HPI) ADMISSION 03/28/2020 This is a 74 year old retired man who has a history of neuropathy nondiabetic apparently felt to be secondary to chemotherapy he received this part of treatment for adeno CA of the lung many years ago. Apparently this started about a month ago. He was seen in the walk-in clinic and had cellulitis. Noted to have a draining wound on the plantar aspect of his right foot. He was given Rocephin and treated with doxycycline. And follow-up with primary care culture showed MRSA also resistant to quinolones. He was given doxycycline and clindamycin. When he was seen in follow-up on March 22 his doxycycline was refilled. The wound looked better from an infection point of view. He has finished his doxycycline. He has been using Bactroban to the wound not offloading this at all. He walks around in bare feet in the house. 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 He has a history of upper extremity DVT on  anticoagulants ABIs in our clinic were 1.52 on the right i.e. noncompressible and 1.33 on the left also noncompressible 4/16; patient with a nondiabetic idiopathic peripheral neuropathy. Neuropathic wound presented to the clinic last week on the plantar right fifth met head. This became secondarily infected treated by his primary physician and walk-in clinics. He has not shown any evidence of infection. We gave him a forefoot offloading boot. Fortunately he was able to manage to work with this. He arrives in the clinic today with this wound almost healed. We were using silver collagen 4/23; wound on the plantar aspect of the right fifth metatarsal head. I thought this might be close this week however it is not. We have been using silver alginate 4/30; the wound on the plantar aspect of the fifth metatarsal head in a man with a idiopathic nondiabetic peripheral neuropathy. The area is closed this week. He still has a lot of callus around this wound indicative of pressure/friction when he walks. Readmission: 06/11/2020 upon evaluation today patient presents for reevaluation here in our clinic concerning a reopening of the wound on the lateral portion of his foot which is in the same location where we previously took care of him back in April of this year. Fortunately there does not appear to be any signs of active infection at this time it does appear that he developed a blister that unfortunately opened over the weekend after they called in concerned about this wanting to get in sooner rather than later. With that being said he did see his primary care provider this past Monday that had a culture that has not come back yet. With that being said there does not appear to be any signs of active infection at this time and overall I feel like that is really not a significant issue currently. With regard to the antibiotics his primary care provider did not give him any antibiotics as they did not feel it was  infected either. 06/18/2020 upon evaluation today patient appears to be doing fairly well in regard to his plantar foot ulcer. I am actually very pleased with where things stand and overall feeling the patient is managing quite nicely. With that being said I do not see any signs of active infection at this time. No fevers, chills, nausea, vomiting, or diarrhea. 7/13; right plantar foot ulcer of the fifth metatarsal head. Once again this is closed. Slight amount of callus but there is nothing open nothing looks ominous. Slight subluxation of the fifth metatarsal head at about a distance equal to the first. They have an appointment with podiatry next Monday. 07/18/2020 on evaluation today patient appears to be doing a little more poorly in regard to a wound on his left fourth toe. This appears to be a blister that is likely secondary to friction in my opinion. There does not appear to be any signs of infection at this time which is good news. With that being said I feel like that he has made excellent progress in regard to the original wound that I saw him for although Dr. Dellia Nims did heal everything out when I was on vacation and he saw the patient. That is excellent news. Nonetheless in regard to his fourth toe at this point I think that we need to go ahead and try to do what we can to protect this from worsening. I  think there are definitely some recommendations we can make here today. Objective Constitutional Well-nourished and well-hydrated in no acute distress. Vitals Time Taken: 8:57 AM, Height: 70 in, Weight: 214 lbs, BMI: 30.7, Temperature: 98.3 F, Pulse: 83 bpm, Respiratory Rate: 18 breaths/min, Blood Pressure: 133/78 mmHg. Respiratory normal breathing without difficulty. Psychiatric this patient is able to make decisions and demonstrates good insight into disease process. Alert and Oriented x 3. pleasant and cooperative. General Notes: Patient's wound bed did not require sharp debridement  at this point which was great news. With that being said I do believe that he is going to require treatment with mupirocin along with a small amount of collagen to help this to heal more effectively and quickly. I think we can just moisten the collagen with the mupirocin. With that being said I do believe that he is getting need a next care bandage to protect this from a waterproof standpoint as he is going to the beach and does plan to get in the water. He does not need to get any of the water on this toe and I told him that he needs to be as careful as possible in that regard. Obviously I think that ideally would be just to stay out of the water but as a no is not going to we did discuss the next care bandages which are actually waterproof and should help in that regard. Integumentary (Hair, Skin) Wound #3 status is Open. Original cause of wound was Gradually Appeared. The wound is located on the Left T Fourth. The wound measures 0.5cm length x oe 0.5cm width x 0.1cm depth; 0.196cm^2 area and 0.02cm^3 volume. There is Fat Layer (Subcutaneous Tissue) Exposed exposed. There is no tunneling or undermining noted. There is a medium amount of serosanguineous drainage noted. There is medium (34-66%) red granulation within the wound bed. There is a medium (34-66%) amount of necrotic tissue within the wound bed including Adherent Slough. Assessment Active Problems ICD-10 Other idiopathic peripheral autonomic neuropathy Non-pressure chronic ulcer of other part of right foot with fat layer exposed Essential (primary) hypertension Plan Follow-up Appointments: Return appointment in 3 weeks. - when you return from vacation Dressing Change Frequency: Wound #3 Left T Fourth: oe Change Dressing every other day. Wound Cleansing: May shower and wash wound with soap and water. - when dressing is changed Primary Wound Dressing: Silver Collagen - moisten with mupirocin Secondary Dressing: Wound #3 Left T  Fourth: oe Kerlix/Rolled Gauze - or tape Dry Gauze 1. I would recommend currently that we go ahead and initiate a continuation of treatment with the mupirocin which has helped up to this point prior to him coming in to see me today. I am also can recommend that we go ahead and have him use a little bit of collagen underneath the mupirocin him mostly when he mupirocin and then subsequently a next care bandage over top. 2. I am also can recommend that the patient continue to keep this covered at all times especially at the beach she needs to keep stand and ocean water obviously off of this area. 3. I am also can recommend that he needs to continue to monitor for any signs of worsening or infection if anything happens he should contact the office and let me know. We will see patient back for reevaluation in 3 weeks here in the clinic. If anything worsens or changes patient will contact our office for additional recommendations. Electronic Signature(s) Signed: 07/18/2020 9:26:19 AM By: Worthy Keeler PA-C  Entered By: Worthy Keeler on 07/18/2020 09:26:19 -------------------------------------------------------------------------------- SuperBill Details Patient Name: Date of Service: ISAMU, TRAMMEL RD [name] 07/18/2020 Medical Record Number: [id number] Patient Account Number: [id number] Date of Birth/Sex: Treating RN: 17-May-1946 (74 y.o. [name] Primary Care Provider: Nicholes Stairs Other Clinician: Referring Provider: Treating Provider/Extender: Dominga Ferry in Treatment: 5 Diagnosis Coding ICD-10 Codes Code Description G90.09 Other idiopathic peripheral autonomic neuropathy L97.512 Non-pressure chronic ulcer of other part of right foot with fat layer exposed I10 Essential (primary) hypertension Facility Procedures CPT4 Code: [id number] Description: 99213 - WOUND CARE VISIT-LEV 3 EST PT Modifier: Quantity: 1 Physician Procedures : CPT4 Code Description  Modifier [id number] - WC PHYS LEVEL 3 - EST PT ICD-10 Diagnosis Description G90.09 Other idiopathic peripheral autonomic neuropathy L97.512 Non-pressure chronic ulcer of other part of right foot with fat layer exposed I10  Essential (primary) hypertension Quantity: 1 Electronic Signature(s) Signed: 07/18/2020 9:26:33 AM By: Worthy Keeler PA-C Entered By: Worthy Keeler on 07/18/2020 09:26:32

## 2020-07-18 NOTE — Progress Notes (Signed)
KHADEN, GATER ([id number]) Visit Report for 07/18/2020 Arrival Information Details Patient Name: Date of Service: LAFAYETTE, DUNLEVY RD C. 07/18/2020 8:30 A M Medical Record Number: [id number] Patient Account Number: [id number] Date of Birth/Sex: Treating RN: 06/26/1946 (74 y.o. Jerilynn Mages) Carlene Coria Primary Care Arshan Jabs: Nicholes Stairs Other Clinician: Referring Lyndon Chapel: Treating Rocco Kerkhoff/Extender: Dominga Ferry in Treatment: 5 Visit Information History Since Last Visit All ordered tests and consults were completed: No Patient Arrived: Ambulatory Added or deleted any medications: No Arrival Time: 08:52 Any new allergies or adverse reactions: No Accompanied By: wife Had a fall or experienced change in No Transfer Assistance: None activities of daily living that may affect Patient Identification Verified: Yes risk of falls: Secondary Verification Process Completed: Yes Signs or symptoms of abuse/neglect since last visito No Patient Requires Transmission-Based Precautions: No Hospitalized since last visit: No Patient Has Alerts: Yes Implantable device outside of the clinic excluding No Patient Alerts: Patient on Blood Thinner cellular tissue based products placed in the center since last visit: Has Dressing in Place as Prescribed: Yes Pain Present Now: No Electronic Signature(s) Signed: 07/18/2020 4:58:30 PM By: Carlene Coria RN Entered By: Carlene Coria on 07/18/2020 08:56:39 -------------------------------------------------------------------------------- Clinic Level of Care Assessment Details Patient Name: Date of Service: VENCENT, HAUSCHILD RD C. 07/18/2020 8:30 A M Medical Record Number: [id number] Patient Account Number: [id number] Date of Birth/Sex: Treating RN: 09-05-1946 (74 y.o. [name] Primary Care Stratton Villwock: Nicholes Stairs Other Clinician: Referring Seniyah Esker: Treating Yareliz Thorstenson/Extender: Dominga Ferry in Treatment:  5 Clinic Level of Care Assessment Items TOOL 4 Quantity Score X- 1 0 Use when only an EandM is performed on FOLLOW-UP visit ASSESSMENTS - Nursing Assessment / Reassessment X- 1 10 Reassessment of Co-morbidities (includes updates in patient status) X- 1 5 Reassessment of Adherence to Treatment Plan ASSESSMENTS - Wound and Skin A ssessment / Reassessment X - Simple Wound Assessment / Reassessment - one wound 1 5 _0  - 0 Complex Wound Assessment / Reassessment - multiple wounds _1  - 0 Dermatologic / Skin Assessment (not related to wound area) ASSESSMENTS - Focused Assessment X- 1 5 Circumferential Edema Measurements - multi extremities _2  - 0 Nutritional Assessment / Counseling / Intervention _3  - 0 Lower Extremity Assessment (monofilament, tuning fork, pulses) _4  - 0 Peripheral Arterial Disease Assessment (using hand held doppler) ASSESSMENTS - Ostomy and/or Continence Assessment and Care _5  - 0 Incontinence Assessment and Management _6  - 0 Ostomy Care Assessment and Management (repouching, etc.) PROCESS - Coordination of Care X - Simple Patient / Family Education for ongoing care 1 15 _7  - 0 Complex (extensive) Patient / Family Education for ongoing care X- 1 10 Staff obtains Programmer, systems, Records, T Results / Process Orders est _8  - 0 Staff telephones HHA, Nursing Homes / Clarify orders / etc _9  - 0 Routine Transfer to another Facility (non-emergent condition) _10  - 0 Routine Hospital Admission (non-emergent condition) _11  - 0 New Admissions / Biomedical engineer / Ordering NPWT Apligraf, etc. , _12  - 0 Emergency Hospital Admission (emergent condition) X- 1 10 Simple Discharge Coordination _13  - 0 Complex (extensive) Discharge Coordination PROCESS - Special Needs _14  - 0 Pediatric / Minor Patient Management _15  - 0 Isolation Patient Management _16  - 0 Hearing / Language / Visual special needs _17  - 0 Assessment of Community assistance (transportation, D/C planning,  etc.) _18  - 0 Additional assistance / Altered mentation _19  - 0 Support Surface(s) Assessment (bed, cushion, seat, etc.) INTERVENTIONS - Wound Cleansing /  Measurement X - Simple Wound Cleansing - one wound 1 5 _0  - 0 Complex Wound Cleansing - multiple wounds X- 1 5 Wound Imaging (photographs - any number of wounds) _1  - 0 Wound Tracing (instead of photographs) X- 1 5 Simple Wound Measurement - one wound _2  - 0 Complex Wound Measurement - multiple wounds INTERVENTIONS - Wound Dressings X - Small Wound Dressing one or multiple wounds 1 10 _3  - 0 Medium Wound Dressing one or multiple wounds _4  - 0 Large Wound Dressing one or multiple wounds X- 1 5 Application of Medications - topical <[id number]>_6<\/[id number]>_1  - 0 Application of Medications - injection INTERVENTIONS - Miscellaneous _6  - 0 External ear exam _7  - 0 Specimen Collection (cultures, biopsies, blood, body fluids, etc.) _8  - 0 Specimen(s) / Culture(s) sent or taken to Lab for analysis _9  - 0 Patient Transfer (multiple staff / Civil Service fast streamer / Similar devices) _10  - 0 Simple Staple / Suture removal (25 or less) _11  - 0 Complex Staple / Suture removal (26 or more) _12  - 0 Hypo / Hyperglycemic Management (close monitor of Blood Glucose) _13  - 0 Ankle / Brachial Index (ABI) - do not check if billed separately X- 1 5 Vital Signs Has the patient been seen at the hospital within the last three years: Yes Total Score: 95 Level Of Care: New/Established - Level 3 Electronic Signature(s) Signed: 07/18/2020 5:02:39 PM By: Kela Millin Entered By: Kela Millin on 07/18/2020 09:21:03 -------------------------------------------------------------------------------- Encounter Discharge Information Details Patient Name: Date of Service: [name] 07/18/2020 8:30 A M Medical Record Number: [id number] Patient Account Number: [id number] Date of Birth/Sex: Treating RN: [birth year] (74 y.o. [name] Primary Care Zabria Liss: Nicholes Stairs Other Clinician: Referring Shaindy Reader: Treating Teyona Nichelson/Extender: Dominga Ferry in Treatment: 5 Encounter Discharge Information Items Discharge Condition: Stable Ambulatory Status: Ambulatory Discharge Destination: Home Transportation: Private Auto Accompanied By: wife Schedule Follow-up Appointment: Yes Clinical Summary of Care: Patient Declined Electronic Signature(s) Signed: 07/18/2020 5:02:39 PM By: Kela Millin Entered By: Kela Millin on 07/18/2020 09:22:11 -------------------------------------------------------------------------------- Lower Extremity Assessment Details Patient Name: Date of Service: [name] 07/18/2020 8:30 A M Medical Record Number: [id number] Patient Account Number: [id number] Date of Birth/Sex: Treating RN: [birth year] (74 y.o. Jerilynn Mages) Carlene Coria Primary Care Ivannah Zody: Nicholes Stairs Other Clinician: Referring Abad Manard: Treating Reina Wilton/Extender: Dominga F[place] in Treatment: 5 Edema Assessment Assessed: [Left: No] [Right: No] Edema: [Left: N] [Right: o] Calf Left: Right: Point of Measurement: cm From Medial Instep cm 40 cm Ankle Left: Right: Point of Measurement: cm From Medial Instep cm 23 cm Electronic Signature(s) Signed: 07/18/2020 4:58:30 PM By: Carlene Coria RN Entered By: Carlene Coria on 07/18/2020 08:59:49 -------------------------------------------------------------------------------- Multi-Disciplinary Care Plan Details Patient Name: Date of Service: [name] 07/18/2020 8:30 A M Medical Record Number: [id number] Patient Account Number: [id number] Date of Birth/Sex: Treating RN: 03/01/1946 (74 y.o. [name] Primary Care Willeen Novak: Nicholes Stairs Other Clinician: Referring Areya Lemmerman: Treating Stuart Mirabile/Extender: Dominga F[place] in Treatment: 5 Active Inactive Peripheral Neuropathy Nursing Diagnoses: Knowledge deficit  related to disease process and management of peripheral neurovascular dysfunction Potential alteration in peripheral tissue perfusion (select prior to confirmation of diagnosis) Goals: Patient/caregiver will verbalize understanding of disease process and disease management Date Initiated: 07/18/2020 Target Resolution Date: 08/15/2020 Goal Status: Active Interventions: Assess signs and symptoms of neuropathy upon admission and as needed Provide education on Management of Neuropathy and Related Ulcers Provide  education on Management of Neuropathy upon discharge from the [facility] Notes: Wound/Skin Impairment Nursing Diagnoses: Impaired tissue integrity Knowledge deficit related to ulceration/compromised skin integrity Goals: Patient/caregiver will verbalize understanding of skin care regimen Date Initiated: 07/18/2020 Target Resolution Date: 08/15/2020 Goal Status: Active Ulcer/skin breakdown will have a volume reduction of 30% by week 4 Date Initiated: 06/11/2020 Date Inactivated: 07/01/2020 Target Resolution Date: 07/09/2020 Goal Status: Met Interventions: Assess patient/caregiver ability to obtain necessary supplies Assess patient/caregiver ability to perform ulcer/skin care regimen upon admission and as needed Assess ulceration(s) every visit Provide education on ulcer and skin care Treatment Activities: Skin care regimen initiated : 06/11/2020 Topical wound management initiated : 06/11/2020 Notes: Electronic Signature(s) Signed: 07/18/2020 5:02:39 PM By: Kela Millin Entered By: Kela Millin on 07/18/2020 09:07:01 -------------------------------------------------------------------------------- Pain Assessment Details Patient Name: Date of Service: [name] 07/18/2020 8:30 A M Medical Record Number: [id number] Patient Account Number: [id number] Date of Birth/Sex: Treating RN: [birth year] (74 y.o. Jerilynn Mages) Carlene Coria Primary Care Jovannie Ulibarri: Nicholes Stairs Other  Clinician: Referring Karyme Mcconathy: Treating Jaliyah Fotheringham/Extender: Dominga Ferry in Treatment: 5 Active Problems Location of Pain Severity and Description of Pain Patient Has Paino No Site Locations Pain Management and Medication Current Pain Management: Electronic Signature(s) Signed: 07/18/2020 4:58:30 PM By: Carlene Coria RN Entered By: Carlene Coria on 07/18/2020 08:59:14 -------------------------------------------------------------------------------- Patient/Caregiver Education Details Patient Name: Date of Service: [name] 7/30/2021andnbsp8:30 A M Medical Record Number: [id number] Patient Account Number: [id number] Date of Birth/Gender: Treating RN: 04-02-1946 (74 y.o. [name] Primary Care Physician: Nicholes Stairs Other Clinician: Referring Physician: Treating Physician/Extender: Dominga Ferry in Treatment: 5 Education Assessment Education Provided To: Patient Education Topics Provided Peripheral Neuropathy: Handouts: Neuropathy Methods: Explain/Verbal Responses: State content correctly Wound/Skin Impairment: Handouts: Caring for Your Ulcer Methods: Explain/Verbal Responses: State content correctly Electronic Signature(s) Signed: 07/18/2020 5:02:39 PM By: Kela Millin Entered By: Kela Millin on 07/18/2020 09:10:03 -------------------------------------------------------------------------------- Wound Assessment Details Patient Name: Date of Service: [name] 07/18/2020 8:30 A M Medical Record Number: [id number] Patient Account Number: [id number] Date of Birth/Sex: Treating RN: May 25, 1946 (74 y.o. Jerilynn Mages) Carlene Coria Primary Care Keyara Ent: Nicholes Stairs Other Clinician: Referring Chauncey Bruno: Treating Clariece Roesler/Extender: Dominga Ferry in Treatment: 5 Wound Status Wound Number: 3 Primary Inflammatory Etiology: Wound Location: Left T Fourth oe Wound  Open Wounding Event: Gradually Appeared Status: Date Acquired: 07/14/2020 Comorbid Glaucoma, Chronic Obstructive Pulmonary Disease (COPD), Sleep Weeks Of Treatment: 0 History: Apnea, Hypertension, Neuropathy, Received Chemotherapy, Clustered Wound: No Received Radiation Wound Measurements Length: (cm) 0.5 Width: (cm) 0.5 Depth: (cm) 0.1 Area: (cm) 0.196 Volume: (cm) 0.02 % Reduction in Area: % Reduction in Volume: Epithelialization: None Tunneling: No Undermining: No Wound Description Classification: Full Thickness Without Exposed Support Structures Exudate Amount: Medium Exudate Type: Serosanguineous Exudate Color: red, brown Foul Odor After Cleansing: No Slough/Fibrino Yes Wound Bed Granulation Amount: Medium (34-66%) Exposed Structure Granulation Quality: Red Fascia Exposed: No Necrotic Amount: Medium (34-66%) Fat Layer (Subcutaneous Tissue) Exposed: Yes Necrotic Quality: Adherent Slough Tendon Exposed: No Muscle Exposed: No Joint Exposed: No Bone Exposed: No Treatment Notes Wound #3 (Left Toe Fourth) 1. Cleanse With Wound Cleanser 2. Periwound Care Skin Prep 3. Primary Dressing Applied [employer] Other primary dressing (specifiy in notes) 4. Secondary Dressing Dry Gauze 5. Secured With Tape Notes moisten collagen with Public relations account executive) Signed: 07/18/2020 4:58:30 PM By: Carlene Coria RN Entered By: Carlene Coria on 07/18/2020 09:01:52 --------------------------------------------------------------------------------  Vitals Details Patient Name: Date of Service: MEKO, MASTERSON RD C. 07/18/2020 8:30 A M Medical Record Number: [id number] Patient Account Number: [id number] Date of Birth/Sex: Treating RN: [birth year] (74 y.o. Jerilynn Mages) Carlene Coria Primary Care Alisabeth Selkirk: Nicholes Stairs Other Clinician: Referring Kelseigh Diver: Treating Ixchel Duck/Extender: Dominga [place] in Treatment: 5 Vital Signs Time Taken: 08:57 Temperature  (F): 98.3 Height (in): 70 Pulse (bpm): 83 Weight (lbs): 214 Respiratory Rate (breaths/min): 18 Body Mass Index (BMI): 30.7 Blood Pressure (mmHg): 133/78 Reference Range: 80 - 120 mg / dl Electronic Signature(s) Signed: 07/18/2020 4:58:30 PM By: Carlene Coria RN Entered By: Carlene Coria on 07/18/2020 08:58:01

## 2020-08-05 ENCOUNTER — Ambulatory Visit (INDEPENDENT_AMBULATORY_CARE_PROVIDER_SITE_OTHER): Payer: Medicare Other | Admitting: Podiatry

## 2020-08-05 ENCOUNTER — Other Ambulatory Visit: Payer: Self-pay

## 2020-08-05 ENCOUNTER — Encounter: Payer: Self-pay | Admitting: Podiatry

## 2020-08-05 DIAGNOSIS — L97511 Non-pressure chronic ulcer of other part of right foot limited to breakdown of skin: Secondary | ICD-10-CM | POA: Diagnosis not present

## 2020-08-05 DIAGNOSIS — L97509 Non-pressure chronic ulcer of other part of unspecified foot with unspecified severity: Secondary | ICD-10-CM | POA: Insufficient documentation

## 2020-08-05 NOTE — Progress Notes (Signed)
This patient returns to my office for at risk foot care.  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 Patient is taking coumadin.  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  Patient had nails trimmed by Dr.  Posey Pronto on 7/19.  He has been treated at the wound center for callus on bottom of his right foot and fourth toe left foot. He presents to the office to discuss his feet and get scheduled for nail care.  His nails are not in need of treatment today.    General Appearance  Alert, conversant and in no acute stress.  Vascular  Dorsalis pedis and posterior tibial  pulses are palpable  bilaterally.  Capillary return is within normal limits  bilaterally. Temperature is within normal limits  bilaterally.  Neurologic  Senn-Weinstein monofilament wire test absent   bilaterally. Muscle power within normal limits bilaterally.  Nails Thick disfigured discolored nails with subungual debris  from hallux to fifth toes bilaterally. No evidence of bacterial infection or drainage bilaterally.  Orthopedic  No limitations of motion  feet .  No crepitus or effusions noted.  No bony pathology or digital deformities noted.  Skin  normotropic skin with no porokeratosis noted bilaterally. Healing ulcers fifth metatarsal head right foot and fourth toe left foot.  S/P Ulcer treatment  Consent was obtained for treatment procedures.   Told him to continue under the care of wound center.  Scheduled for nail treatment in 10 weeks.   Return office visit   10 weeks                   Told patient to return for periodic foot care and evaluation due to potential at risk complications.   Gardiner Barefoot DPM

## 2020-08-07 ENCOUNTER — Other Ambulatory Visit: Payer: Medicare Other | Admitting: Orthotics

## 2020-08-08 ENCOUNTER — Encounter (HOSPITAL_BASED_OUTPATIENT_CLINIC_OR_DEPARTMENT_OTHER): Payer: Medicare Other | Attending: Internal Medicine | Admitting: Internal Medicine

## 2020-08-08 ENCOUNTER — Other Ambulatory Visit: Payer: Self-pay

## 2020-08-08 DIAGNOSIS — L97512 Non-pressure chronic ulcer of other part of right foot with fat layer exposed: Secondary | ICD-10-CM | POA: Diagnosis not present

## 2020-08-08 DIAGNOSIS — I10 Essential (primary) hypertension: Secondary | ICD-10-CM | POA: Diagnosis not present

## 2020-08-08 DIAGNOSIS — G9009 Other idiopathic peripheral autonomic neuropathy: Secondary | ICD-10-CM | POA: Insufficient documentation

## 2020-08-08 DIAGNOSIS — J449 Chronic obstructive pulmonary disease, unspecified: Secondary | ICD-10-CM | POA: Diagnosis not present

## 2020-08-08 DIAGNOSIS — Z9221 Personal history of antineoplastic chemotherapy: Secondary | ICD-10-CM | POA: Insufficient documentation

## 2020-08-08 DIAGNOSIS — Z86718 Personal history of other venous thrombosis and embolism: Secondary | ICD-10-CM | POA: Diagnosis not present

## 2020-08-08 DIAGNOSIS — G629 Polyneuropathy, unspecified: Secondary | ICD-10-CM | POA: Diagnosis not present

## 2020-08-08 DIAGNOSIS — Z85118 Personal history of other malignant neoplasm of bronchus and lung: Secondary | ICD-10-CM | POA: Diagnosis not present

## 2020-08-08 DIAGNOSIS — G4733 Obstructive sleep apnea (adult) (pediatric): Secondary | ICD-10-CM | POA: Diagnosis not present

## 2020-08-11 NOTE — Progress Notes (Signed)
THALES, KNIPPLE ([id number]) Visit Report for 08/08/2020 HPI Details Patient Name: Date of Service: BASIR, NIVEN RD [name] 08/08/2020 12:30 PM Medical Record Number: [id number] Patient Account Number: [id number] Date of Birth/Sex: Treating RN: [birth year] (74 y.o. [name] Primary Care Provider: Nicholes Stairs Other Clinician: Referring Provider: Treating Provider/Extender: Nyra Jabs in Treatment: 8 History of Present Illness HPI Description: ADMISSION 03/28/2020 This is a 74 year old retired man who has a history of neuropathy nondiabetic apparently felt to be secondary to chemotherapy he received this part of treatment for adeno CA of the lung many years ago. Apparently this started about a month ago. He was seen in the walk-in clinic and had cellulitis. Noted to have a draining wound on the plantar aspect of his right foot. He was given Rocephin and treated with doxycycline. And follow-up with primary care culture showed MRSA also resistant to quinolones. He was given doxycycline and clindamycin. When he was seen in follow-up on March 22 his doxycycline was refilled. The wound looked better from an infection point of view. He has finished his doxycycline. He has been using Bactroban to the wound not offloading this at all. He walks around in bare feet in the house. 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 noncompressible and 1.33 on the left also noncompressible 4/16; patient with a nondiabetic idiopathic peripheral neuropathy. Neuropathic wound presented to the clinic last week on the plantar right fifth met head. This became secondarily infected treated by his primary physician and walk-in clinics.  He has not shown any evidence of infection. We gave him a forefoot offloading boot. Fortunately he was able to manage to work with this. He arrives in the clinic today with this wound almost healed. We were using silver collagen 4/23; wound on the plantar aspect of the right fifth metatarsal head. I thought this might be close this week however it is not. We have been using silver alginate 4/30; the wound on the plantar aspect of the fifth metatarsal head in a man with a idiopathic nondiabetic peripheral neuropathy. The area is closed this week. He still has a lot of callus around this wound indicative of pressure/friction when he walks. Readmission: 06/11/2020 upon evaluation today patient presents for reevaluation here in our clinic concerning a reopening of the wound on the lateral portion of his foot which is in the same location where we previously took care of him back in April of this year. Fortunately there does not appear to be any signs of active infection at this time it does appear that he developed a blister that unfortunately opened over the weekend after they called in concerned about this wanting to get in sooner rather than later. With that being said he did see his primary care provider this past Monday that had a culture that has not come back yet. With that being said there does not appear to be any signs of active infection at this time and overall I feel like that is really not a significant issue currently. With regard to the antibiotics his primary care provider did not give him any antibiotics as they did not feel it was infected either. 06/18/2020 upon evaluation today patient appears to be doing fairly well in  regard to his plantar foot ulcer. I am actually very pleased with where things stand and overall feeling the patient is managing quite nicely. With that being said I do not see any signs of active infection at this time. No fevers, chills, nausea, vomiting, or  diarrhea. 7/13; right plantar foot ulcer of the fifth metatarsal head. Once again this is closed. Slight amount of callus but there is nothing open nothing looks ominous. Slight subluxation of the fifth metatarsal head at about a distance equal to the first. They have an appointment with podiatry next Monday. 07/18/2020 on evaluation today patient appears to be doing a little more poorly in regard to a wound on his left fourth toe. This appears to be a blister that is likely secondary to friction in my opinion. There does not appear to be any signs of infection at this time which is good news. With that being said I feel like that he has made excellent progress in regard to the original wound that I saw him for although Dr. Dellia Nims did heal everything out when I was on vacation and he saw the patient. That is excellent news. Nonetheless in regard to his fourth toe at this point I think that we need to go ahead and try to do what we can to protect this from worsening. I think there are definitely some recommendations we can make here today. 8/20; the patient's area on the right fifth met head is healed. He went to see podiatry there apparently making him a modified shoe insole for this but is not ready yet. They identified a wound last time on the left fourth toe dorsal aspect this is also closed Electronic Signature(s) Signed: 08/11/2020 7:15:26 AM By: Linton Ham MD Entered By: Linton Ham on 08/08/2020 13:27:05 -------------------------------------------------------------------------------- Physical Exam Details Patient Name: Date of Service: [name] 08/08/2020 12:30 PM Medical Record Number: [id number] Patient Account Number: [id number] Date of Birth/Sex: Treating RN: 08-Oct-1946 (74 y.o. [name] Primary Care Provider: Nicholes Stairs Other Clinician: Referring Provider: Treating Provider/Extender: Nyra Jabs in Treatment:  8 Constitutional Patient is hypertensive.. Pulse regular and within target range for patient.Marland Kitchen Respirations regular, non-labored and within target range.. Temperature is normal and within the target range for the patient.Marland Kitchen Appears in no distress. Cardiovascular Pedal pulses are palpable on the right. Notes Wound exam; right fifth plantar metatarsal head is closed. I think this is a slightly subluxed area and is likely going to need attention in his shoes for the rest of his life. The area on the left dorsal fourth toe is also closed. Electronic Signature(s) Signed: 08/11/2020 7:15:26 AM By: Linton Ham MD Entered By: Linton Ham on 08/08/2020 13:28:00 -------------------------------------------------------------------------------- Physician Orders Details Patient Name: Date of Service: [name] 08/08/2020 12:30 PM Medical Record Number: [id number] Patient Account Number: [id number] Date of Birth/Sex: Treating RN: November 14, 1946 (74 y.o. [name] Primary Care Provider: Nicholes Stairs Other Clinician: Referring Provider: Treating Provider/Extender: Nyra Jabs in Treatment: 8 Verbal / Phone Orders: No Diagnosis Coding ICD-10 Coding Code Description G90.09 Other idiopathic peripheral autonomic neuropathy L97.512 Non-pressure chronic ulcer of other part of right foot with fat layer exposed I10 Essential (primary) hypertension Discharge From [facility] Discharge from [place] - call if wound re-opens. can buy toe inserts for feet and continue to pad/cushion toe Electronic Signature(s) Signed: 08/08/2020 4:56:28 PM By: Kela Millin Signed: 08/11/2020 7:15:26 AM By: Linton Ham MD  Entered By: Kela Millin on 08/08/2020 13:22:50 -------------------------------------------------------------------------------- Problem List Details Patient Name: Date of Service: TORBEN, SOLOWAY 08/08/2020 12:30 PM Medical Record  Number: [id number] Patient Account Number: [id number] Date of Birth/Sex: Treating RN: 1946/09/21 (74 y.o. [name] Primary Care Provider: Nicholes Stairs Other Clinician: Referring Provider: Treating Provider/Extender: Nyra Jabs in Treatment: 8 Active Problems ICD-10 Encounter Code Description Active Date MDM Diagnosis G90.09 Other idiopathic peripheral autonomic neuropathy 06/11/2020 No Yes L97.512 Non-pressure chronic ulcer of other part of right foot with fat layer exposed 06/11/2020 No Yes I10 Essential (primary) hypertension 06/11/2020 No Yes Inactive Problems Resolved Problems Electronic Signature(s) Signed: 08/11/2020 7:15:26 AM By: Linton Ham MD Entered By: Linton Ham on 08/08/2020 13:26:12 -------------------------------------------------------------------------------- Progress Note Details Patient Name: Date of Service: [name] 08/08/2020 12:30 PM Medical Record Number: [id number] Patient Account Number: [id number] Date of Birth/Sex: Treating RN: Jan 12, 1946 (74 y.o. [name] Primary Care Provider: Nicholes Stairs Other Clinician: Referring Provider: Treating Provider/Extender: Nyra Jabs in Treatment: 8 Subjective History of Present Illness (HPI) ADMISSION 03/28/2020 This is a 74 year old retired man who has a history of neuropathy nondiabetic apparently felt to be secondary to chemotherapy he received this part of treatment for adeno CA of the lung many years ago. Apparently this started about a month ago. He was seen in the walk-in clinic and had cellulitis. Noted to have a draining wound on the plantar aspect of his right foot. He was given Rocephin and treated with doxycycline. And follow-up with primary care culture showed MRSA also resistant to quinolones. He was given doxycycline and clindamycin. When he was seen in follow-up on March 22 his doxycycline was refilled. The  wound looked better from an infection point of view. He has finished his doxycycline. He has been using Bactroban to the wound not offloading this at all. He walks around in bare feet in the house. 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 noncompressible and 1.33 on the left also noncompressible 4/16; patient with a nondiabetic idiopathic peripheral neuropathy. Neuropathic wound presented to the clinic last week on the plantar right fifth met head. This became secondarily infected treated by his primary physician and walk-in clinics. He has not shown any evidence of infection. We gave him a forefoot offloading boot. Fortunately he was able to manage to work with this. He arrives in the clinic today with this wound almost healed. We were using silver collagen 4/23; wound on the plantar aspect of the right fifth metatarsal head. I thought this might be close this week however it is not. We have been using silver alginate 4/30; the wound on the plantar aspect of the fifth metatarsal head in a man with a idiopathic nondiabetic peripheral neuropathy. The area is closed this week. He still has a lot of callus around this wound indicative of pressure/friction when he walks. Readmission: 06/11/2020 upon evaluation today patient presents for reevaluation here in our clinic concerning a reopening of the wound on the lateral portion of his foot which is in the same location where we previously took care of him back in April of this year. Fortunately there does not appear to be any signs of active infection at  this time it does appear that he developed a blister that unfortunately opened over the weekend after they called in concerned about this wanting  to get in sooner rather than later. With that being said he did see his primary care provider this past Monday that had a culture that has not come back yet. With that being said there does not appear to be any signs of active infection at this time and overall I feel like that is really not a significant issue currently. With regard to the antibiotics his primary care provider did not give him any antibiotics as they did not feel it was infected either. 06/18/2020 upon evaluation today patient appears to be doing fairly well in regard to his plantar foot ulcer. I am actually very pleased with where things stand and overall feeling the patient is managing quite nicely. With that being said I do not see any signs of active infection at this time. No fevers, chills, nausea, vomiting, or diarrhea. 7/13; right plantar foot ulcer of the fifth metatarsal head. Once again this is closed. Slight amount of callus but there is nothing open nothing looks ominous. Slight subluxation of the fifth metatarsal head at about a distance equal to the first. They have an appointment with podiatry next Monday. 07/18/2020 on evaluation today patient appears to be doing a little more poorly in regard to a wound on his left fourth toe. This appears to be a blister that is likely secondary to friction in my opinion. There does not appear to be any signs of infection at this time which is good news. With that being said I feel like that he has made excellent progress in regard to the original wound that I saw him for although Dr. Dellia Nims did heal everything out when I was on vacation and he saw the patient. That is excellent news. Nonetheless in regard to his fourth toe at this point I think that we need to go ahead and try to do what we can to protect this from worsening. I think there are definitely some recommendations we can make here today. 8/20; the patient's area on the right fifth met head is healed. He went to see  podiatry there apparently making him a modified shoe insole for this but is not ready yet. They identified a wound last time on the left fourth toe dorsal aspect this is also closed Objective Constitutional Patient is hypertensive.. Pulse regular and within target range for patient.Marland Kitchen Respirations regular, non-labored and within target range.. Temperature is normal and within the target range for the patient.Marland Kitchen Appears in no distress. Vitals Time Taken: 1:02 PM, Height: 70 in, Weight: 214 lbs, BMI: 30.7, Temperature: 97.8 F, Pulse: 86 bpm, Respiratory Rate: 18 breaths/min, Blood Pressure: 152/89 mmHg. Cardiovascular Pedal pulses are palpable on the right. General Notes: Wound exam; right fifth plantar metatarsal head is closed. I think this is a slightly subluxed area and is likely going to need attention in his shoes for the rest of his life. The area on the left dorsal fourth toe is also closed. Integumentary (Hair, Skin) Wound #3 status is Healed - Epithelialized. Original cause of wound was Gradually Appeared. The wound is located on the Left T Fourth. The wound measures oe 0cm length x 0cm width x 0cm depth; 0cm^2 area and 0cm^3 volume. There is no tunneling or undermining noted. There is a none present amount of drainage noted. There is no granulation within the wound bed. There is no necrotic tissue  within the wound bed. Assessment Active Problems ICD-10 Other idiopathic peripheral autonomic neuropathy Non-pressure chronic ulcer of other part of right foot with fat layer exposed Essential (primary) hypertension Plan Discharge From [facility]: Discharge from [facility] - call if wound re-opens. can buy toe inserts for feet and continue to pad/cushion toe 1. The patient can be discharged from the clinic 2. I advised him to keep the right fifth metatarsal head padded. 3. Also advised keeping the fourth and fifth toes away from each other, of the fourth toe lateral aspect is  indented by the fifth toes bilaterally. 4. He will follow with podiatry in follow-up for his custom made inserts Electronic Signature(s) Signed: 08/11/2020 7:15:26 AM By: Linton Ham MD Entered By: Linton Ham on 08/08/2020 13:28:45 -------------------------------------------------------------------------------- SuperBill Details Patient Name: Date of Service: [name] [place]. 08/08/2020 Medical Record Number: [id number] Patient Account Number: [id number] Date of Birth/Sex: Treating RN: 1946/03/18 (74 y.o. [name] Primary Care Provider: Nicholes Stairs Other Clinician: Referring Provider: Treating Provider/Extender: Nyra Jabs in Treatment: 8 Diagnosis Coding ICD-10 Codes Code Description G90.09 Other idiopathic peripheral autonomic neuropathy L97.512 Non-pressure chronic ulcer of other part of right foot with fat layer exposed I10 Essential (primary) hypertension Facility Procedures CPT4 Code: [id number] Description: 99213 - WOUND CARE VISIT-LEV 3 EST PT Modifier: Quantity: 1 Physician Procedures : CPT4 Code Description Modifier [id number] - WC PHYS LEVEL 3 - EST PT ICD-10 Diagnosis Description G90.09 Other idiopathic peripheral autonomic neuropathy L97.512 Non-pressure chronic ulcer of other part of right foot with fat layer exposed Quantity: 1 Electronic Signature(s) Signed: 08/11/2020 7:15:26 AM By: Linton Ham MD Entered By: Linton Ham on 08/08/2020 13:29:02

## 2020-08-16 NOTE — Progress Notes (Signed)
[name], [name] ([id number]) Visit Report for 08/08/2020 Arrival Information Details Patient Name: Date of Service: [name], [name] [place]. 08/08/2020 12:30 PM Medical Record Number: [id number] Patient Account Number: [id number] Date of Birth/Sex: Treating RN: [birth year] (74 y.o. Jerilynn Mages) Carlene Coria Primary Care Setsuko Robins: Nicholes Stairs Other Clinician: Referring Phoebe Marter: Treating Larren Copes/Extender: Nyra Jabs in Treatment: 8 Visit Information History Since Last Visit All ordered tests and consults were completed: No Patient Arrived: Ambulatory Added or deleted any medications: No Arrival Time: 12:59 Any new allergies or adverse reactions: No Accompanied By: wife Had a fall or experienced change in No Transfer Assistance: None activities of daily living that may affect Patient Identification Verified: Yes risk of falls: Secondary Verification Process Completed: Yes Signs or symptoms of abuse/neglect since last visito No Patient Requires Transmission-Based Precautions: No Has Dressing in Place as Prescribed: Yes Patient Has Alerts: Yes Has Compression in Place as Prescribed: Yes Patient Alerts: Patient on Blood Thinner Pain Present Now: No Electronic Signature(s) Signed: 08/15/2020 5:50:16 PM By: Carlene Coria RN Entered By: Carlene Coria on 08/08/2020 13:01:41 -------------------------------------------------------------------------------- Clinic Level of Care Assessment Details Patient Name: Date of Service: [name], [name] 08/08/2020 12:30 PM Medical Record Number: [id number] Patient Account Number: [id number] Date of Birth/Sex: Treating RN: July 05, 1946 (74 y.o. [name] Primary Care Aja Whitehair: Nicholes Stairs Other Clinician: Referring Diane Hanel: Treating Belinda Bringhurst/Extender: Nyra Jabs in Treatment: 8 Clinic Level of Care Assessment Items TOOL 4 Quantity Score X- 1 0 Use when only an EandM is performed on FOLLOW-UP  visit ASSESSMENTS - Nursing Assessment / Reassessment X- 1 10 Reassessment of Co-morbidities (includes updates in patient status) X- 1 5 Reassessment of Adherence to Treatment Plan ASSESSMENTS - Wound and Skin A ssessment / Reassessment X - Simple Wound Assessment / Reassessment - one wound 1 5 []  - 0 Complex Wound Assessment / Reassessment - multiple wounds []  - 0 Dermatologic / Skin Assessment (not related to wound area) ASSESSMENTS - Focused Assessment X- 1 5 Circumferential Edema Measurements - multi extremities []  - 0 Nutritional Assessment / Counseling / Intervention []  - 0 Lower Extremity Assessment (monofilament, tuning fork, pulses) []  - 0 Peripheral Arterial Disease Assessment (using hand held doppler) ASSESSMENTS - Ostomy and/or Continence Assessment and Care []  - 0 Incontinence Assessment and Management []  - 0 Ostomy Care Assessment and Management (repouching, etc.) PROCESS - Coordination of Care X - Simple Patient / Family Education for ongoing care 1 15 []  - 0 Complex (extensive) Patient / Family Education for ongoing care X- 1 10 Staff obtains Programmer, systems, Records, T Results / Process Orders est []  - 0 Staff telephones HHA, Nursing Homes / Clarify orders / etc []  - 0 Routine Transfer to another Facility (non-emergent condition) []  - 0 Routine Hospital Admission (non-emergent condition) []  - 0 New Admissions / Biomedical engineer / Ordering NPWT Apligraf, etc. , []  - 0 Emergency Hospital Admission (emergent condition) X- 1 10 Simple Discharge Coordination []  - 0 Complex (extensive) Discharge Coordination PROCESS - Special Needs []  - 0 Pediatric / Minor Patient Management []  - 0 Isolation Patient Management []  - 0 Hearing / Language / Visual special needs []  - 0 Assessment of Community assistance (transportation, D/C planning, etc.) []  - 0 Additional assistance / Altered mentation []  - 0 Support Surface(s) Assessment (bed, cushion, seat,  etc.) INTERVENTIONS - Wound Cleansing / Measurement X - Simple Wound Cleansing - one wound 1 5 []  - 0 Complex Wound Cleansing - multiple wounds  X- 1 5 Wound Imaging (photographs - any number of wounds) []  - 0 Wound Tracing (instead of photographs) X- 1 5 Simple Wound Measurement - one wound []  - 0 Complex Wound Measurement - multiple wounds INTERVENTIONS - Wound Dressings []  - 0 Small Wound Dressing one or multiple wounds []  - 0 Medium Wound Dressing one or multiple wounds []  - 0 Large Wound Dressing one or multiple wounds []  - 0 Application of Medications - topical []  - 0 Application of Medications - injection INTERVENTIONS - Miscellaneous []  - 0 External ear exam []  - 0 Specimen Collection (cultures, biopsies, blood, body fluids, etc.) []  - 0 Specimen(s) / Culture(s) sent or taken to Lab for analysis []  - 0 Patient Transfer (multiple staff / Civil Service fast streamer / Similar devices) []  - 0 Simple Staple / Suture removal (25 or less) []  - 0 Complex Staple / Suture removal (26 or more) []  - 0 Hypo / Hyperglycemic Management (close monitor of Blood Glucose) []  - 0 Ankle / Brachial Index (ABI) - do not check if billed separately X- 1 5 Vital Signs Has the patient been seen at the hospital within the last three years: Yes Total Score: 80 Level Of Care: New/Established - Level 3 Electronic Signature(s) Signed: 08/08/2020 4:56:28 PM By: Kela Millin Entered By: Kela Millin on 08/08/2020 13:21:37 -------------------------------------------------------------------------------- Encounter Discharge Information Details Patient Name: Date of Service: [name] Caleb C. 08/08/2020 12:30 PM Medical Record Number: [id number] Patient Account Number: [id number] Date of Birth/Sex: Treating RN: [birth year] (74 y.o. [name] Primary Care Josselin Gaulin: Nicholes Stairs Other Clinician: Referring Arieon Scalzo: Treating Lexis Potenza/Extender: Nyra Jabs in  Treatment: 8 Encounter Discharge Information Items Discharge Condition: Stable Ambulatory Status: Ambulatory Discharge Destination: Home Transportation: Private Auto Accompanied By: wife Schedule Follow-up Appointment: No Clinical Summary of Care: Electronic Signature(s) Signed: 08/08/2020 5:19:06 PM By: Kela Millin Entered By: Kela Millin on 08/08/2020 17:17:20 -------------------------------------------------------------------------------- Lower Extremity Assessment Details Patient Name: Date of Service: BINH, DOTEN Caleb C. 08/08/2020 12:30 PM Medical Record Number: [id number] Patient Account Number: [id number] Date of Birth/Sex: Treating RN: 19-Aug-1946 (74 y.o. Jerilynn Mages) Carlene Coria Primary Care Elisse Pennick: Nicholes Stairs Other Clinician: Referring Keary Hanak: Treating Bradan Congrove/Extender: Nyra Jabs in Treatment: 8 Edema Assessment Assessed: Shirlyn Goltz: No] [Right: No] Edema: [Left: N] [Right: o] Calf Left: Right: Point of Measurement: cm From Medial Instep cm 40 cm Ankle Left: Right: Point of Measurement: cm From Medial Instep cm 23 cm Electronic Signature(s) Signed: 08/15/2020 5:50:16 PM By: Carlene Coria RN Entered By: Carlene Coria on 08/08/2020 13:03:37 -------------------------------------------------------------------------------- Multi Wound Chart Details Patient Name: Date of Service: [name] Caleb C. 08/08/2020 12:30 PM Medical Record Number: [id number] Patient Account Number: [id number] Date of Birth/Sex: Treating RN: 1946/11/29 (74 y.o. [name] Primary Care Jersie Beel: Nicholes Stairs Other Clinician: Referring Selma Rodelo: Treating Keslyn Teater/Extender: Nyra Jabs in Treatment: 8 Vital Signs Height(in): 70 Pulse(bpm): 71 Weight(lbs): 214 Blood Pressure(mmHg): 152/89 Body Mass Index(BMI): 31 Temperature(F): 97.8 Respiratory Rate(breaths/min): 18 Photos: [3:No Photos Left T Fourth oe] [N/A:N/A  N/A] Wound Location: [3:Gradually Appeared] [N/A:N/A] Wounding Event: [3:Inflammatory] [N/A:N/A] Primary Etiology: [3:Glaucoma, Chronic Obstructive] [N/A:N/A] Comorbid History: [3:Pulmonary Disease (COPD), Sleep Apnea, Hypertension, Neuropathy, Received Chemotherapy, Received Radiation 07/14/2020] [N/A:N/A] Date Acquired: [3:3] [N/A:N/A] Weeks of Treatment: [3:Healed - Epithelialized] [N/A:N/A] Wound Status: [3:0x0x0] [N/A:N/A] Measurements L x W x D (cm) [3:0] [N/A:N/A] A (cm) : rea [3:0] [N/A:N/A] Volume (cm) : [3:100.00%] [N/A:N/A] % Reduction in Area: [3:100.00%] [N/A:N/A] % Reduction  in Volume: [3:Full Thickness Without Exposed] [N/A:N/A] Classification: [3:Support Structures None Present] [N/A:N/A] Exudate Amount: [3:None Present (0%)] [N/A:N/A] Granulation Amount: [3:None Present (0%)] [N/A:N/A] Necrotic Amount: [3:Fascia: No] [N/A:N/A] Exposed Structures: [3:Fat Layer (Subcutaneous Tissue): No Tendon: No Muscle: No Joint: No Bone: No None] [N/A:N/A] Treatment Notes Electronic Signature(s) Signed: 08/08/2020 4:56:28 PM By: Kela Millin Signed: 08/11/2020 7:15:26 AM By: Linton Ham MD Entered By: Linton Ham on 08/08/2020 13:26:17 -------------------------------------------------------------------------------- Multi-Disciplinary Care Plan Details Patient Name: Date of Service: [name] Caleb C. 08/08/2020 12:30 PM Medical Record Number: [id number] Patient Account Number: [id number] Date of Birth/Sex: Treating RN: 1946/02/05 (74 y.o. [name] Primary Care Cathleen Yagi: Nicholes Stairs Other Clinician: Referring Syvilla Martin: Treating Nilan Iddings/Extender: Nyra Jabs in Treatment: 8 Active Inactive Electronic Signature(s) Signed: 08/08/2020 4:56:28 PM By: Kela Millin Entered By: Kela Millin on 08/08/2020 13:23:20 -------------------------------------------------------------------------------- Pain Assessment  Details Patient Name: Date of Service: [name] Caleb C. 08/08/2020 12:30 PM Medical Record Number: [id number] Patient Account Number: [id number] Date of Birth/Sex: Treating RN: 1946-09-25 (74 y.o. Oval Linsey Primary Care Veronica Guerrant: Nicholes Stairs Other Clinician: Referring Kohana Amble: Treating Jhanvi Drakeford/Extender: Nyra Jabs in Treatment: 8 Active Problems Location of Pain Severity and Description of Pain Patient Has Paino No Site Locations Pain Management and Medication Current Pain Management: Electronic Signature(s) Signed: 08/15/2020 5:50:16 PM By: Carlene Coria RN Entered By: Carlene Coria on 08/08/2020 13:03:31 -------------------------------------------------------------------------------- Patient/Caregiver Education Details Patient Name: Date of Service: [name] Caleb C. 8/20/2021andnbsp12:30 PM Medical Record Number: [id number] Patient Account Number: [id number] Date of Birth/Gender: Treating RN: November 07, 1946 (74 y.o. [name] Primary Care Physician: Nicholes Stairs Other Clinician: Referring Physician: Treating Physician/Extender: Nyra Jabs in Treatment: 8 Education Assessment Education Provided To: Patient Education Topics Provided Wound/Skin Impairment: Handouts: Caring for Your Ulcer Methods: Explain/Verbal Responses: State content correctly Electronic Signature(s) Signed: 08/08/2020 4:56:28 PM By: Kela Millin Entered By: Kela Millin on 08/08/2020 13:04:53 -------------------------------------------------------------------------------- Wound Assessment Details Patient Name: Date of Service: [name] Caleb C. 08/08/2020 12:30 PM Medical Record Number: [id number] Patient Account Number: [id number] Date of Birth/Sex: Treating RN: [birth year] (74 y.o. Jerilynn Mages) Carlene Coria Primary Care Naudia Crosley: Nicholes Stairs Other Clinician: Referring Dayanis Bergquist: Treating Adrean Heitz/Extender: Nyra Jabs in Treatment: 8 Wound Status Wound Number: 3 Primary Inflammatory Etiology: Wound Location: Left T Fourth oe Wound Healed - Epithelialized Wounding Event: Gradually Appeared Status: Date Acquired: 07/14/2020 Comorbid Glaucoma, Chronic Obstructive Pulmonary Disease (COPD), Sleep Weeks Of Treatment: 3 History: Apnea, Hypertension, Neuropathy, Received Chemotherapy, Clustered Wound: No Received Radiation Photos Photo Uploaded By: Mikeal Hawthorne on 08/11/2020 11:55:18 Wound Measurements Length: (cm) Width: (cm) Depth: (cm) Area: (cm) Volume: (cm) 0 % Reduction in Area: 100% 0 % Reduction in Volume: 100% 0 Epithelialization: None 0 Tunneling: No 0 Undermining: No Wound Description Classification: Full Thickness Without Exposed Support Structures Exudate Amount: None Present Foul Odor After Cleansing: No Slough/Fibrino No Wound Bed Granulation Amount: None Present (0%) Exposed Structure Necrotic Amount: None Present (0%) Fascia Exposed: No Fat Layer (Subcutaneous Tissue) Exposed: No Tendon Exposed: No Muscle Exposed: No Joint Exposed: No Bone Exposed: No Electronic Signature(s) Signed: 08/08/2020 4:56:28 PM By: Kela Millin Signed: 08/15/2020 5:50:16 PM By: Carlene Coria RN Entered By: Kela Millin on 08/08/2020 13:22:08 -------------------------------------------------------------------------------- [place] Details Patient Name: Date of Service: [name] Caleb C. 08/08/2020 12:30 PM Medical Record Number: [id number] Patient Account Number: [id number] Date of Birth/Sex: Treating RN: 1946/07/01 (74 y.o. Jerilynn Mages) Dolores Lory, [place] Primary  Care Sudais Banghart: Nicholes Stairs Other Clinician: Referring Montey Ebel: Treating Jahliyah Trice/Extender: Nyra Jabs in Treatment: 8 Vital Signs Time Taken: 13:02 Temperature (F): 97.8 Height (in): 70 Pulse (bpm): 86 Weight (lbs): 214 Respiratory Rate (breaths/min): 18 Body Mass  Index (BMI): 30.7 Blood Pressure (mmHg): 152/89 Reference Range: 80 - 120 mg / dl Electronic Signature(s) Signed: 08/15/2020 5:50:16 PM By: Carlene Coria RN Entered By: Carlene Coria on 08/08/2020 13:03:18

## 2020-08-18 ENCOUNTER — Ambulatory Visit: Payer: Medicare Other | Admitting: Podiatry

## 2020-08-26 ENCOUNTER — Other Ambulatory Visit: Payer: Self-pay

## 2020-08-26 ENCOUNTER — Ambulatory Visit: Payer: Medicare Other | Admitting: Orthotics

## 2020-08-26 DIAGNOSIS — D689 Coagulation defect, unspecified: Secondary | ICD-10-CM

## 2020-08-26 DIAGNOSIS — M21621 Bunionette of right foot: Secondary | ICD-10-CM

## 2020-08-26 DIAGNOSIS — L97511 Non-pressure chronic ulcer of other part of right foot limited to breakdown of skin: Secondary | ICD-10-CM

## 2020-08-26 NOTE — Progress Notes (Signed)
Patient came in today to p/up custom foot orthotics; patient wore crocs in and was advised they didn't do well with CMFO; advised to get shoe with deep and wide toebox, e.g., NB or Brooks.

## 2020-09-12 ENCOUNTER — Ambulatory Visit (INDEPENDENT_AMBULATORY_CARE_PROVIDER_SITE_OTHER): Payer: Medicare Other | Admitting: Podiatry

## 2020-09-12 ENCOUNTER — Other Ambulatory Visit: Payer: Self-pay

## 2020-09-12 DIAGNOSIS — L97511 Non-pressure chronic ulcer of other part of right foot limited to breakdown of skin: Secondary | ICD-10-CM | POA: Diagnosis not present

## 2020-09-12 DIAGNOSIS — M21621 Bunionette of right foot: Secondary | ICD-10-CM | POA: Diagnosis not present

## 2020-09-16 ENCOUNTER — Encounter: Payer: Self-pay | Admitting: Podiatry

## 2020-09-16 NOTE — Progress Notes (Signed)
Subjective:  Patient ID: [name], male    DOB: [birth year],  MRN: [id number]  No chief complaint on file.   74 y.o. male presents with the above complaint.  Patient presents with a follow-up of right submetatarsal 5 hyperkeratotic lesion.  Patient is doing well.  The orthotics are functioning well.  He is able to return to normal activities without any restrictions.  Review of Systems: Negative except as noted in the HPI. Denies N/V/F/Ch.  Past Medical History:  Diagnosis Date  . Adenocarcinoma of right lung, stage 1 ([place]) 03/03/2017  . Cancer ([place])    lung cancer 2005  . Cancer ([place])    skin cancer, mouth cancer  . COPD (chronic obstructive pulmonary disease) ([place])   . Glaucoma   . Hereditary and idiopathic peripheral neuropathy 09/26/2014  . Hyperlipidemia   . Hypertension   . Ischemic colitis ([place])   . 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 Polio   . Sleep apnea    no CPAP    Current Outpatient Medications:  .  carvedilol (COREG) 3.125 MG tablet, Take 3.125 mg by mouth 2 (two) times daily with a meal., Disp: , Rfl:  .  latanoprost (XALATAN) 0.005 % ophthalmic solution, Place 1 drop into both eyes at bedtime., Disp: , Rfl:  .  losartan (COZAAR) 50 MG tablet, Take 50 mg by mouth daily., Disp: , Rfl:  .  Multiple Vitamin (MULTIVITAMIN WITH MINERALS) TABS tablet, Take 1 tablet by mouth daily., Disp: , Rfl:  .  Tiotropium Bromide Monohydrate (SPIRIVA HANDIHALER IN), Inhale 1 Inhaler into the lungs., Disp: , Rfl:  .  warfarin (COUMADIN) 5 MG tablet, Take 1 tablet (5 mg total) by mouth as directed. (Patient taking differently: Take 5-7.5 mg by mouth See admin instructions. Monday, Wednesday, and Friday 7.5 mg (1.5 tablets) & 5 mg (1 tablet) all other days.), Disp: 125 tablet, Rfl: 4  Social History   Tobacco Use  Smoking Status Former Smoker  . Packs/day: 2.00  . Years: 40.00  . Pack years: 80.00  . Types: Cigarettes  . Quit date: 2004  . Years since quitting: 17.7    Smokeless Tobacco Never Used    Allergies  Allergen Reactions  . No Known Allergies    Objective:  There were no vitals filed for this visit. There is no height or weight on file to calculate BMI. Constitutional Well developed. Well nourished.  Vascular Dorsalis pedis pulses palpable bilaterally. Posterior tibial pulses palpable bilaterally. Capillary refill normal to all digits.  No cyanosis or clubbing noted. Pedal hair growth normal.  Neurologic Normal speech. Oriented to person, place, and time. Epicritic sensation to light touch grossly present bilaterally.  Dermatologic Nails well groomed and normal in appearance. No open wounds. No skin lesions.  Orthopedic:  No pain on palpation of the fifth metatarsophalangeal joint at the site of submetatarsal 5 with hyperkeratotic lesion.  Now pain on palpation to the lesion.  No pain with range of motion of the fifth metatarsophalangeal joint.  Clinically able to palpate the tailor's bunion deformity.  Appears to be reducible in nature.   Radiographs: None Assessment:   1. Tailor's bunion of right foot   2. Ulcer of right foot, limited to breakdown of skin [facility])    Plan:  Patient was evaluated and treated and all questions answered.  Right submetatarsal 5 preulcerative lesion with underlying pes planovalgus -Clinically completely healed.  Orthotics are functioning well.  At this time patient is officially discharged from  my care.  If any foot and ankle issues arise in the future I have asked the patient to come back and see me.  Patient states understanding -Patient has been utilizing custom orthotics which seems to have helped a lot.  Good correction alignment noted when ambulating with orthotics.  No follow-ups on file.

## 2020-09-23 ENCOUNTER — Inpatient Hospital Stay: Payer: Medicare Other | Attending: Internal Medicine

## 2020-09-23 ENCOUNTER — Ambulatory Visit (HOSPITAL_COMMUNITY)
Admission: RE | Admit: 2020-09-23 | Discharge: 2020-09-23 | Disposition: A | Discharge status: Home / Self Care | Payer: Medicare Other | Source: Ambulatory Visit | Attending: Internal Medicine | Admitting: Internal Medicine

## 2020-09-23 ENCOUNTER — Encounter (HOSPITAL_COMMUNITY): Payer: Self-pay

## 2020-09-23 ENCOUNTER — Other Ambulatory Visit: Payer: Self-pay

## 2020-09-23 DIAGNOSIS — C3490 Malignant neoplasm of unspecified part of unspecified bronchus or lung: Secondary | ICD-10-CM | POA: Diagnosis present

## 2020-09-23 DIAGNOSIS — C3431 Malignant neoplasm of lower lobe, right bronchus or lung: Secondary | ICD-10-CM | POA: Diagnosis not present

## 2020-09-23 DIAGNOSIS — I2510 Atherosclerotic heart disease of native coronary artery without angina pectoris: Secondary | ICD-10-CM | POA: Insufficient documentation

## 2020-09-23 LAB — CMP (CANCER CENTER ONLY)
ALT: 24 U/L (ref 0–44)
AST: 25 U/L (ref 15–41)
Albumin: 3.9 g/dL (ref 3.5–5.0)
Alkaline Phosphatase: 99 U/L (ref 38–126)
Anion gap: 7 (ref 5–15)
BUN: 8 mg/dL (ref 8–23)
CO2: 28 mmol/L (ref 22–32)
Calcium: 9.6 mg/dL (ref 8.9–10.3)
Chloride: 102 mmol/L (ref 98–111)
Creatinine: 0.74 mg/dL (ref 0.61–1.24)
GFR, Estimated: >60 mL/min (ref >60)
Glucose, Bld: 104 mg/dL — ABNORMAL HIGH (ref 70–99)
Potassium: 4.4 mmol/L (ref 3.5–5.1)
Sodium: 137 mmol/L (ref 135–145)
Total Bilirubin: 0.6 mg/dL (ref 0.3–1.2)
Total Protein: 7.3 g/dL (ref 6.5–8.1)

## 2020-09-23 LAB — CBC WITH DIFFERENTIAL (CANCER CENTER ONLY)
Abs Immature Granulocytes: 0.03 10*3/uL (ref 0.00–0.07)
Basophils Absolute: 0.1 10*3/uL (ref 0.0–0.1)
Basophils Relative: 1 %
Eosinophils Absolute: 0.2 10*3/uL (ref 0.0–0.5)
Eosinophils Relative: 2 %
HCT: 46.6 % (ref 39.0–52.0)
Hemoglobin: 15.7 g/dL (ref 13.0–17.0)
Immature Granulocytes: 0 %
Lymphocytes Relative: 21 %
Lymphs Abs: 1.7 10*3/uL (ref 0.7–4.0)
MCH: 31.6 pg (ref 26.0–34.0)
MCHC: 33.7 g/dL (ref 30.0–36.0)
MCV: 93.8 fL (ref 80.0–100.0)
Monocytes Absolute: 1 10*3/uL (ref 0.1–1.0)
Monocytes Relative: 12 %
Neutro Abs: 5.2 10*3/uL (ref 1.7–7.7)
Neutrophils Relative %: 64 %
Platelet Count: 223 10*3/uL (ref 150–400)
RBC: 4.97 MIL/uL (ref 4.22–5.81)
RDW: 14.1 % (ref 11.5–15.5)
WBC Count: 8.3 10*3/uL (ref 4.0–10.5)
nRBC: 0 % (ref 0.0–0.2)

## 2020-09-23 MED ORDER — IOHEXOL 300 MG/ML  SOLN
75.0000 mL | Freq: Once | INTRAMUSCULAR | Status: AC | PRN
  Administered 2020-09-23: 75 mL via INTRAVENOUS

## 2020-09-25 ENCOUNTER — Inpatient Hospital Stay (HOSPITAL_BASED_OUTPATIENT_CLINIC_OR_DEPARTMENT_OTHER): Payer: Medicare Other | Admitting: Internal Medicine

## 2020-09-25 ENCOUNTER — Encounter: Payer: Self-pay | Admitting: Internal Medicine

## 2020-09-25 ENCOUNTER — Other Ambulatory Visit: Payer: Self-pay

## 2020-09-25 VITALS — BP 130/78 | HR 83 | Temp 97.1°F | Resp 18 | Ht 69.0 in | Wt 214.6 lb

## 2020-09-25 DIAGNOSIS — C3491 Malignant neoplasm of unspecified part of right bronchus or lung: Secondary | ICD-10-CM

## 2020-09-25 DIAGNOSIS — C3490 Malignant neoplasm of unspecified part of unspecified bronchus or lung: Secondary | ICD-10-CM | POA: Diagnosis not present

## 2020-09-25 DIAGNOSIS — C3431 Malignant neoplasm of lower lobe, right bronchus or lung: Secondary | ICD-10-CM | POA: Diagnosis not present

## 2020-09-25 NOTE — Progress Notes (Signed)
[facility] [phone]   [phone]  OFFICE PROGRESS NOTE  Hulan Fess, MD [place] [address]  DIAGNOSIS: Stage IA (T1a, N0, M0) non-small cell lung cancer, adenocarcinoma diagnosed in January 2018  PRIOR THERAPY: Status post right lower lobe superior segmentectomy with lymph node dissection under the care of Dr. Roxan Hockey.  CURRENT THERAPY: Observation.  INTERVAL HISTORY: [name] 74 y.o. male returns to the clinic today for annual follow-up visit accompanied by his wife.  The patient is feeling fine today with no concerning complaints except for shortness of breath with exertion.  He denied having any chest pain, cough or hemoptysis.  He denied having any fever or chills.  He has no nausea, vomiting, diarrhea or constipation.  He denied having any headache or visual changes.  He is here today for evaluation with repeat CT scan of the chest for restaging of his disease.  MEDICAL HISTORY: Past Medical History:  Diagnosis Date  . Adenocarcinoma of right lung, stage 1 ([facility]) 03/03/2017  . Cancer ([place])    lung cancer 2005  . Cancer ([place])    skin cancer, mouth cancer  . COPD (chronic obstructive pulmonary disease) ([facility])   . Glaucoma   . Hereditary and idiopathic peripheral neuropathy 09/26/2014  . Hyperlipidemia   . Hypertension   . Ischemic colitis ([place])   . 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 Polio   . Sleep apnea    no CPAP    ALLERGIES:  is allergic to no known allergies.  MEDICATIONS:  Current Outpatient Medications  Medication Sig Dispense Refill  . carvedilol (COREG) 3.125 MG tablet Take 3.125 mg by mouth 2 (two) times daily with a meal.    . latanoprost (XALATAN) 0.005 % ophthalmic solution Place 1 drop into both eyes at bedtime.    Marland Kitchen losartan (COZAAR) 50 MG tablet Take 50 mg by mouth daily.    . Multiple Vitamin (MULTIVITAMIN WITH MINERALS) TABS tablet Take 1 tablet by mouth daily.    .  Tiotropium Bromide Monohydrate (SPIRIVA HANDIHALER IN) Inhale 1 Inhaler into the lungs.    . warfarin (COUMADIN) 5 MG tablet Take 1 tablet (5 mg total) by mouth as directed. (Patient taking differently: Take 5-7.5 mg by mouth See admin instructions. Monday, Wednesday, and Friday 7.5 mg (1.5 tablets) & 5 mg (1 tablet) all other days.) 125 tablet 4   No current facility-administered medications for this visit.    SURGICAL HISTORY:  Past Surgical History:  Procedure Laterality Date  . HERNIA REPAIR     left ingunal  . LUNG REMOVAL, PARTIAL Left   . SEGMENTECOMY Right 01/12/2017   Procedure: RIGHT SUPERIOR SEGMENTECTOMY;  Surgeon: Melrose Nakayama, MD;  Location: [facility];  Service: Thoracic;  Laterality: Right;  . SUBCLAVIAN STENT PLACEMENT    . TONSILLECTOMY    . VIDEO ASSISTED THORACOSCOPY Right 01/12/2017   Procedure: VIDEO ASSISTED THORACOSCOPY;  Surgeon: Melrose Nakayama, MD;  Location: [facility];  Service: Thoracic;  Laterality: Right;    REVIEW OF SYSTEMS:  A comprehensive review of systems was negative except for: Respiratory: positive for dyspnea on exertion   PHYSICAL EXAMINATION: General appearance: alert, cooperative and no distress Head: Normocephalic, without obvious abnormality, atraumatic Neck: no adenopathy, no JVD, supple, symmetrical, trachea midline and thyroid not enlarged, symmetric, no tenderness/mass/nodules Lymph nodes: Cervical, supraclavicular, and axillary nodes normal. Resp: clear to auscultation bilaterally Back: symmetric, no curvature. ROM normal. No CVA  tenderness. Cardio: regular rate and rhythm, S1, S2 normal, no murmur, click, rub or gallop GI: soft, non-tender; bowel sounds normal; no masses,  no organomegaly Extremities: extremities normal, atraumatic, no cyanosis or edema  ECOG PERFORMANCE STATUS: 1 - Symptomatic but completely ambulatory  Blood pressure 130/78, pulse 83, temperature (!) 97.1 F (36.2 C), temperature source Tympanic, resp. rate  18, height 5\' 9"  (1.753 m), weight 214 lb 9.6 oz (97.3 kg), SpO2 95 %.  LABORATORY DATA: Lab Results  Component Value Date   WBC 8.3 09/23/2020   HGB 15.7 09/23/2020   HCT 46.6 09/23/2020   MCV 93.8 09/23/2020   PLT 223 09/23/2020      Chemistry      Component Value Date/Time   NA 137 09/23/2020 1158   NA 138 09/07/2017 1051   K 4.4 09/23/2020 1158   K 4.7 09/07/2017 1051   CL 102 09/23/2020 1158   CO2 28 09/23/2020 1158   CO2 28 09/07/2017 1051   BUN 8 09/23/2020 1158   BUN 8.6 09/07/2017 1051   CREATININE 0.74 09/23/2020 1158   CREATININE 0.8 09/07/2017 1051      Component Value Date/Time   CALCIUM 9.6 09/23/2020 1158   CALCIUM 9.5 09/07/2017 1051   ALKPHOS 99 09/23/2020 1158   ALKPHOS 107 09/07/2017 1051   AST 25 09/23/2020 1158   AST 42 (H) 09/07/2017 1051   ALT 24 09/23/2020 1158   ALT 36 09/07/2017 1051   BILITOT 0.6 09/23/2020 1158   BILITOT 0.54 09/07/2017 1051       RADIOGRAPHIC STUDIES: CT Chest W Contrast  Result Date: 09/23/2020 CLINICAL DATA:  Lung cancer.  Chemotherapy complete. EXAM: CT CHEST WITH CONTRAST TECHNIQUE: Multidetector CT imaging of the chest was performed during intravenous contrast administration. CONTRAST:  62mL OMNIPAQUE IOHEXOL 300 MG/ML  SOLN COMPARISON:  09/21/2019. FINDINGS: Cardiovascular: Atherosclerotic calcification of the aorta, aortic valve and coronary arteries. A stent is seen in the left subclavian vein. Heart size within normal limits. No pericardial effusion. Mediastinum/Nodes: No pathologically enlarged mediastinal, hilar or axillary lymph nodes. There may be distal esophageal wall thickening which can be seen with gastroesophageal reflux. Lungs/Pleura: Centrilobular and paraseptal emphysema. Postoperative changes in the right hemithorax with associated scarring. 3 mm medial right upper lobe nodule (7/30), unchanged. Left upper lobectomy. No pleural fluid. Airway is otherwise unremarkable. Upper Abdomen: Visualized portion of  the liver appears decreased in attenuation diffusely. Visualized portions of the liver, gallbladder, adrenal glands, kidneys and spleen are otherwise unremarkable. A coarse calcification in the pancreatic tail is unchanged. Visualized portions of the pancreas, stomach and bowel are otherwise unremarkable. Mild mesenteric haziness and nodularity, nonspecific. No upper abdominal adenopathy. Musculoskeletal: Degenerative changes in the spine. Old rib fractures. Mild anterior wedging of thoracic vertebral bodies, unchanged. No worrisome lytic or sclerotic lesions. IMPRESSION: 1. No evidence of recurrent or metastatic disease. 2. Hepatic steatosis. 3. Aortic atherosclerosis (ICD10-I70.0). Coronary artery calcification. 4.  Emphysema (ICD10-J43.9). Electronically Signed   By: Lorin Picket M.D.   On: 09/23/2020 14:25    ASSESSMENT AND PLAN: This is a very pleasant 74 years old white male with stage IA non-small cell lung cancer, adenocarcinoma status post right lower lobe superior segmentectomy with lymph node dissection in January 2018. The patient is currently on observation and he is feeling fine today with no concerning complaints. He had repeat CT scan of the chest performed recently.  I personally and independently reviewed the scans and discussed the results with the patient and his wife.  His scan showed no concerning findings for disease recurrence or metastasis. I recommended for the patient to continue on observation with repeat CT scan of the chest in 1 year. For the coronary artery disease, he is followed by cardiology and his primary care physician. He was advised to call immediately if he has any concerning symptoms in the interval. The patient voices understanding of current disease status and treatment options and is in agreement with the current care plan.  All questions were answered. The patient knows to call the clinic with any problems, questions or concerns. We can certainly see the  patient much sooner if necessary.  Disclaimer: This note was dictated with voice recognition software. Similar sounding words can inadvertently be transcribed and may not be corrected upon review.

## 2020-09-25 NOTE — Addendum Note (Signed)
Addended by: Claretha Cooper on: 09/25/2020 11:54 AM   Modules accepted: Orders

## 2020-10-21 ENCOUNTER — Other Ambulatory Visit: Payer: Self-pay

## 2020-10-21 ENCOUNTER — Encounter: Payer: Self-pay | Admitting: Podiatry

## 2020-10-21 ENCOUNTER — Ambulatory Visit (INDEPENDENT_AMBULATORY_CARE_PROVIDER_SITE_OTHER): Payer: Medicare Other | Admitting: Podiatry

## 2020-10-21 DIAGNOSIS — M79675 Pain in left toe(s): Secondary | ICD-10-CM

## 2020-10-21 DIAGNOSIS — M79674 Pain in right toe(s): Secondary | ICD-10-CM

## 2020-10-21 DIAGNOSIS — L84 Corns and callosities: Secondary | ICD-10-CM

## 2020-10-21 DIAGNOSIS — M21621 Bunionette of right foot: Secondary | ICD-10-CM

## 2020-10-21 DIAGNOSIS — B351 Tinea unguium: Secondary | ICD-10-CM | POA: Diagnosis not present

## 2020-10-21 NOTE — Progress Notes (Signed)
This patient returns to my office for at risk foot care.  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  The ulcer right foot has resolved.  This patient is unable to cut nails himself since the patient cannot reach his nails.These nails are painful walking and wearing shoes.  This patient presents for at risk foot care today.  General Appearance  Alert, conversant and in no acute stress.  Vascular  Dorsalis pedis and posterior tibial  pulses are palpable  bilaterally.  Capillary return is within normal limits  bilaterally. Temperature is within normal limits  bilaterally.  Neurologic  Senn-Weinstein monofilament wire test within normal limits  bilaterally. Muscle power within normal limits bilaterally.  Nails Thick disfigured discolored nails with subungual debris  Hallux nails  bilaterally. No evidence of bacterial infection or drainage bilaterally.  Orthopedic  No limitations of motion  feet .  No crepitus or effusions noted.  No bony pathology or digital deformities noted.  Tailors bunion right foot  Skin  normotropic skin with no porokeratosis noted bilaterally.  No signs of infections or ulcers noted.   Callus sub 5th right with no evidence of ulcer.  Onychomycosis  Pain in right toes  Pain in left toes  Consent was obtained for treatment procedures.   Mechanical debridement of nails 1-5  bilaterally performed with a nail nipper.  Filed with dremel without incident.    Return office visit    3 months                  Told patient to return for periodic foot care and evaluation due to potential at risk complications.   Gardiner Barefoot DPM

## 2020-12-31 ENCOUNTER — Other Ambulatory Visit: Payer: Self-pay

## 2020-12-31 ENCOUNTER — Encounter: Payer: Self-pay | Admitting: Podiatry

## 2020-12-31 ENCOUNTER — Ambulatory Visit (INDEPENDENT_AMBULATORY_CARE_PROVIDER_SITE_OTHER): Payer: Medicare Other | Admitting: Podiatry

## 2020-12-31 DIAGNOSIS — M79675 Pain in left toe(s): Secondary | ICD-10-CM | POA: Diagnosis not present

## 2020-12-31 DIAGNOSIS — L84 Corns and callosities: Secondary | ICD-10-CM | POA: Diagnosis not present

## 2020-12-31 DIAGNOSIS — B351 Tinea unguium: Secondary | ICD-10-CM

## 2020-12-31 DIAGNOSIS — M79674 Pain in right toe(s): Secondary | ICD-10-CM

## 2020-12-31 DIAGNOSIS — D689 Coagulation defect, unspecified: Secondary | ICD-10-CM

## 2020-12-31 NOTE — Progress Notes (Signed)
This patient returns to my office for at risk foot care.  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  The ulcer right foot has resolved.  This patient is unable to cut nails himself since the patient cannot reach his nails.These nails are painful walking and wearing shoes.  This patient presents for at risk foot care today.  General Appearance  Alert, conversant and in no acute stress.  Vascular  Dorsalis pedis and posterior tibial  pulses are palpable  bilaterally.  Capillary return is within normal limits  bilaterally. Temperature is within normal limits  bilaterally.  Neurologic  Senn-Weinstein monofilament wire test within normal limits  bilaterally. Muscle power within normal limits bilaterally.  Nails Thick disfigured discolored nails with subungual debris  Hallux nails  To fifth toenails  B/L.Marland Kitchen No evidence of bacterial infection or drainage bilaterally.  Orthopedic  No limitations of motion  feet .  No crepitus or effusions noted.  No bony pathology or digital deformities noted.  Tailors bunion right foot  Skin  normotropic skin with no porokeratosis noted bilaterally.  No signs of infections or ulcers noted.   Callus sub 5th right with no evidence of ulcer.  Onychomycosis  Pain in right toes  Pain in left toes  Hematogenous callus sub 5th right foot.  Consent was obtained for treatment procedures.   Mechanical debridement of nails 1-5  bilaterally performed with a nail nipper.  Filed with dremel without incident. Debride callus with # 15 blade. Dispense gel foam dispersion sheet.  Told patient to check on second toe left which is red and shiny at proximal nail fold.   Return office visit    9 weeks.                 Told patient to return for periodic foot care and evaluation due to potential at risk complications.   Gardiner Barefoot DPM

## 2021-02-09 ENCOUNTER — Other Ambulatory Visit: Payer: Self-pay

## 2021-02-09 ENCOUNTER — Other Ambulatory Visit: Payer: Medicare Other | Admitting: Orthotics

## 2021-03-20 ENCOUNTER — Encounter: Payer: Self-pay | Admitting: Podiatry

## 2021-03-20 ENCOUNTER — Ambulatory Visit (INDEPENDENT_AMBULATORY_CARE_PROVIDER_SITE_OTHER): Payer: Medicare Other | Admitting: Podiatry

## 2021-03-20 ENCOUNTER — Other Ambulatory Visit: Payer: Self-pay

## 2021-03-20 DIAGNOSIS — M79675 Pain in left toe(s): Secondary | ICD-10-CM | POA: Diagnosis not present

## 2021-03-20 DIAGNOSIS — B351 Tinea unguium: Secondary | ICD-10-CM | POA: Diagnosis not present

## 2021-03-20 DIAGNOSIS — L84 Corns and callosities: Secondary | ICD-10-CM | POA: Diagnosis not present

## 2021-03-20 DIAGNOSIS — M216X1 Other acquired deformities of right foot: Secondary | ICD-10-CM | POA: Diagnosis not present

## 2021-03-20 DIAGNOSIS — M79674 Pain in right toe(s): Secondary | ICD-10-CM

## 2021-03-20 DIAGNOSIS — D689 Coagulation defect, unspecified: Secondary | ICD-10-CM | POA: Diagnosis not present

## 2021-03-20 NOTE — Progress Notes (Signed)
This patient returns to my office for at risk foot care.  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  The ulcer right foot has resolved.  This patient is unable to cut nails himself since the patient cannot reach his nails.These nails are painful walking and wearing shoes.  This patient presents for at risk foot care today.  General Appearance  Alert, conversant and in no acute stress.  Vascular  Dorsalis pedis and posterior tibial  pulses are palpable  bilaterally.  Capillary return is within normal limits  bilaterally. Temperature is within normal limits  bilaterally.  Neurologic  Senn-Weinstein monofilament wire test within normal limits  bilaterally. Muscle power within normal limits bilaterally.  Nails Thick disfigured discolored nails with subungual debris  Hallux nails  To fifth toenails  B/L.Marland Kitchen No evidence of bacterial infection or drainage bilaterally.  Orthopedic  No limitations of motion  feet .  No crepitus or effusions noted.  No bony pathology or digital deformities noted.  Tailors bunion right foot  Skin  normotropic skin with no porokeratosis noted bilaterally.  No signs of infections or ulcers noted. Hemorrhagic callus sub 5th right   Onychomycosis  Pain in right toes  Pain in left toes  Hematogenous callus sub 5th right foot.  Consent was obtained for treatment procedures.   Mechanical debridement of nails 1-5  bilaterally performed with a nail nipper.  Filed with dremel without incident. Debride callus with # 15 blade. Dispense gel foam dispersion sheet.  Discussed resection fifth met head right foot before callus ulcerates. Patient wears dispersion insoles.  Return office visit    9 weeks.                 Told patient to return for periodic foot care and evaluation due to potential at risk complications.   Gardiner Barefoot DPM

## 2021-04-10 ENCOUNTER — Ambulatory Visit (INDEPENDENT_AMBULATORY_CARE_PROVIDER_SITE_OTHER): Payer: Medicare Other | Admitting: Podiatry

## 2021-04-10 ENCOUNTER — Other Ambulatory Visit: Payer: Self-pay

## 2021-04-10 DIAGNOSIS — L97512 Non-pressure chronic ulcer of other part of right foot with fat layer exposed: Secondary | ICD-10-CM | POA: Diagnosis not present

## 2021-04-15 ENCOUNTER — Encounter: Payer: Self-pay | Admitting: Podiatry

## 2021-04-15 ENCOUNTER — Telehealth: Payer: Self-pay | Admitting: Podiatry

## 2021-04-15 NOTE — Telephone Encounter (Signed)
Yea she can

## 2021-04-15 NOTE — Telephone Encounter (Signed)
Can she still use the Betadine

## 2021-04-15 NOTE — Telephone Encounter (Signed)
Patient wife called and stated that her husband has a ulcer on the bottom of his foot and she is using the regular guaze and when she unwraps the foot the skin comes off. She also tried using the non-stick guaze as well. What do you advise?  Can she use antibiotic ointment

## 2021-04-15 NOTE — Telephone Encounter (Signed)
She can use triple antibiotic ointment

## 2021-04-15 NOTE — Progress Notes (Signed)
Subjective:  Patient ID: [name], male    DOB: Nov 13, 1946,  MRN: [id number]  Chief Complaint  Patient presents with  . Callouses    Bleeding callus     75 y.o. male presents for wound care.  Patient presents with complaint of its fifth submetatarsal ulcer.  Patient states that his third came out of nowhere has progressive gotten worse.  He states that it was a bleeding first.  He denies any treatment options for it he came in right away.  He is not a diabetic.  He denies any other acute complaints.  He has a Darco wedge shoe at home.   Review of Systems: Negative except as noted in the HPI. Denies N/V/F/Ch.  Past Medical History:  Diagnosis Date  . Adenocarcinoma of right lung, stage 1 ([facility]) 03/03/2017  . Cancer ([name])    lung cancer 2005  . Cancer ([employer])    skin cancer, mouth cancer  . COPD (chronic obstructive pulmonary disease) ([place])   . Glaucoma   . Hereditary and idiopathic peripheral neuropathy 09/26/2014  . Hyperlipidemia   . Hypertension   . Ischemic colitis ([place])   . Left subclavian vein thrombosis ([facility])   . Polio   . Sleep apnea    no CPAP    Current Outpatient Medications:  .  Bacillus Coagulans-Inulin (PROBIOTIC) 1-250 BILLION-MG CAPS, Take 1 capsule by mouth daily., Disp: , Rfl:  .  carvedilol (COREG) 3.125 MG tablet, Take 3.125 mg by mouth 2 (two) times daily with a meal., Disp: , Rfl:  .  latanoprost (XALATAN) 0.005 % ophthalmic solution, Place 1 drop into both eyes at bedtime., Disp: , Rfl:  .  Multiple Vitamin (MULTIVITAMIN WITH MINERALS) TABS tablet, Take 1 tablet by mouth daily., Disp: , Rfl:  .  rosuvastatin (CRESTOR) 5 MG tablet, Take 5 mg by mouth daily., Disp: , Rfl:  .  SPIRIVA HANDIHALER 18 MCG inhalation capsule, 1 capsule daily., Disp: , Rfl:  .  Tiotropium Bromide Monohydrate (SPIRIVA HANDIHALER IN), Inhale 1 Inhaler into the lungs., Disp: , Rfl:  .  warfarin (COUMADIN) 5 MG tablet, Take 1 tablet (5 mg total) by mouth as directed. (Patient  taking differently: Take 5-7.5 mg by mouth See admin instructions. Monday, Wednesday, and Friday 7.5 mg (1.5 tablets) & 5 mg (1 tablet) all other days.), Disp: 125 tablet, Rfl: 4  Social History   Tobacco Use  Smoking Status Former Smoker  . Packs/day: 2.00  . Years: 40.00  . Pack years: 80.00  . Types: Cigarettes  . Quit date: 2004  . Years since quitting: 18.3  Smokeless Tobacco Never Used    Allergies  Allergen Reactions  . No Known Allergies    Objective:  There were no vitals filed for this visit. There is no height or weight on file to calculate BMI. Constitutional Well developed. Well nourished.  Vascular Dorsalis pedis pulses palpable bilaterally. Posterior tibial pulses palpable bilaterally. Capillary refill normal to all digits.  No cyanosis or clubbing noted. Pedal hair growth normal.  Neurologic Normal speech. Oriented to person, place, and time. Protective sensation absent  Dermatologic Wound Location: Right submetatarsal 5 with fat layer exposed Wound Base: Mixed Granular/Fibrotic Peri-wound: Calloused Exudate: Scant/small amount Serous exudate Wound Measurements: -See below  Orthopedic: No pain to palpation either foot.   Radiographs: None Assessment:   1. Right foot ulcer, with fat layer exposed ([place])    Plan:  Patient was evaluated and treated and all questions answered.  Ulcer right submetatarsal  5 with fat layer exposed -Debridement as below. -Dressed with Betadine, DSD. -Continue off-loading with surgical shoe.  Procedure: Excisional Debridement of Wound Tool: Sharp chisel blade/tissue nipper Rationale: Removal of non-viable soft tissue from the wound to promote healing.  Anesthesia: none Pre-Debridement Wound Measurements: 0.7 cm x 0.4 cm x 0.3 cm  Post-Debridement Wound Measurements: 0.9 cm x 0.5 cm x 0.3 cm  Type of Debridement: Sharp Excisional Tissue Removed: Non-viable soft tissue Blood loss: Minimal (<50cc) Depth of Debridement:  subcutaneous tissue. Technique: Sharp excisional debridement to bleeding, viable wound base.  Wound Progress: This is my initial evaluation of continue monitor the progression of it. Site healing conversation 7 Dressing: Dry, sterile, compression dressing. Disposition: Patient tolerated procedure well. Patient to return in 1 week for follow-up.  No follow-ups on file.

## 2021-04-15 NOTE — Telephone Encounter (Signed)
Spoke to patient's wife.

## 2021-04-24 ENCOUNTER — Other Ambulatory Visit: Payer: Self-pay

## 2021-04-24 ENCOUNTER — Ambulatory Visit (INDEPENDENT_AMBULATORY_CARE_PROVIDER_SITE_OTHER): Payer: Medicare Other | Admitting: Podiatry

## 2021-04-24 DIAGNOSIS — L97512 Non-pressure chronic ulcer of other part of right foot with fat layer exposed: Secondary | ICD-10-CM

## 2021-04-24 DIAGNOSIS — I999 Unspecified disorder of circulatory system: Secondary | ICD-10-CM

## 2021-04-29 ENCOUNTER — Encounter: Payer: Self-pay | Admitting: Podiatry

## 2021-04-29 NOTE — Progress Notes (Signed)
Subjective:  Patient ID: [name], male    DOB: [birth year],  MRN: [id number]  Chief Complaint  Patient presents with  . Foot Ulcer    Right foot     75 y.o. male presents for wound care.  P patient presents with follow-up of right submetatarsal 5 ulceration.  Patient states that it looks about the same has not gotten better he is not a diabetic.  He has not had vascular flow assessed recently.  Review of Systems: Negative except as noted in the HPI. Denies N/V/F/Ch.  Past Medical History:  Diagnosis Date  . Adenocarcinoma of right lung, stage 1 ([facility]) 03/03/2017  . Cancer ([facility])    lung cancer 2005  . Cancer ([place])    skin cancer, mouth cancer  . COPD (chronic obstructive pulmonary disease) ([place])   . Glaucoma   . Hereditary and idiopathic peripheral neuropathy 09/26/2014  . Hyperlipidemia   . Hypertension   . Ischemic colitis ([place])   . 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 Polio   . Sleep apnea    no CPAP    Current Outpatient Medications:  .  Bacillus Coagulans-Inulin (PROBIOTIC) 1-250 BILLION-MG CAPS, Take 1 capsule by mouth daily., Disp: , Rfl:  .  carvedilol (COREG) 3.125 MG tablet, Take 3.125 mg by mouth 2 (two) times daily with a meal., Disp: , Rfl:  .  latanoprost (XALATAN) 0.005 % ophthalmic solution, Place 1 drop into both eyes at bedtime., Disp: , Rfl:  .  Multiple Vitamin (MULTIVITAMIN WITH MINERALS) TABS tablet, Take 1 tablet by mouth daily., Disp: , Rfl:  .  rosuvastatin (CRESTOR) 5 MG tablet, Take 5 mg by mouth daily., Disp: , Rfl:  .  SPIRIVA HANDIHALER 18 MCG inhalation capsule, 1 capsule daily., Disp: , Rfl:  .  Tiotropium Bromide Monohydrate (SPIRIVA HANDIHALER IN), Inhale 1 Inhaler into the lungs., Disp: , Rfl:  .  warfarin (COUMADIN) 5 MG tablet, Take 1 tablet (5 mg total) by mouth as directed. (Patient taking differently: Take 5-7.5 mg by mouth See admin instructions. Monday, Wednesday, and Friday 7.5 mg (1.5 tablets) & 5 mg (1 tablet) all other  days.), Disp: 125 tablet, Rfl: 4  Social History   Tobacco Use  Smoking Status Former Smoker  . Packs/day: 2.00  . Years: 40.00  . Pack years: 80.00  . Types: Cigarettes  . Quit date: 2004  . Years since quitting: 18.3  Smokeless Tobacco Never Used    Allergies  Allergen Reactions  . No Known Allergies    Objective:  There were no vitals filed for this visit. There is no height or weight on file to calculate BMI. Constitutional Well developed. Well nourished.  Vascular Dorsalis pedis pulses faintly palpable bilaterally. Posterior tibial pulses faintly palpable bilaterally. Capillary refill normal to all digits.  No cyanosis or clubbing noted. Pedal hair growth normal.  Neurologic Normal speech. Oriented to person, place, and time. Protective sensation absent  Dermatologic Wound Location: Right submetatarsal 5 with fat layer exposed Wound Base: Mixed Granular/Fibrotic Peri-wound: Calloused Exudate: Scant/small amount Serous exudate Wound Measurements: -See below  Orthopedic: No pain to palpation either foot.   Radiographs: None Assessment:   1. Right foot ulcer, with fat layer exposed ([place])   2. Vascular abnormality    Plan:  Patient was evaluated and treated and all questions answered.  Ulcer right submetatarsal 5 with fat layer exposed -Debridement as below. -Dressed with Betadine, DSD. -Continue off-loading with surgical shoe. -ABIs PVRs were ordered to assess  the vascular flow to left right lower extremity  Procedure: Excisional Debridement of Wound~stagnant Tool: Sharp chisel blade/tissue nipper Rationale: Removal of non-viable soft tissue from the wound to promote healing.  Anesthesia: none Pre-Debridement Wound Measurements: 0.7 cm x 0.4 cm x 0.3 cm  Post-Debridement Wound Measurements: 0.9 cm x 0.5 cm x 0.3 cm  Type of Debridement: Sharp Excisional Tissue Removed: Non-viable soft tissue Blood loss: Minimal (<50cc) Depth of Debridement:  subcutaneous tissue. Technique: Sharp excisional debridement to bleeding, viable wound base.  Wound Progress: The wound appears to be stagnant with same measurements Dressing: Dry, sterile, compression dressing. Disposition: Patient tolerated procedure well. Patient to return in 1 week for follow-up.  No follow-ups on file.

## 2021-05-01 ENCOUNTER — Other Ambulatory Visit: Payer: Self-pay

## 2021-05-01 ENCOUNTER — Ambulatory Visit (INDEPENDENT_AMBULATORY_CARE_PROVIDER_SITE_OTHER): Payer: Medicare Other

## 2021-05-01 ENCOUNTER — Ambulatory Visit (INDEPENDENT_AMBULATORY_CARE_PROVIDER_SITE_OTHER): Payer: Medicare Other | Admitting: Podiatry

## 2021-05-01 ENCOUNTER — Ambulatory Visit (HOSPITAL_COMMUNITY)
Admission: RE | Admit: 2021-05-01 | Discharge: 2021-05-01 | Disposition: A | Discharge status: Home / Self Care | Payer: Medicare Other | Source: Ambulatory Visit | Attending: Podiatry | Admitting: Podiatry

## 2021-05-01 VITALS — Temp 98.0°F

## 2021-05-01 DIAGNOSIS — I999 Unspecified disorder of circulatory system: Secondary | ICD-10-CM

## 2021-05-01 DIAGNOSIS — R7301 Impaired fasting glucose: Secondary | ICD-10-CM | POA: Insufficient documentation

## 2021-05-01 DIAGNOSIS — H409 Unspecified glaucoma: Secondary | ICD-10-CM | POA: Insufficient documentation

## 2021-05-01 DIAGNOSIS — Z85118 Personal history of other malignant neoplasm of bronchus and lung: Secondary | ICD-10-CM | POA: Insufficient documentation

## 2021-05-01 DIAGNOSIS — F1020 Alcohol dependence, uncomplicated: Secondary | ICD-10-CM | POA: Insufficient documentation

## 2021-05-01 DIAGNOSIS — L97512 Non-pressure chronic ulcer of other part of right foot with fat layer exposed: Secondary | ICD-10-CM | POA: Diagnosis not present

## 2021-05-01 DIAGNOSIS — I700 Atherosclerosis of aorta: Secondary | ICD-10-CM | POA: Insufficient documentation

## 2021-05-01 DIAGNOSIS — F1010 Alcohol abuse, uncomplicated: Secondary | ICD-10-CM | POA: Insufficient documentation

## 2021-05-01 DIAGNOSIS — K559 Vascular disorder of intestine, unspecified: Secondary | ICD-10-CM | POA: Insufficient documentation

## 2021-05-01 DIAGNOSIS — Z7901 Long term (current) use of anticoagulants: Secondary | ICD-10-CM | POA: Insufficient documentation

## 2021-05-01 DIAGNOSIS — R4182 Altered mental status, unspecified: Secondary | ICD-10-CM | POA: Insufficient documentation

## 2021-05-01 DIAGNOSIS — I8290 Acute embolism and thrombosis of unspecified vein: Secondary | ICD-10-CM | POA: Insufficient documentation

## 2021-05-01 DIAGNOSIS — I5189 Other ill-defined heart diseases: Secondary | ICD-10-CM | POA: Insufficient documentation

## 2021-05-01 DIAGNOSIS — Z86718 Personal history of other venous thrombosis and embolism: Secondary | ICD-10-CM | POA: Insufficient documentation

## 2021-05-01 DIAGNOSIS — K760 Fatty (change of) liver, not elsewhere classified: Secondary | ICD-10-CM | POA: Insufficient documentation

## 2021-05-01 DIAGNOSIS — C3430 Malignant neoplasm of lower lobe, unspecified bronchus or lung: Secondary | ICD-10-CM | POA: Insufficient documentation

## 2021-05-01 DIAGNOSIS — Z6832 Body mass index (BMI) 32.0-32.9, adult: Secondary | ICD-10-CM | POA: Insufficient documentation

## 2021-05-05 ENCOUNTER — Encounter: Payer: Self-pay | Admitting: Podiatry

## 2021-05-05 NOTE — Progress Notes (Signed)
Subjective:  Patient ID: [name], male    DOB: 1946-09-02,  MRN: [id number]  Chief Complaint  Patient presents with  . Wound Check    Reports odor coming from ulcer as of last night. Reports betadine bath soaks and started round of doxy. Slightly warm to touch. Denies fever.     75 y.o. male presents for wound care.  P patient presents with follow-up of right submetatarsal 5 ulceration.  Patient states that it looks about the same has not gotten better he is not a diabetic.  He states his ABIs PVRs were done.  He is here to go over the results  Review of Systems: Negative except as noted in the HPI. Denies N/V/F/Ch.  Past Medical History:  Diagnosis Date  . Adenocarcinoma of right lung, stage 1 ([place]) 03/03/2017  . Cancer ([place])    lung cancer 2005  . Cancer ([place])    skin cancer, mouth cancer  . COPD (chronic obstructive pulmonary disease) ([place])   . Glaucoma   . Hereditary and idiopathic peripheral neuropathy 09/26/2014  . Hyperlipidemia   . Hypertension   . Ischemic colitis ([place])   . Left subclavian vein thrombosis ([place])   . Polio   . Sleep apnea    no CPAP    Current Outpatient Medications:  .  mupirocin ointment (BACTROBAN) 2 %, 1 application, Disp: , Rfl:  .  Bacillus Coagulans-Inulin (PROBIOTIC) 1-250 BILLION-MG CAPS, Take 1 capsule by mouth daily., Disp: , Rfl:  .  Brimonidine Tartrate (LUMIFY) 0.025 % SOLN, 1 drop into affected eye  as needed, Disp: , Rfl:  .  carvedilol (COREG) 3.125 MG tablet, Take 3.125 mg by mouth 2 (two) times daily with a meal., Disp: , Rfl:  .  doxycycline (VIBRAMYCIN) 100 MG capsule, Take 100 mg by mouth 2 (two) times daily., Disp: , Rfl:  .  latanoprost (XALATAN) 0.005 % ophthalmic solution, Place 1 drop into both eyes at bedtime., Disp: , Rfl:  .  loperamide (IMODIUM A-D) 2 MG tablet, 1 tablet as needed, Disp: , Rfl:  .  losartan (COZAAR) 50 MG tablet, Take 1 tablet by mouth daily., Disp: , Rfl:  .  metroNIDAZOLE (METROGEL) 0.75 % gel,  Apply topically., Disp: , Rfl:  .  Multiple Vitamin (MULTI VITAMIN) TABS, 1 tablet, Disp: , Rfl:  .  Multiple Vitamin (MULTIVITAMIN WITH MINERALS) TABS tablet, Take 1 tablet by mouth daily., Disp: , Rfl:  .  rosuvastatin (CRESTOR) 5 MG tablet, Take 5 mg by mouth daily., Disp: , Rfl:  .  SPIRIVA HANDIHALER 18 MCG inhalation capsule, 1 capsule daily., Disp: , Rfl:  .  Tiotropium Bromide Monohydrate (SPIRIVA HANDIHALER IN), Inhale 1 Inhaler into the lungs., Disp: , Rfl:  .  warfarin (COUMADIN) 5 MG tablet, Take 1 tablet (5 mg total) by mouth as directed. (Patient taking differently: Take 5-7.5 mg by mouth See admin instructions. Monday, Wednesday, and Friday 7.5 mg (1.5 tablets) & 5 mg (1 tablet) all other days.), Disp: 125 tablet, Rfl: 4  Social History   Tobacco Use  Smoking Status Former Smoker  . Packs/day: 2.00  . Years: 40.00  . Pack years: 80.00  . Types: Cigarettes  . Quit date: 2004  . Years since quitting: 18.3  Smokeless Tobacco Never Used    Allergies  Allergen Reactions  . No Known Allergies    Objective:   Vitals:   05/01/21 1358  Temp: 98 F (36.7 C)   There is no height or weight on  file to calculate BMI. Constitutional Well developed. Well nourished.  Vascular Dorsalis pedis pulses faintly palpable bilaterally. Posterior tibial pulses faintly palpable bilaterally. Capillary refill normal to all digits.  No cyanosis or clubbing noted. Pedal hair growth normal.  Neurologic Normal speech. Oriented to person, place, and time. Protective sensation absent  Dermatologic Wound Location: Right submetatarsal 5 with fat layer exposed Wound Base: Mixed Granular/Fibrotic Peri-wound: Calloused Exudate: Scant/small amount Serous exudate Wound Measurements: -See below  Orthopedic: No pain to palpation either foot.   Radiographs: 3 views of skeletally mature adult right foot: No cortical destruction noted.  No signs of osteomyelitis noted.  No bony abnormalities  identified.  No soft tissue emphysema noted. Assessment:   1. Right foot ulcer, with fat layer exposed ([place])   2. Vascular abnormality    Plan:  Patient was evaluated and treated and all questions answered.  Ulcer right submetatarsal 5 with fat layer exposed -Debridement as below. -Dressed with Betadine, DSD. -Continue off-loading with surgical shoe. -ABIs PVRs were reviewed with the patient in extensive detail.  At this time they are within normal limits.  Patient may still have microvessel disease.  If unable to resolve the wound over the next few weeks I will have him follow-up with Dr. Corrie Mckusick for vascular intervention  Procedure: Excisional Debridement of Wound~stagnant Tool: Sharp chisel blade/tissue nipper Rationale: Removal of non-viable soft tissue from the wound to promote healing.  Anesthesia: none Pre-Debridement Wound Measurements: 0.7 cm x 0.4 cm x 0.3 cm  Post-Debridement Wound Measurements: 0.9 cm x 0.5 cm x 0.3 cm  Type of Debridement: Sharp Excisional Tissue Removed: Non-viable soft tissue Blood loss: Minimal (<50cc) Depth of Debridement: subcutaneous tissue. Technique: Sharp excisional debridement to bleeding, viable wound base.  Wound Progress: The wound appears to be stagnant with same measurements Dressing: Dry, sterile, compression dressing. Disposition: Patient tolerated procedure well. Patient to return in 1 week for follow-up.  No follow-ups on file.

## 2021-05-13 ENCOUNTER — Other Ambulatory Visit: Payer: Self-pay | Admitting: Podiatry

## 2021-05-13 ENCOUNTER — Telehealth: Payer: Self-pay | Admitting: Podiatry

## 2021-05-13 NOTE — Telephone Encounter (Signed)
Patients wife called the office stating that Mr.Howald has a ulcer  And has been taking a antibiotic. She notice that a dark colored puss is now coming from the ulcer and she was starting to get concern. I talked to Dr.patel about Mr. Mcevoy and he states that he will refill the antibiotic. I informed Mrs.Whitfill.

## 2021-05-13 NOTE — Telephone Encounter (Signed)
Place him on doxycycline for 30 days.

## 2021-05-14 ENCOUNTER — Telehealth: Payer: Self-pay | Admitting: Podiatry

## 2021-05-14 MED ORDER — DOXYCYCLINE HYCLATE 100 MG PO TABS: 100.0000 mg | ORAL_TABLET | Freq: Two times a day (BID) | ORAL | 0 refills | Status: DC

## 2021-05-14 NOTE — Addendum Note (Signed)
Addended by: Boneta Lucks on: 05/14/2021 09:07 AM   Modules accepted: Orders

## 2021-05-14 NOTE — Telephone Encounter (Signed)
done

## 2021-05-14 NOTE — Telephone Encounter (Signed)
Patient called inquring about refill for doxycycline, stated he called yesterday and no one reached out to let him know if the refill was sent in. Patient has requested that the refill be sent into preferred pharmacy on file [name] [address]), Please Advise

## 2021-05-14 NOTE — Telephone Encounter (Signed)
Informed, thanks

## 2021-05-15 ENCOUNTER — Other Ambulatory Visit: Payer: Self-pay

## 2021-05-15 ENCOUNTER — Ambulatory Visit (INDEPENDENT_AMBULATORY_CARE_PROVIDER_SITE_OTHER): Payer: Medicare Other | Admitting: Podiatry

## 2021-05-15 DIAGNOSIS — L97512 Non-pressure chronic ulcer of other part of right foot with fat layer exposed: Secondary | ICD-10-CM

## 2021-05-20 ENCOUNTER — Encounter: Payer: Self-pay | Admitting: Podiatry

## 2021-05-20 NOTE — Progress Notes (Signed)
Subjective:  Patient ID: [name], male    DOB: [birth year],  MRN: [id number]  Chief Complaint  Patient presents with  . Wound Check    Wound check     75 y.o. male presents for wound care.  P patient presents with follow-up of right submetatarsal 5 ulceration.  Patient states that it looks about the same has not gotten better he is not a diabetic.  He states his ABIs PVRs were done.  He is here to go over the results  Review of Systems: Negative except as noted in the HPI. Denies N/V/F/Ch.  Past Medical History:  Diagnosis Date  . Adenocarcinoma of right lung, stage 1 ([place]) 03/03/2017  . Cancer ([place])    lung cancer 2005  . Cancer ([place])    skin cancer, mouth cancer  . COPD (chronic obstructive pulmonary disease) ([place])   . Glaucoma   . Hereditary and idiopathic peripheral neuropathy 09/26/2014  . Hyperlipidemia   . Hypertension   . Ischemic colitis ([place])   . Left subclavian vein thrombosis ([place])   . Polio   . Sleep apnea    no CPAP    Current Outpatient Medications:  .  Bacillus Coagulans-Inulin (PROBIOTIC) 1-250 BILLION-MG CAPS, Take 1 capsule by mouth daily., Disp: , Rfl:  .  Brimonidine Tartrate (LUMIFY) 0.025 % SOLN, 1 drop into affected eye  as needed, Disp: , Rfl:  .  carvedilol (COREG) 3.125 MG tablet, Take 3.125 mg by mouth 2 (two) times daily with a meal., Disp: , Rfl:  .  doxycycline (VIBRA-TABS) 100 MG tablet, Take 1 tablet (100 mg total) by mouth 2 (two) times daily., Disp: 60 tablet, Rfl: 0 .  doxycycline (VIBRAMYCIN) 100 MG capsule, Take 100 mg by mouth 2 (two) times daily., Disp: , Rfl:  .  latanoprost (XALATAN) 0.005 % ophthalmic solution, Place 1 drop into both eyes at bedtime., Disp: , Rfl:  .  loperamide (IMODIUM A-D) 2 MG tablet, 1 tablet as needed, Disp: , Rfl:  .  losartan (COZAAR) 50 MG tablet, Take 1 tablet by mouth daily., Disp: , Rfl:  .  metroNIDAZOLE (METROGEL) 0.75 % gel, Apply topically., Disp: , Rfl:  .  Multiple Vitamin (MULTI VITAMIN)  TABS, 1 tablet, Disp: , Rfl:  .  Multiple Vitamin (MULTIVITAMIN WITH MINERALS) TABS tablet, Take 1 tablet by mouth daily., Disp: , Rfl:  .  mupirocin ointment (BACTROBAN) 2 %, 1 application, Disp: , Rfl:  .  rosuvastatin (CRESTOR) 5 MG tablet, Take 5 mg by mouth daily., Disp: , Rfl:  .  SPIRIVA HANDIHALER 18 MCG inhalation capsule, 1 capsule daily., Disp: , Rfl:  .  Tiotropium Bromide Monohydrate (SPIRIVA HANDIHALER IN), Inhale 1 Inhaler into the lungs., Disp: , Rfl:  .  warfarin (COUMADIN) 5 MG tablet, Take 1 tablet (5 mg total) by mouth as directed. (Patient taking differently: Take 5-7.5 mg by mouth See admin instructions. Monday, Wednesday, and Friday 7.5 mg (1.5 tablets) & 5 mg (1 tablet) all other days.), Disp: 125 tablet, Rfl: 4  Social History   Tobacco Use  Smoking Status Former Smoker  . Packs/day: 2.00  . Years: 40.00  . Pack years: 80.00  . Types: Cigarettes  . Quit date: 2004  . Years since quitting: 18.4  Smokeless Tobacco Never Used    Allergies  Allergen Reactions  . No Known Allergies    Objective:   There were no vitals filed for this visit. There is no height or weight on file to calculate  BMI. Constitutional Well developed. Well nourished.  Vascular Dorsalis pedis pulses faintly palpable bilaterally. Posterior tibial pulses faintly palpable bilaterally. Capillary refill normal to all digits.  No cyanosis or clubbing noted. Pedal hair growth normal.  Neurologic Normal speech. Oriented to person, place, and time. Protective sensation absent  Dermatologic Wound Location: Right submetatarsal 5 with fat layer exposed Wound Base: Mixed Granular/Fibrotic Peri-wound: Calloused Exudate: Scant/small amount Serous exudate Wound Measurements: -See below  Orthopedic: No pain to palpation either foot.   Radiographs: 3 views of skeletally mature adult right foot: No cortical destruction noted.  No signs of osteomyelitis noted.  No bony abnormalities identified.  No  soft tissue emphysema noted. Assessment:   1. Right foot ulcer, with fat layer exposed ([place])    Plan:  Patient was evaluated and treated and all questions answered.  Ulcer right submetatarsal 5 with fat layer exposed -Debridement as below. -Dressed with Betadine, DSD. -Continue off-loading with surgical shoe. -ABIs PVRs were reviewed with the patient in extensive detail.  At this time they are within normal limits.  Patient may still have microvessel disease.  If unable to resolve the wound over the next few weeks I will have him follow-up with Dr. Corrie Mckusick for vascular intervention -Patient was given a referral to the wound care center for advanced wound care.  Procedure: Excisional Debridement of Wound~stagnant Tool: Sharp chisel blade/tissue nipper Rationale: Removal of non-viable soft tissue from the wound to promote healing.  Anesthesia: none Pre-Debridement Wound Measurements: 0.7 cm x 0.4 cm x 0.3 cm  Post-Debridement Wound Measurements: 0.9 cm x 0.5 cm x 0.3 cm  Type of Debridement: Sharp Excisional Tissue Removed: Non-viable soft tissue Blood loss: Minimal (<50cc) Depth of Debridement: subcutaneous tissue. Technique: Sharp excisional debridement to bleeding, viable wound base.  Wound Progress: The wound appears to be stagnant with same measurements Dressing: Dry, sterile, compression dressing. Disposition: Patient tolerated procedure well. Patient to return in 1 week for follow-up.  No follow-ups on file.

## 2021-05-22 ENCOUNTER — Ambulatory Visit: Payer: Medicare Other | Admitting: Podiatry

## 2021-05-27 ENCOUNTER — Ambulatory Visit: Payer: Medicare Other | Admitting: Podiatry

## 2021-06-02 ENCOUNTER — Other Ambulatory Visit: Payer: Self-pay

## 2021-06-02 ENCOUNTER — Ambulatory Visit (INDEPENDENT_AMBULATORY_CARE_PROVIDER_SITE_OTHER): Payer: Medicare Other | Admitting: Podiatry

## 2021-06-02 ENCOUNTER — Encounter: Payer: Self-pay | Admitting: Podiatry

## 2021-06-02 DIAGNOSIS — D689 Coagulation defect, unspecified: Secondary | ICD-10-CM

## 2021-06-02 DIAGNOSIS — L97511 Non-pressure chronic ulcer of other part of right foot limited to breakdown of skin: Secondary | ICD-10-CM

## 2021-06-02 DIAGNOSIS — M216X1 Other acquired deformities of right foot: Secondary | ICD-10-CM

## 2021-06-02 DIAGNOSIS — B351 Tinea unguium: Secondary | ICD-10-CM

## 2021-06-02 DIAGNOSIS — M79675 Pain in left toe(s): Secondary | ICD-10-CM

## 2021-06-02 DIAGNOSIS — M79674 Pain in right toe(s): Secondary | ICD-10-CM

## 2021-06-02 NOTE — Progress Notes (Signed)
This patient returns to my office for at risk foot care.  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  The ulcer right foot has resolved.  This patient is unable to cut nails himself since the patient cannot reach his nails.These nails are painful walking and wearing shoes.  This patient presents for at risk foot care today.  Patient has developed and is being treated by Dr.  Posey Pronto for open wound right foot.  Patient presents with a bandage on his right foot and with his wife.  General Appearance  Alert, conversant and in no acute stress.  Vascular  Dorsalis pedis and posterior tibial  pulses are palpable  bilaterally.  Capillary return is within normal limits  bilaterally. Temperature is within normal limits  bilaterally.  Neurologic  Senn-Weinstein monofilament wire test within normal limits  bilaterally. Muscle power within normal limits bilaterally.  Nails Thick disfigured discolored nails with subungual debris  Hallux nails  bilaterally. No evidence of bacterial infection or drainage bilaterally.  Orthopedic  No limitations of motion  feet .  No crepitus or effusions noted.  No bony pathology or digital deformities noted.  Tailors bunion right foot  Skin  normotropic skin with no porokeratosis noted bilaterally.  No signs of infections or ulcers noted.   Ulcer with bandage covering sub 5th met right foot.  Onychomycosis  Pain in right toes  Pain in left toes  Consent was obtained for treatment procedures.   Mechanical debridement of nails 1-5  bilaterally performed with a nail nipper.  Filed with dremel without incident.    Return office visit    10 weeks                 Told patient to return for periodic foot care and evaluation due to potential at risk complications.   Gardiner Barefoot DPM

## 2021-06-05 ENCOUNTER — Ambulatory Visit (INDEPENDENT_AMBULATORY_CARE_PROVIDER_SITE_OTHER): Payer: Medicare Other | Admitting: Podiatry

## 2021-06-05 ENCOUNTER — Other Ambulatory Visit: Payer: Self-pay

## 2021-06-05 DIAGNOSIS — L97512 Non-pressure chronic ulcer of other part of right foot with fat layer exposed: Secondary | ICD-10-CM

## 2021-06-06 ENCOUNTER — Emergency Department (HOSPITAL_BASED_OUTPATIENT_CLINIC_OR_DEPARTMENT_OTHER)
Admission: EM | Admit: 2021-06-06 | Discharge: 2021-06-06 | Disposition: A | Discharge status: Home / Self Care | Payer: Medicare Other | Attending: Emergency Medicine | Admitting: Emergency Medicine

## 2021-06-06 ENCOUNTER — Encounter (HOSPITAL_BASED_OUTPATIENT_CLINIC_OR_DEPARTMENT_OTHER): Payer: Self-pay

## 2021-06-06 ENCOUNTER — Other Ambulatory Visit: Payer: Self-pay

## 2021-06-06 ENCOUNTER — Emergency Department (HOSPITAL_BASED_OUTPATIENT_CLINIC_OR_DEPARTMENT_OTHER): Payer: Medicare Other

## 2021-06-06 DIAGNOSIS — Z85118 Personal history of other malignant neoplasm of bronchus and lung: Secondary | ICD-10-CM | POA: Insufficient documentation

## 2021-06-06 DIAGNOSIS — Z79899 Other long term (current) drug therapy: Secondary | ICD-10-CM | POA: Diagnosis not present

## 2021-06-06 DIAGNOSIS — I10 Essential (primary) hypertension: Secondary | ICD-10-CM | POA: Insufficient documentation

## 2021-06-06 DIAGNOSIS — L539 Erythematous condition, unspecified: Secondary | ICD-10-CM | POA: Insufficient documentation

## 2021-06-06 DIAGNOSIS — Z87891 Personal history of nicotine dependence: Secondary | ICD-10-CM | POA: Diagnosis not present

## 2021-06-06 DIAGNOSIS — Z85828 Personal history of other malignant neoplasm of skin: Secondary | ICD-10-CM | POA: Insufficient documentation

## 2021-06-06 DIAGNOSIS — M7989 Other specified soft tissue disorders: Secondary | ICD-10-CM | POA: Diagnosis present

## 2021-06-06 DIAGNOSIS — Z7901 Long term (current) use of anticoagulants: Secondary | ICD-10-CM | POA: Diagnosis not present

## 2021-06-06 DIAGNOSIS — J449 Chronic obstructive pulmonary disease, unspecified: Secondary | ICD-10-CM | POA: Insufficient documentation

## 2021-06-06 DIAGNOSIS — L03115 Cellulitis of right lower limb: Secondary | ICD-10-CM | POA: Diagnosis not present

## 2021-06-06 LAB — CBC WITH DIFFERENTIAL/PLATELET
Abs Immature Granulocytes: 0.04 10*3/uL (ref 0.00–0.07)
Basophils Absolute: 0.1 10*3/uL (ref 0.0–0.1)
Basophils Relative: 1 %
Eosinophils Absolute: 0.2 10*3/uL (ref 0.0–0.5)
Eosinophils Relative: 2 %
HCT: 42.8 % (ref 39.0–52.0)
Hemoglobin: 14.8 g/dL (ref 13.0–17.0)
Immature Granulocytes: 0 %
Lymphocytes Relative: 20 %
Lymphs Abs: 1.9 10*3/uL (ref 0.7–4.0)
MCH: 32 pg (ref 26.0–34.0)
MCHC: 34.6 g/dL (ref 30.0–36.0)
MCV: 92.4 fL (ref 80.0–100.0)
Monocytes Absolute: 1.1 10*3/uL — ABNORMAL HIGH (ref 0.1–1.0)
Monocytes Relative: 12 %
Neutro Abs: 6 10*3/uL (ref 1.7–7.7)
Neutrophils Relative %: 65 %
Platelets: 187 10*3/uL (ref 150–400)
RBC: 4.63 MIL/uL (ref 4.22–5.81)
RDW: 14.1 % (ref 11.5–15.5)
WBC: 9.4 10*3/uL (ref 4.0–10.5)
nRBC: 0 % (ref 0.0–0.2)

## 2021-06-06 LAB — COMPREHENSIVE METABOLIC PANEL
ALT: 19 U/L (ref 0–44)
AST: 17 U/L (ref 15–41)
Albumin: 4 g/dL (ref 3.5–5.0)
Alkaline Phosphatase: 81 U/L (ref 38–126)
Anion gap: 9 (ref 5–15)
BUN: 10 mg/dL (ref 8–23)
CO2: 26 mmol/L (ref 22–32)
Calcium: 9.1 mg/dL (ref 8.9–10.3)
Chloride: 101 mmol/L (ref 98–111)
Creatinine, Ser: 0.53 mg/dL — ABNORMAL LOW (ref 0.61–1.24)
GFR, Estimated: >60 mL/min (ref >60)
Glucose, Bld: 93 mg/dL (ref 70–99)
Potassium: 3.7 mmol/L (ref 3.5–5.1)
Sodium: 136 mmol/L (ref 135–145)
Total Bilirubin: 1 mg/dL (ref 0.3–1.2)
Total Protein: 6.7 g/dL (ref 6.5–8.1)

## 2021-06-06 LAB — PROTIME-INR
INR: 2 — ABNORMAL HIGH (ref 0.8–1.2)
Prothrombin Time: 22.5 seconds — ABNORMAL HIGH (ref 11.4–15.2)

## 2021-06-06 LAB — LACTIC ACID, PLASMA: Lactic Acid, Venous: 1 mmol/L (ref 0.5–1.9)

## 2021-06-06 NOTE — ED Notes (Signed)
Dr. Ronnie Derby office nurse phoned before his arrival to tell us he was seen today for increasing leg swelling. They recommend INR (chronic warfarin use); and they also mention that pt. Has been being treated for weeks for a lower extremity cellulitis. They also recommend venous doppler study.

## 2021-06-06 NOTE — ED Notes (Signed)
Pt foot wrapped with dressing prior to discharge. Pt asked for simple bandage so he could re-wrap his foot when he arrived at home with his prescription topical medication.

## 2021-06-06 NOTE — ED Provider Notes (Signed)
[facility] EMERGENCY DEPT Provider Note   CSN: [id number] Arrival date & time: 06/06/21  1436     History Chief Complaint  Patient presents with   Leg Swelling    [name] is a 75 y.o. male.  HPI 75 year old male presents with right leg swelling and redness. He has been dealing with a chronic wound to his right plantar foot for about 8 weeks.  He had a debrided at one point.  Has been on doxycycline since.  Went to the podiatrist yesterday and it seemed to be doing okay.  Since last night around 7 PM he all of a sudden has had redness and swelling to his right lower leg.  He has no pain but he also has neuropathy.  No fevers.  He is on warfarin for previous thrombosis though he has not had his INR checked as his doctor told him not to for over a month.  Past Medical History:  Diagnosis Date   Adenocarcinoma of right lung, stage 1 ([place]) 03/03/2017   Cancer ([place])    lung cancer 2005   Cancer 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[place])    Polio    Sleep apnea    no CPAP    Patient Active Problem List   Diagnosis Date Noted   Alcohol dependence ([place]) 05/01/2021   Altered mental status 05/01/2021   Hardening of the aorta (main artery of the heart) ([place]) 05/01/2021   Deep vein thrombosis (DVT) of non-extremity vein 29/92/4268   Diastolic dysfunction 34/19/6222   Fatty liver 05/01/2021   Glaucoma 05/01/2021   History of DVT (deep vein thrombosis) 05/01/2021   Impaired fasting glucose 05/01/2021   Long term (current) use of anticoagulants 05/01/2021   Malignant neoplasm of lower lobe, unspecified bronchus or lung ([place]) 05/01/2021   Body mass index (BMI) 32.0-32.9, adult 05/01/2021   Personal history of other malignant neoplasm of bronchus  and lung 05/01/2021   Vascular disorder of intestine ([place]) 05/01/2021   Plantar flexed metatarsal bone of right foot 03/20/2021   Ulcer of foot ([place]) 08/05/2020   Palpitations 04/25/2019   OSA (obstructive sleep apnea) 04/25/2019   Adenocarcinoma of right lung, stage 1 ([place]) 03/03/2017   S/P partial lobectomy of lung 01/12/2017   Nodule of right lung 12/28/2016   COPD GOLD II  10/07/2016   Pulmonary infiltrate 10/04/2016   Alcohol withdrawal ([place]) 03/19/2015   Near syncope 03/19/2015   Hypertension 03/19/2015   Hyperlipidemia 03/19/2015   Warfarin-induced coagulopathy ([place]) 03/19/2015   Hereditary and idiopathic peripheral neuropathy 09/26/2014    Past Surgical History:  Procedure Laterality Date   HERNIA REPAIR     left ingunal   LUNG REMOVAL, PARTIAL Left    SEGMENTECOMY Right 01/12/2017   Procedure: RIGHT SUPERIOR SEGMENTECTOMY;  Surgeon: Melrose Nakayama, MD;  Location: [facility];  Service: Thoracic;  Laterality: Right;   SUBCLAVIAN STENT PLACEMENT     TONSILLECTOMY     VIDEO ASSISTED THORACOSCOPY Right 01/12/2017   Procedure: VIDEO ASSISTED THORACOSCOPY;  Surgeon: Melrose Nakayama, MD;  Location: [facility] OR;  Service: Thoracic;  Laterality: Right;       Family History  Problem Relation Age of Onset   Cancer Mother  intestine   Hyperlipidemia Mother    Heart Problems Brother    Heart attack Brother 29       s/p CABG and redo 5 years later   Heart attack Father    Heart disease Father    Colon cancer Maternal Grandmother    Colon cancer Maternal Aunt     Social History   Tobacco Use   Smoking status: Former    Packs/day: 2.00    Years: 40.00    Pack years: 80.00    Types: Cigarettes    Quit date: 2004    Years since quitting: 18.4   Smokeless tobacco: Never  Vaping Use   Vaping Use: Never used  Substance Use Topics   Alcohol use: Yes    Alcohol/week: 42.0 standard drinks    Types: 42 Glasses of wine per week    Comment: night- Vodka 4 to five  drinks a night   Drug use: No    Home Medications Prior to Admission medications   Medication Sig Start Date End Date Taking? Authorizing Provider  Bacillus Coagulans-Inulin (PROBIOTIC) 1-250 BILLION-MG CAPS Take 1 capsule by mouth daily.    [provider]  Brimonidine Tartrate (LUMIFY) 0.025 % SOLN 1 drop into affected eye  as needed    [provider]  carvedilol (COREG) 3.125 MG tablet Take 3.125 mg by mouth 2 (two) times daily with a meal.    [provider]  doxycycline (VIBRA-TABS) 100 MG tablet Take 1 tablet (100 mg total) by mouth 2 (two) times daily. 05/14/21   Felipa Furnace, DPM  doxycycline (VIBRAMYCIN) 100 MG capsule Take 100 mg by mouth 2 (two) times daily. 04/30/21   [provider]  latanoprost (XALATAN) 0.005 % ophthalmic solution Place 1 drop into both eyes at bedtime.    [provider]  loperamide (IMODIUM A-D) 2 MG tablet 1 tablet as needed    [provider]  losartan (COZAAR) 50 MG tablet Take 1 tablet by mouth daily. 04/25/21   [provider]  metroNIDAZOLE (METROGEL) 0.75 % gel Apply topically. 04/07/21   [provider]  Multiple Vitamin (MULTI VITAMIN) TABS 1 tablet    [provider]  Multiple Vitamin (MULTIVITAMIN WITH MINERALS) TABS tablet Take 1 tablet by mouth daily.    [provider]  mupirocin ointment (BACTROBAN) 2 % 1 application 9/93/71   [provider]  rosuvastatin (CRESTOR) 5 MG tablet Take 5 mg by mouth daily. 07/10/20   [provider]  SPIRIVA HANDIHALER 18 MCG inhalation capsule 1 capsule daily. 08/26/20   [provider]  Tiotropium Bromide Monohydrate (SPIRIVA HANDIHALER IN) Inhale 1 Inhaler into the lungs.    [provider]  warfarin (COUMADIN) 5 MG tablet Take 1 tablet (5 mg total) by mouth as directed. Patient taking differently: Take 5-7.5 mg by mouth See admin instructions. Monday, Wednesday, and Friday 7.5 mg (1.5  tablets) & 5 mg (1 tablet) all other days. 11/22/11   Eston Esters, MD    Allergies    No known allergies  Review of Systems   Review of Systems  Constitutional:  Negative for fever.  Cardiovascular:  Positive for leg swelling.  Skin:  Positive for color change and wound.  All other systems reviewed and are negative.  Physical Exam Updated Vital Signs BP 139/75 (BP Location: Right Arm)   Pulse 94   Temp 98.3 F (36.8 C) (Oral)   Resp 18   Ht 5\' 9"  (1.753 m)  Wt 95.3 kg   SpO2 96%   BMI 31.01 kg/m   Physical Exam Vitals and nursing note reviewed.  Constitutional:      Appearance: He is well-developed.  HENT:     Head: Normocephalic and atraumatic.     Right Ear: External ear normal.     Left Ear: External ear normal.     Nose: Nose normal.  Eyes:     General:        Right eye: No discharge.        Left eye: No discharge.  Cardiovascular:     Rate and Rhythm: Normal rate and regular rhythm.     Pulses:          Dorsalis pedis pulses are 2+ on the right side.     Heart sounds: Normal heart sounds.  Pulmonary:     Effort: Pulmonary effort is normal.  Abdominal:     General: There is no distension.  Musculoskeletal:     Cervical back: Neck supple.     Comments: See pictures.  The patient has some redness and warmth to his lower leg and foot and ankle.  The wound is very superficial.  No tenderness.  Normal DP pulse.  Skin:    General: Skin is warm and dry.     Findings: Erythema present.  Neurological:     Mental Status: He is alert.  Psychiatric:        Mood and Affect: Mood is not anxious.       ED Results / Procedures / Treatments   Labs (all labs ordered are listed, but only abnormal results are displayed) Labs Reviewed  COMPREHENSIVE METABOLIC PANEL - Abnormal; Notable for the following components:      Result Value   Creatinine, Ser 0.53 (*)    All other components within normal limits  CBC WITH DIFFERENTIAL/PLATELET - Abnormal; Notable for the  following components:   Monocytes Absolute 1.1 (*)    All other components within normal limits  PROTIME-INR - Abnormal; Notable for the following components:   Prothrombin Time 22.5 (*)    INR 2.0 (*)    All other components within normal limits  LACTIC ACID, PLASMA    EKG None  Radiology US Venous Img Lower Unilateral Right  Result Date: 06/06/2021 CLINICAL DATA:  Right leg pain and swelling for 1 day EXAM: RIGHT LOWER EXTREMITY VENOUS DOPPLER ULTRASOUND TECHNIQUE: Gray-scale sonography with graded compression, as well as color Doppler and duplex ultrasound were performed to evaluate the lower extremity deep venous systems from the level of the common femoral vein and including the common femoral, femoral, profunda femoral, popliteal and calf veins including the posterior tibial, peroneal and gastrocnemius veins when visible. The superficial great saphenous vein was also interrogated. Spectral Doppler was utilized to evaluate flow at rest and with distal augmentation maneuvers in the common femoral, femoral and popliteal veins. COMPARISON:  None. FINDINGS: Contralateral Common Femoral Vein: Respiratory phasicity is normal and symmetric with the symptomatic side. No evidence of thrombus. Normal compressibility. Common Femoral Vein: No evidence of thrombus. Normal compressibility, respiratory phasicity and response to augmentation. Saphenofemoral Junction: No evidence of thrombus. Normal compressibility and flow on color Doppler imaging. Profunda Femoral Vein: No evidence of thrombus. Normal compressibility and flow on color Doppler imaging. Femoral Vein: No evidence of thrombus. Normal compressibility, respiratory phasicity and response to augmentation. Popliteal Vein: No evidence of thrombus. Normal compressibility, respiratory phasicity and response to augmentation. Calf Veins: No evidence of thrombus. Normal compressibility  and flow on color Doppler imaging. Superficial Great Saphenous Vein: No  evidence of thrombus. Normal compressibility. Venous Reflux:  None. Other Findings:  None. IMPRESSION: No evidence of deep venous thrombosis. Electronically Signed   By: Inez Catalina M.D.   On: 06/06/2021 18:25    Procedures Procedures   Medications Ordered in ED Medications - No data to display  ED Course  I have reviewed the triage vital signs and the nursing notes.  Pertinent labs & imaging results that were available during my care of the patient were reviewed by me and considered in my medical decision making (see chart for details).    MDM Rules/Calculators/A&P                          Patient was sent over from his doctor's office for DVT rule out.  His INR is barely subtherapeutic.  He also states he missed a dose a couple days ago.  He will need this further checked but his ultrasound is negative for DVT.  Given this the redness is concerning that it might be cellulitis.  His PCP has changed him to Keflex and given his well appearance, stable and benign vitals and labs, I do not think he needs IV antibiotics and is stable for discharge home with close outpatient follow-up. Final Clinical Impression(s) / ED Diagnoses Final diagnoses:  Cellulitis of right leg    Rx / DC Orders ED Discharge Orders     None        Sherwood Gambler, MD 06/07/21 0007

## 2021-06-06 NOTE — ED Triage Notes (Signed)
Patient states had a small wound come up on bottom of foot April 21st. Patient has hx of such. Podiatrist opened up wound on 22nd. Was put on doxycycline in May. Wound is not getting any better. Seen at PCP yesterday. This morning up with leg swelling with concerns of DVT. On warfarin.

## 2021-06-10 NOTE — Progress Notes (Signed)
Subjective:  Patient ID: [name], male    DOB: 04/25/1946,  MRN: [id number]  Chief Complaint  Patient presents with   Wound Check    Right foot wound check     75 y.o. male presents for wound care.  P patient presents with follow-up of right submetatarsal 5 ulceration.  Patient states that it looks about the same has not gotten better he is not a diabetic.  He denies any other acute complaints.  He states he made his wound care appointment.  Review of Systems: Negative except as noted in the HPI. Denies N/V/F/Ch.  Past Medical History:  Diagnosis Date   Adenocarcinoma of right lung, stage 1 ([place]) 03/03/2017   Cancer ([place])    lung cancer 2005   Cancer [facility])    skin cancer, mouth cancer   COPD (chronic obstructive pulmonary disease) (HCC)    Glaucoma    Hereditary and idiopathic peripheral neuropathy 09/26/2014   Hyperlipidemia    Hypertension    Ischemic colitis ([place])    Left subclavian vein thrombosis (HCC)    Polio    Sleep apnea    no CPAP    Current Outpatient Medications:    Bacillus Coagulans-Inulin (PROBIOTIC) 1-250 BILLION-MG CAPS, Take 1 capsule by mouth daily., Disp: , Rfl:    Brimonidine Tartrate (LUMIFY) 0.025 % SOLN, 1 drop into affected eye  as needed, Disp: , Rfl:    carvedilol (COREG) 3.125 MG tablet, Take 3.125 mg by mouth 2 (two) times daily with a meal., Disp: , Rfl:    doxycycline (VIBRA-TABS) 100 MG tablet, Take 1 tablet (100 mg total) by mouth 2 (two) times daily., Disp: 60 tablet, Rfl: 0   doxycycline (VIBRAMYCIN) 100 MG capsule, Take 100 mg by mouth 2 (two) times daily., Disp: , Rfl:    latanoprost (XALATAN) 0.005 % ophthalmic solution, Place 1 drop into both eyes at bedtime., Disp: , Rfl:    loperamide (IMODIUM A-D) 2 MG tablet, 1 tablet as needed, Disp: , Rfl:    losartan (COZAAR) 50 MG tablet, Take 1 tablet by mouth daily., Disp: , Rfl:    metroNIDAZOLE (METROGEL) 0.75 % gel, Apply topically., Disp: , Rfl:    Multiple Vitamin (MULTI VITAMIN)  TABS, 1 tablet, Disp: , Rfl:    Multiple Vitamin (MULTIVITAMIN WITH MINERALS) TABS tablet, Take 1 tablet by mouth daily., Disp: , Rfl:    mupirocin ointment (BACTROBAN) 2 %, 1 application, Disp: , Rfl:    rosuvastatin (CRESTOR) 5 MG tablet, Take 5 mg by mouth daily., Disp: , Rfl:    SPIRIVA HANDIHALER 18 MCG inhalation capsule, 1 capsule daily., Disp: , Rfl:    Tiotropium Bromide Monohydrate (SPIRIVA HANDIHALER IN), Inhale 1 Inhaler into the lungs., Disp: , Rfl:    warfarin (COUMADIN) 5 MG tablet, Take 1 tablet (5 mg total) by mouth as directed. (Patient taking differently: Take 5-7.5 mg by mouth See admin instructions. Monday, Wednesday, and Friday 7.5 mg (1.5 tablets) & 5 mg (1 tablet) all other days.), Disp: 125 tablet, Rfl: 4  Social History   Tobacco Use  Smoking Status Former   Packs/day: 2.00   Years: 40.00   Pack years: 80.00   Types: Cigarettes   Quit date: 2004   Years since quitting: 18.4  Smokeless Tobacco Never    Allergies  Allergen Reactions   No Known Allergies    Objective:   There were no vitals filed for this visit. There is no height or weight on file to calculate BMI.  Constitutional Well developed. Well nourished.  Vascular Dorsalis pedis pulses faintly palpable bilaterally. Posterior tibial pulses faintly palpable bilaterally. Capillary refill normal to all digits.  No cyanosis or clubbing noted. Pedal hair growth normal.  Neurologic Normal speech. Oriented to person, place, and time. Protective sensation absent  Dermatologic Wound Location: Right submetatarsal 5 with fat layer exposed Wound Base: Mixed Granular/Fibrotic Peri-wound: Calloused Exudate: Scant/small amount Serous exudate Wound Measurements: -See below  Orthopedic: No pain to palpation either foot.   Radiographs: 3 views of skeletally mature adult right foot: No cortical destruction noted.  No signs of osteomyelitis noted.  No bony abnormalities identified.  No soft tissue emphysema  noted. Assessment:   1. Right foot ulcer, with fat layer exposed ([place])     Plan:  Patient was evaluated and treated and all questions answered.  Ulcer right submetatarsal 5 with fat layer exposed -Debridement as below. -Dressed with Betadine, DSD. -Continue off-loading with surgical shoe. -ABIs PVRs were reviewed with the patient in extensive detail.  At this time they are within normal limits.  Patient may still have microvessel disease.  If unable to resolve the wound over the next few weeks I will have him follow-up with Dr. Corrie Mckusick for vascular intervention -Patient has wound care center appointment for advanced wound care next week.  I will defer further management to them.  Procedure: Excisional Debridement of Wound~stagnant Tool: Sharp chisel blade/tissue nipper Rationale: Removal of non-viable soft tissue from the wound to promote healing.  Anesthesia: none Pre-Debridement Wound Measurements: 0.7 cm x 0.4 cm x 0.3 cm  Post-Debridement Wound Measurements: 0.9 cm x 0.5 cm x 0.3 cm  Type of Debridement: Sharp Excisional Tissue Removed: Non-viable soft tissue Blood loss: Minimal (<50cc) Depth of Debridement: subcutaneous tissue. Technique: Sharp excisional debridement to bleeding, viable wound base.  Wound Progress: The wound appears to be stagnant with same measurements Dressing: Dry, sterile, compression dressing. Disposition: Patient tolerated procedure well. Patient to return in 1 week for follow-up.  No follow-ups on file.

## 2021-06-11 ENCOUNTER — Encounter (HOSPITAL_BASED_OUTPATIENT_CLINIC_OR_DEPARTMENT_OTHER): Payer: Medicare Other | Attending: Internal Medicine | Admitting: Internal Medicine

## 2021-06-11 ENCOUNTER — Other Ambulatory Visit: Payer: Self-pay

## 2021-06-11 DIAGNOSIS — Z86718 Personal history of other venous thrombosis and embolism: Secondary | ICD-10-CM | POA: Diagnosis not present

## 2021-06-11 DIAGNOSIS — G620 Drug-induced polyneuropathy: Secondary | ICD-10-CM | POA: Diagnosis not present

## 2021-06-11 DIAGNOSIS — L97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 with unspecified severity: Secondary | ICD-10-CM | POA: Diagnosis not present

## 2021-06-11 DIAGNOSIS — G4733 Obstructive sleep apnea (adult) (pediatric): Secondary | ICD-10-CM | POA: Diagnosis not present

## 2021-06-11 DIAGNOSIS — Z9221 Personal history of antineoplastic chemotherapy: Secondary | ICD-10-CM | POA: Insufficient documentation

## 2021-06-11 DIAGNOSIS — J449 Chronic obstructive pulmonary disease, unspecified: Secondary | ICD-10-CM | POA: Diagnosis not present

## 2021-06-16 NOTE — Progress Notes (Addendum)
[name] ([id number]) Visit Report for 06/11/2021 Chief Complaint Document Details Patient Name: Date of Service: [name] [name] 06/11/2021 2:45 PM Medical Record Number: [id number] Patient Account Number: [id number] Date of Birth/Sex: Treating RN: Mar 23, 1946 (74 y.o. [name], [name] Primary Care Provider: Yaakov Guthrie Other Clinician: Referring Provider: Treating Provider/Extender: Leigh Aurora in Treatment: 0 Information Obtained from: Patient Chief Complaint Right foot wound Electronic Signature(s) Signed: 06/11/2021 5:20:14 PM By: Kalman Shan DO Entered By: Kalman Shan on 06/11/2021 16:55:06 -------------------------------------------------------------------------------- HPI Details Patient Name: Date of Service: [name]. 06/11/2021 2:45 PM Medical Record Number: [id number] Patient Account Number: [id number] Date of Birth/Sex: Treating RN: 1946-01-04 (75 y.o. 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 He states this started a little over 2 months ago. This is now the third time he has developed a wound to this area. He followed with podiatry for this issue and has been using Betadine daily to it. He recently switched to silver alginate a week ago. He has been on doxycycline since May For this issue and just recently started a course of Keflex prescribed in the ED on 6/18 For possible cellulitis. Currently denies signs of infection. He uses a front offloading shoe to help with relieving the pressure associated with the wound formation. He reports  minimal drainage. He does not have sensation to his feet and denies any pain. Currently denies signs of infection. Electronic Signature(s) Signed: 06/11/2021 5:20:14 PM By: Kalman Shan DO Entered By: Kalman Shan on 06/11/2021 17:03:46 -------------------------------------------------------------------------------- Physical Exam Details Patient Name: Date of Service: [name]. 06/11/2021 2:45 PM Medical Record Number: [id number] Patient Account Number: [id number] Date of Birth/Sex: Treating RN: [birth year] (75 y.o. [name] Primary Care Provider: Other Clinician: Yaakov Guthrie Referring Provider: Treating Provider/Extender: Leigh Aurora in Treatment: 0 Constitutional respirations regular, non-labored and within target range for patient.Marland Kitchen Psychiatric pleasant and cooperative. Notes Right foot: T the lateral aspect there is an open wound with little depth. There is some nonviable tissue And scant granulation tissue present. No obvious signs o of infection. Electronic Signature(s) Signed: 06/11/2021 5:20:14 PM By: Kalman Shan DO Entered By: Kalman Shan on 06/11/2021 17:04:56 -------------------------------------------------------------------------------- Physician Orders Details Patient Name: Date of Service: [name]. 06/11/2021 2:45 PM Medical Record Number: [id number] Patient Account Number: [id number] Date of Birth/Sex: Treating RN: [birth year] (74 y.o. 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 Edema Control - Lymphedema / SCD / Other Elevate legs to the level of the heart or above for 30 minutes daily and/or when sitting, a frequency of: Avoid standing for long periods of time. Wound Treatment Wound #4 - Foot Wound Laterality: Right, Lateral Cleanser: Soap and Water 1 x  Per Day/15 Days Discharge Instructions: May shower and wash wound with dial antibacterial soap and water prior to dressing change. Cleanser: Wound Cleanser (DME) (Generic) 1 x Per Day/15 Days Discharge Instructions: Cleanse the wound with wound cleanser prior to applying a clean dressing using gauze sponges, not tissue or cotton balls. Prim Dressing: Santyl Ointment 1 x Per Day/15 Days ary Discharge Instructions: Apply nickel thick amount to wound bed as instructed Secondary Dressing: Woven Gauze Sponge, Non-Sterile 4x4 in (DME) (Generic) 1 x Per Day/15 Days Discharge Instructions: Apply over primary dressing as directed. Secondary Dressing: ABD Pad, 5x9 (DME) (Generic) 1 x Per Day/15 Days Discharge Instructions: Apply over primary dressing as directed. Secondary Dressing: Optifoam Non-Adhesive Dressing, 4x4 in (DME) (Generic) 1 x Per Day/15 Days Discharge Instructions: cut to make a foam donut and place over primary dresssing for offloading!! Secured With: Kerlix Roll Sterile, 4.5x3.1 (in/yd) (DME) (Generic) 1 x Per Day/15 Days Discharge Instructions: Secure with Kerlix as directed. Secured With: 108M Medipore H Soft Cloth Surgical T 4 x 2 (in/yd) (DME) (Generic) 1 x Per Day/15 Days ape Discharge Instructions: Secure dressing with tape as directed. Patient Medications llergies: No Known Allergies A Notifications Medication Indication Start End 06/11/2021 Santyl DOSE topical 250 unit/gram ointment - apply ointment to wound bed once daily Electronic Signature(s) Signed:  06/11/2021 5:20:14 PM By: Kalman Shan DO Entered By: Kalman Shan on 06/11/2021 17:05:32 Prescription 06/11/2021 -------------------------------------------------------------------------------- [name]. Kalman Shan DO Patient Name: Provider: 1946-09-08 [id number] Date of Birth: NPI#: [name] [id number] Sex: DEA #: [phone] Phone #: License #: [place] Patient Address: [place] Everett [address] [place], [place] [address] [place], [place] [address] [phone] Allergies No Known Allergies Medication Medication: Route: Strength: Form: Santyl 250 unit/gram topical ointment topical 250 unit/gram ointment Class: TOPICAL/MUCOUS MEMBR./SUBCUT ENZYMES . Dose: Frequency / Time: Indication: apply ointment to wound bed once daily Number of Refills: Number of Units: 1 Generic Substitution: Start Date: End Date: One Time Use: Substitution Permitted 1/66/0630 No Note to Pharmacy: Hand Signature: Date(s): Electronic Signature(s) Signed: 06/11/2021 5:20:14 PM By: Kalman Shan DO Entered By: Kalman Shan on 06/11/2021 17:05:33 -------------------------------------------------------------------------------- Problem List Details Patient Name: Date of Service: [name]. 06/11/2021 2:45 PM Medical Record Number: [id number] Patient Account Number: [id number] Date of Birth/Sex: Treating RN: Jun 22, 1946 (75 y.o. 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 06/11/2021 2:45 PM Medical Record Number: [id number] Patient Account Number: [id number] Date of Birth/Sex: Treating RN: August 14, 1946 (75 y.o. [name], [name] Primary Care Provider: Yaakov Guthrie Other Clinician: Referring Provider: Treating Provider/Extender: Leigh Aurora in Treatment: 0 Subjective Chief Complaint Information obtained from Patient Right foot wound History of Present Illness (HPI) Admission 6/23 Mr. Emilian Stawicki is a 75 year old male with a past medical history of chemo induced neuropathy from a history of adenocarcinoma of the  right lung and COPD that presents for a right foot wound. He states this started a little over 2 months ago. This is now the third time he has developed a wound to this area. He followed with podiatry for this issue and has been using Betadine daily to it. He recently switched to silver alginate a week ago. He has been on doxycycline since May For this issue and just recently started a course of Keflex prescribed in the ED on 6/18 For possible cellulitis. Currently denies signs of infection. He uses a front offloading shoe to help with relieving the pressure associated with the wound formation. He reports minimal drainage. He does not have sensation to his feet and denies any pain. Currently denies signs of infection. Patient History Information obtained from Patient. Allergies No Known Allergies Family History Cancer - Mother, Heart Disease - Father,Siblings, Hypertension -  Mother,Father,Siblings, No family history of Diabetes, Hereditary Spherocytosis, Kidney Disease, Lung Disease, Seizures, Stroke, Thyroid Problems, Tuberculosis. Social History Former smoker - quit 2005, Marital Status - Married, Alcohol Use - Daily - bottle wine per day, Drug Use - No History, Caffeine Use - Daily - coffee. Medical History Eyes Patient has history of Glaucoma - mild Denies history of Cataracts, Optic Neuritis Respiratory Patient has history of Chronic Obstructive Pulmonary Disease (COPD), Sleep Apnea - no CPAP Cardiovascular Patient has history of Hypertension Neurologic Patient has history of Neuropathy Oncologic Patient has history of Received Chemotherapy, Received Radiation Psychiatric Denies history of Anorexia/bulimia, Confinement Anxiety Hospitalization/Surgery History - left inguinal hernia repair. - lobectomy right and left lung. - mouth cancer surgery. - tonsilectomy. 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 Ear/Nose/Mouth/Throat Denies complaints or symptoms of Chronic sinus problems or rhinitis. Gastrointestinal Denies complaints or symptoms of Frequent diarrhea, Nausea, Vomiting. Endocrine Denies complaints or symptoms of Heat/cold intolerance. Genitourinary Denies complaints or symptoms of Frequent urination. Integumentary (Skin) Complains or has symptoms of Wounds - wound on right foot. Psychiatric Denies complaints or symptoms of Claustrophobia, Suicidal. Objective Constitutional respirations regular, non-labored and within target range for patient.. Vitals Time Taken: 2:48  PM, Height: 69 in, Source: Stated, Weight: 210 lbs, Source: Stated, BMI: 31. Psychiatric pleasant and cooperative. General Notes: Right foot: T the lateral aspect there is an open wound with little depth. There is some nonviable tissue And scant granulation tissue present. o No obvious signs of infection. Integumentary (Hair, Skin) Wound #4 status is Open. Original cause of wound was Gradually Appeared. The date acquired was: 03/30/2021. The wound is located on the Right,Lateral Foot. The wound measures 2.5cm length x 2.8cm width x 0.1cm depth; 5.498cm^2 area and 0.55cm^3 volume. There is Fat Layer (Subcutaneous Tissue) exposed. There is no tunneling or undermining noted. There is a medium amount of serosanguineous drainage noted. The wound margin is flat and intact. There is medium (34-66%) pink granulation within the wound bed. There is a medium (34-66%) amount of necrotic tissue within the wound bed including Adherent Slough. 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 He has been seen in our clinic several times for this issue. However he has been followed by podiatry for the past couple months for this current issue. I am not quite sure why he has been on doxycycline for 2 months by his podiatrist. Based on imaging and history there was no signs of bone infection. He also had a course of Keflex recently started by the ED On 6/18 for possible cellulitis. On exam I do not see signs of infection. I recommended he just finish the course prescribed. There is some nonviable tissue in the wound bed and I recommended Santyl. I would like a better wound bed before using silver  alginate. Plan Bathing/ Shower/ Hygiene: May shower with protection but do not get wound dressing(s) wet. Edema Control - Lymphedema / SCD / Other: Elevate legs to the level of the heart or above for 30 minutes daily and/or when sitting, a frequency of: Avoid standing for long periods of time. The following medication(s) was prescribed: Santyl topical 250 unit/gram ointment apply ointment to wound bed once daily starting 06/11/2021 WOUND #4: - Foot Wound Laterality: Right, Lateral Cleanser: Soap and Water 1 x Per Day/15 Days Discharge Instructions: May shower and wash wound with dial antibacterial soap and water prior to dressing change. Cleanser: Wound Cleanser (DME) (Generic) 1 x Per Day/15 Days Discharge Instructions: Cleanse the wound with wound cleanser prior to applying a clean dressing using gauze sponges, not tissue or cotton balls. Prim Dressing: Santyl Ointment 1 x Per Day/15 Days ary Discharge Instructions: Apply nickel thick amount to wound bed as instructed Secondary Dressing: Woven Gauze Sponge, Non-Sterile 4x4 in (DME) (Generic) 1 x Per Day/15 Days Discharge Instructions: Apply over primary dressing as directed. Secondary Dressing: ABD Pad, 5x9 (DME) (Generic) 1 x Per Day/15 Days Discharge Instructions: Apply over primary dressing as directed. Secondary Dressing: Optifoam Non-Adhesive Dressing, 4x4 in (DME) (Generic) 1 x Per Day/15 Days Discharge Instructions: cut to make a foam donut and place over primary dresssing for offloading!! Secured With: Kerlix Roll Sterile, 4.5x3.1 (in/yd) (DME) (Generic) 1 x Per Day/15 Days Discharge Instructions: Secure with Kerlix as directed. Secured With: 3M Medipore H Soft Cloth Surgical T 4 x 2 (in/yd) (DME) (Generic) 1 x Per Day/15 Days ape Discharge Instructions: Secure dressing with tape as directed. 1. Santyl 2. Follow-up in 1 week Electronic Signature(s) Signed: 06/11/2021 5:20:14 PM By: Kalman Shan DO Entered By: Kalman Shan on 06/11/2021 17:19:36 -------------------------------------------------------------------------------- HxROS Details Patient Name: Date of Service: [name]. 06/11/2021 2:45 PM Medical Record Number: [id number] Patient Account Number: [id number] Date of Birth/Sex: Treating RN: 1946/12/04 (75 y.o. [name] Primary Care Provider: Yaakov Guthrie Other Clinician: Referring Provider: Treating Provider/Extender: Leigh Aurora in Treatment: 0 Information Obtained From Patient Constitutional Symptoms (General Health) Complaints and Symptoms: Negative for: Fatigue; Fever; Chills; Marked Weight Change Ear/Nose/Mouth/Throat Complaints and Symptoms: Negative for: Chronic sinus problems or rhinitis Gastrointestinal Complaints and Symptoms: Negative for: Frequent diarrhea; Nausea; Vomiting Medical History: Past Medical History Notes: ischemic colitis Endocrine Complaints and Symptoms: Negative for: Heat/cold intolerance Genitourinary Complaints and Symptoms: Negative for: Frequent urination Integumentary (Skin) Complaints and Symptoms: Positive for: Wounds - wound on right foot Psychiatric Complaints and Symptoms: Negative for: Claustrophobia; Suicidal Medical History: Negative for: Anorexia/bulimia; Confinement Anxiety Eyes Medical History: Positive for: Glaucoma - mild Negative for: Cataracts; Optic Neuritis Hematologic/Lymphatic Respiratory Medical History: Positive for: Chronic Obstructive Pulmonary Disease (COPD); Sleep Apnea - no CPAP Past Medical History  Notes: Hx lung cancer Cardiovascular Medical History: Positive for: Hypertension Past Medical History Notes: hypercholesteremia, 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 Immunological Musculoskeletal Medical History: Past Medical History Notes: hx polio as child Neurologic Medical History: Positive for: Neuropathy Oncologic Medical History: Positive for:  Received Chemotherapy; Received Radiation Past Medical History Notes: hx lung CA right and left lung, hx mouth CA HBO Extended History Items Eyes: Glaucoma Immunizations Pneumococcal Vaccine: Received Pneumococcal Vaccination: Yes Implantable Devices Yes Hospitalization / Surgery History Type of Hospitalization/Surgery left inguinal hernia repair lobectomy right and left lung mouth cancer surgery tonsilectomy Family and Social History Cancer: Yes - Mother; Diabetes: No; Heart Disease: Yes - Father,Siblings; Hereditary Spherocytosis: No; Hypertension: Yes - Mother,Father,Siblings; Kidney Disease: No; Lung Disease: No; Seizures: No; Stroke: No; Thyroid Problems: No; Tuberculosis: No; Former smoker - quit 2005; Marital Status - Married; Alcohol Use: Daily - bottle wine per day; Drug Use: No History; Caffeine Use: Daily - coffee; Financial Concerns: No; Food, Clothing or Shelter Needs: No; Support System Lacking: No; Transportation Concerns: No Electronic Signature(s) Signed: 06/11/2021 5:20:14 PM By: Kalman Shan DO Signed: 06/16/2021 5:39:04 PM By: Levan Hurst RN, BSN Entered By: Levan Hurst on 06/11/2021 14:59:24 -------------------------------------------------------------------------------- [place] Details Patient Name: Date of Service: [name]. 06/11/2021 Medical Record Number: [id number] Patient Account Number: [id number] Date of Birth/Sex: Treating RN: Feb 16, 1946 (75 y.o. 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 J44.9 Chronic obstructive pulmonary disease, unspecified G47.33 Obstructive sleep apnea (adult) (pediatric) Z86.718 Personal history of other venous thrombosis and  embolism Facility Procedures CPT4 Code: [id number] Description: 99214 - WOUND CARE VISIT-LEV 4 EST PT Modifier: Quantity: 1 Physician Procedures : CPT4 Code Description Modifier [id number] - WC PHYS LEVEL 3 - EST PT ICD-10 Diagnosis Description 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

## 2021-06-16 NOTE — Progress Notes (Signed)
Caleb, Everett ([id number]) Visit Report for 06/11/2021 Abuse/Suicide Risk Screen Details Patient Name: Date of Service: [name], [name] RD [name] 06/11/2021 2:45 PM Medical Record Number: [id number] Patient Account Number: [id number] Date of Birth/Sex: Treating RN: 1946/10/02 (75 y.o. [name] Primary Care Kipling Graser: Yaakov Guthrie Other Clinician: Referring Rajinder Mesick: Treating Corisa Montini/Extender: Seward Grater in Treatment: 0 Abuse/Suicide Risk Screen Items Answer ABUSE RISK SCREEN: Has anyone close to you tried to hurt or harm you recentlyo No Do you feel uncomfortable with anyone in your familyo No Has anyone forced you do things that you didnt want to doo No Electronic Signature(s) Signed: 06/16/2021 5:39:04 PM By: Levan Hurst RN, BSN Entered By: Levan Hurst on 06/11/2021 14:59:30 -------------------------------------------------------------------------------- Activities of Daily Living Details Patient Name: Date of Service: Caleb, Everett RD [name] 06/11/2021 2:45 PM Medical Record Number: [id number] Patient Account Number: [id number] Date of Birth/Sex: Treating RN: December 28, 1945 (75 y.o. [name] Primary Care Herb Beltre: Yaakov Guthrie Other Clinician: Referring Amere Bricco: Treating Kenzlee Fishburn/Extender: Seward Grater in Treatment: 0 Activities of Daily Living Items Answer Activities of Daily Living (Please select one for each item) Drive Automobile Completely Able T Medications ake Completely Able Use T elephone Completely Able Care for Appearance Completely Able Use T oilet Completely Able Bath / Shower Completely Able Dress Self Completely Able Feed Self Completely Able Walk Completely Able Get In / Out Bed Completely Able Housework Completely Able Prepare Meals Completely [place] for Self Completely Able Electronic Signature(s) Signed: 06/16/2021 5:39:04 PM By: Levan Hurst RN, BSN Entered  By: Levan Hurst on 06/11/2021 14:59:56 -------------------------------------------------------------------------------- Education Screening Details Patient Name: Date of Service: [name] RD C. 06/11/2021 2:45 PM Medical Record Number: [id number] Patient Account Number: [id number] Date of Birth/Sex: Treating RN: 1946-07-23 (75 y.o. [name] Primary Care [name]: Yaakov Guthrie Other Clinician: Referring [name]: Treating Elaf Clauson/Extender: Seward Grater in Treatment: 0 Primary Learner Assessed: Patient Learning Preferences/Education Level/Primary Language Learning Preference: Explanation, Demonstration, Printed Material Highest Education Level: College or Above Preferred Language: English Cognitive Barrier Language Barrier: No Translator Needed: No Memory Deficit: No Emotional Barrier: No Cultural/Religious Beliefs Affecting Medical Care: No Physical Barrier Impaired Vision: No Impaired Hearing: No Decreased Hand dexterity: No Knowledge/Comprehension Knowledge Level: High Comprehension Level: High Ability to understand written instructions: High Ability to understand verbal instructions: High Motivation Anxiety Level: Calm Cooperation: Cooperative Education Importance: Acknowledges Need Interest in Health Problems: Asks Questions Perception: Coherent Willingness to Engage in Self-Management High Activities: Readiness to Engage in Self-Management High Activities: Electronic Signature(s) Signed: 06/16/2021 5:39:04 PM By: Levan Hurst RN, BSN Entered By: Levan Hurst on 06/11/2021 15:00:23 -------------------------------------------------------------------------------- Fall Risk Assessment Details Patient Name: Date of Service: [name] RD C. 06/11/2021 2:45 PM Medical Record Number: [id number] Patient Account Number: [id number] Date of Birth/Sex: Treating RN: [birth year] (75 y.o. [name] Primary Care Sacoya Mcgourty:  Yaakov Guthrie Other Clinician: Referring [name]: Treating [name]/Extender: Seward Grater in Treatment: 0 Fall Risk Assessment Items Have you had 2 or more falls in the last 12 monthso 0 No Have you had any fall that resulted in injury in the last 12 monthso 0 No FALLS RISK SCREEN History of falling - immediate or within 3 months 0 No Secondary diagnosis (Do you have 2 or more medical diagnoseso) 15 Yes Ambulatory aid None/bed rest/wheelchair/nurse 0 Yes Crutches/cane/walker 0 No Furniture 0 No Intravenous therapy Access/Saline/Heparin Lock 0 No Gait/Transferring Normal/ bed rest/ wheelchair 0 Yes Weak (short steps  with or without shuffle, stooped but able to lift head while walking, may seek 0 No support from furniture) Impaired (short steps with shuffle, may have difficulty arising from chair, head down, impaired 0 No balance) Mental Status Oriented to own ability 0 Yes Electronic Signature(s) Signed: 06/16/2021 5:39:04 PM By: Levan Hurst RN, BSN Entered By: Levan Hurst on 06/11/2021 15:00:58 -------------------------------------------------------------------------------- Foot Assessment Details Patient Name: Date of Service: [name] RD C. 06/11/2021 2:45 PM Medical Record Number: [id number] Patient Account Number: [id number] Date of Birth/Sex: Treating RN: 1946-08-24 (75 y.o. [name] Primary Care [name]: Yaakov Guthrie Other Clinician: Referring [name]: Treating [name]/Extender: Seward Grater in Treatment: 0 Foot Assessment Items Site Locations + = Sensation present, - = Sensation absent, C = Callus, U = Ulcer R = Redness, W = Warmth, M = Maceration, PU = Pre-ulcerative lesion F = Fissure, S = Swelling, D = Dryness Assessment Right: Left: Other Deformity: No No Prior Foot Ulcer: No No Prior Amputation: No No Charcot Joint: No No Ambulatory Status: Ambulatory Without Help Gait:  Steady Electronic Signature(s) Signed: 06/16/2021 5:39:04 PM By: Levan Hurst RN, BSN Entered By: Levan Hurst on 06/11/2021 15:01:46 -------------------------------------------------------------------------------- Nutrition Risk Screening Details Patient Name: Date of Service: [name] RD C. 06/11/2021 2:45 PM Medical Record Number: [id number] Patient Account Number: [id number] Date of Birth/Sex: Treating RN: January 01, 1946 (74 y.o. [name] Primary Care Modelle Vollmer: Yaakov Guthrie Other Clinician: Referring Chistine Dematteo: Treating Kaled Allende/Extender: Seward Grater in Treatment: 0 Height (in): 69 Weight (lbs): 210 Body Mass Index (BMI): 31 Nutrition Risk Screening Items Score Screening NUTRITION RISK SCREEN: I have an illness or condition that made me change the kind and/or amount of food I eat 0 No I eat fewer than two meals per day 0 No I eat few fruits and vegetables, or milk products 0 No I have three or more drinks of beer, liquor or wine almost every day 0 No I have tooth or mouth problems that make it hard for me to eat 0 No I don't always have enough money to buy the food I need 0 No I eat alone most of the time 0 No I take three or more different prescribed or over-the-counter drugs a day 1 Yes Without wanting to, I have lost or gained 10 pounds in the last six months 0 No I am not always physically able to shop, cook and/or feed myself 0 No Nutrition Protocols Good Risk Protocol Moderate Risk Protocol 0 Provide education on nutrition High Risk Proctocol Risk Level: Good Risk Score: 1 Electronic Signature(s) Signed: 06/16/2021 5:39:04 PM By: Levan Hurst RN, BSN Entered By: Levan Hurst on 06/11/2021 15:01:18

## 2021-06-17 ENCOUNTER — Other Ambulatory Visit: Payer: Self-pay

## 2021-06-17 ENCOUNTER — Encounter (HOSPITAL_BASED_OUTPATIENT_CLINIC_OR_DEPARTMENT_OTHER): Payer: Medicare Other | Admitting: Internal Medicine

## 2021-06-17 DIAGNOSIS — L97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 with unspecified severity: Secondary | ICD-10-CM | POA: Diagnosis not present

## 2021-06-17 NOTE — Progress Notes (Addendum)
BLADYN, TIPPS ([id number]) Visit Report for 06/17/2021 Debridement Details Patient Name: Date of Service: [name], Caleb RD C. 06/17/2021 1:00 PM Medical Record Number: [id number] Patient Account Number: [id number] Date of Birth/Sex: Treating RN: 05/25/1946 (75 y.o. [name] Primary Care Provider: Yaakov Guthrie Other Clinician: Referring Provider: Treating Provider/Extender: [name] in Treatment: 0 Debridement Performed for Assessment: Wound #4 Right,Lateral Foot Performed By: Physician Ricard Dillon., MD Debridement Type: Debridement Level of Consciousness (Pre-procedure): Awake and Alert Pre-procedure Verification/Time Out Yes - 13:40 Taken: Start Time: 13:40 T Area Debrided (L x W): otal 1.8 (cm) x 2.8 (cm) = 5.04 (cm) Tissue and other material debrided: Viable, Non-Viable, Slough, Subcutaneous, Slough Level: Skin/Subcutaneous Tissue Debridement Description: Excisional Instrument: Curette Bleeding: Minimum Hemostasis Achieved: Pressure End Time: 13:41 Procedural Pain: 0 Post Procedural Pain: 0 Response to Treatment: Procedure was tolerated well Level of Consciousness (Post- Awake and Alert procedure): Post Debridement Measurements of Total Wound Length: (cm) 1.8 Width: (cm) 2.8 Depth: (cm) 0.1 Volume: (cm) 0.396 Character of Wound/Ulcer Post Debridement: Requires Further Debridement Post Procedure Diagnosis Same as Pre-procedure Electronic Signature(s) Signed: 06/17/2021 4:56:05 PM By: Levan Hurst RN, BSN Signed: 06/17/2021 4:58:19 PM By: Linton Ham MD Entered By: Linton Ham on 06/17/2021 13:51:10 -------------------------------------------------------------------------------- HPI Details Patient Name: Date of Service: [name] RD C. 06/17/2021 1:00 PM Medical Record Number: [id number] Patient Account Number: [id number] Date of Birth/Sex: Treating RN: 11-Nov-1946 (75 y.o. 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 He states this started a little over 2 months ago. This is now the third time he has developed a wound to this area. He followed with podiatry for this issue and has been using Betadine daily to it. He recently switched to silver alginate a week ago. He has been on doxycycline since May For this issue and just recently started a course of Keflex prescribed in the ED on 6/18 For possible cellulitis. Currently denies signs of infection. He uses a front offloading shoe to help with relieving the pressure associated with the wound formation. He reports minimal drainage. He does not have sensation to his feet and denies any pain. Currently denies signs of infection. 6/29; this is a patient with chemotherapy-induced neuropathy. We had in clinic last year he was readmitted to our clinic last week. He has an area on the right fifth metatarsal head. Dr. Heber Tracyton admitted to the to the clinic last week started him on Santyl they are using this daily. The wound started in March she went to podiatry. They are using Betadine to this I am not really sure about the benefit of Betadine in this type of situation. There is not appear to be any active infection. He is using a forefoot offloading boot Electronic Signature(s) Signed: 06/19/2021 3:23:08 PM By: Maye Hides Signed: 06/23/2021 6:06:29 PM By: Linton Ham MD Previous Signature: 06/17/2021 4:58:19 PM Version By: Linton Ham MD Entered By: Maye Hides on 06/19/2021 15:23:07 -------------------------------------------------------------------------------- Physical Exam  Details Patient Name: Date of Service: [name] RD C. 06/17/2021 1:00 PM Medical Record Number: [id number] Patient Account Number: [id number] Date of Birth/Sex: Treating RN: Feb 20, 1946 (75 y.o. [name] Primary Care Provider:  Yaakov Guthrie Other Clinician: Referring Provider: Treating Provider/Extender: [name] in Treatment: 0 Constitutional Sitting or standing Blood Pressure is within target range for patient.. Pulse regular and within target range for patient.Marland Kitchen Respirations regular, non-labored and within target range.. Temperature is normal and within the target range for the patient.Marland Kitchen Appears in no distress. Notes Wound exam; right foot plantar fifth metatarsal head. This has no depth but a very adherent film over the surface of this I used a #5 curette to do debridement. There is no evidence of infection. Electronic Signature(s) Signed: 06/17/2021 4:58:19 PM By: Linton Ham MD Entered By: Linton Ham on 06/17/2021 13:58:51 -------------------------------------------------------------------------------- Physician Orders Details Patient Name: Date of Service: [name] RD C. 06/17/2021 1:00 PM Medical Record Number: [id number] Patient Account Number: [id number] Date of Birth/Sex: Treating RN: [birth year] (75 y.o. 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 - with Dr. Dellia Nims Return A Bathing/ Shower/ Hygiene May shower with  protection but do not get wound dressing(s) wet. Edema Control - Lymphedema / SCD / Other Elevate legs to the level of the heart or above for 30 minutes daily and/or when sitting, a frequency of: - throughout the day Avoid standing for long periods of time. Wound Treatment Wound #4 - Foot Wound Laterality: Right, Lateral Cleanser: Soap and Water 1 x Per GEZ/66 Days Discharge Instructions: May shower and wash wound with dial antibacterial soap and water prior to dressing change. Cleanser: Wound Cleanser (Generic) 1 x Per Day/15 Days Discharge Instructions: Cleanse the wound with wound cleanser prior to applying a clean dressing using gauze sponges, not tissue or cotton balls. Prim Dressing: Santyl Ointment 1 x Per Day/15 Days ary Discharge Instructions: Apply nickel thick amount to wound bed as instructed Secondary Dressing: Woven Gauze Sponge, Non-Sterile 4x4 in (Generic) 1 x Per Day/15 Days Discharge Instructions: Apply over primary dressing as directed. Secondary Dressing: Optifoam Non-Adhesive Dressing, 4x4 in (Generic) 1 x Per Day/15 Days Discharge Instructions: cut to make a foam donut and place over primary dresssing for offloading Secured With: Kerlix Roll Sterile, 4.5x3.1 (in/yd) (Generic) 1 x Per Day/15 Days Discharge Instructions: Secure with Kerlix as directed. Secured With: 44M Medipore H Soft Cloth Surgical T 4 x 2 (in/yd) (Generic) 1 x Per Day/15 Days ape Discharge Instructions: Secure dressing with tape as directed. Electronic Signature(s) Signed: 06/17/2021 4:56:05 PM By: Levan Hurst RN, BSN Signed: 06/17/2021 4:58:19 PM By: Linton Ham MD Entered By: Levan Hurst on 06/17/2021 13:45:32 -------------------------------------------------------------------------------- Problem List Details Patient Name: Date of Service: [name] RD C. 06/17/2021 1:00 PM Medical Record Number: [id number] Patient Account Number: [id number] Date of Birth/Sex: Treating RN: February 25, 1946  (75 y.o. 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 06/17/2021 1:00 PM Medical Record Number: [id number] Patient Account Number: [id number] Date of Birth/Sex: Treating RN: 1946/12/17 (75 y.o. 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 He states this started a little over 2 months ago. This is now the third time he has developed a wound to this area. He followed with podiatry for this issue and has been using Betadine daily to it. He recently switched to silver alginate a week ago. He has been on doxycycline since May For this issue and just recently started a course of Keflex prescribed in the ED on 6/18 For possible cellulitis. Currently denies signs of infection. He  uses a front offloading shoe to help with relieving the pressure associated with the wound formation. He reports minimal drainage. He does not have sensation to his feet and denies any pain. Currently denies signs of infection. 6/29; this is a patient with chemotherapy-induced neuropathy. We had in clinic last year he was readmitted to our clinic last week. He has an area on the right fifth metatarsal head. Dr. Heber Belspring admitted to the to the clinic last week started him on Santyl they are using this daily. The wound started in March she went to podiatry. They are using Betadine to this I am not really sure about the benefit of Betadine in this type of situation. There is not appear to be any active infection. He is using a forefoot offloading boot Objective Constitutional Sitting or standing Blood Pressure is within target range for patient.. Pulse regular and within target range for patient.Marland Kitchen Respirations regular, non-labored and within target range.. Temperature is normal and within the target range for the patient.Marland Kitchen Appears in no distress. Vitals Time Taken: 1:06 PM, Height: 69 in, Weight: 210 lbs, BMI: 31, Temperature: 98.1 F, Pulse: 98 bpm, Respiratory Rate: 17 breaths/min, Blood Pressure: 138/84 mmHg. General Notes: Wound exam; right foot plantar fifth metatarsal head. This has no depth but a very adherent film over the surface of this I used a #5 curette to do debridement. There is no evidence of infection. Integumentary (Hair, Skin) Wound #4 status is Open. Original cause of wound was Gradually Appeared. The date acquired was: 03/30/2021. The wound is located on the Right,Lateral Foot. The wound measures 1.8cm length x 2.8cm width x 0.1cm depth; 3.958cm^2 area and 0.396cm^3 volume. There is Fat Layer (Subcutaneous Tissue) exposed. There is no tunneling or undermining noted. There is a medium amount of serosanguineous drainage noted. The wound margin is flat and intact. There is medium  (34-66%) pink granulation within the wound bed. There is a medium (34-66%) amount of necrotic tissue within the wound bed including Adherent Slough. 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 There was a Excisional Skin/Subcutaneous  Tissue Debridement with a total area of 5.04 sq cm performed by Ricard Dillon., MD. With the following instrument(s): Curette to remove Viable and Non-Viable tissue/material. Material removed includes Subcutaneous Tissue and Slough and. No specimens were taken. A time out was conducted at 13:40, prior to the start of the procedure. A Minimum amount of bleeding was controlled with Pressure. The procedure was tolerated well with a pain level of 0 throughout and a pain level of 0 following the procedure. Post Debridement Measurements: 1.8cm length x 2.8cm width x 0.1cm depth; 0.396cm^3 volume. Character of Wound/Ulcer Post Debridement requires further debridement. Post procedure Diagnosis Wound #4: Same as Pre-Procedure Plan Follow-up Appointments: Return Appointment in 1 week. - with Dr. Arcola Jansky Shower/ Hygiene: May shower with protection but do not get wound dressing(s) wet. Edema Control - Lymphedema / SCD / Other: Elevate legs to the level of the heart or above for 30 minutes daily and/or when sitting, a frequency of: - throughout the day Avoid standing for long periods of time. WOUND #4: - Foot Wound Laterality: Right, Lateral Cleanser: Soap and Water 1 x Per BJY/78 Days Discharge Instructions: May shower and wash wound with dial antibacterial soap and water prior to dressing change. Cleanser: Wound  Cleanser (Generic) 1 x Per Day/15 Days Discharge Instructions: Cleanse the wound with wound cleanser prior to applying a clean dressing using gauze sponges, not tissue or cotton balls. Prim Dressing: Santyl Ointment 1 x Per Day/15 Days ary Discharge Instructions: Apply nickel thick amount to wound bed as instructed Secondary Dressing: Woven Gauze Sponge, Non-Sterile 4x4 in (Generic) 1 x Per Day/15 Days Discharge Instructions: Apply over primary dressing as directed. Secondary Dressing: Optifoam Non-Adhesive Dressing, 4x4 in (Generic) 1 x Per Day/15 Days Discharge Instructions: cut to make a foam donut and place over primary dresssing for offloading Secured With: Kerlix Roll Sterile, 4.5x3.1 (in/yd) (Generic) 1 x Per Day/15 Days Discharge Instructions: Secure with Kerlix as directed. Secured With: 84M Medipore H Soft Cloth Surgical T 4 x 2 (in/yd) (Generic) 1 x Per Day/15 Days ape Discharge Instructions: Secure dressing with tape as directed. 1. I am continuing with Santyl for another week 2. I suspect he will need continual mechanical debridements for at least the next 2 weeks 3. They are asking me about a holiday they are having in mid August. I told him it was I think a bit presumptuous to think he will not be healed by then although I understand their concerns. 4. May need a total contact cast at some point Electronic Signature(s) Signed: 06/17/2021 4:58:19 PM By: Linton Ham MD Entered By: Linton Ham on 06/17/2021 14:01:36 -------------------------------------------------------------------------------- SuperBill Details Patient Name: Date of Service: [name] RD C. 06/17/2021 Medical Record Number: [id number] Patient Account Number: [id number] Date of Birth/Sex: Treating RN: 02-10-1946 (75 y.o. 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

## 2021-06-17 NOTE — Progress Notes (Signed)
HASSEN, [name] ([id number]) Visit Report for 06/17/2021 Arrival Information Details Patient Name: Date of Service: [name], Caleb RD C. 06/17/2021 1:00 PM Medical Record Number: [id number] Patient Account Number: [id number] Date of Birth/Sex: Treating RN: 1946/02/18 (75 y.o. [name], [name] Primary Care Domanique Luckett: Yaakov Guthrie Other Clinician: Referring Rayshaun Needle: Treating Shineka Auble/Extender: [name] in Treatment: 0 Visit Information History Since Last Visit Added or deleted any medications: No Patient Arrived: Ambulatory Any new allergies or adverse reactions: No Arrival Time: 13:04 Had a fall or experienced change in No Accompanied By: wif activities of daily living that may affect Transfer Assistance: None risk of falls: Patient Identification Verified: Yes Signs or symptoms of abuse/neglect since last visito No Secondary Verification Process Completed: Yes Hospitalized since last visit: No Patient Requires Transmission-Based Precautions: No Implantable device outside of the clinic excluding No Patient Has Alerts: Yes cellular tissue based products placed in the center Patient Alerts: Patient on Blood Thinner since last visit: R ABI: 1.01 TBI: 0.77 Has Dressing in Place as Prescribed: Yes L ABI: 1.22 TBI: 0.87 Pain Present Now: No 05/01/21 Electronic Signature(s) Signed: 06/17/2021 6:02:58 PM By: Rhae Hammock RN Entered By: Rhae Hammock on 06/17/2021 13:06:51 -------------------------------------------------------------------------------- Encounter Discharge Information Details Patient Name: Date of Service: [name] RD C. 06/17/2021 1:00 PM Medical Record Number: [id number] Patient Account Number: [id number] Date of Birth/Sex: Treating RN: [birth year] (75 y.o. Hessie Diener Primary Care Isiaah Cuervo: Yaakov Guthrie Other Clinician: Referring Zeena Starkel: Treating [employer] Teegarden/Extender: [name] in Treatment:  0 Encounter Discharge Information Items Post Procedure Vitals Discharge Condition: Stable Temperature (F): 98.1 Ambulatory Status: Ambulatory Pulse (bpm): 98 Discharge Destination: Home Respiratory Rate (breaths/min): 17 Transportation: Private Auto Blood Pressure (mmHg): 138/84 Accompanied By: wife Schedule Follow-up Appointment: Yes Clinical Summary of Care: Electronic Signature(s) Signed: 06/17/2021 6:10:10 PM By: Deon Pilling Entered By: Deon Pilling on 06/17/2021 14:54:25 -------------------------------------------------------------------------------- Lower Extremity Assessment Details Patient Name: Date of Service: [name], Caleb RD C. 06/17/2021 1:00 PM Medical Record Number: [id number] Patient Account Number: [id number] Date of Birth/Sex: Treating RN: 01-29-1946 (75 y.o. [name], [name] Primary Care Kmari Brian: Yaakov Guthrie Other Clinician: Referring Lyn Deemer: Treating Almarosa Bohac/Extender: [name] in Treatment: 0 Edema Assessment Assessed: Shirlyn Goltz: No] Patrice Paradise: Yes] Edema: [Left: Ye] [Right: s] Calf Left: Right: Point of Measurement: 34 cm From Medial Instep 40.5 cm Ankle Left: Right: Point of Measurement: 13 cm From Medial Instep 24 cm Vascular Assessment Pulses: Dorsalis Pedis Palpable: [Right:Yes] Posterior Tibial Palpable: [Right:Yes] Electronic Signature(s) Signed: 06/17/2021 6:02:58 PM By: Rhae Hammock RN Entered By: Rhae Hammock on 06/17/2021 13:13:42 -------------------------------------------------------------------------------- Multi Wound Chart Details Patient Name: Date of Service: [name] RD C. 06/17/2021 1:00 PM Medical Record Number: [id number] Patient Account Number: [id number] Date of Birth/Sex: Treating RN: 1946-06-01 (75 y.o. 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] [N/A:N/A] Primary Etiology: [4:Glaucoma, Chronic Obstructive] [N/A:N/A] Comorbid History: [4:Pulmonary Disease (COPD), Sleep Apnea, Hypertension, Neuropathy, Received Chemotherapy, Received Radiation 03/30/2021] [N/A:N/A] Date Acquired: [4:0] [N/A:N/A] Weeks of Treatment: [4:Open] [N/A:N/A] Wound Status: [4:1.8x2.8x0.1] [N/A:N/A] Measurements L x W x D (cm) [4:3.958] [N/A:N/A] A (cm) : rea [4:0.396] [N/A:N/A] Volume (cm) : [4:28.00%] [N/A:N/A] % Reduction in A [4:rea: 28.00%] [N/A:N/A] %  Reduction in Volume: [4:Full Thickness Without Exposed] [N/A:N/A] Classification: [4:Support Structures Medium] [N/A:N/A] Exudate A mount: [4:Serosanguineous] [N/A:N/A] Exudate Type: [4:red, brown] [N/A:N/A] Exudate Color: [4:Flat and Intact] [N/A:N/A] Wound Margin: [4:Medium (34-66%)] [N/A:N/A] Granulation A mount: [4:Pink] [N/A:N/A] Granulation Quality: [4:Medium (34-66%)] [N/A:N/A] Necrotic A mount: [4:Fat Layer (Subcutaneous Tissue): Yes N/A] Exposed Structures: [4:Fascia: No Tendon: No Muscle: No Joint: No Bone: No Small (1-33%)] [N/A:N/A] Epithelialization: [4:Debridement - Excisional] [N/A:N/A] Debridement: Pre-procedure Verification/Time Out 13:40 [N/A:N/A] Taken: [4:Subcutaneous, Slough] [N/A:N/A] Tissue Debrided: [4:Skin/Subcutaneous Tissue] [N/A:N/A] Level: [4:5.04] [N/A:N/A] Debridement A (sq cm): [4:rea Curette] [N/A:N/A] Instrument: [4:Minimum] [N/A:N/A] Bleeding: [4:Pressure] [N/A:N/A] Hemostasis A chieved: [4:0] [N/A:N/A] Procedural Pain: [4:0] [N/A:N/A] Post Procedural Pain: [4:Procedure was tolerated well] [N/A:N/A] Debridement Treatment Response:  [4:1.8x2.8x0.1] [N/A:N/A] Post Debridement Measurements L x W x D (cm) [4:0.396] [N/A:N/A] Post Debridement Volume: (cm) [4:Debridement] [N/A:N/A] Treatment Notes Electronic Signature(s) Signed: 06/17/2021 4:56:05 PM By: Levan Hurst RN, BSN Signed: 06/17/2021 4:58:19 PM By: Linton Ham MD Entered By: Linton Ham on 06/17/2021 13:50:50 -------------------------------------------------------------------------------- Multi-Disciplinary Care Plan Details Patient Name: Date of Service: [name] RD C. 06/17/2021 1:00 PM Medical Record Number: [id number] Patient Account Number: [id number] Date of Birth/Sex: Treating RN: 01/02/1946 (75 y.o. [name] Primary Care Maedell Hedger: Yaakov Guthrie Other Clinician: Referring Collier Bohnet: Treating Eurika Sandy/Extender: [name] in Treatment: 0 Active Inactive Wound/Skin Impairment Nursing Diagnoses: Impaired tissue integrity Goals: Patient will have a decrease in wound volume by X% from date: (specify in notes) Date Initiated: 06/11/2021 Target Resolution Date: 06/25/2021 Goal Status: Active Patient/caregiver will verbalize understanding of skin care regimen Date Initiated: 06/11/2021 Target Resolution Date: 06/25/2021 Goal Status: Active Ulcer/skin breakdown will have a volume reduction of 30% by week 4 Date Initiated: 06/11/2021 Target Resolution Date: 06/25/2021 Goal Status: Active Interventions: Assess patient/caregiver ability to obtain necessary supplies Assess patient/caregiver ability to perform ulcer/skin care regimen upon admission and as needed Assess ulceration(s) every visit Notes: Electronic Signature(s) Signed: 06/17/2021 4:56:05 PM By: Levan Hurst RN, BSN Entered By: Levan Hurst on 06/17/2021 13:47:03 -------------------------------------------------------------------------------- Pain Assessment Details Patient Name: Date of Service: [name] RD C. 06/17/2021 1:00 PM Medical  Record Number: [id number] Patient Account Number: [id number] Date of Birth/Sex: Treating RN: Feb 05, 1946 (75 y.o. [name], [name] Primary Care Simone Rodenbeck: Yaakov Guthrie Other Clinician: Referring Shantara Goosby: Treating Claiborne Stroble/Extender: [name] in Treatment: 0 Active Problems Location of Pain Severity and Description of Pain Patient Has Paino No Site Locations Pain Management and Medication Current Pain Management: Electronic Signature(s) Signed: 06/17/2021 6:02:58 PM By: Rhae Hammock RN Entered By: Rhae Hammock on 06/17/2021 13:09:27 -------------------------------------------------------------------------------- Patient/Caregiver Education Details Patient Name: Date of Service: [name] RD C. 6/29/2022andnbsp1:00 PM Medical Record Number: [id number] Patient Account Number: [id number] Date of Birth/Gender: Treating RN: 1946/05/20 (75 y.o. [name] Primary Care Physician: Yaakov Guthrie Other Clinician: Referring Physician: Treating Physician/Extender: [name] in Treatment: 0 Education Assessment Education Provided To: Patient Education Topics Provided Wound/Skin Impairment: Methods: Explain/Verbal Responses: State content correctly Electronic Signature(s) Signed: 06/17/2021 4:56:05 PM By: Levan Hurst RN, BSN Entered By: Levan Hurst on 06/17/2021 13:47:20 -------------------------------------------------------------------------------- Wound Assessment Details Patient Name: Date of Service: [name] RD C. 06/17/2021 1:00 PM Medical Record Number: [id number] Patient Account Number: [id number] Date of Birth/Sex: Treating RN: 26-Jun-1946 (75 y.o. [name] Primary Care Toniann Dickerson: Yaakov Guthrie Other Clinician: Referring Cris Talavera: Treating Mylia Pondexter/Extender: [name] in Treatment: 0 Wound Status Wound Number: 4 Primary Neuropathic Ulcer-Non  Diabetic Etiology: Wound Location: Right, Lateral Foot Wound  Open Wounding Event: Gradually Appeared Status: Date Acquired: 03/30/2021 Comorbid Glaucoma, Chronic Obstructive Pulmonary Disease (COPD), Sleep Weeks Of Treatment: 0 History: Apnea, Hypertension, Neuropathy, Received Chemotherapy, Clustered Wound: No Received Radiation Photos Wound Measurements Length: (cm) 1.8 Width: (cm) 2.8 Depth: (cm) 0.1 Area: (cm) 3.958 Volume: (cm) 0.396 % Reduction in Area: 28% % Reduction in Volume: 28% Epithelialization: Small (1-33%) Tunneling: No Undermining: No Wound Description Classification: Full Thickness Without Exposed Support Structures Wound Margin: Flat and Intact Exudate Amount: Medium Exudate Type: Serosanguineous Exudate Color: red, brown Foul Odor After Cleansing: No Slough/Fibrino Yes Wound Bed Granulation Amount: Medium (34-66%) Exposed Structure Granulation Quality: Pink Fascia Exposed: No Necrotic Amount: Medium (34-66%) Fat Layer (Subcutaneous Tissue) Exposed: Yes Necrotic Quality: Adherent Slough Tendon Exposed: No Muscle Exposed: No Joint Exposed: No Bone Exposed: No Treatment Notes Wound #4 (Foot) Wound Laterality: Right, Lateral Cleanser Soap and Water Discharge Instruction: May shower and wash wound with dial antibacterial soap and water prior to dressing change. Wound Cleanser Discharge Instruction: Cleanse the wound with wound cleanser prior to applying a clean dressing using gauze sponges, not tissue or cotton balls. Peri-Wound Care Topical Primary Dressing Santyl Ointment Discharge Instruction: Apply nickel thick amount to wound bed as instructed Secondary Dressing Woven Gauze Sponge, Non-Sterile 4x4 in Discharge Instruction: Apply over primary dressing as directed. Optifoam Non-Adhesive Dressing, 4x4 in Discharge Instruction: cut to make a foam donut and place over primary dresssing for offloading Secured With [employer] Sterile, 4.5x3.1  (in/yd) Discharge Instruction: Secure with Kerlix as directed. 58M Medipore H Soft Cloth Surgical T 4 x 2 (in/yd) ape Discharge Instruction: Secure dressing with tape as directed. Compression Wrap Compression Stockings Add-Ons Electronic Signature(s) Signed: 06/17/2021 5:02:46 PM By: Sandre Kitty Signed: 06/17/2021 6:02:58 PM By: Rhae Hammock RN Entered By: Sandre Kitty on 06/17/2021 16:38:25 -------------------------------------------------------------------------------- Vitals Details Patient Name: Date of Service: [name] RD C. 06/17/2021 1:00 PM Medical Record Number: [id number] Patient Account Number: [id number] Date of Birth/Sex: Treating RN: 1946-09-24 (75 y.o. [name], [name] Primary Care Winthrop Shannahan: Yaakov Guthrie Other Clinician: Referring Ceciley Buist: Treating Annalisa Colonna/Extender: [name] in Treatment: 0 Vital Signs Time Taken: 13:06 Temperature (F): 98.1 Height (in): 69 Pulse (bpm): 98 Weight (lbs): 210 Respiratory Rate (breaths/min): 17 Body Mass Index (BMI): 31 Blood Pressure (mmHg): 138/84 Reference Range: 80 - 120 mg / dl Electronic Signature(s) Signed: 06/17/2021 6:02:58 PM By: Rhae Hammock RN Entered By: Rhae Hammock on 06/17/2021 13:09:07

## 2021-06-24 ENCOUNTER — Encounter (HOSPITAL_BASED_OUTPATIENT_CLINIC_OR_DEPARTMENT_OTHER): Payer: Medicare Other | Attending: Internal Medicine | Admitting: Internal Medicine

## 2021-06-24 ENCOUNTER — Other Ambulatory Visit: Payer: Self-pay

## 2021-06-24 DIAGNOSIS — Z85118 Personal history of other malignant neoplasm of bronchus and lung: Secondary | ICD-10-CM | POA: Diagnosis not present

## 2021-06-24 DIAGNOSIS — L97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 with unspecified severity: Secondary | ICD-10-CM | POA: Insufficient documentation

## 2021-06-24 DIAGNOSIS — I10 Essential (primary) hypertension: Secondary | ICD-10-CM | POA: Insufficient documentation

## 2021-06-24 DIAGNOSIS — L03116 Cellulitis of left lower limb: Secondary | ICD-10-CM | POA: Diagnosis not present

## 2021-06-24 DIAGNOSIS — Z9221 Personal history of antineoplastic chemotherapy: Secondary | ICD-10-CM | POA: Diagnosis not present

## 2021-06-24 DIAGNOSIS — J449 Chronic obstructive pulmonary disease, unspecified: Secondary | ICD-10-CM | POA: Insufficient documentation

## 2021-06-24 DIAGNOSIS — G620 Drug-induced polyneuropathy: Secondary | ICD-10-CM | POA: Diagnosis not present

## 2021-06-24 DIAGNOSIS — Z86718 Personal history of other venous thrombosis and embolism: Secondary | ICD-10-CM | POA: Insufficient documentation

## 2021-06-24 DIAGNOSIS — G4733 Obstructive sleep apnea (adult) (pediatric): Secondary | ICD-10-CM | POA: Insufficient documentation

## 2021-06-24 NOTE — Progress Notes (Signed)
[name], [name] ([id number]) Visit Report for 06/24/2021 Arrival Information Details Patient Name: Date of Service: [name], Caleb RD C. 06/24/2021 11:15 A M Medical Record Number: [id number] Patient Account Number: [id number] Date of Birth/Sex: Treating RN: February 03, 1946 (75 y.o. [name] Primary Care Myli Pae: Yaakov Guthrie Other Clinician: Referring Escarlet Saathoff: Treating Lynnlee Revels/Extender: [name] in Treatment: 1 Visit Information History Since Last Visit Added or deleted any medications: No Patient Arrived: Ambulatory Any new allergies or adverse reactions: No Arrival Time: 11:23 Had a fall or experienced change in No Accompanied By: wife activities of daily living that may affect Transfer Assistance: None risk of falls: Patient Identification Verified: Yes Signs or symptoms of abuse/neglect since last visito No Secondary Verification Process Completed: Yes Hospitalized since last visit: No Patient Requires Transmission-Based Precautions: No Implantable device outside of the clinic excluding No Patient Has Alerts: Yes cellular tissue based products placed in the center Patient Alerts: Patient on Blood Thinner since last visit: R ABI: 1.01 TBI: 0.77 Has Dressing in Place as Prescribed: Yes L ABI: 1.22 TBI: 0.87 Pain Present Now: No 05/01/21 Electronic Signature(s) Signed: 06/24/2021 5:22:20 PM By: Sandre Kitty Entered By: Sandre Kitty on 06/24/2021 11:23:34 -------------------------------------------------------------------------------- Lower Extremity Assessment Details Patient Name: Date of Service: [name], Caleb RD C. 06/24/2021 11:15 A M Medical Record Number: [id number] Patient Account Number: [id number] Date of Birth/Sex: Treating RN: 1946/04/08 (75 y.o. Hessie Diener Primary Care Briley Bumgarner: Yaakov Guthrie Other Clinician: Referring Yi Falletta: Treating Sahiti Joswick/Extender: [name] in Treatment: 1 Edema  Assessment Assessed: Shirlyn Goltz: No] Patrice Paradise: Yes] Edema: [Left: Ye] [Right: s] Calf Left: Right: Point of Measurement: 34 cm From Medial Instep 39 cm Ankle Left: Right: Point of Measurement: 13 cm From Medial Instep 24.5 cm Vascular Assessment Pulses: Dorsalis Pedis Palpable: [Right:Yes] Electronic Signature(s) Signed: 06/24/2021 6:50:03 PM By: Deon Pilling Entered By: Deon Pilling on 06/24/2021 11:39:34 -------------------------------------------------------------------------------- Multi Wound Chart Details Patient Name: Date of Service: Caleb Alken RD C. 06/24/2021 11:15 A M Medical Record Number: [id number] Patient Account Number: [id number] Date of Birth/Sex: Treating RN: February 13, 1946 (75 y.o. 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] [N/A:N/A] Primary Etiology: [4:Glaucoma, Chronic Obstructive] [N/A:N/A] Comorbid History: [4:Pulmonary Disease (COPD), Sleep Apnea, Hypertension, Neuropathy, Received Chemotherapy, Received Radiation 03/30/2021] [N/A:N/A] Date Acquired: [4:1] [N/A:N/A] Weeks of Treatment: [4:Open] [N/A:N/A] Wound Status: [4:1.5x2.2x0.1] [N/A:N/A] Measurements L x W x D (cm) [4:2.592] [N/A:N/A] A (cm) : rea [4:0.259] [N/A:N/A] Volume (cm) : [4:52.90%] [N/A:N/A] % Reduction in A [4:rea: 52.90%] [N/A:N/A] % Reduction in Volume: [4:Full Thickness Without Exposed] [N/A:N/A] Classification: [4:Support Structures Medium] [N/A:N/A] Exudate A mount: [4:Serosanguineous] [N/A:N/A] Exudate Type: [4:red, brown]  [N/A:N/A] Exudate Color: [4:Flat and Intact] [N/A:N/A] Wound Margin: [4:Large (67-100%)] [N/A:N/A] Granulation A mount: [4:Pink] [N/A:N/A] Granulation Quality: [4:Small (1-33%)] [N/A:N/A] Necrotic A mount: [4:Fat Layer (Subcutaneous Tissue): Yes N/A] Exposed Structures: [4:Fascia: No Tendon: No Muscle: No Joint: No Bone: No Small (1-33%)] [N/A:N/A] Epithelialization: [4:Debridement - Selective/Open Wound N/A] Debridement: Pre-procedure Verification/Time Out 11:51 [N/A:N/A] Taken: [4:Non-Viable Tissue] [N/A:N/A] Level: [4:3.3] [N/A:N/A] Debridement A (sq cm): [4:rea Curette] [N/A:N/A] Instrument: [4:Moderate] [N/A:N/A] Bleeding: [4:Silver Nitrate] [N/A:N/A] Hemostasis Achieved: [4:0] [N/A:N/A] Procedural Pain: [4:0] [N/A:N/A] Post Procedural Pain: [4:Procedure was tolerated well] [N/A:N/A] Debridement  Treatment Response: [4:1.5x2.2x0.1] [N/A:N/A] Post Debridement Measurements L x W x D (cm) [4:0.259] [N/A:N/A] Post Debridement Volume: (cm) [4:Debridement] [N/A:N/A] Procedures Performed: Treatment Notes Electronic Signature(s) Signed: 06/24/2021 5:13:41 PM By: Linton Ham MD Signed: 06/24/2021 7:11:13 PM By: Levan Hurst RN, BSN Entered By: Linton Ham on 06/24/2021 12:27:50 -------------------------------------------------------------------------------- Multi-Disciplinary Care Plan Details Patient Name: Date of Service: Caleb Alken RD C. 06/24/2021 11:15 A M Medical Record Number: [id number] Patient Account Number: [id number] Date of Birth/Sex: Treating RN: [birth year] (75 y.o. [name] Primary Care Alesia Oshields: Yaakov Guthrie Other Clinician: Referring Willia Lampert: Treating Margarine Grosshans/Extender: [name] in Treatment: 1 Active Inactive Wound/Skin Impairment Nursing Diagnoses: Impaired tissue integrity Goals: Patient/caregiver will verbalize understanding of skin care regimen Date Initiated: 06/11/2021 Target Resolution Date:  07/10/2021 Goal Status: Active Ulcer/skin breakdown will have a volume reduction of 30% by week 4 Date Initiated: 06/11/2021 Target Resolution Date: 07/10/2021 Goal Status: Active Interventions: Assess patient/caregiver ability to obtain necessary supplies Assess patient/caregiver ability to perform ulcer/skin care regimen upon admission and as needed Assess ulceration(s) every visit Notes: Electronic Signature(s) Signed: 06/24/2021 7:11:13 PM By: Levan Hurst RN, BSN Entered By: Levan Hurst on 06/24/2021 18:21:56 -------------------------------------------------------------------------------- Pain Assessment Details Patient Name: Date of Service: Caleb Alken RD C. 06/24/2021 11:15 A M Medical Record Number: [id number] Patient Account Number: [id number] Date of Birth/Sex: Treating RN: 1946-04-12 (75 y.o. [name] Primary Care Aletha Allebach: Yaakov Guthrie Other Clinician: Referring Kemper Heupel: Treating Antonia Jicha/Extender: [name] in Treatment: 1 Active Problems Location of Pain Severity and Description of Pain Patient Has Paino No Site Locations Pain Management and Medication Current Pain Management: Electronic Signature(s) Signed: 06/24/2021 5:22:20 PM By: Sandre Kitty Signed: 06/24/2021 7:11:13 PM By: Levan Hurst RN, BSN Entered By: Sandre Kitty on 06/24/2021 11:23:54 -------------------------------------------------------------------------------- Patient/Caregiver Education Details Patient Name: Date of Service: Caleb Alken RD C. 7/6/2022andnbsp11:15 A M Medical Record Number: [id number] Patient Account Number: [id number] Date of Birth/Gender: Treating RN: [birth year] (75 y.o. [name] Primary Care Physician: Yaakov Guthrie Other Clinician: Referring Physician: Treating Physician/Extender: [name] in Treatment: 1 Education Assessment Education Provided To: Patient Education Topics  Provided Wound/Skin Impairment: Methods: Explain/Verbal Responses: State content correctly Electronic Signature(s) Signed: 06/24/2021 7:11:13 PM By: Levan Hurst RN, BSN Entered By: Levan Hurst on 06/24/2021 18:22:05 -------------------------------------------------------------------------------- Wound Assessment Details Patient Name: Date of Service: Caleb Alken RD C. 06/24/2021 11:15 A M Medical Record Number: [id number] Patient Account Number: [id number] Date of Birth/Sex: Treating RN: December 12, 1946 (75 y.o. [name] Primary Care [name]: Yaakov Guthrie Other Clinician: Referring Allannah Kempen: Treating Crew Goren/Extender: [name] in Treatment: 1 Wound Status Wound Number: 4 Primary Neuropathic Ulcer-Non Diabetic Etiology: Wound Location: Right, Lateral Foot Wound Open Wounding Event: Gradually Appeared Status: Date Acquired: 03/30/2021 Comorbid Glaucoma, Chronic Obstructive Pulmonary Disease (COPD), Sleep Weeks Of Treatment: 1 History: Apnea, Hypertension, Neuropathy, Received Chemotherapy, Clustered Wound: No Received Radiation Photos Wound Measurements Length: (cm) 1.5 Width: (cm) 2.2 Depth: (cm) 0.1 Area: (cm) 2.592 Volume: (cm) 0.259 % Reduction in Area: 52.9% % Reduction in Volume: 52.9% Epithelialization: Small (1-33%) Tunneling: No Undermining: No Wound Description Classification: Full Thickness Without Exposed Support Structures Wound Margin: Flat and Intact Exudate Amount: Medium Exudate Type: Serosanguineous Exudate Color: red, brown Foul Odor After Cleansing: No Slough/Fibrino Yes Wound Bed Granulation Amount: Large (67-100%) Exposed Structure Granulation Quality: Pink Fascia Exposed: No Necrotic Amount: Small (1-33%) Fat Layer (Subcutaneous Tissue) Exposed: Yes Necrotic Quality: Adherent Slough Tendon Exposed: No Muscle Exposed: No Joint Exposed:  No Bone Exposed: No Treatment Notes Wound #4 (Foot) Wound  Laterality: Right, Lateral Cleanser Normal Saline Discharge Instruction: Cleanse the wound with Normal Saline prior to applying a clean dressing using gauze sponges, not tissue or cotton balls. Soap and Water Discharge Instruction: May shower and wash wound with dial antibacterial soap and water prior to dressing change. Wound Cleanser Discharge Instruction: Cleanse the wound with wound cleanser prior to applying a clean dressing using gauze sponges, not tissue or cotton balls. Peri-Wound Care Topical Primary Dressing Hydrofera Blue Classic Foam, 2x2 in Discharge Instruction: Moisten with saline prior to applying to wound bed. Apply over Santyl. Santyl Ointment Discharge Instruction: Apply nickel thick amount to wound bed as instructed Secondary Dressing Woven Gauze Sponge, Non-Sterile 4x4 in Discharge Instruction: Apply over primary dressing as directed. Optifoam Non-Adhesive Dressing, 4x4 in Discharge Instruction: cut to make a foam donut and place over primary dresssing for offloading Secured With [employer] Sterile, 4.5x3.1 (in/yd) Discharge Instruction: Secure with Kerlix as directed. 67M Medipore H Soft Cloth Surgical T ape, 2x2 (in/yd) Discharge Instruction: Secure dressing with tape as directed. Compression Wrap Compression Stockings Add-Ons Electronic Signature(s) Signed: 06/24/2021 5:22:20 PM By: Sandre Kitty Signed: 06/24/2021 7:11:13 PM By: Levan Hurst RN, BSN Entered By: Sandre Kitty on 06/24/2021 17:01:47 -------------------------------------------------------------------------------- Vitals Details Patient Name: Date of Service: Caleb Alken RD C. 06/24/2021 11:15 A M Medical Record Number: [id number] Patient Account Number: [id number] Date of Birth/Sex: Treating RN: 31-Dec-1945 (75 y.o. [name] Primary Care Kately Graffam: Yaakov Guthrie Other Clinician: Referring Talan Gildner: Treating Mairim Bade/Extender: [name] in Treatment:  1 Vital Signs Time Taken: 11:23 Temperature (F): 98.5 Height (in): 69 Pulse (bpm): 112 Weight (lbs): 210 Respiratory Rate (breaths/min): 17 Body Mass Index (BMI): 31 Blood Pressure (mmHg): 152/94 Reference Range: 80 - 120 mg / dl Electronic Signature(s) Signed: 06/24/2021 5:22:20 PM By: Sandre Kitty Entered By: Sandre Kitty on 06/24/2021 11:23:47

## 2021-06-24 NOTE — Progress Notes (Signed)
[name], [name] ([id number]) Visit Report for 06/24/2021 Debridement Details Patient Name: Date of Service: [name], Caleb RD C. 06/24/2021 11:15 A M Medical Record Number: [id number] Patient Account Number: [id number] Date of Birth/Sex: Treating RN: [birth year] (75 y.o. [name] Primary Care Provider: Yaakov Guthrie Other Clinician: Referring Provider: Treating Provider/Extender: [name] in Treatment: 1 Debridement Performed for Assessment: Wound #4 Right,Lateral Foot Performed By: Physician Ricard Dillon., MD Debridement Type: Debridement Level of Consciousness (Pre-procedure): Awake and Alert Pre-procedure Verification/Time Out Yes - 11:51 Taken: Start Time: 11:51 T Area Debrided (L x W): otal 1.5 (cm) x 2.2 (cm) = 3.3 (cm) Tissue and other material debrided: Viable, Non-Viable Level: Non-Viable Tissue Debridement Description: Selective/Open Wound Instrument: Curette Bleeding: Moderate Hemostasis Achieved: Silver Nitrate End Time: 11:52 Procedural Pain: 0 Post Procedural Pain: 0 Response to Treatment: Procedure was tolerated well Level of Consciousness (Post- Awake and Alert procedure): Post Debridement Measurements of Total Wound Length: (cm) 1.5 Width: (cm) 2.2 Depth: (cm) 0.1 Volume: (cm) 0.259 Character of Wound/Ulcer Post Debridement: Requires Further Debridement Post Procedure Diagnosis Same as Pre-procedure Electronic Signature(s) Signed: 06/24/2021 5:13:41 PM By: Linton Ham MD Signed: 06/24/2021 7:11:13 PM By: Levan Hurst RN, BSN Entered By: Linton Ham on 06/24/2021 12:28:07 -------------------------------------------------------------------------------- HPI Details Patient Name: Date of Service: Caleb Alken RD C. 06/24/2021 11:15 A M Medical Record Number: [id number] Patient Account Number: [id number] Date of Birth/Sex: Treating RN: [birth year] (75 y.o. 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 He states this started a little over 2 months ago. This is now the third time he has developed a wound to this area. He followed with podiatry for this issue and has been using Betadine daily to it. He recently switched to silver alginate a week ago. He has been on doxycycline since May For this issue and just recently started a course of Keflex prescribed in the ED on 6/18 For possible cellulitis. Currently denies signs of infection. He uses a front offloading shoe to help with relieving the pressure associated with the wound formation. He reports minimal drainage. He does not have sensation to his feet and denies any pain. Currently denies signs of infection. 6/29; this is a patient with chemotherapy-induced neuropathy. We had in clinic last year he was readmitted to our clinic last week. He has an area on the right fifth metatarsal head. Dr. Heber Parker admitted to the to the clinic last week started him on Santyl they are using this daily. The wound started in March she went to podiatry. They are using Betadine to this I am not really sure about the benefit of Betadine in this type of situation. There is not appear to be any active infection. He is using a forefoot offloading boot 7/6; wound on the right fifth metatarsal head. Using Santyl daily. He is using a forefoot offloading boot but claims to be staying off this religiously Again talk to me about a vacation they had planned for mid August. They have a cancellation insurance and want to go ahead and put this in. I asked him to see if there is a specific form they want filled out. It  does seem  unlikely that he will be healed by that time [August 12]. Furthermore there are apparently no elevators they be going up stairs. Electronic Signature(s) Signed: 06/24/2021 5:13:41 PM By: Linton Ham MD Entered By: Linton Ham on 06/24/2021 12:30:29 -------------------------------------------------------------------------------- Physical Exam Details Patient Name: Date of Service: Caleb Alken RD C. 06/24/2021 11:15 A M Medical Record Number: [id number] Patient Account Number: [id number] Date of Birth/Sex: Treating RN: February 11, 1946 (75 y.o. [name] Primary Care Provider: Yaakov Guthrie Other Clinician: Referring Provider: Treating Provider/Extender: [name] in Treatment: 1 Constitutional Patient is hypertensive.. Pulse regular and within target range for patient.Marland Kitchen Respirations regular, non-labored and within target range.. Temperature is normal and within the target range for the patient.Marland Kitchen Appears in no distress. Cardiovascular Pedal pulses are palpable on the right. Notes Wound exam; some improvement in the width. Under illumination still a very fibrinous nonviable surface. Also notable that the wound bed is somewhat boggy but there is no obvious infection. I used a #5 curette to debride the fibrinous gritty surface over this wound. Hemostasis with silver nitrate. He is on warfarin Electronic Signature(s) Signed: 06/24/2021 5:13:41 PM By: Linton Ham MD Entered By: Linton Ham on 06/24/2021 12:33:07 -------------------------------------------------------------------------------- Physician Orders Details Patient Name: Date of Service: Caleb Alken RD C. 06/24/2021 11:15 A M Medical Record Number: [id number] Patient Account Number: [id number] Date of Birth/Sex: Treating RN: 1946/02/12 (75 y.o. 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 - with Dr. Dellia Nims Return A Bathing/ Shower/ Hygiene May shower with protection but do not get wound dressing(s) wet. Edema Control - Lymphedema / SCD / Other Elevate legs to the level of the heart or above for 30 minutes daily and/or when sitting, a frequency of: - throughout the day Avoid standing for long periods of time. Wound Treatment Wound #4 - Foot Wound Laterality: Right, Lateral Cleanser: Normal Saline (DME) (Generic) 1 x Per Day/15 Days Discharge Instructions: Cleanse the wound with Normal Saline prior to applying a clean dressing using gauze sponges, not tissue or cotton balls. Cleanser: Soap and Water 1 x Per Day/15 Days Discharge Instructions: May shower and wash wound with dial antibacterial soap and water prior to dressing change. Cleanser: Wound Cleanser (Generic) 1 x Per Day/15 Days Discharge Instructions: Cleanse the wound with wound cleanser prior to applying a clean dressing using gauze sponges, not tissue or cotton balls. Prim Dressing: Hydrofera Blue Classic Foam, 2x2 in (DME) (Generic) 1 x Per Day/15 Days ary Discharge Instructions: Moisten with saline prior to applying to wound bed. Apply over Santyl. Prim Dressing: Santyl Ointment 1 x Per Day/15 Days ary Discharge Instructions: Apply nickel thick amount to wound bed as instructed Secondary Dressing: Woven Gauze Sponge, Non-Sterile 4x4 in (Generic) 1 x Per Day/15 Days Discharge Instructions: Apply over primary dressing as directed. Secondary Dressing: Optifoam Non-Adhesive Dressing, 4x4 in (Generic) 1 x  Per Day/15 Days Discharge Instructions: cut to make a foam donut and place over primary dresssing for offloading Secured With: Kerlix Roll Sterile, 4.5x3.1 (in/yd) (DME) (Generic) 1 x Per Day/15 Days Discharge Instructions: Secure with Kerlix as directed. Secured With: 84M Medipore H Soft  Cloth Surgical Tape, 2x2 (in/yd) (DME) (Generic) 1 x Per Day/15 Days Discharge Instructions: Secure dressing with tape as directed. Electronic Signature(s) Signed: 06/24/2021 5:13:41 PM By: Linton Ham MD Signed: 06/24/2021 7:11:13 PM By: Levan Hurst RN, BSN Entered By: Levan Hurst on 06/24/2021 11:57:11 -------------------------------------------------------------------------------- Problem List Details Patient Name: Date of Service: Caleb Alken RD C. 06/24/2021 11:15 A M Medical Record Number: [id number] Patient Account Number: [id number] Date of Birth/Sex: Treating RN: 21-Jul-1946 (75 y.o. 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 06/24/2021 11:15 A M Medical Record Number: [id number] Patient Account Number: [id number] Date of Birth/Sex: Treating  RN: 07/14/1946 (75 y.o. [name] Primary Care Provider: Yaakov Guthrie Other Clinician: Referring Provider: Treating Provider/Extender: [name] in Treatment: 1 Subjective History of Present Illness (HPI) Admission 6/23 Mr. 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 He states this started a little over 2 months ago. This is now the third time he has developed a wound to this area. He followed with podiatry for this issue and has been using Betadine daily to it. He recently switched to silver alginate a week ago. He has been on doxycycline since May For this issue and just recently started a course of Keflex prescribed in the ED on 6/18 For possible cellulitis. Currently denies signs of infection. He uses a front offloading shoe to help with relieving the pressure associated with the wound formation. He reports minimal drainage. He does not have sensation to his feet and denies any pain. Currently denies signs of infection. 6/29; this is a patient with chemotherapy-induced neuropathy. We had in clinic last year he was readmitted to our clinic last week. He has an area on the right fifth metatarsal head. Dr. Heber Woolstock admitted to the to the clinic last week started him on Santyl they are using this daily. The wound started in March she went to podiatry. They are using Betadine to this I am not really sure about the benefit of Betadine in this type of situation. There is not appear to be any active infection. He is using a forefoot offloading boot 7/6; wound on the right fifth metatarsal head. Using Santyl daily. He is using a forefoot offloading boot but claims to be staying off this religiously Again talk to me about a vacation they had planned for mid August. They have a cancellation insurance and want to go ahead and  put this in. I asked him to see if there is a specific form they want filled out. It does seem unlikely that he will be healed by that time [August 12]. Furthermore there are apparently no elevators they be going up stairs. Objective Constitutional Patient is hypertensive.. Pulse regular and within target range for patient.Marland Kitchen Respirations regular,  non-labored and within target range.. Temperature is normal and within the target range for the patient.Marland Kitchen Appears in no distress. Vitals Time Taken: 11:23 AM, Height: 69 in, Weight: 210 lbs, BMI: 31, Temperature: 98.5 F, Pulse: 112 bpm, Respiratory Rate: 17 breaths/min, Blood Pressure: 152/94 mmHg. Cardiovascular Pedal pulses are palpable on the right. General Notes: Wound exam; some improvement in the width. Under illumination still a very fibrinous nonviable surface. Also notable that the wound bed is somewhat boggy but there is no obvious infection. I used a #5 curette to debride the fibrinous gritty surface over this wound. Hemostasis with silver nitrate. He is on warfarin Integumentary (Hair, Skin) Wound #4 status is Open. Original cause of wound was Gradually Appeared. The date acquired was: 03/30/2021. The wound has been in treatment 1 weeks. The wound is located on the Right,Lateral Foot. The wound measures 1.5cm length x 2.2cm width x 0.1cm depth; 2.592cm^2 area and 0.259cm^3 volume. There is Fat Layer (Subcutaneous Tissue) exposed. There is no tunneling or undermining noted. There is a medium amount of serosanguineous drainage noted. The wound margin is flat and intact. There is large (67-100%) pink granulation within the wound bed. There is a small (1-33%) amount of necrotic tissue within the wound bed including Adherent Slough. 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 There was a Selective/Open Wound Non-Viable Tissue Debridement with a total area of 3.3 sq cm performed by Ricard Dillon., MD. With the following instrument(s): Curette to remove Viable and Non- Viable tissue/material.. No specimens were taken. A time out was conducted at 11:51, prior to the start of the procedure. A Moderate amount of bleeding was controlled with Silver Nitrate. The procedure was tolerated well with a pain level of 0 throughout and a pain level of 0 following the procedure. Post Debridement Measurements: 1.5cm length x 2.2cm width x 0.1cm depth; 0.259cm^3 volume. Character of Wound/Ulcer Post Debridement requires further debridement. Post procedure Diagnosis Wound #4: Same as Pre-Procedure Plan Follow-up Appointments: Return Appointment in 1 week. - with Dr. Arcola Jansky Shower/ Hygiene: May shower with protection but do not get wound dressing(s) wet. Edema Control - Lymphedema / SCD / Other: Elevate legs to the level of the heart or above for 30 minutes daily and/or when sitting, a frequency of: - throughout the day Avoid standing for long periods of time. WOUND #4: - Foot Wound Laterality: Right, Lateral Cleanser: Normal Saline (DME) (Generic) 1 x Per Day/15 Days Discharge Instructions: Cleanse the wound with Normal Saline prior to applying a clean dressing using gauze sponges, not tissue or cotton balls. Cleanser: Soap and Water 1 x Per Day/15 Days Discharge Instructions: May shower and wash wound with dial antibacterial soap and water prior to dressing change. Cleanser: Wound Cleanser (Generic) 1 x Per Day/15 Days Discharge Instructions: Cleanse the wound with wound cleanser prior to applying a clean dressing using gauze sponges, not tissue or cotton balls. Prim  Dressing: Hydrofera Blue Classic Foam, 2x2 in (DME) (Generic) 1 x Per Day/15 Days ary Discharge Instructions: Moisten with saline prior to applying to wound bed. Apply over Santyl. Prim Dressing: Santyl Ointment 1 x Per Day/15 Days ary Discharge Instructions: Apply nickel thick amount to wound bed as instructed Secondary Dressing: Woven  Gauze Sponge, Non-Sterile 4x4 in (Generic) 1 x Per Day/15 Days Discharge Instructions: Apply over primary dressing as directed. Secondary Dressing: Optifoam Non-Adhesive Dressing, 4x4 in (Generic) 1 x Per Day/15 Days Discharge Instructions: cut to make a foam donut and place over primary dresssing for offloading Secured With: Kerlix Roll Sterile, 4.5x3.1 (in/yd) (DME) (Generic) 1 x Per Day/15 Days Discharge Instructions: Secure with Kerlix as directed. Secured With: 4M Medipore H Soft Cloth Surgical T ape, 2x2 (in/yd) (DME) (Generic) 1 x Per Day/15 Days Discharge Instructions: Secure dressing with tape as directed. 1. I am going to use Santyl to continue on the wound surface however I am going to use secondary Hydrofera Blue 2. Unfortunately the wound is still likely to require further mechanical debridement. We simply do not have a viable surface for epithelialization 3. I see no evidence of infection and for now I have not done any additional cultures or x-rays. 4. He is using a forefoot off loader but we may need to change to a total contact cast 5. With regards to the patient's request about putting through his travel insurance I think this is reasonable. I asked them to see if there is a specific form that needs to be completed or would have a general letter from our clinic suffice. They will get back to [place] next Electronic Signature(s) Signed: 06/24/2021 5:13:41 PM By: Linton Ham MD Entered By: Linton Ham on 06/24/2021 12:35:28 -------------------------------------------------------------------------------- SuperBill Details Patient Name: Date  of Service: Caleb Alken [place]. 06/24/2021 Medical Record Number: [id number] Patient Account Number: [id number] Date of Birth/Sex: Treating RN: 1946/12/11 (75 y.o. 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

## 2021-07-01 ENCOUNTER — Encounter (HOSPITAL_BASED_OUTPATIENT_CLINIC_OR_DEPARTMENT_OTHER): Payer: Medicare Other | Admitting: Internal Medicine

## 2021-07-01 ENCOUNTER — Other Ambulatory Visit: Payer: Self-pay

## 2021-07-01 DIAGNOSIS — L97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 with unspecified severity: Secondary | ICD-10-CM | POA: Diagnosis not present

## 2021-07-02 NOTE — Progress Notes (Signed)
[name] ([id number]) Visit Report for 07/01/2021 Debridement Details Patient Name: Date of Service: [name] RD C. 07/01/2021 11:00 A M Medical Record Number: [id number] Patient Account Number: [id number] Date of Birth/Sex: Treating RN: [birth year] (75 y.o. [name] Primary Care Provider: Yaakov Everett Other Clinician: Referring Provider: Treating Provider/Extender: [name] in Treatment: 2 Debridement Performed for Assessment: Wound #4 Right,Lateral Foot Performed By: Physician [name]., MD Debridement Type: Debridement Level of Consciousness (Pre-procedure): Awake and Alert Pre-procedure Verification/Time Out Yes - 11:40 Taken: Start Time: 11:40 T Area Debrided (L x W): otal 1.6 (cm) x 2.3 (cm) = 3.68 (cm) Tissue and other material debrided: Viable, Non-Viable, Slough, Subcutaneous, Slough Level: Skin/Subcutaneous Tissue Debridement Description: Excisional Instrument: Curette Bleeding: Moderate Hemostasis Achieved: Silver Nitrate End Time: 11:42 Procedural Pain: 0 Post Procedural Pain: 0 Response to Treatment: Procedure was tolerated well Level of Consciousness (Post- Awake and Alert procedure): Post Debridement Measurements of Total Wound Length: (cm) 1.6 Width: (cm) 2.3 Depth: (cm) 0.1 Volume: (cm) 0.289 Character of Wound/Ulcer Post Debridement: Requires Further Debridement Post Procedure Diagnosis Same as Pre-procedure Electronic Signature(s) Signed: 07/01/2021 5:03:28 PM By: Caleb Ham MD Signed: 07/02/2021 6:01:10 PM By: Caleb Hurst RN, BSN Entered By: [name] on 07/01/2021 12:44:21 -------------------------------------------------------------------------------- HPI Details Patient Name: Date of Service: [name] RD C. 07/01/2021 11:00 A M Medical Record Number: [id number] Patient Account Number: [id number] Date of Birth/Sex: Treating RN: [birth year] (75 y.o. [name] Primary Care  Provider: Yaakov Everett Other Clinician: Referring Provider: Treating Provider/Extender: [name] in Treatment: 2 History of Present Illness HPI Description: Admission 6/23 Mr. 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 He states this started a little over 2 months ago. This is now the third time he has developed a wound to this area. He followed with podiatry for this issue and has been using Betadine daily to it. He recently switched to silver alginate a week ago. He has been on doxycycline since May For this issue and just recently started a course of Keflex prescribed in the ED on 6/18 For possible cellulitis. Currently denies signs of infection. He uses a front offloading shoe to help with relieving the pressure associated with the wound formation. He reports minimal drainage. He does not have sensation to his feet and denies any pain. Currently denies signs of infection. 6/29; this is a patient with chemotherapy-induced neuropathy. We had in clinic last year he was readmitted to our clinic last week. He has an area on the right fifth metatarsal head. Dr. Heber Hornsby Everett admitted to the to the clinic last week started him on Santyl they are using this daily. The wound started in March she went to podiatry. They are using Betadine to this I am not really sure about the benefit of Betadine in this type of situation. There is not appear to be any active infection. He is using a forefoot offloading boot 7/6; wound on the right fifth metatarsal head. Using Santyl daily. He is using a forefoot offloading boot but claims to be staying off this religiously Again talk to me about a vacation they had planned for mid August. They have a cancellation insurance and want to go ahead and put this in. I asked him to see if there is a specific  form they want filled out. It  does seem unlikely that he will be healed by that time [August 12]. Furthermore there are apparently no elevators they be going up stairs. 7/13; right fifth met head. Still not a viable surface using Hydrofera Blue and Santyl. Offloading in a forefoot offloading Electronic Signature(s) Signed: 07/01/2021 5:03:28 PM By: Caleb Ham MD Entered By: [name] on 07/01/2021 12:45:41 -------------------------------------------------------------------------------- Physical Exam Details Patient Name: Date of Service: [name] RD C. 07/01/2021 11:00 A M Medical Record Number: [id number] Patient Account Number: [id number] Date of Birth/Sex: Treating RN: 1946/05/22 (75 y.o. [name] Primary Care Provider: Yaakov Everett Other Clinician: Referring Provider: Treating Provider/Extender: [name] in Treatment: 2 Constitutional Sitting or standing Blood Pressure is within target range for patient.. Pulse regular and within target range for patient.Marland Kitchen Respirations regular, non-labored and within target range.. Temperature is normal and within the target range for the patient.Marland Kitchen Appears in no distress. Notes Wound exam; some improvement in general in terms of the surface but still a very gritty fibrinous surface. I used a #5 curette to remove subcutaneous tissue. No evidence of Electronic Signature(s) Signed: 07/01/2021 5:03:28 PM By: Caleb Ham MD Entered By: [name] on 07/01/2021 12:46:31 -------------------------------------------------------------------------------- Physician Orders Details Patient Name: Date of Service: [name] RD C. 07/01/2021 11:00 A M Medical Record Number: [id number] Patient Account Number: [id number] Date of Birth/Sex: Treating RN: [birth year] (75 y.o. 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 - with Dr. Dellia Nims Return A Bathing/ Shower/ Hygiene May shower with protection but do not get wound dressing(s) wet. Edema Control - Lymphedema / SCD / Other Elevate legs to the level of the heart or above for 30 minutes daily and/or when sitting, a frequency of: - throughout the day Avoid standing for long periods of time. Wound Treatment Wound #4 - Foot Wound Laterality: Right, Lateral Cleanser: Normal Saline (Generic) 1 x Per Day/15 Days Discharge Instructions: Cleanse the wound with Normal Saline prior to applying a clean dressing using gauze sponges, not tissue or cotton balls. Cleanser: Soap and Water 1 x Per Day/15 Days Discharge Instructions: May shower and wash wound with dial antibacterial soap and water prior to dressing change. Cleanser: Wound Cleanser (Generic) 1 x Per Day/15 Days Discharge Instructions: Cleanse the wound with wound cleanser prior to applying a clean dressing using gauze sponges, not tissue or cotton balls. Prim Dressing: Hydrofera Blue Classic Foam, 2x2 in (Generic) 1 x Per Day/15 Days ary Discharge Instructions: Moisten with saline prior to applying to wound bed. Apply over Santyl. Prim Dressing: Santyl Ointment 1 x Per Day/15 Days ary Discharge Instructions: Apply nickel thick amount to wound bed as instructed Secondary Dressing: Woven Gauze Sponge, Non-Sterile 4x4 in (Generic) 1 x Per Day/15 Days Discharge Instructions: Apply over primary dressing as directed. Secondary Dressing: Optifoam Non-Adhesive Dressing, 4x4 in (Generic) 1 x  Per Day/15 Days Discharge Instructions: cut to make a foam donut and place over primary dresssing for offloading Secured With: Kerlix Roll Sterile, 4.5x3.1 (in/yd) (Generic) 1 x Per Day/15 Days Discharge Instructions: Secure with Kerlix as directed. Secured With: 26M Medipore H Soft Cloth Surgical Tape,  2x2 (in/yd) (Generic) 1 x Per Day/15 Days Discharge Instructions: Secure dressing with tape as directed. Electronic Signature(s) Signed: 07/01/2021 5:03:28 PM By: Caleb Ham MD Signed: 07/02/2021 6:01:10 PM By: Caleb Hurst RN, BSN Entered By: [name] on 07/01/2021 11:46:33 -------------------------------------------------------------------------------- Problem List Details Patient Name: Date of Service: [name] RD C. 07/01/2021 11:00 A M Medical Record Number: [id number] Patient Account Number: [id number] Date of Birth/Sex: Treating RN: 11/10/1946 (75 y.o. 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 07/01/2021 11:00 A M Medical Record Number: [id number] Patient Account Number: [id number] Date of Birth/Sex: Treating RN: 1946-05-20  (75 y.o. 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 He states this started a little over 2 months ago. This is now the third time he has developed a wound to this area. He followed with podiatry for this issue and has been using Betadine daily to it. He recently switched to silver alginate a week ago. He has been on doxycycline since May For this issue and just recently started a course of Keflex prescribed in the ED on 6/18 For possible cellulitis. Currently denies signs of infection. He uses a front offloading shoe to help with relieving the pressure associated with the wound formation. He reports minimal drainage. He does not have sensation to his feet and denies any pain. Currently denies signs of infection. 6/29; this is a patient with chemotherapy-induced neuropathy. We had in clinic last year he was readmitted to our clinic last week. He has an area on the right fifth metatarsal head. Dr. Heber Caleb admitted to the to the clinic last week started him on Santyl they are using this daily. The wound started in March she went to podiatry. They are using Betadine to this I am not really sure about the benefit of Betadine in this type of situation. There is not appear to be any active infection. He is using a forefoot offloading boot 7/6; wound on the right fifth metatarsal head. Using Santyl daily. He is using a forefoot offloading boot but claims to be staying off this religiously Again talk to me about a vacation they had planned for mid August. They have a cancellation insurance and want to go ahead and put this in. I  asked him to see if there is a specific form they want filled out. It does seem unlikely that he will be healed by that time [August 12]. Furthermore there are apparently no elevators they be going up stairs. 7/13; right fifth met head. Still not a viable surface using Hydrofera Blue and Santyl. Offloading in a forefoot  offloading Objective Constitutional Sitting or standing Blood Pressure is within target range for patient.. Pulse regular and within target range for patient.Marland Kitchen Respirations regular, non-labored and within target range.. Temperature is normal and within the target range for the patient.Marland Kitchen Appears in no distress. Vitals Time Taken: 11:37 AM, Height: 69 in, Weight: 210 lbs, BMI: 31, Temperature: 98.6 F, Pulse: 79 bpm, Respiratory Rate: 18 breaths/min, Blood Pressure: 148/78 mmHg. General Notes: Wound exam; some improvement in general in terms of the surface but still a very gritty fibrinous surface. I used a #5 curette to remove subcutaneous tissue. No evidence of Integumentary (Hair, Skin) Wound #4 status is Open. Original cause of wound was Gradually Appeared. The date acquired was: 03/30/2021. The wound has been in treatment 2 weeks. The wound is located on the Right,Lateral Foot. The wound measures 1.6cm length x 2.3cm width x 0.1cm depth; 2.89cm^2 area and 0.289cm^3 volume. There is Fat Layer (Subcutaneous Tissue) exposed. There is no tunneling or undermining noted. There is a medium amount of serosanguineous drainage noted. The wound margin is flat and intact. There is large (67-100%) pink granulation within the wound bed. There is a small (1-33%) amount of necrotic tissue within the wound bed including Adherent Slough. 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 There was a Excisional Skin/Subcutaneous Tissue Debridement with a total area of 3.68 sq cm performed by [name]., MD. With the following instrument(s): Curette to remove Viable and Non-Viable tissue/material. Material removed includes Subcutaneous Tissue and Slough and. No specimens were taken. A time out was conducted at 11:40, prior to the start of the procedure. A Moderate amount of bleeding was controlled with Silver Nitrate. The procedure was tolerated well with a pain level of 0 throughout and a pain level of 0 following the procedure. Post Debridement Measurements: 1.6cm length x 2.3cm width x 0.1cm depth; 0.289cm^3 volume. Character of Wound/Ulcer Post Debridement requires further debridement. Post procedure Diagnosis Wound #4: Same as Pre-Procedure Everett Follow-up Appointments: Return Appointment in 1 week. - with Dr. Arcola Jansky Shower/ Hygiene: May shower with protection but do not get wound dressing(s) wet. Edema Control - Lymphedema / SCD / Other: Elevate legs to the level of the heart or above for 30 minutes daily and/or when sitting, a frequency of: - throughout the day Avoid standing for long periods of time. WOUND #4: - Foot Wound Laterality: Right, Lateral Cleanser: Normal Saline (Generic) 1 x Per Day/15 Days Discharge Instructions: Cleanse the wound with Normal Saline prior to applying a clean dressing using gauze sponges, not tissue or cotton balls. Cleanser: Soap and Water 1 x Per Day/15 Days Discharge Instructions: May shower and wash wound with dial antibacterial soap and water prior to dressing change. Cleanser: Wound Cleanser (Generic) 1 x Per Day/15 Days Discharge Instructions: Cleanse the wound with wound cleanser prior to applying a clean dressing using gauze sponges, not tissue or cotton balls. Prim Dressing:  Hydrofera Blue Classic Foam, 2x2 in (Generic) 1 x Per Day/15 Days ary Discharge Instructions: Moisten with saline prior to applying to wound bed. Apply over Santyl. Prim Dressing: Santyl Ointment 1 x Per Day/15 Days ary Discharge Instructions: Apply nickel thick amount to wound bed as instructed Secondary Dressing: Woven Gauze Sponge,  Non-Sterile 4x4 in (Generic) 1 x Per Day/15 Days Discharge Instructions: Apply over primary dressing as directed. Secondary Dressing: Optifoam Non-Adhesive Dressing, 4x4 in (Generic) 1 x Per Day/15 Days Discharge Instructions: cut to make a foam donut and place over primary dresssing for offloading Secured With: Kerlix Roll Sterile, 4.5x3.1 (in/yd) (Generic) 1 x Per Day/15 Days Discharge Instructions: Secure with Kerlix as directed. Secured With: 5M Medipore H Soft Cloth Surgical T ape, 2x2 (in/yd) (Generic) 1 x Per Day/15 Days Discharge Instructions: Secure dressing with tape as directed. 1. Still Hydrofera Blue and Santyl 2. As soon as I can get the surface of this wound to look better I am likely to use Hydrofera Blue under a total contact cast Electronic Signature(s) Signed: 07/01/2021 5:03:28 PM By: Caleb Ham MD Entered By: [name] on 07/01/2021 12:47:53 -------------------------------------------------------------------------------- SuperBill Details Patient Name: Date of Service: [name] RD C. 07/01/2021 Medical Record Number: [id number] Patient Account Number: [id number] Date of Birth/Sex: Treating RN: [birth year] (75 y.o. 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

## 2021-07-06 NOTE — Progress Notes (Signed)
[name], [name] ([id number]) Visit Report for 07/01/2021 Arrival Information Details Patient Name: Date of Service: [name], Caleb RD C. 07/01/2021 11:00 A M Medical Record Number: [id number] Patient Account Number: [id number] Date of Birth/Sex: Treating RN: 19-Jan-1946 (75 y.o. [name] Primary Care Estephania Licciardi: Yaakov Guthrie Other Clinician: Referring Lakyla Biswas: Treating Kayshawn Ozburn/Extender: [name] in Treatment: 2 Visit Information History Since Last Visit Added or deleted any medications: No Patient Arrived: Ambulatory Any new allergies or adverse reactions: No Arrival Time: 11:37 Had a fall or experienced change in No Accompanied By: wife activities of daily living that may affect Transfer Assistance: None risk of falls: Patient Identification Verified: Yes Signs or symptoms of abuse/neglect since last visito No Secondary Verification Process Completed: Yes Hospitalized since last visit: No Patient Requires Transmission-Based Precautions: No Implantable device outside of the clinic excluding No Patient Has Alerts: Yes cellular tissue based products placed in the center Patient Alerts: Patient on Blood Thinner since last visit: R ABI: 1.01 TBI: 0.77 Has Dressing in Place as Prescribed: Yes L ABI: 1.22 TBI: 0.87 Pain Present Now: No 05/01/21 Electronic Signature(s) Signed: 07/02/2021 6:01:10 PM By: Levan Hurst RN, BSN Entered By: Levan Hurst on 07/01/2021 11:37:21 -------------------------------------------------------------------------------- Encounter Discharge Information Details Patient Name: Date of Service: Caleb Alken RD C. 07/01/2021 11:00 A M Medical Record Number: [id number] Patient Account Number: [id number] Date of Birth/Sex: Treating RN: Sep 03, 1946 (75 y.o. [name], [name] Primary Care Dantavious Snowball: Yaakov Guthrie Other Clinician: Referring Kaipo Ardis: Treating Myriam Brandhorst/Extender: [name] in Treatment:  2 Encounter Discharge Information Items Post Procedure Vitals Discharge Condition: Stable Temperature (F): 98.7 Ambulatory Status: Ambulatory Pulse (bpm): 74 Discharge Destination: Home Respiratory Rate (breaths/min): 17 Transportation: Private Auto Blood Pressure (mmHg): 144/74 Accompanied By: self Schedule Follow-up Appointment: Yes Clinical Summary of Care: Patient Declined Electronic Signature(s) Signed: 07/01/2021 6:12:45 PM By: Rhae Hammock RN Entered By: Rhae Hammock on 07/01/2021 17:26:48 -------------------------------------------------------------------------------- Lower Extremity Assessment Details Patient Name: Date of Service: Caleb Alken RD C. 07/01/2021 11:00 A M Medical Record Number: [id number] Patient Account Number: [id number] Date of Birth/Sex: Treating RN: 1946/05/15 (75 y.o. [name] Primary Care Annalie Wenner: Yaakov Guthrie Other Clinician: Referring Tryniti Laatsch: Treating Sophronia Varney/Extender: [name] in Treatment: 2 Edema Assessment Assessed: Shirlyn Goltz: No] Patrice Paradise: No] Edema: [Left: Ye] [Right: s] Calf Left: Right: Point of Measurement: 34 cm From Medial Instep 39 cm Ankle Left: Right: Point of Measurement: 13 cm From Medial Instep 24.5 cm Vascular Assessment Pulses: Dorsalis Pedis Palpable: [Right:Yes] Electronic Signature(s) Signed: 07/02/2021 6:01:10 PM By: Levan Hurst RN, BSN Entered By: Levan Hurst on 07/01/2021 11:44:08 -------------------------------------------------------------------------------- Multi Wound Chart Details Patient Name: Date of Service: Caleb Alken RD C. 07/01/2021 11:00 A M Medical Record Number: [id number] Patient Account Number: [id number] Date of Birth/Sex: Treating RN: [birth year] (75 y.o. 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] [N/A:N/A] Primary Etiology: [4:Glaucoma, Chronic Obstructive] [N/A:N/A] Comorbid History: [4:Pulmonary Disease (COPD), Sleep Apnea, Hypertension, Neuropathy, Received Chemotherapy, Received Radiation 03/30/2021] [N/A:N/A] Date Acquired: [4:2] [N/A:N/A] Weeks of Treatment: [4:Open] [N/A:N/A] Wound Status: [4:1.6x2.3x0.1] [N/A:N/A] Measurements L x W x D (cm) [4:2.89] [N/A:N/A] A (cm) : rea [4:0.289] [N/A:N/A] Volume (cm) : [4:47.40%] [N/A:N/A] % Reduction in  Area: [4:47.50%] [N/A:N/A] % Reduction in Volume: [4:Full Thickness Without Exposed] [N/A:N/A] Classification: [4:Support Structures Medium] [N/A:N/A] Exudate A mount: [4:Serosanguineous] [N/A:N/A] Exudate Type: [4:red, brown] [N/A:N/A] Exudate Color: [4:Flat and Intact] [N/A:N/A] Wound Margin: [4:Large (67-100%)] [N/A:N/A] Granulation A mount: [4:Pink] [N/A:N/A] Granulation Quality: [4:Small (1-33%)] [N/A:N/A] Necrotic A mount: [4:Fat Layer (Subcutaneous Tissue): Yes N/A] Exposed Structures: [4:Fascia: No Tendon: No Muscle: No Joint: No Bone: No Small (1-33%)] [N/A:N/A] Epithelialization: [4:Debridement - Excisional] [N/A:N/A] Debridement: Pre-procedure Verification/Time Out 11:40 [N/A:N/A] Taken: [4:Subcutaneous, Slough] [N/A:N/A] Tissue Debrided: [4:Skin/Subcutaneous Tissue] [N/A:N/A] Level: [4:3.68] [N/A:N/A] Debridement A (sq cm): [4:rea Curette] [N/A:N/A] Instrument: [4:Moderate] [N/A:N/A] Bleeding: [4:Silver Nitrate] [N/A:N/A] Hemostasis A chieved: [4:0] [N/A:N/A] Procedural Pain: [4:0] [N/A:N/A] Post Procedural Pain: [4:Procedure was tolerated well] [N/A:N/A] Debridement Treatment Response:  [4:1.6x2.3x0.1] [N/A:N/A] Post Debridement Measurements L x W x D (cm) [4:0.289] [N/A:N/A] Post Debridement Volume: (cm) [4:Debridement] [N/A:N/A] Treatment Notes Electronic Signature(s) Signed: 07/01/2021 5:03:28 PM By: Linton Ham MD Signed: 07/02/2021 6:01:10 PM By: Levan Hurst RN, BSN Entered By: Linton Ham on 07/01/2021 12:44:11 -------------------------------------------------------------------------------- Multi-Disciplinary Care Plan Details Patient Name: Date of Service: Caleb Alken RD C. 07/01/2021 11:00 A M Medical Record Number: [id number] Patient Account Number: [id number] Date of Birth/Sex: Treating RN: 1946/07/05 (75 y.o. [name] Primary Care Huxley Vanwagoner: Yaakov Guthrie Other Clinician: Referring Verland Sprinkle: Treating Cincere Zorn/Extender: [name] in Treatment: 2 Active Inactive Wound/Skin Impairment Nursing Diagnoses: Impaired tissue integrity Goals: Patient/caregiver will verbalize understanding of skin care regimen Date Initiated: 06/11/2021 Target Resolution Date: 07/10/2021 Goal Status: Active Ulcer/skin breakdown will have a volume reduction of 30% by week 4 Date Initiated: 06/11/2021 Target Resolution Date: 07/10/2021 Goal Status: Active Interventions: Assess patient/caregiver ability to obtain necessary supplies Assess patient/caregiver ability to perform ulcer/skin care regimen upon admission and as needed Assess ulceration(s) every visit Notes: Electronic Signature(s) Signed: 07/02/2021 6:01:10 PM By: Levan Hurst RN, BSN Entered By: Levan Hurst on 07/01/2021 17:31:17 -------------------------------------------------------------------------------- Pain Assessment Details Patient Name: Date of Service: Caleb Alken RD C. 07/01/2021 11:00 A M Medical Record Number: [id number] Patient Account Number: [id number] Date of Birth/Sex: Treating RN: 1945-12-26 (75 y.o. [name] Primary Care Natelie Ostrosky: Yaakov Guthrie Other Clinician: Referring Carlos Heber: Treating Avigayil Ton/Extender: [name] in Treatment: 2 Active Problems Location of Pain Severity and Description of Pain Patient Has Paino No Site Locations Pain Management and Medication Current Pain Management: Electronic Signature(s) Signed: 07/02/2021 6:01:10 PM By: Levan Hurst RN, BSN Entered By: Levan Hurst on 07/01/2021 11:43:59 -------------------------------------------------------------------------------- Patient/Caregiver Education Details Patient Name: Date of Service: Caleb Alken RD C. 7/13/2022andnbsp11:00 [place] Record Number: [id number] Patient Account Number: [id number] Date of Birth/Gender: Treating RN: [birth year] (75 y.o. [name] Primary Care Physician: Yaakov Guthrie Other Clinician: Referring Physician: Treating Physician/Extender: [name] in Treatment: 2 Education Assessment Education Provided To: Patient Education Topics Provided Wound/Skin Impairment: Methods: Explain/Verbal Responses: State content correctly Electronic Signature(s) Signed: 07/02/2021 6:01:10 PM By: Levan Hurst RN, BSN Entered By: Levan Hurst on 07/01/2021 17:31:27 -------------------------------------------------------------------------------- Wound Assessment Details Patient Name: Date of Service: Caleb Alken RD C. 07/01/2021 11:00 A M Medical Record Number: [id number] Patient Account Number: [id number] Date of Birth/Sex: Treating RN: Mar 30, 1946 (75 y.o. [name] Primary Care Jeovany Huitron: Yaakov Guthrie Other Clinician: Referring Alieu Finnigan: Treating Kya Mayfield/Extender: [name] in Treatment: 2 Wound Status Wound Number: 4 Primary Neuropathic Ulcer-Non Diabetic Etiology: Wound Location: Right, Lateral Foot Wound Open Wounding Event: Gradually Appeared Status: Date Acquired: 03/30/2021 Comorbid Glaucoma, Chronic  Obstructive Pulmonary Disease (  COPD), Sleep Weeks Of Treatment: 2 History: Apnea, Hypertension, Neuropathy, Received Chemotherapy, Clustered Wound: No Received Radiation Photos Wound Measurements Length: (cm) 1.6 Width: (cm) 2.3 Depth: (cm) 0.1 Area: (cm) 2.89 Volume: (cm) 0.289 % Reduction in Area: 47.4% % Reduction in Volume: 47.5% Epithelialization: Small (1-33%) Tunneling: No Undermining: No Wound Description Classification: Full Thickness Without Exposed Support Structures Wound Margin: Flat and Intact Exudate Amount: Medium Exudate Type: Serosanguineous Exudate Color: red, brown Wound Bed Granulation Amount: Large (67-100%) Granulation Quality: Pink Necrotic Amount: Small (1-33%) Necrotic Quality: Adherent Slough Foul Odor After Cleansing: No Slough/Fibrino Yes Exposed Structure Fascia Exposed: No Fat Layer (Subcutaneous Tissue) Exposed: Yes Tendon Exposed: No Muscle Exposed: No Joint Exposed: No Bone Exposed: No Treatment Notes Wound #4 (Foot) Wound Laterality: Right, Lateral Cleanser Normal Saline Discharge Instruction: Cleanse the wound with Normal Saline prior to applying a clean dressing using gauze sponges, not tissue or cotton balls. Soap and Water Discharge Instruction: May shower and wash wound with dial antibacterial soap and water prior to dressing change. Wound Cleanser Discharge Instruction: Cleanse the wound with wound cleanser prior to applying a clean dressing using gauze sponges, not tissue or cotton balls. Peri-Wound Care Topical Primary Dressing Hydrofera Blue Classic Foam, 2x2 in Discharge Instruction: Moisten with saline prior to applying to wound bed. Apply over Santyl. Santyl Ointment Discharge Instruction: Apply nickel thick amount to wound bed as instructed Secondary Dressing Woven Gauze Sponge, Non-Sterile 4x4 in Discharge Instruction: Apply over primary dressing as directed. Optifoam Non-Adhesive Dressing, 4x4 in Discharge  Instruction: cut to make a foam donut and place over primary dresssing for offloading Secured With [employer] Sterile, 4.5x3.1 (in/yd) Discharge Instruction: Secure with Kerlix as directed. 80M Medipore H Soft Cloth Surgical T ape, 2x2 (in/yd) Discharge Instruction: Secure dressing with tape as directed. Compression Wrap Compression Stockings Add-Ons Electronic Signature(s) Signed: 07/02/2021 6:01:10 PM By: Levan Hurst RN, BSN Signed: 07/06/2021 3:49:49 PM By: Sandre Kitty Entered By: Sandre Kitty on 07/02/2021 10:01:52 -------------------------------------------------------------------------------- Vitals Details Patient Name: Date of Service: Caleb Alken RD C. 07/01/2021 11:00 A M Medical Record Number: [id number] Patient Account Number: [id number] Date of Birth/Sex: Treating RN: [birth year] (75 y.o. [name] Primary Care Alajiah Dutkiewicz: Yaakov Guthrie Other Clinician: Referring Kris No: Treating Joylyn Duggin/Extender: [name] in Treatment: 2 Vital Signs Time Taken: 11:37 Temperature (F): 98.6 Height (in): 69 Pulse (bpm): 79 Weight (lbs): 210 Respiratory Rate (breaths/min): 18 Body Mass Index (BMI): 31 Blood Pressure (mmHg): 148/78 Reference Range: 80 - 120 mg / dl Electronic Signature(s) Signed: 07/02/2021 6:01:10 PM By: Levan Hurst RN, BSN Entered By: Levan Hurst on 07/01/2021 11:43:53

## 2021-07-07 NOTE — Progress Notes (Signed)
[name] ([id number]) Visit Report for 06/11/2021 Allergy List Details Patient Name: Date of Service: [name] 06/11/2021 2:45 PM Medical Record Number: [id number] Patient Account Number: [id number] Date of Birth/Sex: Treating RN: 1946/11/16 (75 y.o. [name] Primary Care Samit Sylve: Yaakov Guthrie Other Clinician: Referring Taraneh Metheney: Treating [name]: [name] Weeks in Treatment: 0 Allergies Active Allergies No Known Allergies Allergy Notes Electronic Signature(s) Signed: 06/16/2021 5:39:04 PM By: Levan Hurst RN, BSN Entered By: Levan Hurst on 06/11/2021 14:56:25 -------------------------------------------------------------------------------- Arrival Information Details Patient Name: Date of Service: Caleb Alken RD C. 06/11/2021 2:45 PM Medical Record Number: [id number] Patient Account Number: [id number] Date of Birth/Sex: Treating RN: Nov 05, 1946 (75 y.o. [name] Primary Care Braelynn Lupton: Yaakov Guthrie Other Clinician: Referring Derral Colucci: Treating Evamarie Raetz/Extender: Leigh Aurora in Treatment: 0 Visit Information Patient Arrived: Ambulatory Arrival Time: 14:48 Accompanied By: wife Transfer Assistance: None Patient Identification Verified: Yes Secondary Verification Process Completed: Yes Patient Requires Transmission-Based Precautions: No Patient Has Alerts: Yes Patient Alerts: Patient on Blood Thinner R ABI: 1.01 TBI: 0.77 L ABI: 1.22 TBI: 0.87 05/01/21 History Since Last Visit Added or deleted any medications: No Any new allergies or adverse reactions: No Had a fall or experienced change in activities of daily living that may affect risk of falls: No Signs or symptoms of abuse/neglect since last visito No Hospitalized since last visit: No Implantable device outside of the clinic excluding cellular tissue based products placed in the center since last visit: No Pain Present Now:  No Electronic Signature(s) Signed: 06/16/2021 5:39:04 PM By: Levan Hurst RN, BSN Entered By: Levan Hurst on 06/11/2021 15:07:05 -------------------------------------------------------------------------------- Clinic Level of Care Assessment Details Patient Name: Date of Service: Caleb Alken RD C. 06/11/2021 2:45 PM Medical Record Number: [id number] Patient Account Number: [id number] Date of Birth/Sex: Treating RN: Oct 18, 1946 (75 y.o. [name], [name] Primary Care Efton Thomley: Yaakov Guthrie Other Clinician: Referring Kalman Nylen: Treating Hardeep Reetz/Extender: Leigh Aurora in Treatment: 0 Clinic Level of Care Assessment Items TOOL 4 Quantity Score X- 1 0 Use when only an EandM is performed on FOLLOW-UP visit ASSESSMENTS - Nursing Assessment / Reassessment X- 1 10 Reassessment of Co-morbidities (includes updates in patient status) X- 1 5 Reassessment of Adherence to Treatment Plan ASSESSMENTS - Wound and Skin A ssessment / Reassessment X - Simple Wound Assessment / Reassessment - one wound 1 5 []  - 0 Complex Wound Assessment / Reassessment - multiple wounds X- 1 10 Dermatologic / Skin Assessment (not related to wound area) ASSESSMENTS - Focused Assessment X- 1 5 Circumferential Edema Measurements - multi extremities []  - 0 Nutritional Assessment / Counseling / Intervention []  - 0 Lower Extremity Assessment (monofilament, tuning fork, pulses) []  - 0 Peripheral Arterial Disease Assessment (using hand held doppler) ASSESSMENTS - Ostomy and/or Continence Assessment and Care []  - 0 Incontinence Assessment and Management []  - 0 Ostomy Care Assessment and Management (repouching, etc.) PROCESS - Coordination of Care X - Simple Patient / Family Education for ongoing care 1 15 []  - 0 Complex (extensive) Patient / Family Education for ongoing care X- 1 10 Staff obtains Programmer, systems, Records, T Results / Process Orders est []  - 0 Staff telephones HHA, Nursing  Homes / Clarify orders / etc []  - 0 Routine Transfer to another Facility (non-emergent condition) []  - 0 Routine Hospital Admission (non-emergent condition) X- 1 15 New Admissions / Biomedical engineer / Ordering NPWT Apligraf, etc. , []  - 0 Emergency Hospital Admission (emergent condition) X- 1 10 Simple Discharge  Coordination []  - 0 Complex (extensive) Discharge Coordination PROCESS - Special Needs []  - 0 Pediatric / Minor Patient Management []  - 0 Isolation Patient Management []  - 0 Hearing / Language / Visual special needs []  - 0 Assessment of Community assistance (transportation, D/C planning, etc.) []  - 0 Additional assistance / Altered mentation []  - 0 Support Surface(s) Assessment (bed, cushion, seat, etc.) INTERVENTIONS - Wound Cleansing / Measurement X- 1 5 Simple Wound Cleansing - one wound []  - 0 Complex Wound Cleansing - multiple wounds X- 1 5 Wound Imaging (photographs - any number of wounds) []  - 0 Wound Tracing (instead of photographs) X- 1 5 Simple Wound Measurement - one wound []  - 0 Complex Wound Measurement - multiple wounds INTERVENTIONS - Wound Dressings X - Small Wound Dressing one or multiple wounds 1 10 []  - 0 Medium Wound Dressing one or multiple wounds []  - 0 Large Wound Dressing one or multiple wounds X- 1 5 Application of Medications - topical []  - 0 Application of Medications - injection INTERVENTIONS - Miscellaneous []  - 0 External ear exam []  - 0 Specimen Collection (cultures, biopsies, blood, body fluids, etc.) []  - 0 Specimen(s) / Culture(s) sent or taken to Lab for analysis []  - 0 Patient Transfer (multiple staff / Civil Service fast streamer / Similar devices) []  - 0 Simple Staple / Suture removal (25 or less) []  - 0 Complex Staple / Suture removal (26 or more) []  - 0 Hypo / Hyperglycemic Management (close monitor of Blood Glucose) []  - 0 Ankle / Brachial Index (ABI) - do not check if billed separately X- 1 5 Vital Signs Has  the patient been seen at the hospital within the last three years: Yes Total Score: 120 Level Of Care: New/Established - Level 4 Electronic Signature(s) Signed: 06/12/2021 6:18:32 PM By: Rhae Hammock RN Entered By: Rhae Hammock on 06/11/2021 16:31:01 -------------------------------------------------------------------------------- Encounter Discharge Information Details Patient Name: Date of Service: Caleb Alken RD C. 06/11/2021 2:45 PM Medical Record Number: [id number] Patient Account Number: [id number] Date of Birth/Sex: Treating RN: March 18, 1946 (75 y.o. Hessie Diener Primary Care Izela Altier: Yaakov Guthrie Other Clinician: Referring Nicholette Dolson: Treating [name] [name]/Extender: Leigh Aurora in Treatment: 0 Encounter Discharge Information Items Discharge Condition: Stable Ambulatory Status: Ambulatory Discharge Destination: Home Transportation: Private Auto Accompanied By: spouse Schedule Follow-up Appointment: No Clinical Summary of Care: Provided Form Type Recipient Paper Patient patient and spouse Electronic Signature(s) Signed: 07/07/2021 9:50:11 PM By: Deon Pilling Previous Signature: 07/06/2021 9:20:18 PM Version By: Deon Pilling Entered By: Deon Pilling on 07/07/2021 21:49:59 -------------------------------------------------------------------------------- Lower Extremity Assessment Details Patient Name: Date of Service: Caleb Alken RD C. 06/11/2021 2:45 PM Medical Record Number: [id number] Patient Account Number: [id number] Date of Birth/Sex: Treating RN: 02-26-1946 (75 y.o. [name] Primary Care Luis Sami: Yaakov Guthrie Other Clinician: Referring Haasini Patnaude: Treating Lorma Heater/Extender: [name] Weeks in Treatment: 0 Edema Assessment Assessed: Shirlyn Goltz: No] [Right: No] Edema: [Left: Ye] [Right: s] Calf Left: Right: Point of Measurement: 34 cm From Medial Instep 40.5 cm Ankle Left: Right: Point of Measurement:  13 cm From Medial Instep 25 cm Vascular Assessment Pulses: Dorsalis Pedis Palpable: [Right:Yes] Electronic Signature(s) Signed: 06/16/2021 5:39:04 PM By: Levan Hurst RN, BSN Entered By: Levan Hurst on 06/11/2021 15:02:38 -------------------------------------------------------------------------------- Multi Wound Chart Details Patient Name: Date of Service: Caleb Alken RD C. 06/11/2021 2:45 PM Medical Record Number: [id number] Patient Account Number: [id number] Date of Birth/Sex: Treating RN: 1946-09-02 (75 y.o. 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] [N/A:N/A] Primary Etiology: [4:Glaucoma, Chronic Obstructive] [N/A:N/A] Comorbid History: [4:Pulmonary Disease (COPD), Sleep Apnea, Hypertension, Neuropathy, Received Chemotherapy, Received Radiation 03/30/2021] [N/A:N/A] Date Acquired: [4:0] [N/A:N/A] Weeks of Treatment: [4:Open] [N/A:N/A] Wound Status: [4:2.5x2.8x0.1] [N/A:N/A] Measurements L x W x D (cm) [4:5.498] [N/A:N/A] A (cm) : rea [4:0.55] [N/A:N/A] Volume (cm) : [4:0.00%] [N/A:N/A] % Reduction in Area: [4:0.00%] [N/A:N/A] % Reduction in Volume: [4:Full Thickness Without Exposed] [N/A:N/A] Classification: [4:Support Structures Medium] [N/A:N/A] Exudate Amount: [4:Serosanguineous] [N/A:N/A] Exudate Type: [4:red, brown] [N/A:N/A] Exudate Color: [4:Flat and Intact] [N/A:N/A] Wound Margin: [4:Medium (34-66%)] [N/A:N/A] Granulation Amount: [4:Pink] [N/A:N/A] Granulation Quality: [4:Medium (34-66%)] [N/A:N/A] Necrotic Amount: [4:Fat Layer (Subcutaneous Tissue): Yes N/A] Exposed Structures: [4:Fascia: No Tendon: No Muscle: No Joint: No Bone: No None] [N/A:N/A] Treatment Notes Wound #4  (Foot) Wound Laterality: Right, Lateral Cleanser Soap and Water Discharge Instruction: May shower and wash wound with dial antibacterial soap and water prior to dressing change. Wound Cleanser Discharge Instruction: Cleanse the wound with wound cleanser prior to applying a clean dressing using gauze sponges, not tissue or cotton balls. Peri-Wound Care Topical Primary Dressing Santyl Ointment Discharge Instruction: Apply nickel thick amount to wound bed as instructed Secondary Dressing Woven Gauze Sponge, Non-Sterile 4x4 in Discharge Instruction: Apply over primary dressing as directed. ABD Pad, 5x9 Discharge Instruction: Apply over primary dressing as directed. Optifoam Non-Adhesive Dressing, 4x4 in Discharge Instruction: cut to make a foam donut and place over primary dresssing for offloading!! Secured With [employer], 4.5x3.1 (in/yd) Discharge Instruction: Secure with Kerlix as directed. 60M Medipore H Soft Cloth Surgical T 4 x 2 (in/yd) ape Discharge Instruction: Secure dressing with tape as directed. Compression Wrap Compression Stockings Add-Ons Electronic Signature(s) Signed: 06/11/2021 5:20:14 PM By: Kalman Shan DO Signed: 06/12/2021 6:18:32 PM By: Rhae Hammock RN Entered By: Kalman Shan on 06/11/2021 16:54:21 -------------------------------------------------------------------------------- Multi-Disciplinary Care Plan Details Patient Name: Date of Service: Caleb Alken RD C. 06/11/2021 2:45 PM Medical Record Number: [id number] Patient Account Number: [id number] Date of Birth/Sex: Treating RN: [birth year] (75 y.o. [name], [name] Primary Care Channon Ambrosini: Yaakov Guthrie Other Clinician: Referring Megean Fabio: Treating Rayonna Heldman/Extender: Leigh Aurora in Treatment: 0 Active Inactive Orientation to the Wound Care Program Nursing Diagnoses: Knowledge deficit related to the wound healing center program Goals: Patient/caregiver  will verbalize understanding of the [facility] Date Initiated: 06/11/2021 Target Resolution Date: 06/11/2021 Goal Status: Active Interventions: Provide education on orientation to the wound center Notes: Wound/Skin Impairment Nursing Diagnoses: Impaired tissue integrity Goals: Patient will have a decrease in wound volume by X% from date: (specify in notes) Date Initiated: 06/11/2021 Target Resolution Date: 06/25/2021 Goal Status: Active Patient/caregiver will verbalize understanding of skin care regimen Date Initiated: 06/11/2021 Target Resolution Date: 06/25/2021 Goal Status: Active Ulcer/skin breakdown will have a volume reduction of 30% by week 4 Date Initiated: 06/11/2021 Target Resolution Date: 06/25/2021 Goal Status: Active Interventions: Assess patient/caregiver ability to obtain necessary supplies Assess patient/caregiver ability to perform ulcer/skin care regimen upon admission and as needed Assess ulceration(s) every visit Notes: Electronic Signature(s) Signed: 06/12/2021 6:18:32 PM By: Rhae Hammock RN Entered By: Rhae Hammock on 06/11/2021 16:16:54 -------------------------------------------------------------------------------- Pain Assessment Details Patient Name: Date of Service: Caleb Alken RD C. 06/11/2021 2:45 PM Medical Record Number: [id number] Patient Account Number: [id number] Date of Birth/Sex: Treating RN: November 14, 1946 (75 y.o. [name] Primary Care Jiovanni Heeter: Yaakov Guthrie Other Clinician: Referring  Alline Pio: Treating Farron Watrous/Extender: Leigh Aurora in Treatment: 0 Active Problems Location of Pain Severity and Description of Pain Patient Has Paino No Site Locations Pain Management and Medication Current Pain Management: Electronic Signature(s) Signed: 06/16/2021 5:39:04 PM By: Levan Hurst RN, BSN Entered By: Levan Hurst on 06/11/2021  15:04:26 -------------------------------------------------------------------------------- Patient/Caregiver Education Details Patient Name: Date of Service: Caleb Alken RD Loletha Everett 6/23/2022andnbsp2:45 PM Medical Record Number: [id number] Patient Account Number: [id number] Date of Birth/Gender: Treating RN: [birth year] (75 y.o. [name], [place] Primary Care Physician: Yaakov Guthrie Other Clinician: Referring Physician: Treating Physician/Extender: Leigh Aurora in Treatment: 0 Education Assessment Education Provided To: Patient Education Topics Provided [place]: o Handouts: Welcome T [facility] o Methods: Explain/Verbal Responses: State content correctly Wound/Skin Impairment: Methods: Explain/Verbal Responses: State content correctly Electronic Signature(s) Signed: 06/12/2021 6:18:32 PM By: Rhae Hammock RN Entered By: Rhae Hammock on 06/11/2021 16:26:15 -------------------------------------------------------------------------------- Wound Assessment Details Patient Name: Date of Service: Caleb Alken RD C. 06/11/2021 2:45 PM Medical Record Number: [id number] Patient Account Number: [id number] Date of Birth/Sex: Treating RN: [birth year] (75 y.o. [name] Primary Care Rea Reser: Yaakov Guthrie Other Clinician: Referring Oseph Imburgia: Treating Oberia Beaudoin/Extender: [name] Weeks in Treatment: 0 Wound Status Wound Number: 4 Primary Neuropathic Ulcer-Non Diabetic Etiology: Wound Location: Right, Lateral Foot Wound Open Wounding Event: Gradually Appeared Status: Date Acquired: 03/30/2021 Comorbid Glaucoma, Chronic Obstructive Pulmonary Disease (COPD), Sleep Weeks Of Treatment: 0 History: Apnea, Hypertension, Neuropathy, Received Chemotherapy, Clustered Wound: No Received Radiation Photos Wound Measurements Length: (cm) 2.5 Width: (cm) 2.8 Depth: (cm) 0.1 Area: (cm) 5.498 Volume:  (cm) 0.55 % Reduction in Area: 0% % Reduction in Volume: 0% Epithelialization: None Tunneling: No Undermining: No Wound Description Classification: Full Thickness Without Exposed Support Structures Wound Margin: Flat and Intact Exudate Amount: Medium Exudate Type: Serosanguineous Exudate Color: red, brown Foul Odor After Cleansing: No Slough/Fibrino Yes Wound Bed Granulation Amount: Medium (34-66%) Exposed Structure Granulation Quality: Pink Fascia Exposed: No Necrotic Amount: Medium (34-66%) Fat Layer (Subcutaneous Tissue) Exposed: Yes Necrotic Quality: Adherent Slough Tendon Exposed: No Muscle Exposed: No Joint Exposed: No Bone Exposed: No Electronic Signature(s) Signed: 06/11/2021 4:12:25 PM By: Sandre Kitty Signed: 06/16/2021 5:39:04 PM By: Levan Hurst RN, BSN Entered By: Sandre Kitty on 06/11/2021 15:53:50 -------------------------------------------------------------------------------- Vitals Details Patient Name: Date of Service: Caleb Alken RD C. 06/11/2021 2:45 PM Medical Record Number: [id number] Patient Account Number: [id number] Date of Birth/Sex: Treating RN: 02-Aug-1946 (75 y.o. [name] Primary Care Devin Foskey: Yaakov Guthrie Other Clinician: Referring Ata Pecha: Treating Deshawn Witty/Extender: [name] Weeks in Treatment: 0 Vital Signs Time Taken: 14:48 Reference Range: 80 - 120 mg / dl Height (in): 69 Source: Stated Weight (lbs): 210 Source: Stated Body Mass Index (BMI): 31 Electronic Signature(s) Signed: 06/16/2021 5:39:04 PM By: Levan Hurst RN, BSN Entered By: Levan Hurst on 06/11/2021 14:56:07

## 2021-07-08 ENCOUNTER — Other Ambulatory Visit: Payer: Self-pay

## 2021-07-08 ENCOUNTER — Encounter (HOSPITAL_BASED_OUTPATIENT_CLINIC_OR_DEPARTMENT_OTHER): Payer: Medicare Other | Admitting: Internal Medicine

## 2021-07-08 DIAGNOSIS — L97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 with unspecified severity: Secondary | ICD-10-CM | POA: Diagnosis not present

## 2021-07-08 NOTE — Progress Notes (Signed)
QUINCE, SANTANA ([id number]) Visit Report for 07/08/2021 HPI Details Patient Name: Date of Service: BREYLON, SHERROW RD [name] 07/08/2021 12:30 PM Medical Record Number: [id number] Patient Account Number: [id number] Date of Birth/Sex: Treating RN: Jan 21, 1946 (75 y.o. [name] Primary Care Provider: Yaakov Guthrie Other Clinician: Referring Provider: Treating Provider/Extender: [name] in Treatment: 3 History of Present Illness HPI Description: Admission 6/23 Mr. 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 He states this started a little over 2 months ago. This is now the third time he has developed a wound to this area. He followed with podiatry for this issue and has been using Betadine daily to it. He recently switched to silver alginate a week ago. He has been on doxycycline since May For this issue and just recently started a course of Keflex prescribed in the ED on 6/18 For possible cellulitis. Currently denies signs of infection. He uses a front offloading shoe to help with relieving the pressure associated with the wound formation. He reports minimal drainage. He does not have sensation to his feet and denies any pain. Currently denies signs of infection. 6/29; this is a patient with chemotherapy-induced neuropathy. We had in clinic last year he was readmitted to our clinic last week. He has an area on the right fifth metatarsal head. Dr. Heber Kiryas Joel admitted to the to the clinic last week started him on Santyl they are using this daily. The wound started in March she went to podiatry. They are using Betadine to this I am not really sure about the benefit of Betadine in this type of situation. There is not appear to be any active infection. He is using a forefoot offloading boot 7/6; wound on the right fifth metatarsal  head. Using Santyl daily. He is using a forefoot offloading boot but claims to be staying off this religiously Again talk to me about a vacation they had planned for mid August. They have a cancellation insurance and want to go ahead and put this in. I asked him to see if there is a specific form they want filled out. It does seem unlikely that he will be healed by that time [August 12]. Furthermore there are apparently no elevators they be going up stairs. 7/13; right fifth met head. Still not a viable surface using Hydrofera Blue and Santyl. Offloading in a forefoot offloading 7/20; right fifth metatarsal head. Surface looks better. Dimensions down by 4 x 3 mm. We will moved to straight [facility] with a total contact cast today His wife asked me to look at some erythema on the left anterior lower leg. It is indeed warm but not tender [however he is neuropathic]. He also has some degree of venous insufficiency I wonder some of this could be stasis dermatitis Electronic Signature(s) Signed: 07/08/2021 4:58:30 PM By: Linton Ham MD Entered By: Linton Ham on 07/08/2021 13:12:15 -------------------------------------------------------------------------------- Physical Exam Details Patient Name: Date of Service: [name] 07/08/2021 12:30 PM Medical Record Number: [id number] Patient Account Number: [id number] Date of Birth/Sex: Treating RN: 1946/05/07 (75 y.o. [name] Primary Care Provider: Yaakov Guthrie Other Clinician: Referring Provider: Treating Provider/Extender: [name] in Treatment: 3 Constitutional Sitting or standing Blood Pressure is within target range for patient.. Pulse regular and within target range for patient.Marland Kitchen Respirations regular, non-labored and within target range.Marland Kitchen  Temperature is normal and within the target range for the patient.Marland Kitchen Appears in no distress. Notes Wound exam; the patient surface looks better. No  debridement today. Dimensions are down rim of epithelialization no evidence of surrounding infection He has an area of erythema on the distal anterior tibial on the left. This is warm but not particularly tender. Electronic Signature(s) Signed: 07/08/2021 4:58:30 PM By: Linton Ham MD Entered By: Linton Ham on 07/08/2021 13:13:43 -------------------------------------------------------------------------------- Physician Orders Details Patient Name: Date of Service: [name] 07/08/2021 12:30 PM Medical Record Number: [id number] Patient Account Number: [id number] Date of Birth/Sex: Treating RN: [birth year] (75 y.o. 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 - with Dr. Dellia Nims Return A ppointment in: - Friday 7/22 for cast change Return A Bathing/ Shower/ Hygiene May shower with protection but do not get wound dressing(s) wet. Edema Control - Lymphedema / SCD / Other Elevate legs to the level of the heart or above for 30 minutes daily and/or when sitting, a frequency of: - throughout the day Avoid standing for long periods of time. Off-Loading Total Contact Cast to Right Lower Extremity Wound Treatment Wound #4 - Foot Wound Laterality: Right, Lateral Cleanser: Soap and Water 1 x Per Week/7 Days Discharge Instructions: May shower and wash wound with dial antibacterial soap and water prior to dressing change. Cleanser: Wound Cleanser (Generic) 1 x Per Week/7  Days Discharge Instructions: Cleanse the wound with wound cleanser prior to applying a clean dressing using gauze sponges, not tissue or cotton balls. Prim Dressing: Hydrofera Blue Classic Foam, 2x2 in (Generic) 1 x Per Week/7 Days ary Discharge Instructions: Moisten with saline prior to applying to wound bed. Apply over Santyl. Secondary Dressing: Woven Gauze Sponge, Non-Sterile 4x4 in (Generic) 1 x Per Week/7 Days Discharge Instructions: Apply over primary dressing as directed. Secondary Dressing: Drawtex 4x4 in 1 x Per Week/7 Days Discharge Instructions: Apply over primary dressing as directed. Secured With: 19M Medipore H Soft Cloth Surgical Tape, 2x2 (in/yd) (Generic) 1 x Per Week/7 Days Discharge Instructions: Secure dressing with tape as directed. Patient Medications llergies: No Known Allergies A Notifications Medication Indication Start End cellulits left leg 07/08/2021 cephalexin DOSE oral 250 mg/5 mL suspension for reconstitution - suspension for reconstitution oral 567m (137m q6h for 5 days Electronic Signature(s) Signed: 07/08/2021 1:15:49 PM By: RoLinton HamD Entered By: RoLinton Hamn 07/08/2021 13:15:49 Prescription 07/08/2021 -------------------------------------------------------------------------------- SMFrancene Finders. RoLinton HamD Patient Name: Provider: 12/[id number] of Birth: NPI#: [id number] [id number]: DEA #: [id number] #: License #: MoBoydsatient Address: [address] [place] [id number] [id number] No Known Allergies Medication Medication: Route: Strength: Form: cephalexin oral 250 mg/5 mL suspension for reconstitution Class: CEPHALOSPORIN ANTIBIOTICS - 1ST GENERATION Dose: Frequency / Time: Indication: suspension for reconstitution oral 50013m33m36m6h for 5 days cellulits left leg Number  of Refills: Number of Units: 0 Two Hundred (200) Milliliter(s) Generic Substitution: Start Date: End Date: Administered at Facility: Substitution Permitted 7/206/38/4665Note to Pharmacy: Hand Signature:  Date(s): Electronic Signature(s) Signed: 07/08/2021 4:58:30 PM By: Linton Ham MD Entered By: Linton Ham on 07/08/2021 13:15:49 -------------------------------------------------------------------------------- Problem List Details Patient Name: Date of Service: [name] 07/08/2021 12:30 PM Medical Record Number: [id number] Patient Account Number: [id number] Date of Birth/Sex: Treating RN: 1946/09/26 (75 y.o. 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 06/11/2021 No Yes J44.9 Chronic obstructive pulmonary disease, unspecified 06/11/2021 No Yes G47.33 Obstructive sleep apnea (adult) (pediatric) 06/11/2021 No Yes Z86.718 Personal history of other venous thrombosis and embolism 06/11/2021 No Yes L03.116 Cellulitis of left lower limb 07/08/2021 No Yes Inactive Problems Resolved Problems Electronic Signature(s) Signed: 07/08/2021 4:58:30 PM By: Linton Ham MD Entered By: Linton Ham on 07/08/2021 13:06:30 -------------------------------------------------------------------------------- Total Contact Cast Details Patient Name: Date of Service: [name] 07/08/2021 12:30 PM Medical Record Number: [id number] Patient Account Number: [id number] Date of Birth/Sex: Treating RN: 1945/12/22 (74 y.o. [name] Primary Care Provider: Yaakov Guthrie Other Clinician: Referring Provider: Treating Provider/Extender: [name] in Treatment:  3 T Contact Cast Applied for Wound Assessment: otal Wound #4 Right,Lateral Foot Performed By: Physician Ricard Dillon., MD Post Procedure Diagnosis Same as Pre-procedure Electronic Signature(s) Signed: 07/08/2021 4:58:30 PM By: Linton Ham MD Entered By: Linton Ham on 07/08/2021 13:08:17 -------------------------------------------------------------------------------- SuperBill Details Patient Name: Date of Service: [name] 07/08/2021 Medical Record Number: [id number] Patient Account Number: [id number] Date of Birth/Sex: Treating RN: 06-17-1946 (75 y.o. 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 J44.9 Chronic obstructive pulmonary disease, unspecified G47.33 Obstructive sleep apnea (adult) (pediatric) Z86.718 Personal history of other venous thrombosis and embolism L03.116 Cellulitis of left lower limb Facility Procedures CPT4 Code: [id number] Description: [phone] - APPLY TOTAL CONTACT LEG CAST ICD-10 Diagnosis Description 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

## 2021-07-10 ENCOUNTER — Encounter (HOSPITAL_BASED_OUTPATIENT_CLINIC_OR_DEPARTMENT_OTHER): Payer: Medicare Other | Admitting: Internal Medicine

## 2021-07-10 ENCOUNTER — Other Ambulatory Visit: Payer: Self-pay

## 2021-07-10 DIAGNOSIS — L97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 with unspecified severity: Secondary | ICD-10-CM | POA: Diagnosis not present

## 2021-07-13 NOTE — Progress Notes (Signed)
PHILL, STECK ([id number]) Visit Report for 07/10/2021 Chief Complaint Document Details Patient Name: Date of Service: [name], [name] 07/10/2021 11:30 A M Medical Record Number: [id number] Patient Account Number: [id number] Date of Birth/Sex: Treating RN: Jun 23, 1946 (75 y.o. [name] Primary Care Provider: Yaakov Everett Other Clinician: Referring Provider: Treating Provider/Extender: [name] in Treatment: 4 Information Obtained from: Patient Chief Complaint Right foot wound Electronic Signature(s) Signed: 07/10/2021 3:17:49 PM By: Caleb Shan DO Entered By: [name] on 07/10/2021 15:14:01 -------------------------------------------------------------------------------- HPI Details Patient Name: Date of Service: Caleb Alken RD C. 07/10/2021 11:30 A M Medical Record Number: [id number] Patient Account Number: [id number] Date of Birth/Sex: Treating RN: 11/02/1946 (75 y.o. 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 He states this started a little over 2 months ago. This is now the third time he has developed a wound to this area. He followed with podiatry for this issue and has been using Betadine daily to it. He recently switched to silver alginate a week ago. He has been on doxycycline since May For this issue and just recently started a course of Keflex prescribed in the ED on 6/18 For possible cellulitis. Currently denies signs of infection. He uses a front offloading shoe to help with relieving the pressure associated with the wound formation. He reports  minimal drainage. He does not have sensation to his feet and denies any pain. Currently denies signs of infection. 6/29; this is a patient with chemotherapy-induced neuropathy. We had in clinic last year he was readmitted to our clinic last week. He has an area on the right fifth metatarsal head. Dr. Heber Hoxie admitted to the to the clinic last week started him on Santyl they are using this daily. The wound started in March she went to podiatry. They are using Betadine to this I am not really sure about the benefit of Betadine in this type of situation. There is not appear to be any active infection. He is using a forefoot offloading boot 7/6; wound on the right fifth metatarsal head. Using Santyl daily. He is using a forefoot offloading boot but claims to be staying off this religiously Again talk to me about a vacation they had planned for mid August. They have a cancellation insurance and want to go ahead and put this in. I asked him to see if there is a specific form they want filled out. It does seem unlikely that he will be healed by that time [August 12]. Furthermore there are apparently no elevators they be going up stairs. 7/13; right fifth met head. Still not a viable surface using Hydrofera Blue and Santyl. Offloading in a forefoot offloading 7/20; right fifth metatarsal head. Surface looks better. Dimensions down by 4 x 3 mm. We will moved to straight [facility] with a total contact cast today His wife asked me to look at some erythema on the left anterior lower leg. It is indeed warm but not tender [however he is neuropathic]. He also has some degree of venous insufficiency I wonder some of this could be stasis dermatitis 7/22; patient presents for obligatory cast change. He reports not issues with the cast placed  earlier this week. He tolerated this well. He has no complaints or questions today. Electronic Signature(s) Signed: 07/10/2021 3:17:49 PM By: Caleb Shan DO Entered By:  [name] on 07/10/2021 15:15:13 -------------------------------------------------------------------------------- Physical Exam Details Patient Name: Date of Service: Caleb Alken RD C. 07/10/2021 11:30 A M Medical Record Number: [id number] Patient Account Number: [id number] Date of Birth/Sex: Treating RN: April 05, 1946 (75 y.o. [name] Primary Care Provider: Yaakov Everett Other Clinician: Referring Provider: Treating Provider/Extender: [name] in Treatment: 4 Constitutional respirations regular, non-labored and within target range for patient.Marland Kitchen Psychiatric pleasant and cooperative. Notes Right foot: T the lateral aspect there is an open wound with granulation tissue and callus circumferentially. No signs of infection. o Electronic Signature(s) Signed: 07/10/2021 3:17:49 PM By: Caleb Shan DO Entered By: [name] on 07/10/2021 15:16:03 -------------------------------------------------------------------------------- Physician Orders Details Patient Name: Date of Service: Caleb Alken RD C. 07/10/2021 11:30 A M Medical Record Number: [id number] Patient Account Number: [id number] Date of Birth/Sex: Treating RN: November 23, 1946 (75 y.o. 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 - with Dr. Dellia Nims 7/27 Return A Bathing/ Shower/ Hygiene May shower with protection but do not get wound  dressing(s) wet. Edema Control - Lymphedema / SCD / Other Elevate legs to the level of the heart or above for 30 minutes daily and/or when sitting, a frequency of: - throughout the day Avoid standing for long periods of time. Off-Loading Total Contact Cast to Right Lower Extremity Wound Treatment Wound #4 - Foot Wound Laterality: Right, Lateral Cleanser: Soap and Water 1 x Per Week/7 Days Discharge Instructions: May shower and wash wound with dial antibacterial soap and water prior to dressing change. Cleanser: Wound Cleanser (Generic) 1 x Per Week/7 Days Discharge Instructions: Cleanse the wound with wound cleanser prior to applying a clean dressing using gauze sponges, not tissue or cotton balls. Prim Dressing: Hydrofera Blue Classic Foam, 2x2 in (Generic) 1 x Per Week/7 Days ary Discharge Instructions: Moisten with saline prior to applying to wound bed. Apply over Santyl. Secondary Dressing: Woven Gauze Sponge, Non-Sterile 4x4 in (Generic) 1 x Per Week/7 Days Discharge Instructions: Apply over primary dressing as directed. Secondary Dressing: Drawtex 4x4 in 1 x Per Week/7 Days Discharge Instructions: Apply over primary dressing as directed. Secured With: 29M Medipore H Soft Cloth Surgical Tape, 2x2 (in/yd) (Generic) 1 x Per Week/7 Days Discharge Instructions: Secure dressing with tape as directed. Electronic Signature(s) Signed: 07/10/2021 3:17:49 PM By: Caleb Shan DO Previous Signature: 07/10/2021 1:37:28 PM Version By: Baruch Gouty RN, BSN Entered By: [name] on 07/10/2021 15:16:16 -------------------------------------------------------------------------------- Problem List Details Patient Name: Date of Service: Caleb Alken RD C. 07/10/2021 11:30 A M Medical Record Number: [id number] Patient Account Number: [id number] Date of Birth/Sex: Treating RN: 28-Oct-1946 (75 y.o. 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 06/11/2021 No Yes J44.9 Chronic obstructive pulmonary disease, unspecified 06/11/2021  No Yes G47.33 Obstructive sleep apnea (adult) (pediatric) 06/11/2021 No Yes Z86.718 Personal history of other venous thrombosis and embolism 06/11/2021 No Yes L03.116 Cellulitis of left lower limb 07/08/2021 No Yes Inactive Problems Resolved Problems Electronic Signature(s) Signed: 07/10/2021 3:17:49 PM By: Caleb Shan DO Previous Signature: 07/10/2021 1:37:28 PM Version By: Baruch Gouty RN, BSN Entered By: [name] on 07/10/2021 15:13:46 -------------------------------------------------------------------------------- Progress Note Details Patient Name: Date of Service: Caleb Alken RD C. 07/10/2021 11:30 A M Medical Record Number: [id number] Patient Account Number: [id number] Date of Birth/Sex: Treating RN: November 06, 1946 (75 y.o. 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 He states this started a little over 2 months ago. This is now the third time he has developed a wound to this area. He followed with podiatry for this issue and has been using Betadine daily to it. He recently switched to silver alginate a week ago. He has been on doxycycline since May For this issue and just recently started  a course of Keflex prescribed in the ED on 6/18 For possible cellulitis. Currently denies signs of infection. He uses a front offloading shoe to help with relieving the pressure associated with the wound formation. He reports minimal drainage. He does not have sensation to his feet and denies any pain. Currently denies signs of infection. 6/29; this is a patient with chemotherapy-induced neuropathy. We had in clinic last year he was readmitted to our clinic last week. He has an area on the right fifth metatarsal head. Dr. Heber [place] admitted to the to the clinic last week started him on Santyl they are using this daily. The wound started in March she went to podiatry. They are using Betadine to this I am not really sure about the benefit of Betadine in this type of situation. There is not appear to be any active infection. He is using a forefoot offloading boot 7/6; wound on the right fifth metatarsal head. Using Santyl daily. He is using a forefoot offloading boot but claims to be staying off this religiously Again talk to me about a vacation they had planned for mid August. They have a cancellation insurance and want to go ahead and put this in. I asked him to see if there is a specific form they want filled out. It does seem unlikely that he will be healed by that time [August 12]. Furthermore there are apparently no elevators they be going up stairs. 7/13; right fifth met head. Still not a viable surface using Hydrofera Blue and Santyl. Offloading in a forefoot offloading 7/20; right fifth metatarsal head. Surface looks better. Dimensions down by 4 x 3 mm. We will moved to straight [facility] with a total contact cast today His wife asked me to look at some erythema on the left anterior lower leg. It is indeed warm but not tender [however he is neuropathic]. He also has some degree of venous insufficiency I wonder some of this could be stasis dermatitis 7/22; patient presents for obligatory cast  change. He reports not issues with the cast placed earlier this week. He tolerated this well. He has no complaints or questions today.  Patient History Information obtained from Patient. Family History Cancer - Mother, Heart Disease - Father,Siblings, Hypertension - Mother,Father,Siblings, No family history of Diabetes, Hereditary Spherocytosis, Kidney Disease, Lung Disease, Seizures, Stroke, Thyroid Problems, Tuberculosis. Social History Former smoker - quit 2005, Marital Status - Married, Alcohol Use - Daily - bottle wine per day, Drug Use - No History, Caffeine Use - Daily - coffee. Medical History Eyes Patient has history of Glaucoma - mild Denies history of Cataracts, Optic Neuritis Respiratory Patient has history of Chronic Obstructive Pulmonary Disease (COPD), Sleep Apnea - no CPAP Cardiovascular Patient has history of Hypertension Neurologic Patient has history of Neuropathy Oncologic Patient has history of Received Chemotherapy, Received Radiation Psychiatric Denies history of Anorexia/bulimia, Confinement Anxiety Hospitalization/Surgery History - left inguinal hernia repair. - lobectomy right and left lung. - mouth cancer surgery. - tonsilectomy. 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 Vitals Time Taken: 12:15 PM, Height: 69 in, Weight: 210 lbs, BMI: 31, Temperature: 98.3 F, Pulse: 79 bpm, Respiratory Rate: 20 breaths/min, Blood Pressure: 155/82 mmHg. Psychiatric pleasant and cooperative. General Notes: Right foot: T the lateral aspect there is an open wound with granulation tissue and callus circumferentially. No signs of  infection. o Integumentary (Hair, Skin) Wound #4 status is Open. Original cause of wound was Gradually Appeared. The date acquired was: 03/30/2021. The wound has been in treatment 4 weeks. The wound is located on the Right,Lateral Foot. The wound measures 1.1cm length x 2.2cm width x 0.1cm depth; 1.901cm^2 area and 0.19cm^3 volume. There is Fat Layer (Subcutaneous Tissue) exposed. There is no tunneling or undermining noted. There is a medium amount of serosanguineous drainage noted. The wound margin is flat and intact. There is large (67-100%) pink, friable granulation within the wound bed. There is no necrotic tissue within the wound bed. 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 No issues arose from this. Wound looks stable. Cast was replaced today. Procedures Wound #4 Pre-procedure diagnosis of Wound #4 is a Neuropathic Ulcer-Non Diabetic located on the Right,Lateral Foot . There was a T Programmer, multimedia Procedure by Cephas Darby, Lejend Dalby, DO. Post procedure Diagnosis Wound #4: Same as Pre-Procedure Plan Follow-up Appointments: Return Appointment in 1 week. - with Dr. Dellia Nims 7/27 Bathing/ Shower/ Hygiene: May shower with protection but do not get wound dressing(s) wet. Edema Control - Lymphedema / SCD / Other: Elevate legs to the level of the heart or above for 30 minutes daily and/or when sitting, a frequency of: - throughout the day Avoid standing for long periods of time. Off-Loading: T Contact Cast to Right Lower Extremity otal WOUND #4: - Foot Wound Laterality: Right, Lateral Cleanser: Soap and Water 1 x Per Week/7 Days Discharge Instructions: May shower and wash wound with dial  antibacterial soap and water prior to dressing change. Cleanser: Wound Cleanser (Generic) 1 x Per Week/7 Days Discharge Instructions: Cleanse the wound with wound cleanser prior to applying a clean dressing using gauze sponges, not tissue or cotton balls. Prim Dressing: Hydrofera Blue Classic Foam, 2x2 in (Generic) 1 x  Per Week/7 Days ary Discharge Instructions: Moisten with saline prior to applying to wound bed. Apply over Santyl. Secondary Dressing: Woven Gauze Sponge, Non-Sterile 4x4 in (Generic) 1 x Per Week/7 Days Discharge Instructions: Apply over primary dressing as directed. Secondary Dressing: Drawtex 4x4 in 1 x Per Week/7 Days Discharge Instructions: Apply over primary dressing as directed. Secured With: 4M Medipore H Soft Cloth Surgical T ape, 2x2 (in/yd) (Generic) 1 x Per Week/7 Days Discharge Instructions: Secure dressing with tape as directed. 1. T contact cast replaced otal 2. Follow-up in 1 week Electronic Signature(s) Signed: 07/10/2021 3:17:49 PM By: Caleb Shan DO Entered By: [name] on 07/10/2021 15:16:59 -------------------------------------------------------------------------------- HxROS Details Patient Name: Date of Service: Caleb Alken RD C. 07/10/2021 11:30 A M Medical Record Number: [id number] Patient Account Number: [id number] Date of Birth/Sex: Treating RN: 03-Aug-1946 (75 y.o. 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 Gastrointestinal Medical History: Past Medical History Notes: ischemic colitis Musculoskeletal Medical History: Past Medical History Notes: hx polio as child Neurologic Medical History: Positive for: Neuropathy Oncologic Medical History: Positive for: Received Chemotherapy; Received Radiation Past Medical History Notes: hx lung CA right and left lung, hx mouth CA Psychiatric Medical History: Negative for: Anorexia/bulimia; Confinement Anxiety HBO Extended History Items Eyes: Glaucoma Immunizations Pneumococcal Vaccine: Received Pneumococcal Vaccination: Yes Implantable Devices Yes Hospitalization / Surgery History Type of Hospitalization/Surgery left inguinal hernia repair lobectomy right and left lung mouth cancer surgery tonsilectomy Family and Social History Cancer: Yes - Mother; Diabetes: No; Heart Disease: Yes - Father,Siblings; Hereditary Spherocytosis: No; Hypertension: Yes - Mother,Father,Siblings; Kidney Disease: No; Lung Disease: No; Seizures: No; Stroke: No; Thyroid Problems: No; Tuberculosis: No; Former smoker - quit 2005; Marital Status - Married; Alcohol Use: Daily - bottle wine per day; Drug Use: No History; Caffeine Use: Daily - coffee; Financial Concerns: No; Food, Clothing or Shelter Needs: No; Support System Lacking: No; Transportation Concerns: No Electronic Signature(s) Signed: 07/10/2021 3:17:49 PM By: Caleb Shan DO Signed: 07/13/2021 4:44:37 PM By: Baruch Gouty RN, BSN Entered By: [name] on 07/10/2021 15:15:21 -------------------------------------------------------------------------------- Total Contact Cast Details Patient Name: Date of Service: Caleb Alken RD C. 07/10/2021 11:30 A M Medical Record Number: [id number] Patient Account Number: [id number] Date of Birth/Sex: Treating RN: 09/19/1946 (75 y.o. [name] Primary Care Provider: Yaakov Everett Other Clinician: Referring  Provider: Treating Provider/Extender: [name] in Treatment: 4 T Contact Cast Applied for Wound Assessment: otal Wound #4 Right,Lateral Foot Performed By: Physician Caleb Shan, DO Post Procedure Diagnosis Same as Pre-procedure Electronic Signature(s) Signed: 07/10/2021 1:37:28 PM By: Baruch Gouty RN, BSN Signed: 07/10/2021 3:17:49 PM By: Caleb Shan DO Entered By: Baruch Gouty on 07/10/2021 28:00:34 -------------------------------------------------------------------------------- Outlook Details Patient Name: Date of Service: Caleb Alken RD C. 07/10/2021 Medical Record Number: [id number] Patient Account Number: [id number] Date of Birth/Sex: Treating RN: 1946/01/25 (75 y.o. 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 J44.9  Chronic obstructive pulmonary disease, unspecified G47.33 Obstructive sleep apnea (adult) (pediatric) Z86.718 Personal history of other venous thrombosis and embolism L03.116 Cellulitis of left lower limb Facility Procedures CPT4 Code: [id number] Description: [phone] - APPLY TOTAL CONTACT LEG CAST ICD-10 Diagnosis Description 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

## 2021-07-13 NOTE — Progress Notes (Signed)
[name], [name] ([id number]) Visit Report for 07/08/2021 Arrival Information Details Patient Name: Date of Service: [name], Caleb RD C. 07/08/2021 12:30 PM Medical Record Number: [id number] Patient Account Number: [id number] Date of Birth/Sex: Treating RN: [birth year] (75 y.o. [name] Primary Care Almer Littleton: Yaakov Guthrie Other Clinician: Referring Hoover Grewe: Treating Junita Kubota/Extender: [name] in Treatment: 3 Visit Information History Since Last Visit Added or deleted any medications: No Patient Arrived: Ambulatory Any new allergies or adverse reactions: No Arrival Time: 12:41 Had a fall or experienced change in No Accompanied By: self activities of daily living that may affect Transfer Assistance: None risk of falls: Patient Identification Verified: Yes Signs or symptoms of abuse/neglect since last visito No Secondary Verification Process Completed: Yes Hospitalized since last visit: No Patient Requires Transmission-Based Precautions: No Implantable device outside of the clinic excluding No Patient Has Alerts: Yes cellular tissue based products placed in the center Patient Alerts: Patient on Blood Thinner since last visit: R ABI: 1.01 TBI: 0.77 Has Dressing in Place as Prescribed: Yes L ABI: 1.22 TBI: 0.87 Pain Present Now: No 05/01/21 Electronic Signature(s) Signed: 07/10/2021 10:49:45 AM By: Sandre Kitty Entered By: Sandre Kitty on 07/08/2021 12:41:34 -------------------------------------------------------------------------------- Encounter Discharge Information Details Patient Name: Date of Service: [name] RD C. 07/08/2021 12:30 PM Medical Record Number: [id number] Patient Account Number: [id number] Date of Birth/Sex: Treating RN: 05/10/1946 (75 y.o. [name], [name] Primary Care [place] Throckmorton: Yaakov Guthrie Other Clinician: Referring Noura Purpura: Treating Cherylynn Liszewski/Extender: [name] in Treatment:  3 Encounter Discharge Information Items Discharge Condition: Stable Ambulatory Status: Ambulatory Discharge Destination: Home Transportation: Private Auto Accompanied By: self Schedule Follow-up Appointment: Yes Clinical Summary of Care: Patient Declined Electronic Signature(s) Signed: 07/09/2021 7:48:44 PM By: Rhae Hammock RN Entered By: Rhae Hammock on 07/08/2021 16:41:38 -------------------------------------------------------------------------------- Lower Extremity Assessment Details Patient Name: Date of Service: [name] RD C. 07/08/2021 12:30 PM Medical Record Number: [id number] Patient Account Number: [id number] Date of Birth/Sex: Treating RN: [birth year] (75 y.o. [name] Primary Care Rodriquez Thorner: Yaakov Guthrie Other Clinician: Referring Shiah Berhow: Treating Latayna Ritchie/Extender: [name] in Treatment: 3 Edema Assessment Assessed: Shirlyn Goltz: No] Patrice Paradise: No] Edema: [Left: Ye] [Right: s] Calf Left: Right: Point of Measurement: 34 cm From Medial Instep 39 cm Ankle Left: Right: Point of Measurement: 13 cm From Medial Instep 24.5 cm Electronic Signature(s) Signed: 07/13/2021 4:44:07 PM By: Levan Hurst RN, BSN Entered By: Levan Hurst on 07/08/2021 12:56:34 -------------------------------------------------------------------------------- Multi Wound Chart Details Patient Name: Date of Service: [name] RD C. 07/08/2021 12:30 PM Medical Record Number: [id number] Patient Account Number: [id number] Date of Birth/Sex: Treating RN: [birth year] (75 y.o. 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] [N/A:N/A] Primary Etiology: [4:Glaucoma, Chronic Obstructive] [N/A:N/A] Comorbid History: [4:Pulmonary Disease (COPD), Sleep Apnea, Hypertension, Neuropathy, Received Chemotherapy, Received Radiation 03/30/2021] [N/A:N/A] Date Acquired: [4:3] [N/A:N/A] Weeks of Treatment: [4:Open] [N/A:N/A] Wound Status: [4:1.2x2x0.1] [N/A:N/A] Measurements L x W x D (cm) [4:1.885] [N/A:N/A] A (cm) : rea [4:0.188] [N/A:N/A] Volume (cm) : [4:65.70%] [N/A:N/A] % Reduction in Area: [4:65.80%] [N/A:N/A] % Reduction in Volume: [4:Full Thickness Without Exposed] [N/A:N/A] Classification: [4:Support Structures Medium] [N/A:N/A] Exudate Amount: [4:Serosanguineous] [N/A:N/A] Exudate Type: [4:red, brown] [N/A:N/A] Exudate Color: [4:Flat and  Intact] [N/A:N/A] Wound Margin: [4:Large (67-100%)] [N/A:N/A] Granulation Amount: [4:Pink] [N/A:N/A] Granulation Quality: [4:Small (1-33%)] [N/A:N/A] Necrotic Amount: [4:Fat Layer (Subcutaneous Tissue): Yes N/A] Exposed Structures: [4:Fascia: No Tendon: No Muscle: No Joint: No Bone: No Small (1-33%)] [N/A:N/A] Epithelialization: [4:T Contact Cast otal] [N/A:N/A] Treatment Notes Electronic Signature(s) Signed: 07/08/2021 4:58:30 PM By: Linton Ham MD Signed: 07/13/2021 4:44:07 PM By: Levan Hurst RN, BSN Entered By: Linton Ham on 07/08/2021 13:08:06 -------------------------------------------------------------------------------- Multi-Disciplinary Care Plan Details Patient Name: Date of Service: [name] RD C. 07/08/2021 12:30 PM Medical Record Number: [id number] Patient Account Number: [id number] Date of Birth/Sex: Treating RN: [birth year] (75 y.o. [name] Primary Care Tavion Senkbeil: Yaakov Guthrie Other Clinician: Referring Dawnn Nam: Treating Latha Staunton/Extender: [name] in Treatment: 3 Active  Inactive Wound/Skin Impairment Nursing Diagnoses: Impaired tissue integrity Goals: Patient/caregiver will verbalize understanding of skin care regimen Date Initiated: 06/11/2021 Target Resolution Date: 07/17/2021 Goal Status: Active Ulcer/skin breakdown will have a volume reduction of 30% by week 4 Date Initiated: 06/11/2021 Target Resolution Date: 07/17/2021 Goal Status: Active Interventions: Assess patient/caregiver ability to obtain necessary supplies Assess patient/caregiver ability to perform ulcer/skin care regimen upon admission and as needed Assess ulceration(s) every visit Notes: Electronic Signature(s) Signed: 07/13/2021 4:44:07 PM By: Levan Hurst RN, BSN Entered By: Levan Hurst on 07/08/2021 18:05:03 -------------------------------------------------------------------------------- Pain Assessment Details Patient Name: Date of Service: [name] RD C. 07/08/2021 12:30 PM Medical Record Number: [id number] Patient Account Number: [id number] Date of Birth/Sex: Treating RN: 11-12-1946 (75 y.o. [name] Primary Care Judianne Seiple: Yaakov Guthrie Other Clinician: Referring Marianela Mandrell: Treating Addy Mcmannis/Extender: [name] in Treatment: 3 Active Problems Location of Pain Severity and Description of Pain Patient Has Paino No Site Locations Pain Management and Medication Current Pain Management: Electronic Signature(s) Signed: 07/10/2021 10:49:45 AM By: Sandre Kitty Signed: 07/13/2021 4:44:07 PM By: Levan Hurst RN, BSN Entered By: Sandre Kitty on 07/08/2021 12:42:16 -------------------------------------------------------------------------------- Patient/Caregiver Education Details Patient Name: Date of Service: [name] RD C. 7/20/2022andnbsp12:30 PM Medical Record Number: [id number] Patient Account Number: [id number] Date of Birth/Gender: Treating RN: 07/21/1946 (75 y.o. [name] Primary Care Physician: Yaakov Guthrie Other Clinician: Referring Physician: Treating Physician/Extender: [name] in Treatment: 3 Education Assessment Education Provided To: Patient Education Topics Provided Wound/Skin Impairment: Methods: Explain/Verbal Responses: State content correctly Electronic Signature(s) Signed: 07/13/2021 4:44:07 PM By: Levan Hurst RN, BSN Entered By: Levan Hurst on 07/08/2021 18:05:13 -------------------------------------------------------------------------------- Wound Assessment Details Patient Name: Date of Service: [name] RD C. 07/08/2021 12:30 PM Medical Record Number: [id number] Patient Account Number: [id number] Date of Birth/Sex: Treating RN: 14-Jan-1946 (75 y.o. [name] Primary Care Nickie Warwick: Yaakov Guthrie Other Clinician: Referring Elvera Almario: Treating Leanza Shepperson/Extender: [name] in Treatment: 3 Wound Status Wound Number: 4 Primary Neuropathic Ulcer-Non Diabetic Etiology: Wound Location: Right, Lateral Foot Wound Open Wounding Event: Gradually Appeared Status: Date Acquired: 03/30/2021 Comorbid Glaucoma, Chronic Obstructive Pulmonary Disease (COPD), Sleep Weeks Of Treatment: 3 History: Apnea, Hypertension, Neuropathy, Received Chemotherapy, Clustered Wound: No Received Radiation Photos Photo Uploaded By: Donavan Burnet on 07/09/2021 13:52:52 Wound Measurements Length: (cm) 1.2 Width: (cm) 2 Depth: (cm) 0.1 Area: (cm) 1.885 Volume: (cm) 0.188 % Reduction in Area: 65.7% % Reduction in Volume: 65.8% Epithelialization: Small (1-33%) Tunneling: No Undermining: No Wound Description Classification: Full Thickness Without Exposed Support Structures Wound Margin: Flat and Intact Exudate Amount: Medium Exudate Type: Serosanguineous Exudate Color: red, brown Foul Odor After Cleansing: No Slough/Fibrino Yes Wound Bed Granulation Amount: Large (67-100%) Exposed Structure Granulation  Quality: Pink Fascia Exposed: No Necrotic Amount: Small (1-33%) Fat Layer (Subcutaneous Tissue) Exposed: Yes Necrotic Quality: Adherent Slough Tendon Exposed: No Muscle Exposed: No Joint Exposed: No Bone Exposed: No Electronic Signature(s) Signed: 07/13/2021 4:44:07 PM By: Levan Hurst RN, BSN Entered By: Levan Hurst on 07/08/2021 12:57:07 -------------------------------------------------------------------------------- [place] Details Patient Name: Date of Service: [name] RD C. 07/08/2021 12:30 PM Medical Record Number: [id number] Patient Account Number: [id number] Date of Birth/Sex: Treating RN: 07/02/1946 (75 y.o. [name] Primary Care Jakin Pavao: Yaakov Guthrie Other Clinician: Referring Va Broadwell: Treating Evrett Hakim/Extender: [name] in Treatment: 3 Vital Signs Time Taken: 12:41 Temperature (F): 98.3 Height (in): 69 Pulse (bpm): 86 Weight (lbs): 210 Respiratory Rate (breaths/min): 18 Body Mass Index (BMI): 31 Blood Pressure (mmHg): 143/77 Reference Range: 80 - 120 mg / dl Electronic Signature(s) Signed: 07/10/2021 10:49:45 AM By: Sandre Kitty Entered By: Sandre Kitty on 07/08/2021 12:42:06

## 2021-07-13 NOTE — Progress Notes (Signed)
[name], [name] ([id number]) Visit Report for 07/10/2021 Arrival Information Details Patient Name: Date of Service: [name], Caleb RD C. 07/10/2021 11:30 A M Medical Record Number: [id number] Patient Account Number: [id number] Date of Birth/Sex: Treating RN: [birth year] (75 y.o. [name], Meta.Reding Primary Care Shirlie Enck: Yaakov Guthrie Other Clinician: Referring Geanna Divirgilio: Treating Gyan Cambre/Extender: Leigh Aurora in Treatment: 4 Visit Information History Since Last Visit Added or deleted any medications: No Patient Arrived: Ambulatory Any new allergies or adverse reactions: No Arrival Time: 12:15 Had a fall or experienced change in No Accompanied By: wife activities of daily living that may affect Transfer Assistance: None risk of falls: Patient Identification Verified: Yes Signs or symptoms of abuse/neglect since last visito No Secondary Verification Process Completed: Yes Hospitalized since last visit: No Patient Requires Transmission-Based Precautions: No Implantable device outside of the clinic excluding No Patient Has Alerts: Yes cellular tissue based products placed in the center Patient Alerts: Patient on Blood Thinner since last visit: R ABI: 1.01 TBI: 0.77 Has Footwear/Offloading in Place as Prescribed: Yes L ABI: 1.22 TBI: 0.87 Right: T Contact Cast otal 05/01/21 Pain Present Now: No Electronic Signature(s) Signed: 07/10/2021 2:18:48 PM By: Deon Pilling Entered By: Deon Pilling on 07/10/2021 12:31:59 -------------------------------------------------------------------------------- Encounter Discharge Information Details Patient Name: Date of Service: Caleb Alken RD C. 07/10/2021 11:30 A M Medical Record Number: [id number] Patient Account Number: [id number] Date of Birth/Sex: Treating RN: [name][birth year] (75 y.o. [name] Primary Care Kartier Bennison: Yaakov Guthrie Other Clinician: Referring Latarra Eagleton: Treating Lataria Courser/Extender: Leigh Aurora in Treatment: 4 Encounter Discharge Information Items Discharge Condition: Stable Ambulatory Status: Ambulatory Discharge Destination: Home Transportation: Private Auto Accompanied By: wife Schedule Follow-up Appointment: Yes Clinical Summary of Care: Electronic Signature(s) Signed: 07/10/2021 2:18:48 PM By: Deon Pilling Entered By: Deon Pilling on 07/10/2021 13:12:06 -------------------------------------------------------------------------------- Lower Extremity Assessment Details Patient Name: Date of Service: [name], Caleb RD C. 07/10/2021 11:30 A M Medical Record Number: [id number] Patient Account Number: [id number] Date of Birth/Sex: Treating RN: [birth year][name] (75 y.o. [name] Primary Care Jesselle Laflamme: Yaakov Guthrie Other Clinician: Referring Kelby Lotspeich: Treating Demarus Latterell/Extender: [name] Weeks in Treatment: 4 Edema Assessment Assessed: Shirlyn Goltz: No] [Right: Yes] Edema: [Left: N] [Right: o] Calf Left: Right: Point of Measurement: 34 cm From Medial Instep 43 cm Ankle Left: Right: Point of Measurement: 13 cm From Medial Instep 25 cm Vascular Assessment Pulses: Dorsalis Pedis Palpable: [Right:Yes] Electronic Signature(s) Signed: 07/10/2021 2:18:48 PM By: Deon Pilling Entered By: Deon Pilling on 07/10/2021 12:33:07 -------------------------------------------------------------------------------- Multi Wound Chart Details Patient Name: Date of Service: Caleb Alken RD C. 07/10/2021 11:30 A M Medical Record Number: [id number] Patient Account Number: [id number] Date of Birth/Sex: Treating RN: [birth year] [name] (75 y.o. [name] Primary Care Mylei Brackeen: Yaakov Guthrie Other Clinician: Referring Willard Madrigal: Treating Jalie Eiland/Extender: Leigh Aurora in Treatment: 4 Vital Signs Height(in): 17 Pulse(bpm): 50 Weight(lbs): 210 Blood Pressure(mmHg): 155/82 Body Mass Index(BMI): 31 Temperature(F):  98.3 Respiratory Rate(breaths/min): 20 Photos: [N/A:N/A] Right, Lateral Foot N/A N/A Wound Location: Gradually Appeared N/A N/A Wounding Event: Neuropathic Ulcer-Non Diabetic N/A N/A Primary Etiology: Glaucoma, Chronic Obstructive N/A N/A Comorbid History: Pulmonary Disease (COPD), Sleep Apnea, Hypertension, Neuropathy, Received Chemotherapy, Received Radiation 03/30/2021 N/A N/A Date Acquired: 4 N/A N/A Weeks of Treatment: Open N/A N/A Wound Status: 1.1x2.2x0.1 N/A N/A Measurements L x W x D (cm) 1.901 N/A N/A A (cm) : rea 0.19 N/A N/A Volume (cm) : 65.40% N/A N/A % Reduction in Area: 65.50% N/A N/A % Reduction in Volume: Full Thickness  Without Exposed N/A N/A Classification: Support Structures Medium N/A N/A Exudate Amount: Serosanguineous N/A N/A Exudate Type: red, brown N/A N/A Exudate Color: Flat and Intact N/A N/A Wound Margin: Large (67-100%) N/A N/A Granulation Amount: Pink, Friable N/A N/A Granulation Quality: None Present (0%) N/A N/A Necrotic Amount: Fat Layer (Subcutaneous Tissue): Yes N/A N/A Exposed Structures: Fascia: No Tendon: No Muscle: No Joint: No Bone: No Small (1-33%) N/A N/A Epithelialization: T Contact Cast otal N/A N/A Procedures Performed: Treatment Notes Wound #4 (Foot) Wound Laterality: Right, Lateral Cleanser Soap and Water Discharge Instruction: May shower and wash wound with dial antibacterial soap and water prior to dressing change. Wound Cleanser Discharge Instruction: Cleanse the wound with wound cleanser prior to applying a clean dressing using gauze sponges, not tissue or cotton balls. Peri-Wound Care Topical Primary Dressing Hydrofera Blue Classic Foam, 2x2 in Discharge Instruction: Moisten with saline prior to applying to wound bed. Apply over Santyl. Secondary Dressing Woven Gauze Sponge, Non-Sterile 4x4 in Discharge Instruction: Apply over primary dressing as directed. Drawtex 4x4 in Discharge  Instruction: Apply over primary dressing as directed. Secured With 9M [employer] T ape, 2x2 (in/yd) Discharge Instruction: Secure dressing with tape as directed. Compression Wrap Compression Stockings Add-Ons Notes Last layer applied by MD. Electronic Signature(s) Signed: 07/10/2021 3:17:49 PM By: Kalman Shan DO Signed: 07/13/2021 4:44:37 PM By: Baruch Gouty RN, BSN Entered By: Kalman Shan on 07/10/2021 15:13:50 -------------------------------------------------------------------------------- Multi-Disciplinary Care Plan Details Patient Name: Date of Service: Caleb Alken RD C. 07/10/2021 11:30 A M Medical Record Number: [id number] Patient Account Number: [id number] Date of Birth/Sex: Treating RN: [name], [name] (75 y.o. [name] Primary Care Knoah Nedeau: Yaakov Guthrie Other Clinician: Referring Adrien Shankar: Treating Wise Fees/Extender: Leigh Aurora in Treatment: 4 Active Inactive Wound/Skin Impairment Nursing Diagnoses: Impaired tissue integrity Goals: Patient/caregiver will verbalize understanding of skin care regimen Date Initiated: 06/11/2021 Target Resolution Date: 07/17/2021 Goal Status: Active Ulcer/skin breakdown will have a volume reduction of 30% by week 4 Date Initiated: 06/11/2021 Target Resolution Date: 07/17/2021 Goal Status: Active Interventions: Assess patient/caregiver ability to obtain necessary supplies Assess patient/caregiver ability to perform ulcer/skin care regimen upon admission and as needed Assess ulceration(s) every visit Notes: Electronic Signature(s) Signed: 07/10/2021 1:37:28 PM By: Baruch Gouty RN, BSN Entered By: Baruch Gouty on 07/10/2021 12:49:56 -------------------------------------------------------------------------------- Pain Assessment Details Patient Name: Date of Service: Caleb Alken RD C. 07/10/2021 11:30 A M Medical Record Number: [id number] Patient Account Number:  [id number] Date of Birth/Sex: Treating RN: [name][birth year] (75 y.o. [name] Primary Care Karanveer Ramakrishnan: Yaakov Guthrie Other Clinician: Referring Lakina Mcintire: Treating Simcha Speir/Extender: Leigh Aurora in Treatment: 4 Active Problems Location of Pain Severity and Description of Pain Patient Has Paino No Site Locations Rate the pain. Rate the pain. Current Pain Level: 0 Pain Management and Medication Current Pain Management: Medication: No Cold Application: No Rest: No Massage: No Activity: No T.E.N.S.: No Heat Application: No Leg drop or elevation: No Is the Current Pain Management Adequate: Adequate How does your wound impact your activities of daily livingo Sleep: No Bathing: No Appetite: No Relationship With Others: No Bladder Continence: No Emotions: No Bowel Continence: No Work: No Toileting: No Drive: No Dressing: No Hobbies: No Electronic Signature(s) Signed: 07/10/2021 2:18:48 PM By: Deon Pilling Entered By: Deon Pilling on 07/10/2021 12:32:41 -------------------------------------------------------------------------------- Patient/Caregiver Education Details Patient Name: Date of Service: Caleb Alken RD [name] 7/22/2022andnbsp11:30 A M Medical Record Number: [id number] Patient Account Number: [id number] Date of Birth/Gender: Treating RN: [birth year] (75 y.o. [name],  [name] Primary Care Physician: Yaakov Guthrie Other Clinician: Referring Physician: Treating Physician/Extender: Leigh Aurora in Treatment: 4 Education Assessment Education Provided To: Patient Education Topics Provided Offloading: Methods: Explain/Verbal Responses: Reinforcements needed, State content correctly Wound/Skin Impairment: Methods: Explain/Verbal Responses: Reinforcements needed, State content correctly Electronic Signature(s) Signed: 07/10/2021 1:37:28 PM By: Baruch Gouty RN, BSN Signed: 07/10/2021 1:37:28 PM By: Baruch Gouty  RN, BSN Entered By: Baruch Gouty on 07/10/2021 12:54:38 -------------------------------------------------------------------------------- Wound Assessment Details Patient Name: Date of Service: Caleb Alken RD C. 07/10/2021 11:30 A M Medical Record Number: [id number] Patient Account Number: [id number] Date of Birth/Sex: Treating RN: [birth year] [name] (75 y.o. [name], [name] Primary Care [name] Caleb: Yaakov Guthrie Other Clinician: Referring Briele Lagasse: Treating Asheley Hellberg/Extender: [name] Weeks in Treatment: 4 Wound Status Wound Number: 4 Primary Neuropathic Ulcer-Non Diabetic Etiology: Wound Location: Right, Lateral Foot Wound Open Wounding Event: Gradually Appeared Status: Date Acquired: 03/30/2021 Comorbid Glaucoma, Chronic Obstructive Pulmonary Disease (COPD), Sleep Weeks Of Treatment: 4 History: Apnea, Hypertension, Neuropathy, Received Chemotherapy, Clustered Wound: No Received Radiation Photos Wound Measurements Length: (cm) 1.1 Width: (cm) 2.2 Depth: (cm) 0.1 Area: (cm) 1.901 Volume: (cm) 0.19 % Reduction in Area: 65.4% % Reduction in Volume: 65.5% Epithelialization: Small (1-33%) Tunneling: No Undermining: No Wound Description Classification: Full Thickness Without Exposed Support Structures Wound Margin: Flat and Intact Exudate Amount: Medium Exudate Type: Serosanguineous Exudate Color: red, brown Foul Odor After Cleansing: No Slough/Fibrino No Wound Bed Granulation Amount: Large (67-100%) Exposed Structure Granulation Quality: Pink, Friable Fascia Exposed: No Necrotic Amount: None Present (0%) Fat Layer (Subcutaneous Tissue) Exposed: Yes Tendon Exposed: No Muscle Exposed: No Joint Exposed: No Bone Exposed: No Treatment Notes Wound #4 (Foot) Wound Laterality: Right, Lateral Cleanser Soap and Water Discharge Instruction: May shower and wash wound with dial antibacterial soap and water prior to dressing change. Wound  Cleanser Discharge Instruction: Cleanse the wound with wound cleanser prior to applying a clean dressing using gauze sponges, not tissue or cotton balls. Peri-Wound Care Topical Primary Dressing Hydrofera Blue Classic Foam, 2x2 in Discharge Instruction: Moisten with saline prior to applying to wound bed. Apply over Santyl. Secondary Dressing Woven Gauze Sponge, Non-Sterile 4x4 in Discharge Instruction: Apply over primary dressing as directed. Drawtex 4x4 in Discharge Instruction: Apply over primary dressing as directed. Secured With 48M [place] [facility] T ape, 2x2 (in/yd) Discharge Instruction: Secure dressing with tape as directed. Compression Wrap Compression Stockings Add-Ons Notes Last layer applied by MD. Electronic Signature(s) Signed: 07/10/2021 2:18:48 PM By: Deon Pilling Signed: 07/13/2021 7:57:27 AM By: Sandre Kitty Entered By: Sandre Kitty on 07/10/2021 12:35:06 -------------------------------------------------------------------------------- Vitals Details Patient Name: Date of Service: Caleb Alken RD C. 07/10/2021 11:30 A M Medical Record Number: [id number] Patient Account Number: [id number] Date of Birth/Sex: Treating RN: [birth year]-[name] (75 y.o. [name], [name] Primary Care Jshon Ibe: Yaakov Guthrie Other Clinician: Referring Jule Schlabach: Treating Bristal Steffy/Extender: Leigh Aurora in Treatment: 4 Vital Signs Time Taken: 12:15 Temperature (F): 98.3 Height (in): 69 Pulse (bpm): 79 Weight (lbs): 210 Respiratory Rate (breaths/min): 20 Body Mass Index (BMI): 31 Blood Pressure (mmHg): 155/82 Reference Range: 80 - 120 mg / dl Electronic Signature(s) Signed: 07/10/2021 2:18:48 PM By: Deon Pilling Entered By: Deon Pilling on 07/10/2021 12:32:30

## 2021-07-15 ENCOUNTER — Encounter (HOSPITAL_BASED_OUTPATIENT_CLINIC_OR_DEPARTMENT_OTHER): Payer: Medicare Other | Admitting: Internal Medicine

## 2021-07-15 ENCOUNTER — Other Ambulatory Visit: Payer: Self-pay

## 2021-07-15 DIAGNOSIS — L97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 with unspecified severity: Secondary | ICD-10-CM | POA: Diagnosis not present

## 2021-07-15 NOTE — Progress Notes (Signed)
KARDELL, VIRGIL ([id number]) Visit Report for 07/15/2021 HPI Details Patient Name: Date of Service: MAKAEL, STEIN RD C. 07/15/2021 3:00 PM Medical Record Number: [id number] Patient Account Number: [id number] Date of Birth/Sex: Treating RN: [birth year] (75 y.o. [name] Primary Care Provider: Yaakov Guthrie Other Clinician: Referring Provider: Treating Provider/Extender: [name] in Treatment: 4 History of Present Illness HPI Description: Admission 6/23 Mr. 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 He states this started a little over 2 months ago. This is now the third time he has developed a wound to this area. He followed with podiatry for this issue and has been using Betadine daily to it. He recently switched to silver alginate a week ago. He has been on doxycycline since May For this issue and just recently started a course of Keflex prescribed in the ED on 6/18 For possible cellulitis. Currently denies signs of infection. He uses a front offloading shoe to help with relieving the pressure associated with the wound formation. He reports minimal drainage. He does not have sensation to his feet and denies any pain. Currently denies signs of infection. 6/29; this is a patient with chemotherapy-induced neuropathy. We had in clinic last year he was readmitted to our clinic last week. He has an area on the right fifth metatarsal head. Dr. Heber McKinney admitted to the to the clinic last week started him on Santyl they are using this daily. The wound started in March she went to podiatry. They are using Betadine to this I am not really sure about the benefit of Betadine in this type of situation. There is not appear to be any active infection. He is using a forefoot offloading boot 7/6; wound on the right fifth metatarsal  head. Using Santyl daily. He is using a forefoot offloading boot but claims to be staying off this religiously Again talk to me about a vacation they had planned for mid August. They have a cancellation insurance and want to go ahead and put this in. I asked him to see if there is a specific form they want filled out. It does seem unlikely that he will be healed by that time [August 12]. Furthermore there are apparently no elevators they be going up stairs. 7/13; right fifth met head. Still not a viable surface using Hydrofera Blue and Santyl. Offloading in a forefoot offloading 7/20; right fifth metatarsal head. Surface looks better. Dimensions down by 4 x 3 mm. We will moved to straight [facility] with a total contact cast today His wife asked me to look at some erythema on the left anterior lower leg. It is indeed warm but not tender [however he is neuropathic]. He also has some degree of venous insufficiency I wonder some of this could be stasis dermatitis 7/22; patient presents for obligatory cast change. He reports not issues with the cast placed earlier this week. He tolerated this well. He has no complaints or questions today. 7/29; the patient has no complaints. The wound actually looks quite good rim of epithelialization nice healthy granulation. But only minimal improvement in size Electronic Signature(s) Signed: 07/15/2021 4:50:28 PM By: Linton Ham MD Entered By: Linton Ham on 07/15/2021 16:30:40 -------------------------------------------------------------------------------- Chemical Cauterization Details Patient Name: Date of Service: [name] 07/15/2021 3:00 PM Medical Record Number: [id number] Patient Account Number: [id number] Date of Birth/Sex: Treating  RN: March 26, 1946 (75 y.o. Marcheta Grammes Primary Care Provider: Yaakov Guthrie Other Clinician: Referring Provider: Treating Provider/Extender: [name] in Treatment:  4 Procedure Performed for: Wound #4 Right,Lateral Foot Performed By: Physician Ricard Dillon., MD Post Procedure Diagnosis Same as Pre-procedure Electronic Signature(s) Signed: 07/15/2021 4:50:28 PM By: Linton Ham MD Signed: 07/15/2021 5:58:16 PM By: Lorrin Jackson Entered By: Lorrin Jackson on 07/15/2021 16:21:21 -------------------------------------------------------------------------------- Physical Exam Details Patient Name: Date of Service: [name] 07/15/2021 3:00 PM Medical Record Number: [id number] Patient Account Number: [id number] Date of Birth/Sex: Treating RN: 05/08/1946 (75 y.o. [name] Primary Care Provider: Yaakov Guthrie Other Clinician: Referring Provider: Treating Provider/Extender: [name] in Treatment: 4 Constitutional Patient is hypertensive.. Pulse regular and within target range for patient.Marland Kitchen Respirations regular, non-labored and within target range.. Temperature is normal and within the target range for the patient.Marland Kitchen Appears in no distress. Notes Wound exam; right foot lateral aspect. Nice healthy granulation. No callus no drainage. Rim of epithelialization but not advancing. Under illumination I am wondering whether he has hypergranulation. I used silver nitrate to try and reduce this. There was no evidence of infection Electronic Signature(s) Signed: 07/15/2021 4:50:28 PM By: Linton Ham MD Entered By: Linton Ham on 07/15/2021 16:31:36 -------------------------------------------------------------------------------- Physician Orders Details Patient Name: Date of Service: [name] 07/15/2021 3:00 PM Medical Record Number: [id number] Patient Account Number: [id number] Date of Birth/Sex: Treating RN: 06/24/1946 (75 y.o. 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 Bathing/ Shower/ Hygiene May shower with protection but do not get wound dressing(s) wet. Edema Control - Lymphedema / SCD / Other Elevate legs to the level of the heart or above for 30 minutes daily and/or when sitting, a frequency of: - throughout the day Avoid standing for long periods of time. Off-Loading Total Contact Cast to Right Lower Extremity Wound Treatment Wound #4 - Foot Wound Laterality: Right, Lateral Cleanser: Soap and Water 1 x Per Week/7 Days Discharge Instructions: May shower and wash wound with dial antibacterial soap and water prior to dressing change. Cleanser: Wound Cleanser (Generic) 1 x Per Week/7 Days Discharge Instructions: Cleanse the wound with wound cleanser prior to applying a clean dressing using gauze sponges, not tissue or cotton balls. Prim Dressing: Hydrofera Blue Classic Foam, 2x2 in (Generic) 1 x Per Week/7 Days ary Discharge Instructions: Moisten with saline prior to applying to wound bed. Apply over Santyl. Secondary Dressing: Woven Gauze Sponge, Non-Sterile 4x4 in (Generic) 1 x Per Week/7 Days Discharge Instructions: Apply over primary dressing as directed. Secondary Dressing: Drawtex 4x4 in 1 x Per Week/7 Days Discharge Instructions: Apply over primary dressing as directed. Secured With: 24M Medipore H Soft Cloth Surgical Tape, 2x2 (in/yd) (Generic) 1 x Per Week/7 Days Discharge Instructions: Secure dressing with tape as directed. Electronic Signature(s) Signed: 07/15/2021 4:50:28 PM By: Linton Ham MD Signed:  07/15/2021 5:58:16 PM By: Lorrin Jackson Entered By: Lorrin Jackson on 07/15/2021 16:13:01 -------------------------------------------------------------------------------- Problem List Details Patient Name: Date of Service: [name] 07/15/2021 3:00 PM Medical Record Number: [id number]  Patient Account Number: [id number] Date of Birth/Sex: Treating RN: May 28, 1946 (75 y.o. 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 06/11/2021 No Yes J44.9 Chronic obstructive pulmonary disease, unspecified 06/11/2021 No Yes G47.33 Obstructive sleep apnea (adult) (pediatric) 06/11/2021 No Yes Z86.718 Personal history of other venous thrombosis and embolism 06/11/2021 No Yes L03.116 Cellulitis of left lower limb 07/08/2021 No Yes Inactive Problems Resolved Problems Electronic Signature(s) Signed: 07/15/2021 4:50:28 PM By: Linton Ham MD Entered By: Linton Ham on 07/15/2021 16:28:45 -------------------------------------------------------------------------------- Progress Note Details Patient Name: Date of Service: [name] 07/15/2021 3:00 PM Medical Record Number: [id number] Patient Account Number: [id number] Date of Birth/Sex: Treating RN: [birth year] (75 y.o. [name] Primary Care Provider: Yaakov Guthrie Other Clinician: Referring Provider: Treating Provider/Extender: [name] in Treatment: 4 Subjective History of Present Illness (HPI) Admission 6/23 Mr. 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 He states  this started a little over 2 months ago. This is now the third time he has developed a wound to this area. He followed with podiatry for this issue and has been using Betadine daily to it. He recently switched to silver alginate a week ago. He has been on doxycycline since May For this issue and just recently started a course of Keflex prescribed in the ED on 6/18 For possible cellulitis. Currently denies signs of infection. He uses a front offloading shoe to help with relieving the pressure associated with the wound formation. He reports minimal drainage. He does not have sensation to his feet and denies any pain. Currently denies signs of infection. 6/29; this is a patient with chemotherapy-induced neuropathy. We had in clinic last year he was readmitted to our clinic last week. He has an area on the right fifth metatarsal head. Dr. Heber [place] admitted to the to the clinic last week started him on Santyl they are using this daily. The wound started in March she went to podiatry. They are using Betadine to this I am not really sure about the benefit of Betadine in this type of situation. There is not appear to be any active infection. He is using a forefoot offloading boot 7/6; wound on the right fifth metatarsal head. Using Santyl daily. He is using a forefoot offloading boot but claims to be staying off this religiously Again talk to me about a vacation they had planned for mid August. They have a cancellation insurance and want to go ahead and put this in. I asked him to see if there is a specific form they want filled out. It does seem unlikely that he will be healed by that time [August 12]. Furthermore there are apparently no elevators they be going up stairs. 7/13; right fifth met head. Still not a viable surface using Hydrofera Blue and Santyl. Offloading in a forefoot offloading 7/20; right fifth metatarsal head. Surface looks better. Dimensions down by 4 x 3 mm. We will moved to straight  [facility] with a total contact cast today His wife asked me to look at some erythema on the left anterior lower leg. It is indeed warm but not tender [however he is neuropathic].  He also has some degree of venous insufficiency I wonder some of this could be stasis dermatitis 7/22; patient presents for obligatory cast change. He reports not issues with the cast placed earlier this week. He tolerated this well. He has no complaints or questions today. 7/29; the patient has no complaints. The wound actually looks quite good rim of epithelialization nice healthy granulation. But only minimal improvement in size Objective Constitutional Patient is hypertensive.. Pulse regular and within target range for patient.Marland Kitchen Respirations regular, non-labored and within target range.. Temperature is normal and within the target range for the patient.Marland Kitchen Appears in no distress. Vitals Time Taken: 4:01 PM, Height: 69 in, Weight: 210 lbs, BMI: 31, Temperature: 98.4 F, Pulse: 87 bpm, Respiratory Rate: 20 breaths/min, Blood Pressure: 151/73 mmHg. General Notes: Wound exam; right foot lateral aspect. Nice healthy granulation. No callus no drainage. Rim of epithelialization but not advancing. Under illumination I am wondering whether he has hypergranulation. I used silver nitrate to try and reduce this. There was no evidence of infection Integumentary (Hair, Skin) Wound #4 status is Open. Original cause of wound was Gradually Appeared. The date acquired was: 03/30/2021. The wound has been in treatment 4 weeks. The wound is located on the Right,Lateral Foot. The wound measures 1.5cm length x 2cm width x 0.1cm depth; 2.356cm^2 area and 0.236cm^3 volume. There is Fat Layer (Subcutaneous Tissue) exposed. There is no undermining noted. There is a medium amount of serosanguineous drainage noted. The wound margin is distinct with the outline attached to the wound base. There is large (67-100%) pink, friable granulation  within the wound bed. There is no necrotic tissue within the wound bed. 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 There was a T Contact Cast Procedure by Deno Etienne., MD. Post procedure Diagnosis Wound #4: Same as Pre-Procedure Pre-procedure diagnosis of Wound #4 is a Neuropathic Ulcer-Non Diabetic located on the Right,Lateral Foot . An Chemical Cauterization procedure was performed by Ricard Dillon., MD. Post procedure Diagnosis Wound #4: Same as Pre-Procedure Plan Follow-up Appointments: Return Appointment in 1 week. Bathing/ Shower/ Hygiene: May shower with protection but do not get wound dressing(s) wet. Edema Control - Lymphedema / SCD / Other: Elevate legs to the level of the heart or above for 30 minutes daily and/or when sitting, a frequency of: - throughout the day Avoid standing for long periods of time. Off-Loading: T Contact Cast to Right Lower Extremity otal WOUND #4: - Foot Wound Laterality: Right, Lateral Cleanser: Soap and Water 1 x Per Week/7 Days Discharge Instructions: May shower and wash wound with dial antibacterial soap and water prior to dressing change. Cleanser: Wound Cleanser (Generic) 1 x Per Week/7 Days Discharge Instructions: Cleanse the wound with wound cleanser prior to applying a clean dressing using gauze sponges, not tissue or cotton balls. Prim Dressing: Hydrofera Blue Classic Foam, 2x2 in (Generic) 1 x Per Week/7 Days ary Discharge Instructions: Moisten with saline prior to applying to wound bed. Apply over  Santyl. Secondary Dressing: Woven Gauze Sponge, Non-Sterile 4x4 in (Generic) 1 x Per Week/7 Days Discharge Instructions: Apply over primary dressing as directed. Secondary Dressing: Drawtex 4x4 in 1 x Per Week/7 Days Discharge  Instructions: Apply over primary dressing as directed. Secured With: 71M Medipore H Soft Cloth Surgical T ape, 2x2 (in/yd) (Generic) 1 x Per Week/7 Days Discharge Instructions: Secure dressing with tape as directed. 1. Silver nitrate today I am hoping that may be the difference. Continued with Hydrofera Blue 2. Everything looks very healthy here but not advancing. No evidence of infection 3. Still in a total contact cast Electronic Signature(s) Signed: 07/15/2021 4:50:28 PM By: Linton Ham MD Entered By: Linton Ham on 07/15/2021 16:32:23 -------------------------------------------------------------------------------- Total Contact Cast Details Patient Name: Date of Service: [name] 07/15/2021 3:00 PM Medical Record Number: [id number] Patient Account Number: [id number] Date of Birth/Sex: Treating RN: 08/21/1946 (75 y.o. [name] Primary Care Provider: Other Clinician: Yaakov Guthrie Referring Provider: Treating Provider/Extender: [name] in Treatment: 4 T Contact Cast Applied for Wound Assessment: otal Wound #4 Right,Lateral Foot Performed By: Physician Ricard Dillon., MD Post Procedure Diagnosis Same as Pre-procedure Electronic Signature(s) Signed: 07/15/2021 4:50:28 PM By: Linton Ham MD Entered By: Linton Ham on 07/15/2021 16:29:09 -------------------------------------------------------------------------------- SuperBill Details Patient Name: Date of Service: [name] 07/15/2021 Medical Record Number: [id number] Patient Account Number: [id number] Date of Birth/Sex: Treating RN: [birth year] (75 y.o. 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 J44.9 Chronic obstructive pulmonary disease, unspecified G47.33 Obstructive sleep apnea (adult) (pediatric) Z86.718 Personal history of other venous thrombosis and embolism L03.116 Cellulitis of left lower limb Facility Procedures CPT4 Code: [id number] Description: [phone] - APPLY TOTAL CONTACT LEG CAST ICD-10 Diagnosis Description 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

## 2021-07-20 NOTE — Progress Notes (Signed)
[name], [name] ([id number]) Visit Report for 07/15/2021 Arrival Information Details Patient Name: Date of Service: [name], Caleb RD C. 07/15/2021 3:00 PM Medical Record Number: [id number] Patient Account Number: [id number] Date of Birth/Sex: Treating RN: 07-19-1946 (75 y.o. [name] Primary Care Kayliee Atienza: Yaakov Guthrie Other Clinician: Referring Jaryn Rosko: Treating Denym Rahimi/Extender: [name] in Treatment: 4 Visit Information History Since Last Visit Added or deleted any medications: No Patient Arrived: Ambulatory Any new allergies or adverse reactions: No Arrival Time: 16:00 Had a fall or experienced change in No Accompanied By: wife activities of daily living that may affect Transfer Assistance: None risk of falls: Patient Identification Verified: Yes Signs or symptoms of abuse/neglect since last visito No Secondary Verification Process Completed: Yes Hospitalized since last visit: No Patient Requires Transmission-Based Precautions: No Implantable device outside of the clinic excluding No Patient Has Alerts: Yes cellular tissue based products placed in the center Patient Alerts: Patient on Blood Thinner since last visit: R ABI: 1.01 TBI: 0.77 Has Dressing in Place as Prescribed: Yes L ABI: 1.22 TBI: 0.87 Pain Present Now: No 05/01/21 Electronic Signature(s) Signed: 07/20/2021 1:33:39 PM By: Sandre Kitty Entered By: Sandre Kitty on 07/15/2021 16:01:00 -------------------------------------------------------------------------------- Encounter Discharge Information Details Patient Name: Date of Service: Caleb Alken RD C. 07/15/2021 3:00 PM Medical Record Number: [id number] Patient Account Number: [id number] Date of Birth/Sex: Treating RN: June 19, 1946 (75 y.o. Marcheta Grammes Primary Care Demaurion Dicioccio: Yaakov Guthrie Other Clinician: Referring Nakita Santerre: Treating Jontez Redfield/Extender: [name] in Treatment:  4 Encounter Discharge Information Items Discharge Condition: Stable Ambulatory Status: Ambulatory Discharge Destination: Home Transportation: Private Auto Accompanied By: wife Schedule Follow-up Appointment: Yes Clinical Summary of Care: Provided on 07/15/2021 Form Type Recipient Paper Patient Patient Electronic Signature(s) Signed: 07/15/2021 5:58:16 PM By: Lorrin Jackson Entered By: Lorrin Jackson on 07/15/2021 16:23:50 -------------------------------------------------------------------------------- Lower Extremity Assessment Details Patient Name: Date of Service: Caleb Alken RD C. 07/15/2021 3:00 PM Medical Record Number: [id number] Patient Account Number: [id number] Date of Birth/Sex: Treating RN: 1946/06/26 (75 y.o. Marcheta Grammes Primary Care Ritika Hellickson: Yaakov Guthrie Other Clinician: Referring Anthonny Schiller: Treating Ezekial Arns/Extender: [name] in Treatment: 4 Edema Assessment Assessed: Shirlyn Goltz: No] Patrice Paradise: Yes] Edema: [Left: N] [Right: o] Calf Left: Right: Point of Measurement: 34 cm From Medial Instep 42 cm Ankle Left: Right: Point of Measurement: 13 cm From Medial Instep 25 cm Vascular Assessment Pulses: Dorsalis Pedis Palpable: [Right:Yes] Electronic Signature(s) Signed: 07/15/2021 5:58:16 PM By: Lorrin Jackson Entered By: Lorrin Jackson on 07/15/2021 16:08:43 -------------------------------------------------------------------------------- Multi Wound Chart Details Patient Name: Date of Service: Caleb Alken RD C. 07/15/2021 3:00 PM Medical Record Number: [id number] Patient Account Number: [id number] Date of Birth/Sex: Treating RN: 04-19-1946 (75 y.o. 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] [N/A:N/A] Primary Etiology: [4:Glaucoma, Chronic Obstructive] [N/A:N/A] Comorbid History: [4:Pulmonary Disease (COPD), Sleep Apnea, Hypertension, Neuropathy, Received Chemotherapy, Received Radiation 03/30/2021] [N/A:N/A] Date Acquired: [4:4] [N/A:N/A] Weeks of Treatment: [4:Open] [N/A:N/A] Wound Status: [4:1.5x2x0.1] [N/A:N/A] Measurements L x W x D (cm) [4:2.356] [N/A:N/A] A (cm) : rea [4:0.236] [N/A:N/A] Volume (cm) : [4:57.10%] [N/A:N/A] % Reduction in Area: [4:57.10%] [N/A:N/A] % Reduction in Volume: [4:Full Thickness Without Exposed] [N/A:N/A] Classification: [4:Support Structures Medium] [N/A:N/A] Exudate Amount: [4:Serosanguineous] [N/A:N/A]  Exudate Type: [4:red, brown] [N/A:N/A] Exudate Color: [4:Distinct, outline attached] [N/A:N/A] Wound Margin: [4:Large (67-100%)] [N/A:N/A] Granulation Amount: [4:Pink, Friable] [N/A:N/A] Granulation Quality: [4:None Present (0%)] [N/A:N/A] Necrotic Amount: [4:Fat Layer (Subcutaneous Tissue): Yes N/A] Exposed Structures: [4:Fascia: No Tendon: No Muscle: No Joint: No Bone: No Medium (34-66%)] [N/A:N/A] Epithelialization: [4:Calloused Periwound] [N/A:N/A] Assessment Notes: [4:Chemical Cauterization] [N/A:N/A] Procedures Performed: [4:T Contact Cast otal] Treatment Notes Wound #4 (Foot) Wound Laterality: Right, Lateral Cleanser Soap and Water Discharge Instruction: May shower and wash wound with dial antibacterial soap and water prior to dressing change. Wound Cleanser Discharge Instruction: Cleanse the wound with wound cleanser prior to applying a clean dressing using gauze sponges, not tissue or cotton balls. Peri-Wound Care Topical Primary Dressing Hydrofera Blue Classic Foam, 2x2 in Discharge Instruction: Moisten with saline  prior to applying to wound bed. Apply over Santyl. Secondary Dressing Woven Gauze Sponge, Non-Sterile 4x4 in Discharge Instruction: Apply over primary dressing as directed. Drawtex 4x4 in Discharge Instruction: Apply over primary dressing as directed. Secured With 42M [place] Surgical T ape, 2x2 (in/yd) Discharge Instruction: Secure dressing with tape as directed. Compression Wrap Compression Stockings Add-Ons Electronic Signature(s) Signed: 07/15/2021 4:50:28 PM By: Linton Ham MD Signed: 07/15/2021 5:49:27 PM By: Levan Hurst RN, BSN Entered By: Linton Ham on 07/15/2021 16:28:52 -------------------------------------------------------------------------------- Multi-Disciplinary Care Plan Details Patient Name: Date of Service: Caleb Alken RD C. 07/15/2021 3:00 PM Medical Record Number: [id number] Patient Account Number: [id number] Date of Birth/Sex: Treating RN: Sep 08, 1946 (75 y.o. Marcheta Grammes Primary Care Jakarie Pember: Yaakov Guthrie Other Clinician: Referring Hollye Pritt: Treating Carley Strickling/Extender: [name] in Treatment: 4 Active Inactive Wound/Skin Impairment Nursing Diagnoses: Impaired tissue integrity Goals: Patient/caregiver will verbalize understanding of skin care regimen Date Initiated: 06/11/2021 Date Inactivated: 07/15/2021 Target Resolution Date: 07/17/2021 Goal Status: Met Ulcer/skin breakdown will have a volume reduction of 30% by week 4 Date Initiated: 06/11/2021 Date Inactivated: 07/15/2021 Target Resolution Date: 07/17/2021 Goal Status: Met Ulcer/skin breakdown will have a volume reduction of 50% by week 8 Date Initiated: 07/15/2021 Target Resolution Date: 08/12/2021 Goal Status: Active Interventions: Assess patient/caregiver ability to obtain necessary supplies Assess patient/caregiver ability to perform ulcer/skin care regimen upon admission and as needed Assess ulceration(s) every visit Notes: Electronic  Signature(s) Signed: 07/15/2021 5:58:16 PM By: Lorrin Jackson Entered By: Lorrin Jackson on 07/15/2021 16:10:50 -------------------------------------------------------------------------------- Pain Assessment Details Patient Name: Date of Service: Caleb Alken RD C. 07/15/2021 3:00 PM Medical Record Number: [id number] Patient Account Number: [id number] Date of Birth/Sex: Treating RN: [birth year] (75 y.o. [name] Primary Care Ganesh Deeg: Yaakov Guthrie Other Clinician: Referring Alec Jaros: Treating Alivya Wegman/Extender: [name] in Treatment: 4 Active Problems Location of Pain Severity and Description of Pain Patient Has Paino No Site Locations Pain Management and Medication Current Pain Management: Electronic Signature(s) Signed: 07/15/2021 5:49:27 PM By: Levan Hurst RN, BSN Signed: 07/20/2021 1:33:39 PM By: Sandre Kitty Entered By: Sandre Kitty on 07/15/2021 16:01:34 -------------------------------------------------------------------------------- Patient/Caregiver Education Details Patient Name: Date of Service: Caleb Alken RD [name] 7/27/2022andnbsp3:00 PM Medical Record Number: [id number] Patient Account Number: [id number] Date of Birth/Gender: Treating RN: 1946-02-21 (75 y.o. Marcheta Grammes Primary Care Physician: Yaakov Guthrie Other Clinician: Referring Physician: Treating Physician/Extender: [name] in Treatment: 4 Education Assessment Education Provided To: Patient Education Topics Provided Offloading: Methods: Explain/Verbal, Printed Responses: State content correctly Wound/Skin Impairment: Methods: Explain/Verbal, Printed Responses: State content correctly Electronic Signature(s) Signed: 07/15/2021 5:58:16 PM By: Lorrin Jackson Entered By: Lorrin Jackson on 07/15/2021 16:11:16 -------------------------------------------------------------------------------- Wound Assessment Details  Patient  Name: Date of Service: [name], [name] RD C. 07/15/2021 3:00 PM Medical Record Number: [id number] Patient Account Number: [id number] Date of Birth/Sex: Treating RN: [birth year] (75 y.o. Marcheta Grammes Primary Care Julliana Whitmyer: Yaakov Guthrie Other Clinician: Referring Daud Cayer: Treating Annisha Baar/Extender: [name] in Treatment: 4 Wound Status Wound Number: 4 Primary Neuropathic Ulcer-Non Diabetic Etiology: Wound Location: Right, Lateral Foot Wound Open Wounding Event: Gradually Appeared Status: Date Acquired: 03/30/2021 Comorbid Glaucoma, Chronic Obstructive Pulmonary Disease (COPD), Sleep Weeks Of Treatment: 4 History: Apnea, Hypertension, Neuropathy, Received Chemotherapy, Clustered Wound: No Received Radiation Photos Wound Measurements Length: (cm) 1.5 Width: (cm) 2 Depth: (cm) 0.1 Area: (cm) 2.356 Volume: (cm) 0.236 % Reduction in Area: 57.1% % Reduction in Volume: 57.1% Epithelialization: Medium (34-66%) Undermining: No Wound Description Classification: Full Thickness Without Exposed Support Structures Wound Margin: Distinct, outline attached Exudate Amount: Medium Exudate Type: Serosanguineous Exudate Color: red, brown Foul Odor After Cleansing: No Slough/Fibrino No Wound Bed Granulation Amount: Large (67-100%) Exposed Structure Granulation Quality: Pink, Friable Fascia Exposed: No Necrotic Amount: None Present (0%) Fat Layer (Subcutaneous Tissue) Exposed: Yes Tendon Exposed: No Muscle Exposed: No Joint Exposed: No Bone Exposed: No Assessment Notes Calloused Periwound Treatment Notes Wound #4 (Foot) Wound Laterality: Right, Lateral Cleanser Soap and Water Discharge Instruction: May shower and wash wound with dial antibacterial soap and water prior to dressing change. Wound Cleanser Discharge Instruction: Cleanse the wound with wound cleanser prior to applying a clean dressing using gauze sponges, not tissue or cotton  balls. Peri-Wound Care Topical Primary Dressing Hydrofera Blue Classic Foam, 2x2 in Discharge Instruction: Moisten with saline prior to applying to wound bed. Apply over Santyl. Secondary Dressing Woven Gauze Sponge, Non-Sterile 4x4 in Discharge Instruction: Apply over primary dressing as directed. Drawtex 4x4 in Discharge Instruction: Apply over primary dressing as directed. Secured With 68M [place] Surgical T ape, 2x2 (in/yd) Discharge Instruction: Secure dressing with tape as directed. Compression Wrap Compression Stockings Add-Ons Electronic Signature(s) Signed: 07/15/2021 5:58:16 PM By: Lorrin Jackson Entered By: Lorrin Jackson on 07/15/2021 16:14:36 -------------------------------------------------------------------------------- Vitals Details Patient Name: Date of Service: Caleb Alken RD C. 07/15/2021 3:00 PM Medical Record Number: [id number] Patient Account Number: [id number] Date of Birth/Sex: Treating RN: 06/01/1946 (75 y.o. [name] Primary Care Kaitelyn Jamison: Yaakov Guthrie Other Clinician: Referring Wyona Neils: Treating Jovita Persing/Extender: [name] in Treatment: 4 Vital Signs Time Taken: 16:01 Temperature (F): 98.4 Height (in): 69 Pulse (bpm): 87 Weight (lbs): 210 Respiratory Rate (breaths/min): 20 Body Mass Index (BMI): 31 Blood Pressure (mmHg): 151/73 Reference Range: 80 - 120 mg / dl Electronic Signature(s) Signed: 07/20/2021 1:33:39 PM By: Sandre Kitty Entered By: Sandre Kitty on 07/15/2021 16:01:18

## 2021-07-23 ENCOUNTER — Encounter (HOSPITAL_BASED_OUTPATIENT_CLINIC_OR_DEPARTMENT_OTHER): Payer: Medicare Other | Attending: Internal Medicine | Admitting: Internal Medicine

## 2021-07-23 ENCOUNTER — Other Ambulatory Visit: Payer: Self-pay

## 2021-07-23 DIAGNOSIS — Z85819 Personal history of malignant neoplasm of unspecified site of lip, oral cavity, and pharynx: Secondary | ICD-10-CM | POA: Diagnosis not present

## 2021-07-23 DIAGNOSIS — L97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 with unspecified severity: Secondary | ICD-10-CM | POA: Diagnosis present

## 2021-07-23 DIAGNOSIS — Z9221 Personal history of antineoplastic chemotherapy: Secondary | ICD-10-CM | POA: Insufficient documentation

## 2021-07-23 DIAGNOSIS — Z87891 Personal history of nicotine dependence: Secondary | ICD-10-CM | POA: Diagnosis not present

## 2021-07-23 DIAGNOSIS — G4733 Obstructive sleep apnea (adult) (pediatric): Secondary | ICD-10-CM | POA: Diagnosis not present

## 2021-07-23 DIAGNOSIS — Z923 Personal history of irradiation: Secondary | ICD-10-CM | POA: Insufficient documentation

## 2021-07-23 DIAGNOSIS — Z86718 Personal history of other venous thrombosis and embolism: Secondary | ICD-10-CM | POA: Insufficient documentation

## 2021-07-23 DIAGNOSIS — Z85118 Personal history of other malignant neoplasm of bronchus and lung: Secondary | ICD-10-CM | POA: Diagnosis not present

## 2021-07-23 DIAGNOSIS — J449 Chronic obstructive pulmonary disease, unspecified: Secondary | ICD-10-CM | POA: Insufficient documentation

## 2021-07-23 DIAGNOSIS — L03116 Cellulitis of left lower limb: Secondary | ICD-10-CM | POA: Insufficient documentation

## 2021-07-23 DIAGNOSIS — Z8612 Personal history of poliomyelitis: Secondary | ICD-10-CM | POA: Diagnosis not present

## 2021-07-24 ENCOUNTER — Encounter (HOSPITAL_BASED_OUTPATIENT_CLINIC_OR_DEPARTMENT_OTHER): Payer: Medicare Other | Admitting: Internal Medicine

## 2021-07-24 DIAGNOSIS — L97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 with unspecified severity: Secondary | ICD-10-CM

## 2021-07-24 NOTE — Progress Notes (Addendum)
NEVEN, FINA ([id number]) Visit Report for 07/24/2021 Arrival Information Details Patient Name: Date of Service: LEVANTE, SIMONES RD C. 07/24/2021 9:15 A M Medical Record Number: [id number] Patient Account Number: [id number] Date of Birth/Sex: Treating RN: 15-Jul-1946 (75 y.o. Marcheta Grammes Primary Care Deamonte Sayegh: Yaakov Guthrie Other Clinician: Referring Tyshawna Alarid: Treating Mayla Biddy/Extender: Leigh Aurora in Treatment: 6 Visit Information History Since Last Visit Added or deleted any medications: No Patient Arrived: Ambulatory Any new allergies or adverse reactions: No Arrival Time: 10:14 Had a fall or experienced change in No Accompanied By: wife activities of daily living that may affect Transfer Assistance: None risk of falls: Patient Requires Transmission-Based Precautions: No Signs or symptoms of abuse/neglect since last visito No Patient Has Alerts: Yes Hospitalized since last visit: No Patient Alerts: Patient on Blood Thinner Implantable device outside of the clinic excluding No R ABI: 1.01 TBI: 0.77 cellular tissue based products placed in the center L ABI: 1.22 TBI: 0.87 since last visit: 05/01/21 Has Dressing in Place as Prescribed: Yes Has Footwear/Offloading in Place as Prescribed: Yes Right: T Contact Cast otal Pain Present Now: Yes Electronic Signature(s) Signed: 07/24/2021 10:14:48 AM By: Lorrin Jackson Entered By: Lorrin Jackson on 07/24/2021 10:14:48 -------------------------------------------------------------------------------- Encounter Discharge Information Details Patient Name: Date of Service: [name] 07/24/2021 9:15 A M Medical Record Number: [id number] Patient Account Number: [id number] Date of Birth/Sex: Treating RN: 01-May-1946 (75 y.o. Marcheta Grammes Primary Care Rise Traeger: Yaakov Guthrie Other Clinician: Referring Bladimir Auman: Treating Tiffanny Lamarche/Extender: Leigh Aurora in Treatment: 6 Encounter  Discharge Information Items Discharge Condition: Stable Ambulatory Status: Ambulatory Discharge Destination: Home Transportation: Private Auto Accompanied By: wife Schedule Follow-up Appointment: Yes Clinical Summary of Care: Provided on 07/24/2021 Form Type Recipient Paper Patient Patient Electronic Signature(s) Signed: 07/24/2021 10:18:28 AM By: Lorrin Jackson Entered By: Lorrin Jackson on 07/24/2021 10:18:28 -------------------------------------------------------------------------------- Lower Extremity Assessment Details Patient Name: Date of Service: [name] 07/24/2021 9:15 A M Medical Record Number: [id number] Patient Account Number: [id number] Date of Birth/Sex: Treating RN: 1946-01-09 (75 y.o. Marcheta Grammes Primary Care Dareld Mcauliffe: Yaakov Guthrie Other Clinician: Referring Zakari Couchman: Treating Blair Mesina/Extender: [name] Weeks in Treatment: 6 Edema Assessment Assessed: Shirlyn Goltz: No] [Right: Yes] Edema: [Left: N] [Right: o] Calf Left: Right: Point of Measurement: 34 cm From Medial Instep 41.8 cm Ankle Left: Right: Point of Measurement: 13 cm From Medial Instep 25 cm Electronic Signature(s) Signed: 07/24/2021 10:15:47 AM By: Lorrin Jackson Entered By: Lorrin Jackson on 07/24/2021 10:15:47 -------------------------------------------------------------------------------- Multi Wound Chart Details Patient Name: Date of Service: [name] 07/24/2021 9:15 A M Medical Record Number: [id number] Patient Account Number: [id number] Date of Birth/Sex: Treating RN: 1946-08-30 (75 y.o. Marcheta Grammes Primary Care Lavonte Palos: Yaakov Guthrie Other Clinician: Referring Basil Blakesley: Treating Senetra Dillin/Extender: [name] Weeks in Treatment: 6 Wound Assessments Treatment Notes Electronic Signature(s) Signed: 07/24/2021 10:19:19 AM By: Kalman Shan DO Signed: 07/24/2021 1:36:18 PM By: Fara Chute By: Kalman Shan on  07/24/2021 10:15:10 -------------------------------------------------------------------------------- Multi-Disciplinary Care Plan Details Patient Name: Date of Service: [name] 07/24/2021 9:15 A M Medical Record Number: [id number] Patient Account Number: [id number] Date of Birth/Sex: Treating RN: 09/20/1946 (75 y.o. Marcheta Grammes Primary Care Almarosa Bohac: Yaakov Guthrie Other Clinician: Referring Saima Monterroso: Treating Shontae Rosiles/Extender: Leigh Aurora in Treatment: 6 Active Inactive Wound/Skin Impairment Nursing Diagnoses: Impaired tissue integrity Goals: Patient/caregiver will verbalize understanding of skin care regimen Date Initiated: 06/11/2021 Date Inactivated: 07/15/2021 Target Resolution Date: 07/17/2021 Goal Status: Met  Ulcer/skin breakdown will have a volume reduction of 30% by week 4 Date Initiated: 06/11/2021 Date Inactivated: 07/15/2021 Target Resolution Date: 07/17/2021 Goal Status: Met Ulcer/skin breakdown will have a volume reduction of 50% by week 8 Date Initiated: 07/15/2021 Target Resolution Date: 08/12/2021 Goal Status: Active Interventions: Assess patient/caregiver ability to obtain necessary supplies Assess patient/caregiver ability to perform ulcer/skin care regimen upon admission and as needed Assess ulceration(s) every visit Notes: Electronic Signature(s) Signed: 07/24/2021 10:17:05 AM By: Lorrin Jackson Entered By: Lorrin Jackson on 07/24/2021 10:17:05 -------------------------------------------------------------------------------- Pain Assessment Details Patient Name: Date of Service: [name] 07/24/2021 9:15 A M Medical Record Number: [id number] Patient Account Number: [id number] Date of Birth/Sex: Treating RN: Jan 29, 1946 (75 y.o. Marcheta Grammes Primary Care Clemens Lachman: Yaakov Guthrie Other Clinician: Referring Gwyn Hieronymus: Treating Keano Guggenheim/Extender: Leigh Aurora in Treatment: 6 Active  Problems Location of Pain Severity and Description of Pain Patient Has Paino Yes Site Locations Pain Location: Pain in Ulcers Rate the pain. Current Pain Level: 8 Character of Pain Describe the Pain: Other: Cast rubbing Pain Management and Medication Current Pain Management: Medication: No Cold Application: No Rest: No Massage: No Activity: No T.E.N.S.: No Heat Application: No Leg drop or elevation: No Is the Current Pain Management Adequate: Inadequate Electronic Signature(s) Signed: 07/24/2021 10:15:39 AM By: Lorrin Jackson Entered By: Lorrin Jackson on 07/24/2021 10:15:39 -------------------------------------------------------------------------------- Patient/Caregiver Education Details Patient Name: Date of Service: [name] 8/5/2022andnbsp9:15 A M Medical Record Number: [id number] Patient Account Number: [id number] Date of Birth/Gender: Treating RN: [birth year] (75 y.o. Marcheta Grammes Primary Care Physician: Yaakov Guthrie Other Clinician: Referring Physician: Treating Physician/Extender: Leigh Aurora in Treatment: 6 Education Assessment Education Provided To: Patient Education Topics Provided Offloading: Methods: Explain/Verbal, Printed Responses: State content correctly Wound/Skin Impairment: Methods: Explain/Verbal, Printed Responses: State content correctly Electronic Signature(s) Signed: 07/24/2021 1:36:18 PM By: Lorrin Jackson Entered By: Lorrin Jackson on 07/24/2021 10:17:23 -------------------------------------------------------------------------------- Wound Assessment Details Patient Name: Date of Service: [name] 07/24/2021 9:15 A M Medical Record Number: [id number] Patient Account Number: [id number] Date of Birth/Sex: Treating RN: 1946/03/06 (75 y.o. Marcheta Grammes Primary Care Alanea Woolridge: Yaakov Guthrie Other Clinician: Referring Breaker Springer: Treating Graeden Bitner/Extender: [name] Weeks in Treatment: 6 Wound Status Wound Number: 4 Primary Neuropathic Ulcer-Non Diabetic Etiology: Wound Location: Right, Lateral Foot Wound Open Wounding Event: Gradually Appeared Status: Date Acquired: 03/30/2021 Date Acquired: 03/30/2021 Comorbid Glaucoma, Chronic Obstructive Pulmonary Disease (COPD), Sleep Weeks Of Treatment: 6 History: Apnea, Hypertension, Neuropathy, Received Chemotherapy, Clustered Wound: No Received Radiation Wound Measurements Length: (cm) 0.7 Width: (cm) 1.5 Depth: (cm) 0.1 Area: (cm) 0.825 Volume: (cm) 0.082 % Reduction in Area: 85% % Reduction in Volume: 85.1% Epithelialization: Medium (34-66%) Tunneling: No Undermining: No Wound Description Classification: Full Thickness Without Exposed Support Structures Wound Margin: Distinct, outline attached Exudate Amount: Medium Exudate Type: Serosanguineous Exudate Color: red, brown Foul Odor After Cleansing: No Slough/Fibrino No Wound Bed Granulation Amount: Large (67-100%) Exposed Structure Granulation Quality: Pink, Friable Fascia Exposed: No Necrotic Amount: None Present (0%) Fat Layer (Subcutaneous Tissue) Exposed: Yes Tendon Exposed: No Muscle Exposed: No Joint Exposed: No Bone Exposed: No Treatment Notes Wound #4 (Foot) Wound Laterality: Right, Lateral Cleanser Soap and Water Discharge Instruction: May shower and wash wound with dial antibacterial soap and water prior to dressing change. Wound Cleanser Discharge Instruction: Cleanse the wound with wound cleanser prior to applying a clean dressing using gauze sponges, not tissue or cotton balls. Peri-Wound Care Topical Primary Dressing Hydrofera Blue  Classic Foam, 2x2 in Discharge Instruction: Moisten with saline prior to applying to wound bed. Apply over Santyl. Secondary Dressing Woven Gauze Sponge, Non-Sterile 4x4 in Discharge Instruction: Apply over primary dressing as directed. Drawtex 4x4 in Discharge Instruction:  Apply over primary dressing as directed. Secured With 53M [place] Surgical T ape, 2x2 (in/yd) Discharge Instruction: Secure dressing with tape as directed. Compression Wrap Compression Stockings Add-Ons Electronic Signature(s) Signed: 07/24/2021 10:16:11 AM By: Lorrin Jackson Entered By: Lorrin Jackson on 07/24/2021 10:16:10 -------------------------------------------------------------------------------- Vitals Details Patient Name: Date of Service: [name] 07/24/2021 9:15 A M Medical Record Number: [id number] Patient Account Number: [id number] Date of Birth/Sex: Treating RN: Jul 27, 1946 (75 y.o. Marcheta Grammes Primary Care Dash Cardarelli: Yaakov Guthrie Other Clinician: Referring Markie Frith: Treating Shinika Estelle/Extender: Leigh Aurora in Treatment: 6 Vital Signs Time Taken: 10:00 Temperature (F): 98.4 Height (in): 69 Pulse (bpm): 98 Weight (lbs): 210 Respiratory Rate (breaths/min): 18 Body Mass Index (BMI): 31 Blood Pressure (mmHg): 155/80 Reference Range: 80 - 120 mg / dl Electronic Signature(s) Signed: 07/24/2021 10:15:10 AM By: Lorrin Jackson Entered By: Lorrin Jackson on 07/24/2021 10:15:10

## 2021-07-24 NOTE — Progress Notes (Signed)
JEROL, RUFENER ([id number]) Visit Report for 07/23/2021 Arrival Information Details Patient Name: Date of Service: GINA, COSTILLA RD C. 07/23/2021 2:00 PM Medical Record Number: [id number] Patient Account Number: [id number] Date of Birth/Sex: Treating RN: [birth year] (75 y.o. Marcheta Grammes Primary Care Clent Damore: Yaakov Guthrie Other Clinician: Referring Madisyn Mawhinney: Treating Raaga Maeder/Extender: Leigh Aurora in Treatment: 6 Visit Information History Since Last Visit Added or deleted any medications: No Patient Arrived: Ambulatory Any new allergies or adverse reactions: No Arrival Time: 14:09 Had a fall or experienced change in No Accompanied By: wife activities of daily living that may affect Transfer Assistance: None risk of falls: Patient Requires Transmission-Based Precautions: No Signs or symptoms of abuse/neglect since last visito No Patient Has Alerts: Yes Hospitalized since last visit: No Patient Alerts: Patient on Blood Thinner Implantable device outside of the clinic excluding No R ABI: 1.01 TBI: 0.77 cellular tissue based products placed in the center L ABI: 1.22 TBI: 0.87 since last visit: 05/01/21 Has Dressing in Place as Prescribed: Yes Has Footwear/Offloading in Place as Prescribed: Yes Right: T Contact Cast otal Pain Present Now: No Electronic Signature(s) Signed: 07/24/2021 1:36:18 PM By: Lorrin Jackson Entered By: Lorrin Jackson on 07/23/2021 14:14:26 -------------------------------------------------------------------------------- Encounter Discharge Information Details Patient Name: Date of Service: [name] 07/23/2021 2:00 PM Medical Record Number: [id number] Patient Account Number: [id number] Date of Birth/Sex: Treating RN: 11/28/1946 (75 y.o. [name] Primary Care Salia Cangemi: Yaakov Guthrie Other Clinician: Referring Calib Wadhwa: Treating Jermiya Reichl/Extender: Leigh Aurora in Treatment: 6 Encounter  Discharge Information Items Post Procedure Vitals Discharge Condition: Stable Temperature (F): 98.1 Ambulatory Status: Ambulatory Pulse (bpm): 101 Discharge Destination: Home Respiratory Rate (breaths/min): 18 Transportation: Private Auto Blood Pressure (mmHg): 158/83 Accompanied By: spouse Schedule Follow-up Appointment: Yes Clinical Summary of Care: Patient Declined Electronic Signature(s) Signed: 07/24/2021 12:35:09 PM By: Levan Hurst RN, BSN Entered By: Levan Hurst on 07/23/2021 16:44:19 -------------------------------------------------------------------------------- Lower Extremity Assessment Details Patient Name: Date of Service: [name] Steele 07/23/2021 2:00 PM Medical Record Number: [id number] Patient Account Number: [id number] Date of Birth/Sex: Treating RN: [birth year] (75 y.o. Marcheta Grammes Primary Care Eilan Mcinerny: Yaakov Guthrie Other Clinician: Referring Daequan Kozma: Treating Lahela Woodin/Extender: [name] Weeks in Treatment: 6 Edema Assessment Assessed: Shirlyn Goltz: No] [Right: Yes] Edema: [Left: N] [Right: o] Calf Left: Right: Point of Measurement: 34 cm From Medial Instep 41.8 cm Ankle Left: Right: Point of Measurement: 13 cm From Medial Instep 25 cm Vascular Assessment Pulses: Dorsalis Pedis Palpable: [Right:Yes] Electronic Signature(s) Signed: 07/24/2021 1:36:18 PM By: Lorrin Jackson Entered By: Lorrin Jackson on 07/23/2021 14:28:50 -------------------------------------------------------------------------------- Multi Wound Chart Details Patient Name: Date of Service: [name] 07/23/2021 2:00 PM Medical Record Number: [id number] Patient Account Number: [id number] Date of Birth/Sex: Treating RN: 15-Oct-1946 (75 y.o. 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] [N/A:N/A] Primary Etiology: [4:Glaucoma, Chronic Obstructive] [N/A:N/A] Comorbid History: [4:Pulmonary Disease (COPD), Sleep Apnea, Hypertension, Neuropathy, Received Chemotherapy, Received Radiation 03/30/2021] [N/A:N/A] Date Acquired: [4:6] [N/A:N/A] Weeks of Treatment: [4:Open] [N/A:N/A] Wound Status: [4:0.7x1.5x0.1] [N/A:N/A] Measurements L x W x D (cm) [4:0.825] [N/A:N/A] A (cm) : rea [4:0.082] [N/A:N/A] Volume (cm) : [4:85.00%] [N/A:N/A] % Reduction in A [4:rea: 85.10%] [N/A:N/A] % Reduction  in Volume: [4:Full Thickness Without Exposed] [N/A:N/A] Classification: [4:Support Structures Medium] [N/A:N/A] Exudate A mount: [4:Serosanguineous] [N/A:N/A] Exudate Type: [4:red, brown] [N/A:N/A] Exudate Color: [4:Distinct, outline attached] [N/A:N/A] Wound Margin: [4:Large (67-100%)] [N/A:N/A] Granulation A mount: [4:Pink, Friable] [N/A:N/A] Granulation Quality: [4:None Present (0%)] [N/A:N/A] Necrotic A mount: [4:Fat Layer (Subcutaneous Tissue): Yes N/A] Exposed Structures: [4:Fascia: No Tendon: No Muscle: No Joint: No Bone: No Medium (34-66%)] [N/A:N/A] Epithelialization: [4:Debridement - Excisional] [N/A:N/A] Debridement: Pre-procedure Verification/Time Out 15:20 [N/A:N/A] Taken: [4:Lidocaine 4% Topical Solution] [N/A:N/A] Pain Control: [4:Callus, Subcutaneous, Slough] [N/A:N/A] Tissue Debrided: [4:Skin/Subcutaneous Tissue] [N/A:N/A] Level: [4:1.8] [N/A:N/A] Debridement A (sq cm): [4:rea Curette] [N/A:N/A] Instrument: [4:Minimum] [N/A:N/A] Bleeding: [4:Pressure] [N/A:N/A] Hemostasis A chieved: [4:0] [N/A:N/A] Procedural Pain: [4:0] [N/A:N/A] Post Procedural Pain: [4:Procedure was  tolerated well] [N/A:N/A] Debridement Treatment Response: [4:0.7x1.5x0.2] [N/A:N/A] Post Debridement Measurements L x W x D (cm) [4:0.165] [N/A:N/A] Post Debridement Volume: (cm) [4:Debridement] [N/A:N/A] Procedures Performed: [4:T Contact Cast otal] Treatment Notes Electronic Signature(s) Signed: 07/23/2021 4:02:34 PM By: Kalman Shan DO Signed: 07/23/2021 6:14:36 PM By: Deon Pilling Entered By: Kalman Shan on 07/23/2021 15:59:38 -------------------------------------------------------------------------------- Multi-Disciplinary Care Plan Details Patient Name: Date of Service: [name] 07/23/2021 2:00 PM Medical Record Number: [id number] Patient Account Number: [id number] Date of Birth/Sex: Treating RN: 1946-09-27 (75 y.o. Hessie Diener Primary Care Felicita Nuncio: Yaakov Guthrie Other Clinician: Referring Giannina Bartolome: Treating Khaleel Beckom/Extender: Leigh Aurora in Treatment: 6 Active Inactive Wound/Skin Impairment Nursing Diagnoses: Impaired tissue integrity Goals: Patient/caregiver will verbalize understanding of skin care regimen Date Initiated: 06/11/2021 Date Inactivated: 07/15/2021 Target Resolution Date: 07/17/2021 Goal Status: Met Ulcer/skin breakdown will have a volume reduction of 30% by week 4 Date Initiated: 06/11/2021 Date Inactivated: 07/15/2021 Target Resolution Date: 07/17/2021 Goal Status: Met Ulcer/skin breakdown will have a volume reduction of 50% by week 8 Date Initiated: 07/15/2021 Target Resolution Date: 08/12/2021 Goal Status: Active Interventions: Assess patient/caregiver ability to obtain necessary supplies Assess patient/caregiver ability to perform ulcer/skin care regimen upon admission and as needed Assess ulceration(s) every visit Notes: Electronic Signature(s) Signed: 07/23/2021 6:14:36 PM By: Deon Pilling Entered By: Deon Pilling on 07/23/2021  14:47:40 -------------------------------------------------------------------------------- Pain Assessment Details Patient Name: Date of Service: ANTAWAN, MCHUGH RD C. 07/23/2021 2:00 PM Medical Record Number: [id number] Patient Account Number: [id number] Date of Birth/Sex: Treating RN: 1946-01-18 (75 y.o. Marcheta Grammes Primary Care Anoop Hemmer: Yaakov Guthrie Other Clinician: Referring Geraldy Akridge: Treating Maziyah Vessel/Extender: Leigh Aurora in Treatment: 6 Active Problems Location of Pain Severity and Description of Pain Patient Has Paino No Site Locations Pain Management and Medication Current Pain Management: Electronic Signature(s) Signed: 07/24/2021 1:36:18 PM By: Lorrin Jackson Entered By: Lorrin Jackson on 07/23/2021 14:15:00 -------------------------------------------------------------------------------- Patient/Caregiver Education Details Patient Name: Date of Service: [name] RD [name] 8/4/2022andnbsp2:00 PM Medical Record Number: [id number] Patient Account Number: [id number] Date of Birth/Gender: Treating RN: May 08, 1946 (75 y.o. Hessie Diener Primary Care Physician: Yaakov Guthrie Other Clinician: Referring Physician: Treating Physician/Extender: Leigh Aurora in Treatment: 6 Education Assessment Education Provided To: Patient Education Topics Provided Wound/Skin Impairment: Handouts: Skin Care Do's and Dont's Methods: Explain/Verbal Responses: Reinforcements needed Electronic Signature(s) Signed: 07/23/2021 6:14:36 PM By: Deon Pilling Entered By: Deon Pilling on 07/23/2021 14:48:17 -------------------------------------------------------------------------------- Wound Assessment Details Patient Name: Date of Service: [name] 07/23/2021 2:00 PM Medical Record Number: [id number] Patient Account Number: [id number] Date of Birth/Sex: Treating RN: 02-Dec-1946 (75 y.o. Marcheta Grammes Primary Care Garvey Westcott: Yaakov Guthrie Other Clinician: Referring Lesli Issa: Treating Blaise Grieshaber/Extender: Leigh Aurora in Treatment: 6  Wound Status Wound Number: 4 Primary Neuropathic Ulcer-Non Diabetic Etiology: Wound Location: Right, Lateral Foot Wound Open Wounding Event: Gradually Appeared Status: Date Acquired: 03/30/2021 Comorbid Glaucoma, Chronic Obstructive Pulmonary Disease (COPD), Sleep Weeks Of Treatment: 6 History: Apnea, Hypertension, Neuropathy, Received Chemotherapy, Clustered Wound: No Received Radiation Photos Wound Measurements Length: (cm) 0.7 Width: (cm) 1.5 Depth: (cm) 0.1 Area: (cm) 0.825 Volume: (cm) 0.082 % Reduction in Area: 85% % Reduction in Volume: 85.1% Epithelialization: Medium (34-66%) Tunneling: No Undermining: No Wound Description Classification: Full Thickness Without Exposed Support Structures Wound Margin: Distinct, outline attached Exudate Amount: Medium Exudate Type: Serosanguineous Exudate Color: red, brown Foul Odor After Cleansing: No Slough/Fibrino No Wound Bed Granulation Amount: Large (67-100%) Exposed Structure Granulation Quality: Pink, Friable Fascia Exposed: No Necrotic Amount: None Present (0%) Fat Layer (Subcutaneous Tissue) Exposed: Yes Tendon Exposed: No Muscle Exposed: No Joint Exposed: No Bone Exposed: No Electronic Signature(s) Signed: 07/24/2021 1:36:18 PM By: Lorrin Jackson Entered By: Lorrin Jackson on 07/23/2021 14:23:48 -------------------------------------------------------------------------------- Vitals Details Patient Name: Date of Service: [name] 07/23/2021 2:00 PM Medical Record Number: [id number] Patient Account Number: [id number] Date of Birth/Sex: Treating RN: 1946-08-06 (75 y.o. Marcheta Grammes Primary Care Cleotis Sparr: Yaakov Guthrie Other Clinician: Referring Gianno Volner: Treating Tiffnay Bossi/Extender: Leigh Aurora in Treatment: 6 Vital Signs Time Taken:  14:14 Temperature (F): 98.1 Height (in): 69 Pulse (bpm): 101 Weight (lbs): 210 Respiratory Rate (breaths/min): 18 Body Mass Index (BMI): 31 Blood Pressure (mmHg): 158/83 Reference Range: 80 - 120 mg / dl Electronic Signature(s) Signed: 07/24/2021 1:36:18 PM By: Lorrin Jackson Entered By: Lorrin Jackson on 07/23/2021 14:14:51

## 2021-07-24 NOTE — Progress Notes (Signed)
JENNIE, BOLAR ([id number]) Visit Report for 07/24/2021 Chief Complaint Document Details Patient Name: Date of Service: AREND, BAHL RD C. 07/24/2021 9:15 A M Medical Record Number: [id number] Patient Account Number: [id number] Date of Birth/Sex: Treating RN: 1946/10/23 (75 y.o. Marcheta Grammes Primary Care Provider: Yaakov Guthrie Other Clinician: Referring Provider: Treating Provider/Extender: Leigh Aurora in Treatment: 6 Information Obtained from: Patient Chief Complaint Right foot wound Electronic Signature(s) Signed: 07/24/2021 10:19:19 AM By: Kalman Shan DO Entered By: Kalman Shan on 07/24/2021 10:15:17 -------------------------------------------------------------------------------- HPI Details Patient Name: Date of Service: [name] 07/24/2021 9:15 A M Medical Record Number: [id number] Patient Account Number: [id number] Date of Birth/Sex: Treating RN: 1946-08-21 (75 y.o. Marcheta Grammes Primary Care Provider: Yaakov Guthrie Other Clinician: Referring Provider: Treating Provider/Extender: Leigh Aurora in Treatment: 6 History of Present Illness HPI Description: Admission 6/23 Mr. 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 He states this started a little over 2 months ago. This is now the third time he has developed a wound to this area. He followed with podiatry for this issue and has been using Betadine daily to it. He recently switched to silver alginate a week ago. He has been on doxycycline since May For this issue and just recently started a course of Keflex prescribed in the ED on 6/18 For possible cellulitis. Currently denies signs of infection. He uses a front offloading shoe to help with relieving the pressure associated with the wound formation. He reports  minimal drainage. He does not have sensation to his feet and denies any pain. Currently denies signs of infection. 6/29; this is a patient with chemotherapy-induced neuropathy. We had in clinic last year he was readmitted to our clinic last week. He has an area on the right fifth metatarsal head. Dr. Heber  admitted to the to the clinic last week started him on Santyl they are using this daily. The wound started in March she went to podiatry. They are using Betadine to this I am not really sure about the benefit of Betadine in this type of situation. There is not appear to be any active infection. He is using a forefoot offloading boot 7/6; wound on the right fifth metatarsal head. Using Santyl daily. He is using a forefoot offloading boot but claims to be staying off this religiously Again talk to me about a vacation they had planned for mid August. They have a cancellation insurance and want to go ahead and put this in. I asked him to see if there is a specific form they want filled out. It does seem unlikely that he will be healed by that time [August 12]. Furthermore there are apparently no elevators they be going up stairs. 7/13; right fifth met head. Still not a viable surface using Hydrofera Blue and Santyl. Offloading in a forefoot offloading 7/20; right fifth metatarsal head. Surface looks better. Dimensions down by 4 x 3 mm. We will moved to straight [facility] with a total contact cast today His wife asked me to look at some erythema on the left anterior lower leg. It is indeed warm but not tender [however he is neuropathic]. He also has some degree of venous insufficiency I wonder some of this could be stasis dermatitis 7/22; patient presents for obligatory cast change. He reports not issues with the cast placed  earlier this week. He tolerated this well. He has no complaints or questions today. 7/29; the patient has no complaints. The wound actually looks quite good rim of  epithelialization nice healthy granulation. But only minimal improvement in size 8/4; patient presents for follow-up. He has no issues or complaints today. He has tolerated the cast well over the past week. He denies signs of infection. 8/5; patient was seen yesterday. He states that the cast was uncomfortable and rubbing at the top. He is here for evaluation of replacement. Electronic Signature(s) Signed: 07/24/2021 10:19:19 AM By: Kalman Shan DO Entered By: Kalman Shan on 07/24/2021 10:15:49 -------------------------------------------------------------------------------- Physical Exam Details Patient Name: Date of Service: [name] 07/24/2021 9:15 A M Medical Record Number: [id number] Patient Account Number: [id number] Date of Birth/Sex: Treating RN: 20-Jul-1946 (75 y.o. Marcheta Grammes Primary Care Provider: Yaakov Guthrie Other Clinician: Referring Provider: Treating Provider/Extender: Leigh Aurora in Treatment: 6 Constitutional respirations regular, non-labored and within target range for patient.. Cardiovascular 2+ dorsalis pedis/posterior tibialis pulses. Psychiatric pleasant and cooperative. Notes T the right lower extremity there are no areas of skin breakdown from where the cast ended. No signs of irritation o Electronic Signature(s) Signed: 07/24/2021 10:19:19 AM By: Kalman Shan DO Entered By: Kalman Shan on 07/24/2021 10:17:04 -------------------------------------------------------------------------------- Physician Orders Details Patient Name: Date of Service: [name] 07/24/2021 9:15 A M Medical Record Number: [id number] Patient Account Number: [id number] Date of Birth/Sex: Treating RN: 08-06-1946 (75 y.o. 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 Margarita Grizzle Tuesday Return A Bathing/ Shower/ Hygiene May shower with protection but do not get wound dressing(s) wet. Edema Control - Lymphedema / SCD / Other Elevate legs to the level of the heart or above for 30 minutes daily and/or when sitting, a frequency of: - throughout the day Avoid standing for long periods of time. Off-Loading Total Contact Cast to Right Lower Extremity Wound Treatment Wound #4 - Foot Wound Laterality: Right, Lateral Cleanser: Soap and Water 1 x Per Week/7 Days Discharge Instructions: May shower and wash wound with dial antibacterial soap and water prior to dressing change. Cleanser: Wound Cleanser (Generic) 1 x Per Week/7 Days Discharge Instructions: Cleanse the wound with wound cleanser prior to applying a clean dressing using gauze sponges, not tissue or cotton balls. Prim Dressing: Hydrofera Blue Classic Foam, 2x2 in (Generic) 1 x Per Week/7 Days ary Discharge Instructions: Moisten with saline prior to applying to wound bed. Apply over Santyl. Secondary Dressing: Woven Gauze Sponge, Non-Sterile 4x4 in (Generic) 1 x Per Week/7 Days Discharge Instructions: Apply over primary dressing as directed. Secondary Dressing: Drawtex 4x4 in 1 x Per Week/7 Days Discharge Instructions: Apply over primary dressing as directed. Secured With: 50M Medipore H Soft Cloth Surgical Tape, 2x2 (in/yd) (Generic) 1 x Per Week/7 Days Discharge Instructions: Secure dressing with tape as directed. Electronic Signature(s) Signed: 07/24/2021 10:19:19 AM By: Kalman Shan DO Previous Signature: 07/24/2021  10:16:55 AM Version By: Lorrin Jackson Entered By: Kalman Shan on 07/24/2021 10:17:18 -------------------------------------------------------------------------------- Problem List Details Patient Name: Date of Service: [name]  RD C. 07/24/2021 9:15 A M Medical Record Number: [id number] Patient Account Number: [id number] Date of Birth/Sex: Treating RN: [birth year] (75 y.o. 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 06/11/2021 No Yes J44.9 Chronic obstructive pulmonary disease, unspecified 06/11/2021 No Yes G47.33 Obstructive sleep apnea (adult) (pediatric) 06/11/2021 No Yes Z86.718 Personal history of other venous thrombosis and embolism 06/11/2021 No Yes L03.116 Cellulitis of left lower limb 07/08/2021 No Yes Inactive Problems Resolved Problems Electronic Signature(s) Signed: 07/24/2021 10:19:19 AM By: Kalman Shan DO Entered By: Kalman Shan on 07/24/2021 10:15:06 -------------------------------------------------------------------------------- Progress Note Details Patient Name: Date of Service: [name] 07/24/2021 9:15 A M Medical Record Number: [id number] Patient Account Number: [id number] Date of Birth/Sex: Treating RN: [birth year] (75 y.o. Marcheta Grammes Primary Care Provider: Yaakov Guthrie Other Clinician: Referring Provider: Treating Provider/Extender: Leigh Aurora in Treatment: 6 Subjective Chief Complaint Information obtained from Patient Right foot wound History of Present Illness (HPI) Admission 6/23 Mr. Giovani Neumeister is a 75 year old male with a past medical history of chemo induced neuropathy from a history of adenocarcinoma of  the right lung and COPD that presents for a right foot wound. He states this started a little over 2 months ago. This is now the third time he has developed a wound to this area. He followed with podiatry for this issue and has been using Betadine daily to it. He recently switched to silver alginate a week ago. He has been on doxycycline since May For this issue and just recently started a course of Keflex prescribed in the ED on 6/18 For possible cellulitis. Currently denies signs of infection. He uses a front offloading shoe to help with relieving the pressure associated with the wound formation. He reports minimal drainage. He does not have sensation to his feet and denies any pain. Currently denies signs of infection. 6/29; this is a patient with chemotherapy-induced neuropathy. We had in clinic last year he was readmitted to our clinic last week. He has an area on the right fifth metatarsal head. Dr. Heber [place] admitted to the to the clinic last week started him on Santyl they are using this daily. The wound started in March she went to podiatry. They are using Betadine to this I am not really sure about the benefit of Betadine in this type of situation. There is not appear to be any active infection. He is using a forefoot offloading boot 7/6; wound on the right fifth metatarsal head. Using Santyl daily. He is using a forefoot offloading boot but claims to be staying off this religiously Again talk to me about a vacation they had planned for mid August. They have a cancellation insurance and want to go ahead and put this in. I asked him to see if there is a specific form they want filled out. It does seem unlikely that he will be healed by that time [August 12]. Furthermore there are apparently no elevators they be going up stairs. 7/13; right fifth met head. Still not a viable surface using Hydrofera Blue and Santyl. Offloading in a forefoot offloading 7/20; right fifth metatarsal head. Surface  looks better. Dimensions down by 4 x 3 mm. We will moved to straight [facility] with a total contact cast today His wife asked me to look  at some erythema on the left anterior lower leg. It is indeed warm but not tender [however he is neuropathic]. He also has some degree of venous insufficiency I wonder some of this could be stasis dermatitis 7/22; patient presents for obligatory cast change. He reports not issues with the cast placed earlier this week. He tolerated this well. He has no complaints or questions today. 7/29; the patient has no complaints. The wound actually looks quite good rim of epithelialization nice healthy granulation. But only minimal improvement in size 8/4; patient presents for follow-up. He has no issues or complaints today. He has tolerated the cast well over the past week. He denies signs of infection. 8/5; patient was seen yesterday. He states that the cast was uncomfortable and rubbing at the top. He is here for evaluation of replacement. Patient History Information obtained from Patient. Family History Cancer - Mother, Heart Disease - Father,Siblings, Hypertension - Mother,Father,Siblings, No family history of Diabetes, Hereditary Spherocytosis, Kidney Disease, Lung Disease, Seizures, Stroke, Thyroid Problems, Tuberculosis. Social History Former smoker - quit 2005, Marital Status - Married, Alcohol Use - Daily - bottle wine per day, Drug Use - No History, Caffeine Use - Daily - coffee. Medical History Eyes Patient has history of Glaucoma - mild Denies history of Cataracts, Optic Neuritis Respiratory Patient has history of Chronic Obstructive Pulmonary Disease (COPD), Sleep Apnea - no CPAP Cardiovascular Patient has history of Hypertension Neurologic Patient has history of Neuropathy Oncologic Patient has history of Received Chemotherapy, Received Radiation Psychiatric Denies history of Anorexia/bulimia, Confinement Anxiety Hospitalization/Surgery  History - left inguinal hernia repair. - lobectomy right and left lung. - mouth cancer surgery. - tonsilectomy. 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 Vitals Time Taken: 10:00 AM, Height: 69 in, Weight: 210 lbs, BMI: 31, Temperature: 98.4 F, Pulse: 98 bpm, Respiratory Rate: 18 breaths/min, Blood Pressure: 155/80 mmHg. Cardiovascular 2+ dorsalis pedis/posterior tibialis pulses. Psychiatric pleasant and cooperative. General Notes: T the right lower extremity there are no areas of skin breakdown from where the cast ended. No signs of irritation o Integumentary (Hair, Skin) Wound #4 status is Open. Original cause of wound was Gradually Appeared. The date acquired was: 03/30/2021. The wound has been in treatment 6 weeks. The wound is located on the Right,Lateral Foot. The wound measures 0.7cm length x 1.5cm width x 0.1cm depth; 0.825cm^2 area and 0.082cm^3 volume. There is Fat Layer (Subcutaneous Tissue) exposed. There is no tunneling or undermining noted. There is a medium amount of serosanguineous drainage noted. The wound margin is distinct with the outline attached to the wound base. There is large (67-100%) pink, friable granulation within the wound bed. There is no necrotic tissue within the wound bed. 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 I did not see any areas of skin breakdown or irritation. We will try and have the cast end o a little higher as this might be causing the discomfort. Procedures Wound #4 Pre-procedure diagnosis of Wound #4 is a Neuropathic Ulcer-Non Diabetic located on the Right,Lateral  Foot . There was a T Contact Cast Procedure by Cephas Darby, Sylver Vantassell, DO. Post procedure Diagnosis Wound #4: Same as Pre-Procedure Plan Follow-up Appointments: Return Appointment in 1 week. Margarita Grizzle Tuesday Bathing/ Shower/ Hygiene: May shower with protection but do not get wound dressing(s) wet. Edema Control - Lymphedema / SCD / Other: Elevate legs to the level of the heart or above for 30 minutes daily and/or when sitting, a frequency of: - throughout the day Avoid standing for long periods of time. Off-Loading: T Contact Cast to Right Lower Extremity otal WOUND #4: - Foot Wound Laterality: Right, Lateral Cleanser: Soap and Water 1 x Per Week/7 Days Discharge Instructions: May shower and wash wound with dial antibacterial soap and water prior to dressing change. Cleanser: Wound Cleanser (Generic) 1 x Per Week/7 Days Discharge Instructions: Cleanse the wound with wound cleanser prior to applying a clean dressing using gauze sponges, not tissue or cotton balls. Prim Dressing: Hydrofera Blue Classic Foam, 2x2 in (Generic) 1 x Per Week/7 Days ary Discharge Instructions: Moisten with saline prior to applying to wound bed. Apply over Santyl. Secondary Dressing: Woven Gauze Sponge, Non-Sterile 4x4 in (Generic) 1 x Per Week/7 Days Discharge Instructions: Apply over primary dressing as directed. Secondary Dressing: Drawtex 4x4 in 1 x Per Week/7 Days Discharge Instructions: Apply over primary dressing as directed. Secured With: 20M Medipore H Soft Cloth Surgical T ape, 2x2 (in/yd) (Generic) 1 x Per Week/7 Days Discharge Instructions: Secure dressing with tape as directed. 1. TCC replaced Electronic  Signature(s) Signed: 07/24/2021 10:19:19 AM By: Kalman Shan DO Entered By: Kalman Shan on 07/24/2021 10:18:35 -------------------------------------------------------------------------------- HxROS Details Patient Name: Date of Service: [name] 07/24/2021 9:15 A M Medical Record Number: [id number] Patient Account Number: [id number] Date of Birth/Sex: Treating RN: 1946/08/15 (75 y.o. 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 Gastrointestinal Medical History: Past Medical History Notes: ischemic colitis Musculoskeletal Medical History: Past Medical History Notes: hx polio as child Neurologic Medical History: Positive for: Neuropathy Oncologic Medical History: Positive for: Received Chemotherapy; Received Radiation Past Medical History Notes: hx lung CA right and left lung, hx mouth CA Psychiatric Medical History: Negative for: Anorexia/bulimia; Confinement Anxiety HBO Extended History Items Eyes: Glaucoma Immunizations Pneumococcal Vaccine: Received Pneumococcal Vaccination: Yes Received Pneumococcal Vaccination On or After 60th Birthday: No Implantable Devices Yes Hospitalization / Surgery History Type of Hospitalization/Surgery left inguinal hernia repair lobectomy right and left lung mouth cancer surgery tonsilectomy Family and Social History Cancer: Yes - Mother; Diabetes: No;  Heart Disease: Yes - Father,Siblings; Hereditary Spherocytosis: No; Hypertension: Yes - Mother,Father,Siblings; Kidney Disease: No; Lung Disease: No; Seizures: No; Stroke: No; Thyroid Problems: No; Tuberculosis: No; Former smoker - quit 2005; Marital Status - Married; Alcohol Use: Daily - bottle wine per day; Drug Use: No History; Caffeine Use: Daily - coffee; Financial Concerns: No; Food, Clothing or Shelter Needs: No; Support System Lacking: No; Transportation Concerns: No Electronic Signature(s) Signed: 07/24/2021 10:19:19 AM By: Kalman Shan DO Signed: 07/24/2021 1:36:18 PM By: Lorrin Jackson Entered By: Kalman Shan on 07/24/2021 10:15:55 -------------------------------------------------------------------------------- Total Contact Cast Details Patient Name:  Date of Service: STARR, ENGEL RD C. 07/24/2021 9:15 A M Medical Record Number: [id number] Patient Account Number: [id number] Date of Birth/Sex: Treating RN: 1946-04-28 (75 y.o. Marcheta Grammes Primary Care Provider: Yaakov Guthrie Other Clinician: Referring Provider: Treating Provider/Extender: Leigh Aurora in Treatment: 6 T Contact Cast Applied for Wound Assessment: otal Wound #4 Right,Lateral Foot Performed By: Physician Kalman Shan, DO Post Procedure Diagnosis Same as Pre-procedure Electronic Signature(s) Signed: 07/24/2021 10:16:31 AM By: Lorrin Jackson Signed: 07/24/2021 10:19:19 AM By: Kalman Shan DO Entered By: Lorrin Jackson on 07/24/2021 10:16:31 -------------------------------------------------------------------------------- SuperBill Details Patient Name: Date of Service: [name] 07/24/2021 Medical Record Number: [id number] Patient Account Number: [id number] Date of Birth/Sex: Treating RN: 09-09-1946 (75 y.o. 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 J44.9 Chronic obstructive pulmonary disease, unspecified G47.33 Obstructive sleep apnea (adult) (pediatric) Z86.718 Personal history of other venous thrombosis and embolism L03.116 Cellulitis of left lower limb Facility Procedures CPT4 Code: [id number] Description: [phone] - APPLY TOTAL CONTACT LEG CAST ICD-10 Diagnosis Description L97.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

## 2021-07-24 NOTE — Progress Notes (Signed)
TASMAN, [name] ([id number]) Visit Report for 07/23/2021 Chief Complaint Document Details Patient Name: Date of Service: [name], Caleb RD C. 07/23/2021 2:00 PM Medical Record Number: [id number] Patient Account Number: [id number] Date of Birth/Sex: Treating RN: 08/14/1946 (75 y.o. [name] Primary Care Provider: Yaakov Guthrie Other Clinician: Referring Provider: Treating Provider/Extender: Leigh Aurora in Treatment: 6 Information Obtained from: Patient Chief Complaint Right foot wound Electronic Signature(s) Signed: 07/23/2021 4:02:34 PM By: Kalman Shan DO Entered By: Kalman Shan on 07/23/2021 15:59:45 -------------------------------------------------------------------------------- Debridement Details Patient Name: Date of Service: Caleb Alken RD C. 07/23/2021 2:00 PM Medical Record Number: [id number] Patient Account Number: [id number] Date of Birth/Sex: Treating RN: 06/12/1946 (75 y.o. [name], Tammi Klippel Primary Care Provider: Yaakov Guthrie Other Clinician: Referring Provider: Treating Provider/Extender: Leigh Aurora in Treatment: 6 Debridement Performed for Assessment: Wound #4 Right,Lateral Foot Performed By: Physician Kalman Shan, DO Debridement Type: Debridement Level of Consciousness (Pre-procedure): Awake and Alert Pre-procedure Verification/Time Out Yes - 15:20 Taken: Start Time: 15:21 Pain Control: Lidocaine 4% T opical Solution T Area Debrided (L x W): otal 1 (cm) x 1.8 (cm) = 1.8 (cm) Tissue and other material debrided: Viable, Non-Viable, Callus, Slough, Subcutaneous, Skin: Dermis , Skin: Epidermis, Fibrin/Exudate, Slough Level: Skin/Subcutaneous Tissue Debridement Description: Excisional Instrument: Curette Bleeding: Minimum Hemostasis Achieved: Pressure End Time: 15:27 Procedural Pain: 0 Post Procedural Pain: 0 Response to Treatment: Procedure was tolerated well Level of Consciousness (Post-  Awake and Alert procedure): Post Debridement Measurements of Total Wound Length: (cm) 0.7 Width: (cm) 1.5 Depth: (cm) 0.2 Volume: (cm) 0.165 Character of Wound/Ulcer Post Debridement: Requires Further Debridement Post Procedure Diagnosis Same as Pre-procedure Electronic Signature(s) Signed: 07/23/2021 4:02:34 PM By: Kalman Shan DO Signed: 07/23/2021 6:14:36 PM By: Deon Pilling Entered By: Deon Pilling on 07/23/2021 15:28:39 -------------------------------------------------------------------------------- HPI Details Patient Name: Date of Service: Caleb Alken RD C. 07/23/2021 2:00 PM Medical Record Number: [id number] Patient Account Number: [id number] Date of Birth/Sex: Treating RN: 1946/06/24 (75 y.o. [name] Primary Care Provider: Yaakov Guthrie Other Clinician: Referring Provider: Treating Provider/Extender: Leigh Aurora in Treatment: 6 History of Present Illness HPI Description: Admission 6/23 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 He states this started a little over 2 months ago. This is now the third time he has developed a wound to this area. He followed with podiatry for this issue and has been using Betadine daily to it. He recently switched to silver alginate a week ago. He has been on doxycycline since May For this issue and just recently started a course of Keflex prescribed in the ED on 6/18 For possible cellulitis. Currently denies signs of infection. He uses a front offloading shoe to help with relieving the pressure associated with the wound formation. He reports minimal drainage. He does not have sensation to his feet and denies any pain. Currently denies signs of infection. 6/29; this is a patient with chemotherapy-induced neuropathy. We had in clinic last year he was readmitted to  our clinic last week. He has an area on the right fifth metatarsal head. Dr. Heber [place] admitted to the to the clinic last week started him on Santyl they are using this daily. The wound started in March she went to podiatry. They are using Betadine to this I am not really sure about the benefit of Betadine in  this type of situation. There is not appear to be any active infection. He is using a forefoot offloading boot 7/6; wound on the right fifth metatarsal head. Using Santyl daily. He is using a forefoot offloading boot but claims to be staying off this religiously Again talk to me about a vacation they had planned for mid August. They have a cancellation insurance and want to go ahead and put this in. I asked him to see if there is a specific form they want filled out. It does seem unlikely that he will be healed by that time [August 12]. Furthermore there are apparently no elevators they be going up stairs. 7/13; right fifth met head. Still not a viable surface using Hydrofera Blue and Santyl. Offloading in a forefoot offloading 7/20; right fifth metatarsal head. Surface looks better. Dimensions down by 4 x 3 mm. We will moved to straight [facility] with a total contact cast today His wife asked me to look at some erythema on the left anterior lower leg. It is indeed warm but not tender [however he is neuropathic]. He also has some degree of venous insufficiency I wonder some of this could be stasis dermatitis 7/22; patient presents for obligatory cast change. He reports not issues with the cast placed earlier this week. He tolerated this well. He has no complaints or questions today. 7/29; the patient has no complaints. The wound actually looks quite good rim of epithelialization nice healthy granulation. But only minimal improvement in size 8/4; patient presents for follow-up. He has no issues or complaints today. He has tolerated the cast well over the past week. He denies signs of  infection. Electronic Signature(s) Signed: 07/23/2021 4:02:34 PM By: Kalman Shan DO Entered By: Kalman Shan on 07/23/2021 16:00:21 -------------------------------------------------------------------------------- Physical Exam Details Patient Name: Date of Service: Caleb Alken RD C. 07/23/2021 2:00 PM Medical Record Number: [id number] Patient Account Number: [id number] Date of Birth/Sex: Treating RN: Apr 25, 1946 (75 y.o. [name] Primary Care Provider: Yaakov Guthrie Other Clinician: Referring Provider: Treating Provider/Extender: Leigh Aurora in Treatment: 6 Constitutional respirations regular, non-labored and within target range for patient.. Cardiovascular 2+ dorsalis pedis/posterior tibialis pulses. Psychiatric pleasant and cooperative. Notes Right foot: T the lateral aspect there is an open wound with nonviable tissue present. Post debridement there is granulation tissue. No signs of infection. o Electronic Signature(s) Signed: 07/23/2021 4:02:34 PM By: Kalman Shan DO Entered By: Kalman Shan on 07/23/2021 16:01:06 -------------------------------------------------------------------------------- Physician Orders Details Patient Name: Date of Service: Caleb Alken RD C. 07/23/2021 2:00 PM Medical Record Number: [id number] Patient Account Number: [id number] Date of Birth/Sex: Treating RN: November 28, 1946 (75 y.o. 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 Margarita Grizzle Tuesday Return A Bathing/ Shower/ Hygiene May shower with protection but do not get wound dressing(s) wet. Edema Control - Lymphedema / SCD / Other Elevate legs to the level of the heart or above for 30 minutes daily and/or when sitting, a frequency of: - throughout the day Avoid standing for long periods of time. Off-Loading Total Contact Cast to Right Lower Extremity Wound Treatment  Wound #4 - Foot Wound Laterality: Right, Lateral Cleanser: Soap and Water 1 x Per Week/7 Days Discharge Instructions: May shower and wash wound with dial antibacterial soap and water prior to dressing change. Cleanser: Wound Cleanser (Generic) 1 x Per Week/7 Days Discharge Instructions: Cleanse the wound with wound cleanser prior to applying a clean dressing using gauze sponges, not tissue or cotton balls. Prim Dressing: Hydrofera Blue Classic Foam, 2x2 in (Generic) 1 x Per Week/7 Days ary Discharge Instructions: Moisten with saline prior to applying to wound bed. Apply over Santyl. Secondary Dressing: Woven Gauze Sponge, Non-Sterile 4x4 in (Generic) 1 x Per Week/7 Days Discharge Instructions: Apply over primary dressing as directed. Secondary Dressing: Drawtex 4x4 in 1 x Per Week/7 Days Discharge Instructions: Apply over primary dressing as directed. Secured With: 33M Medipore H Soft Cloth Surgical Tape, 2x2 (in/yd) (Generic) 1 x Per Week/7 Days Discharge Instructions: Secure dressing with tape as directed. Electronic Signature(s) Signed: 07/23/2021 4:02:34 PM By: Kalman Shan DO Entered By: Kalman Shan on 07/23/2021 16:01:17 -------------------------------------------------------------------------------- Problem List Details Patient Name: Date of Service: Caleb Alken RD C. 07/23/2021 2:00 PM Medical Record Number: [id number] Patient Account Number: [id number] Date of Birth/Sex: Treating RN: Dec 07, 1946 (75 y.o. 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 06/11/2021 No Yes J44.9 Chronic obstructive pulmonary disease, unspecified 06/11/2021 No Yes G47.33 Obstructive sleep apnea (adult) (pediatric) 06/11/2021 No Yes Z86.718 Personal history of other venous thrombosis and embolism 06/11/2021 No Yes L03.116 Cellulitis of left lower limb 07/08/2021 No Yes Inactive Problems Resolved Problems Electronic Signature(s) Signed: 07/23/2021 4:02:34 PM By: Kalman Shan DO Entered By: Kalman Shan on 07/23/2021 15:59:34 -------------------------------------------------------------------------------- Progress Note Details Patient Name: Date of Service: Caleb Alken RD C. 07/23/2021 2:00 PM Medical Record Number: [id number] Patient Account Number: [id number] Date of Birth/Sex: Treating RN: [birth year] (75 y.o. [name] Primary Care Provider: Yaakov Guthrie Other Clinician: Referring Provider: Treating Provider/Extender: Leigh Aurora in Treatment: 6 Subjective Chief Complaint Information obtained from Patient Right foot wound History of Present Illness (HPI) Admission 6/23 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 He states this started a little over 2 months ago. This is now the third time he has developed a wound to this area. He followed with podiatry for this issue and has been using Betadine daily to it. He recently switched to silver alginate a week ago. He has been on doxycycline since May For this issue and just recently started a course  of Keflex prescribed in the ED on 6/18 For possible cellulitis. Currently denies signs of infection. He uses a front offloading shoe to help with relieving the pressure associated with the wound formation. He reports minimal drainage. He does not have sensation to his feet and denies any pain. Currently denies signs of infection. 6/29; this is a patient with chemotherapy-induced neuropathy. We had in clinic last year he was readmitted to our clinic last week. He has an area on the right fifth metatarsal head. Dr. Heber [place] admitted to the to the clinic last week started him on Santyl they are using this daily. The wound started in March she went to  podiatry. They are using Betadine to this I am not really sure about the benefit of Betadine in this type of situation. There is not appear to be any active infection. He is using a forefoot offloading boot 7/6; wound on the right fifth metatarsal head. Using Santyl daily. He is using a forefoot offloading boot but claims to be staying off this religiously Again talk to me about a vacation they had planned for mid August. They have a cancellation insurance and want to go ahead and put this in. I asked him to see if there is a specific form they want filled out. It does seem unlikely that he will be healed by that time [August 12]. Furthermore there are apparently no elevators they be going up stairs. 7/13; right fifth met head. Still not a viable surface using Hydrofera Blue and Santyl. Offloading in a forefoot offloading 7/20; right fifth metatarsal head. Surface looks better. Dimensions down by 4 x 3 mm. We will moved to straight [facility] with a total contact cast today His wife asked me to look at some erythema on the left anterior lower leg. It is indeed warm but not tender [however he is neuropathic]. He also has some degree of venous insufficiency I wonder some of this could be stasis dermatitis 7/22; patient presents for obligatory cast change.  He reports not issues with the cast placed earlier this week. He tolerated this well. He has no complaints or questions today. 7/29; the patient has no complaints. The wound actually looks quite good rim of epithelialization nice healthy granulation. But only minimal improvement in size 8/4; patient presents for follow-up. He has no issues or complaints today. He has tolerated the cast well over the past week. He denies signs of infection. Patient History Information obtained from Patient. Family History Cancer - Mother, Heart Disease - Father,Siblings, Hypertension - Mother,Father,Siblings, No family history of Diabetes, Hereditary Spherocytosis, Kidney Disease, Lung Disease, Seizures, Stroke, Thyroid Problems, Tuberculosis. Social History Former smoker - quit 2005, Marital Status - Married, Alcohol Use - Daily - bottle wine per day, Drug Use - No History, Caffeine Use - Daily - coffee. Medical History Eyes Patient has history of Glaucoma - mild Denies history of Cataracts, Optic Neuritis Respiratory Patient has history of Chronic Obstructive Pulmonary Disease (COPD), Sleep Apnea - no CPAP Cardiovascular Patient has history of Hypertension Neurologic Patient has history of Neuropathy Oncologic Patient has history of Received Chemotherapy, Received Radiation Psychiatric Denies history of Anorexia/bulimia, Confinement Anxiety Hospitalization/Surgery History - left inguinal hernia repair. - lobectomy right and left lung. - mouth cancer surgery. - tonsilectomy. 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 Vitals Time Taken: 2:14 PM, Height: 69 in, Weight: 210 lbs,  BMI: 31, Temperature: 98.1 F, Pulse: 101 bpm, Respiratory Rate: 18 breaths/min, Blood Pressure: 158/83 mmHg. Cardiovascular 2+ dorsalis pedis/posterior tibialis pulses. Psychiatric pleasant and cooperative. General Notes: Right foot: T the lateral aspect there is an open wound with nonviable tissue present. Post debridement there is granulation tissue. No signs of o infection. Integumentary (Hair, Skin) Wound #4 status is Open. Original cause of wound was Gradually Appeared. The date acquired was: 03/30/2021. The wound has been in treatment 6 weeks. The wound is located on the Right,Lateral Foot. The wound measures  0.7cm length x 1.5cm width x 0.1cm depth; 0.825cm^2 area and 0.082cm^3 volume. There is Fat Layer (Subcutaneous Tissue) exposed. There is no tunneling or undermining noted. There is a medium amount of serosanguineous drainage noted. The wound margin is distinct with the outline attached to the wound base. There is large (67-100%) pink, friable granulation within the wound bed. There is no necrotic tissue within the wound bed. 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 I debrided nonviable tissue. No signs of infection. I recommended continuing with Hydrofera Blue and the total contact cast. Procedures Wound #4 Pre-procedure diagnosis of Wound #4 is a Neuropathic Ulcer-Non Diabetic located on the Right,Lateral Foot . There was a Excisional Skin/Subcutaneous Tissue Debridement with a total area of 1.8 sq cm performed by Kalman Shan, DO. With the following instrument(s): Curette to remove Viable and Non-Viable tissue/material. Material removed includes  Callus, Subcutaneous Tissue, Slough, Skin: Dermis, Skin: Epidermis, and Fibrin/Exudate after achieving pain control using Lidocaine 4% T opical Solution. A time out was conducted at 15:20, prior to the start of the procedure. A Minimum amount of bleeding was controlled with Pressure. The procedure was tolerated well with a pain level of 0 throughout and a pain level of 0 following the procedure. Post Debridement Measurements: 0.7cm length x 1.5cm width x 0.2cm depth; 0.165cm^3 volume. Character of Wound/Ulcer Post Debridement requires further debridement. Post procedure Diagnosis Wound #4: Same as Pre-Procedure Pre-procedure diagnosis of Wound #4 is a Neuropathic Ulcer-Non Diabetic located on the Right,Lateral Foot . There was a T Programmer, multimedia Procedure by Cephas Darby, Coburn Knaus, DO. Post procedure Diagnosis Wound #4: Same as Pre-Procedure Plan Follow-up Appointments: Return Appointment in 1 week. Margarita Grizzle Tuesday Bathing/ Shower/ Hygiene: May shower with protection but do not get wound dressing(s) wet. Edema Control - Lymphedema / SCD / Other: Elevate legs to the level of the heart or above for 30 minutes daily and/or when sitting, a frequency of: - throughout the day Avoid standing for long periods of time. Off-Loading: T Contact Cast to Right Lower Extremity otal WOUND #4: - Foot Wound Laterality: Right, Lateral Cleanser: Soap and Water 1 x Per Week/7 Days Discharge Instructions: May shower and wash wound with dial antibacterial soap and water prior to dressing change. Cleanser: Wound Cleanser (Generic) 1 x Per Week/7 Days Discharge Instructions: Cleanse the wound with wound cleanser prior to applying a clean dressing using gauze sponges, not tissue or cotton balls. Prim Dressing: Hydrofera Blue Classic Foam, 2x2 in (Generic) 1 x Per Week/7 Days ary Discharge Instructions: Moisten with saline prior to applying to wound bed. Apply over Santyl. Secondary Dressing: Woven Gauze Sponge,  Non-Sterile 4x4 in (Generic) 1 x Per Week/7 Days Discharge Instructions: Apply over primary dressing as directed. Secondary Dressing: Drawtex 4x4 in 1 x Per Week/7 Days Discharge Instructions: Apply over primary dressing as directed. Secured With: 41M Medipore H Soft Cloth Surgical T ape, 2x2 (in/yd) (Generic) 1 x Per Week/7 Days Discharge Instructions: Secure dressing with tape as directed. 1. In office sharp debridement 2. Continue Hydrofera Blue under TCC 3. Follow-up next week Electronic Signature(s) Signed: 07/23/2021 4:02:34 PM By: Kalman Shan DO Entered By: Kalman Shan on 07/23/2021 16:02:06 -------------------------------------------------------------------------------- HxROS Details Patient Name: Date of Service: Caleb Alken RD C. 07/23/2021  2:00 PM Medical Record Number: [id number] Patient Account Number: [id number] Date of Birth/Sex: Treating RN: Apr 04, 1946 (75 y.o. 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 Gastrointestinal Medical History: Past Medical History Notes: ischemic colitis Musculoskeletal Medical History: Past Medical History Notes: hx polio as child Neurologic Medical History: Positive for: Neuropathy Oncologic Medical History: Positive for: Received Chemotherapy; Received Radiation Past Medical History Notes: hx lung CA right and left lung, hx mouth CA Psychiatric Medical  History: Negative for: Anorexia/bulimia; Confinement Anxiety HBO Extended History Items Eyes: Glaucoma Immunizations Pneumococcal Vaccine: Received Pneumococcal Vaccination: Yes Received Pneumococcal Vaccination On or After 60th Birthday: No Implantable Devices Yes Hospitalization / Surgery History Type of Hospitalization/Surgery left inguinal hernia repair lobectomy right and left lung mouth cancer surgery tonsilectomy Family and Social History Cancer: Yes - Mother; Diabetes: No; Heart Disease: Yes - Father,Siblings; Hereditary Spherocytosis: No; Hypertension: Yes - Mother,Father,Siblings; Kidney Disease: No; Lung Disease: No; Seizures: No; Stroke: No; Thyroid Problems: No; Tuberculosis: No; Former smoker - quit 2005; Marital Status - Married; Alcohol Use: Daily - bottle wine per day; Drug Use: No History; Caffeine Use: Daily - coffee; Financial Concerns: No; Food, Clothing or Shelter Needs: No; Support System Lacking: No; Transportation Concerns: No Electronic Signature(s) Signed: 07/23/2021 4:02:34 PM By: Kalman Shan DO Signed: 07/23/2021 6:14:36 PM By: Deon Pilling Entered By: Kalman Shan on 07/23/2021 16:00:33 -------------------------------------------------------------------------------- Total Contact Cast Details Patient Name: Date of Service: Caleb Alken RD C. 07/23/2021 2:00 PM Medical Record Number: [id number] Patient Account Number: [id number] Date of Birth/Sex: Treating RN: 1946-10-08 (75 y.o. [name] Primary Care Provider: Yaakov Guthrie Other Clinician: Referring Provider: Treating Provider/Extender: Leigh Aurora in Treatment: 6 T Contact Cast Applied for Wound Assessment: otal Wound #4 Right,Lateral Foot Performed By: Physician Kalman Shan, DO Post Procedure Diagnosis Same as Pre-procedure Electronic Signature(s) Signed: 07/23/2021 4:02:34 PM By: Kalman Shan DO Signed: 07/23/2021 6:14:36 PM By: Deon Pilling Entered By: Deon Pilling on 07/23/2021 15:30:06 -------------------------------------------------------------------------------- SuperBill Details Patient Name: Date of Service: Caleb Alken RD C. 07/23/2021 Medical Record Number: [id number] Patient Account Number: [id number] Date of Birth/Sex: Treating RN: 10/18/1946 (75 y.o. 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 J44.9 Chronic obstructive pulmonary disease, unspecified G47.33 Obstructive sleep apnea (adult) (pediatric) Z86.718 Personal history of other venous thrombosis and embolism L03.116 Cellulitis of left lower limb Facility Procedures CPT4 Code: [id number] Description: 79390 - DEB SUBQ TISSUE 20 SQ CM/< ICD-10 Diagnosis Description L97.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

## 2021-07-28 ENCOUNTER — Other Ambulatory Visit: Payer: Self-pay

## 2021-07-28 ENCOUNTER — Encounter (HOSPITAL_BASED_OUTPATIENT_CLINIC_OR_DEPARTMENT_OTHER): Payer: Medicare Other | Admitting: Physician Assistant

## 2021-07-28 DIAGNOSIS — L97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 with unspecified severity: Secondary | ICD-10-CM | POA: Diagnosis not present

## 2021-07-28 NOTE — Progress Notes (Signed)
ROXY, FILLER ([id number]) Visit Report for 07/28/2021 Arrival Information Details Patient Name: Date of Service: QUINN, QUAM [place] 07/28/2021 1:00 PM Medical Record Number: [id number] Patient Account Number: [id number] Date of Birth/Sex: Treating RN: 1946-08-13 (75 y.o. [name] Primary Care Jolleen Seman: Yaakov Guthrie Other Clinician: Referring Meliton Samad: Treating Angeni Chaudhuri/Extender: Derwood Kaplan in Treatment: 6 Visit Information History Since Last Visit Added or deleted any medications: No Patient Arrived: Ambulatory Any new allergies or adverse reactions: No Arrival Time: 13:05 Had a fall or experienced change in No Accompanied By: spouse activities of daily living that may affect Transfer Assistance: None risk of falls: Patient Identification Verified: Yes Signs or symptoms of abuse/neglect since last visito No Secondary Verification Process Completed: Yes Hospitalized since last visit: No Patient Requires Transmission-Based Precautions: No Implantable device outside of the clinic excluding No Patient Has Alerts: Yes cellular tissue based products placed in the center Patient Alerts: Patient on Blood Thinner since last visit: R ABI: 1.01 TBI: 0.77 Has Dressing in Place as Prescribed: Yes L ABI: 1.22 TBI: 0.87 Has Footwear/Offloading in Place as Prescribed: Yes 05/01/21 Right: T Contact Cast otal Pain Present Now: No Electronic Signature(s) Signed: 07/28/2021 5:39:27 PM By: Baruch Gouty RN, BSN Entered By: Baruch Gouty on 07/28/2021 13:07:31 -------------------------------------------------------------------------------- Encounter Discharge Information Details Patient Name: Date of Service: [name] 07/28/2021 1:00 PM Medical Record Number: [id number] Patient Account Number: [id number] Date of Birth/Sex: Treating RN: 07-04-1946 (75 y.o. Marcheta Grammes Primary Care Riata Ikeda: Yaakov Guthrie Other Clinician: Referring  Mazzy Santarelli: Treating Ronisha Herringshaw/Extender: Derwood Kaplan in Treatment: 6 Encounter Discharge Information Items Discharge Condition: Stable Ambulatory Status: Ambulatory Discharge Destination: Home Transportation: Private Auto Accompanied By: wife Schedule Follow-up Appointment: Yes Clinical Summary of Care: Provided on 07/28/2021 Form Type Recipient Paper Patient Patient Electronic Signature(s) Signed: 07/28/2021 2:41:49 PM By: Lorrin Jackson Entered By: Lorrin Jackson on 07/28/2021 14:41:49 -------------------------------------------------------------------------------- Lower Extremity Assessment Details Patient Name: Date of Service: [name] [place] 07/28/2021 1:00 PM Medical Record Number: [id number] Patient Account Number: [id number] Date of Birth/Sex: Treating RN: 22-May-1946 (75 y.o. [name] Primary Care Brady Plant: Yaakov Guthrie Other Clinician: Referring Cree Napoli: Treating Skylor Hughson/Extender: Tennis Must Weeks in Treatment: 6 Edema Assessment Assessed: [Left: No] [Right: No] Edema: [Left: N] [Right: o] Calf Left: Right: Point of Measurement: 34 cm From Medial Instep 39.5 cm Ankle Left: Right: Point of Measurement: 13 cm From Medial Instep 24 cm Vascular Assessment Pulses: Dorsalis Pedis Palpable: [Right:Yes] Electronic Signature(s) Signed: 07/28/2021 5:39:27 PM By: Baruch Gouty RN, BSN Entered By: Baruch Gouty on 07/28/2021 13:13:19 -------------------------------------------------------------------------------- [place] Details Patient Name: Date of Service: [name] 07/28/2021 1:00 PM Medical Record Number: [id number] Patient Account Number: [id number] Date of Birth/Sex: Treating RN: 02/01/1946 (75 y.o. [name], [name] Primary Care Gregg Holster: Yaakov Guthrie Other Clinician: Referring Daniella Dewberry: Treating Braeson Rupe/Extender: Derwood Kaplan in Treatment:  6 Active Inactive Wound/Skin Impairment Nursing Diagnoses: Impaired tissue integrity Goals: Patient/caregiver will verbalize understanding of skin care regimen Date Initiated: 06/11/2021 Date Inactivated: 07/15/2021 Target Resolution Date: 07/17/2021 Goal Status: Met Ulcer/skin breakdown will have a volume reduction of 30% by week 4 Date Initiated: 06/11/2021 Date Inactivated: 07/15/2021 Target Resolution Date: 07/17/2021 Goal Status: Met Ulcer/skin breakdown will have a volume reduction of 50% by week 8 Date Initiated: 07/15/2021 Target Resolution Date: 08/12/2021 Goal Status: Active Interventions: Assess patient/caregiver ability to obtain necessary supplies Assess patient/caregiver ability to perform ulcer/skin care regimen upon  admission and as needed Assess ulceration(s) every visit Notes: Electronic Signature(s) Signed: 07/28/2021 5:35:30 PM By: Rhae Hammock RN Entered By: Rhae Hammock on 07/28/2021 08:52:29 -------------------------------------------------------------------------------- Pain Assessment Details Patient Name: Date of Service: [name] [place] 07/28/2021 1:00 PM Medical Record Number: [id number] Patient Account Number: [id number] Date of Birth/Sex: Treating RN: [birth year] (75 y.o. [name] Primary Care Breaker Springer: Yaakov Guthrie Other Clinician: Referring Faiga Stones: Treating Pranika Finks/Extender: Derwood Kaplan in Treatment: 6 Active Problems Location of Pain Severity and Description of Pain Patient Has Paino No Site Locations Rate the pain. Current Pain Level: 0 Pain Management and Medication Current Pain Management: Electronic Signature(s) Signed: 07/28/2021 5:39:27 PM By: Baruch Gouty RN, BSN Entered By: Baruch Gouty on 07/28/2021 13:08:26 -------------------------------------------------------------------------------- Patient/Caregiver Education Details Patient Name: Date of Service: [name] RD [name]  8/9/2022andnbsp1:00 PM Medical Record Number: [id number] Patient Account Number: [id number] Date of Birth/Gender: Treating RN: [birth year] (75 y.o. [name] Primary Care Physician: Yaakov Guthrie Other Clinician: Referring Physician: Treating Physician/Extender: Derwood Kaplan in Treatment: 6 Education Assessment Education Provided To: Patient Education Topics Provided Wound/Skin Impairment: Methods: Explain/Verbal Responses: State content correctly Electronic Signature(s) Signed: 07/28/2021 5:35:30 PM By: Rhae Hammock RN Entered By: Rhae Hammock on 07/28/2021 08:52:39 -------------------------------------------------------------------------------- Wound Assessment Details Patient Name: Date of Service: [name] [place] 07/28/2021 1:00 PM Medical Record Number: [id number] Patient Account Number: [id number] Date of Birth/Sex: Treating RN: 1946-01-24 (75 y.o. [name], [name] Primary Care Ericka Marcellus: Yaakov Guthrie Other Clinician: Referring [employer]: Treating Apryll Hinkle/Extender: Tennis Must Weeks in Treatment: 6 Wound Status Wound Number: 4 Primary Neuropathic Ulcer-Non Diabetic Etiology: Wound Location: Right, Lateral Foot Wound Open Wounding Event: Gradually Appeared Status: Date Acquired: 03/30/2021 Comorbid Glaucoma, Chronic Obstructive Pulmonary Disease (COPD), Sleep Weeks Of Treatment: 6 History: Apnea, Hypertension, Neuropathy, Received Chemotherapy, Clustered Wound: No Received Radiation Photos Wound Measurements Length: (cm) 0.9 Width: (cm) 1.5 Depth: (cm) 0.1 Area: (cm) 1.06 Volume: (cm) 0.106 % Reduction in Area: 80.7% % Reduction in Volume: 80.7% Epithelialization: Small (1-33%) Tunneling: No Undermining: No Wound Description Classification: Full Thickness Without Exposed Support Structures Wound Margin: Distinct, outline attached Exudate Amount: Medium Exudate Type: Serosanguineous Exudate  Color: red, brown Wound Bed Granulation Amount: Large (67-100%) Granulation Quality: Red, Friable Necrotic Amount: None Present (0%) Foul Odor After Cleansing: No Slough/Fibrino Yes Exposed Structure Fascia Exposed: No Fat Layer (Subcutaneous Tissue) Exposed: Yes Tendon Exposed: No Muscle Exposed: No Joint Exposed: No Bone Exposed: No Treatment Notes Wound #4 (Foot) Wound Laterality: Right, Lateral Cleanser Soap and Water Discharge Instruction: May shower and wash wound with dial antibacterial soap and water prior to dressing change. Wound Cleanser Discharge Instruction: Cleanse the wound with wound cleanser prior to applying a clean dressing using gauze sponges, not tissue or cotton balls. Peri-Wound Care Topical Primary Dressing Hydrofera Blue Classic Foam, 2x2 in Discharge Instruction: Moisten with saline prior to applying to wound bed. Apply over Santyl. Secondary Dressing Woven Gauze Sponge, Non-Sterile 4x4 in Discharge Instruction: Apply over primary dressing as directed. Drawtex 4x4 in Discharge Instruction: Apply over primary dressing as directed. Secured With 69M [place] Surgical T ape, 2x2 (in/yd) Discharge Instruction: Secure dressing with tape as directed. Compression Wrap Compression Stockings Add-Ons Electronic Signature(s) Signed: 07/28/2021 5:35:30 PM By: Rhae Hammock RN Entered By: Rhae Hammock on 07/28/2021 14:08:38 -------------------------------------------------------------------------------- Vitals Details Patient Name: Date of Service: [name] 07/28/2021 1:00 PM Medical Record Number: [id number] Patient Account Number: [id number] Date of Birth/Sex:  Treating RN: September 20, 1946 (75 y.o. [name] Primary Care Sheronda Parran: Yaakov Guthrie Other Clinician: Referring Ciel Chervenak: Treating Stayce Delancy/Extender: Derwood Kaplan in Treatment: 6 Vital Signs Time Taken: 13:07 Temperature (F):  98.2 Height (in): 69 Pulse (bpm): 97 Source: Stated Respiratory Rate (breaths/min): 18 Weight (lbs): 210 Blood Pressure (mmHg): 141/83 Source: Stated Reference Range: 80 - 120 mg / dl Body Mass Index (BMI): 31 Electronic Signature(s) Signed: 07/28/2021 5:39:27 PM By: Baruch Gouty RN, BSN Entered By: Baruch Gouty on 07/28/2021 13:08:16

## 2021-07-28 NOTE — Progress Notes (Addendum)
[name] ([id number]) Visit Report for 07/28/2021 Chief Complaint Document Details Patient Name: Date of Service: [name], [name] 07/28/2021 1:00 PM Medical Record Number: [id number] Patient Account Number: [id number] Date of Birth/Sex: Treating RN: Oct 10, 1946 (75 y.o. [name], [name] Primary Care Provider: Yaakov Everett Other Clinician: Referring Provider: Treating Provider/Extender: [name] in Treatment: 6 Information Obtained from: Patient Chief Complaint Right foot wound Electronic Signature(s) Signed: 07/28/2021 1:00:25 PM By: Caleb Keeler PA-C Entered By: [name] on 07/28/2021 13:00:25 -------------------------------------------------------------------------------- HPI Details Patient Name: Date of Service: [name] 07/28/2021 1:00 PM Medical Record Number: [id number] Patient Account Number: [id number] Date of Birth/Sex: Treating RN: January 23, 1946 (75 y.o. 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 He states this started a little over 2 months ago. This is now the third time he has developed a wound to this area. He followed with podiatry for this issue and has been using Betadine daily to it. He recently switched to silver alginate a week ago. He has been on doxycycline since May For this issue and just recently started a course of Keflex prescribed in the ED on 6/18 For possible cellulitis. Currently denies signs of infection. He uses a front offloading shoe to help with relieving the pressure associated with the wound formation. He reports  minimal drainage. He does not have sensation to his feet and denies any pain. Currently denies signs of infection. 6/29; this is a patient with chemotherapy-induced neuropathy. We had in clinic last year he was readmitted to our clinic last week. He has an area on the right fifth metatarsal head. Dr. Heber Everett admitted to the to the clinic last week started him on Santyl they are using this daily. The wound started in March she went to podiatry. They are using Betadine to this I am not really sure about the benefit of Betadine in this type of situation. There is not appear to be any active infection. He is using a forefoot offloading boot 7/6; wound on the right fifth metatarsal head. Using Santyl daily. He is using a forefoot offloading boot but claims to be staying off this religiously Again talk to me about a vacation they had planned for mid August. They have a cancellation insurance and want to go ahead and put this in. I asked him to see if there is a specific form they want filled out. It does seem unlikely that he will be healed by that time [August 12]. Furthermore there are apparently no elevators they be going up stairs. 7/13; right fifth met head. Still not a viable surface using Hydrofera Blue and Santyl. Offloading in a forefoot offloading 7/20; right fifth metatarsal head. Surface looks better. Dimensions down by 4 x 3 mm. We will moved to straight [facility] with a total contact cast today His wife asked me to look at some erythema on the left anterior lower leg. It is indeed warm but not tender [however he is neuropathic]. He also has some degree of venous insufficiency I wonder some of this could be stasis dermatitis 7/22; patient presents for obligatory cast change. He reports not issues with the  cast placed earlier this week. He tolerated this well. He has no complaints or questions today. 7/29; the patient has no complaints. The wound actually looks quite good rim of  epithelialization nice healthy granulation. But only minimal improvement in size 8/4; patient presents for follow-up. He has no issues or complaints today. He has tolerated the cast well over the past week. He denies signs of infection. 8/5; patient was seen yesterday. He states that the cast was uncomfortable and rubbing at the top. He is here for evaluation of replacement. 07/28/2021 upon evaluation today patient presents for follow-up he was last seen on the fifth for his last measurement. Fortunately there is no signs of active infection at this time which is great news and overall very pleased with where we stand. With that being said from the starting point of where he is managing this is definitely showing signs of improvement which is great news there is a little bit of hypergranulation I think silver nitrate could be helpful today. Electronic Signature(s) Signed: 07/28/2021 2:12:35 PM By: Caleb Keeler PA-C Entered By: [name] on 07/28/2021 14:12:34 -------------------------------------------------------------------------------- Physical Exam Details Patient Name: Date of Service: [name] [place] 07/28/2021 1:00 PM Medical Record Number: [id number] Patient Account Number: [id number] Date of Birth/Sex: Treating RN: 30-Jan-1946 (76 y.o. [name] Primary Care Provider: Yaakov Everett Other Clinician: Referring Provider: Treating Provider/Extender: Tennis Must Weeks in Treatment: 6 Constitutional Well-nourished and well-hydrated in no acute distress. Respiratory normal breathing without difficulty. Psychiatric this patient is able to make decisions and demonstrates good insight into disease process. Alert and Oriented x 3. pleasant and cooperative. Notes I did perform chemical cauterization with silver nitrate over the hypergranular tissue/surface of the wound. Patient tolerated that today with no pain he has neuropathy. With regard to the overall  appearance of the wound I am very pleased I think the Hydrofera Blue is doing a great job and I think the cast is definitely doing well for him. Electronic Signature(s) Signed: 07/28/2021 2:13:17 PM By: Caleb Keeler PA-C Entered By: [name] on 07/28/2021 14:13:17 -------------------------------------------------------------------------------- Physician Orders Details Patient Name: Date of Service: [name] [place] 07/28/2021 1:00 PM Medical Record Number: [id number] Patient Account Number: [id number] Date of Birth/Sex: Treating RN: [birth year] (75 y.o. 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 - Tuesday Dr. Dellia Nims Return A Bathing/ Shower/ Hygiene May shower with protection but do not get wound dressing(s) wet. Edema Control - Lymphedema / SCD / Other Elevate legs to the level of the heart or above for 30 minutes daily and/or when sitting, a frequency of: - throughout the day Avoid standing for long periods of time. Off-Loading Total Contact Cast to Right Lower Extremity Wound Treatment Wound #4 - Foot Wound Laterality: Right, Lateral Cleanser: Soap and Water 1 x Per Week/7 Days Discharge Instructions: May shower and wash wound with dial antibacterial soap and water prior to dressing change. Cleanser: Wound Cleanser (Generic) 1 x Per Week/7 Days Discharge Instructions: Cleanse the wound with wound cleanser prior to applying a  clean dressing using gauze sponges, not tissue or cotton balls. Prim Dressing: Hydrofera Blue Classic Foam, 2x2 in (Generic) 1 x Per Week/7 Days ary Discharge Instructions: Moisten with saline prior to applying to wound bed. Apply over Santyl. Secondary Dressing: Woven Gauze Sponge, Non-Sterile 4x4 in (Generic) 1 x Per Week/7 Days Discharge Instructions: Apply over primary dressing as directed. Secondary Dressing: Drawtex 4x4 in 1 x Per Week/7 Days Discharge Instructions: Apply over primary dressing as directed. Secured With: 71M Medipore H Soft Cloth Surgical Tape, 2x2 (in/yd) (Generic) 1 x Per Week/7 Days Discharge Instructions: Secure dressing with tape as directed. Electronic Signature(s) Signed: 07/28/2021 5:35:30 PM By: Rhae Hammock RN Signed: 07/30/2021 5:47:57 PM By: Caleb Keeler PA-C Entered By: Rhae Hammock on 07/28/2021 14:07:24 -------------------------------------------------------------------------------- Problem List Details Patient Name: Date of Service: [name] [place] 07/28/2021 1:00 PM Medical Record Number: [id number] Patient Account Number: [id number] Date of Birth/Sex: Treating RN: 23-Oct-1946 (75 y.o. 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 06/11/2021 No Yes J44.9 Chronic obstructive pulmonary disease, unspecified 06/11/2021 No Yes G47.33 Obstructive sleep apnea (adult) (pediatric) 06/11/2021 No Yes Z86.718 Personal history of other venous thrombosis and embolism 06/11/2021 No Yes L03.116 Cellulitis of left lower limb 07/08/2021 No Yes Inactive Problems Resolved Problems Electronic Signature(s) Signed: 07/28/2021 1:00:13 PM By: Caleb Keeler PA-C Entered  By: [name] on 07/28/2021 13:00:13 -------------------------------------------------------------------------------- Progress Note Details Patient Name: Date of Service: [name] RD C. 07/28/2021 1:00 PM Medical Record Number: [id number] Patient Account Number: [id number] Date of Birth/Sex: Treating RN: 1946/07/25 (74 y.o. 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 He states this started a little over 2 months ago. This is now the third time he has developed a wound to this area. He followed with podiatry for this issue and has been using Betadine daily to it. He recently switched to silver alginate a week ago. He has been on doxycycline since May For this issue and just recently started a course of Keflex prescribed in the ED on 6/18 For possible cellulitis. Currently denies signs of infection. He uses a front offloading shoe to help with relieving the pressure associated with the wound formation. He reports minimal drainage. He does not have sensation to his feet and denies any pain. Currently denies signs of infection. 6/29; this is a patient with chemotherapy-induced neuropathy. We had in clinic last year he was readmitted to our clinic last week. He has an area on the right fifth metatarsal head. Dr. Heber [place] admitted to the to the clinic last week started him on Santyl they are using this daily. The wound started in March she went to podiatry. They are using Betadine to this I am not really sure about the benefit of Betadine in this  type of situation. There is not appear to be any active infection. He is using a forefoot offloading boot 7/6; wound on the  right fifth metatarsal head. Using Santyl daily. He is using a forefoot offloading boot but claims to be staying off this religiously Again talk to me about a vacation they had planned for mid August. They have a cancellation insurance and want to go ahead and put this in. I asked him to see if there is a specific form they want filled out. It does seem unlikely that he will be healed by that time [August 12]. Furthermore there are apparently no elevators they be going up stairs. 7/13; right fifth met head. Still not a viable surface using Hydrofera Blue and Santyl. Offloading in a forefoot offloading 7/20; right fifth metatarsal head. Surface looks better. Dimensions down by 4 x 3 mm. We will moved to straight [facility] with a total contact cast today His wife asked me to look at some erythema on the left anterior lower leg. It is indeed warm but not tender [however he is neuropathic]. He also has some degree of venous insufficiency I wonder some of this could be stasis dermatitis 7/22; patient presents for obligatory cast change. He reports not issues with the cast placed earlier this week. He tolerated this well. He has no complaints or questions today. 7/29; the patient has no complaints. The wound actually looks quite good rim of epithelialization nice healthy granulation. But only minimal improvement in size 8/4; patient presents for follow-up. He has no issues or complaints today. He has tolerated the cast well over the past week. He denies signs of infection. 8/5; patient was seen yesterday. He states that the cast was uncomfortable and rubbing at the top. He is here for evaluation of replacement. 07/28/2021 upon evaluation today patient presents for follow-up he was last seen on the fifth for his last measurement. Fortunately there is no signs of active infection at  this time which is great news and overall very pleased with where we stand. With that being said from the starting point of where he is managing this is definitely showing signs of improvement which is great news there is a little bit of hypergranulation I think silver nitrate could be helpful today. Objective Constitutional Well-nourished and well-hydrated in no acute distress. Vitals Time Taken: 1:07 PM, Height: 69 in, Source: Stated, Weight: 210 lbs, Source: Stated, BMI: 31, Temperature: 98.2 F, Pulse: 97 bpm, Respiratory Rate: 18 breaths/min, Blood Pressure: 141/83 mmHg. Respiratory normal breathing without difficulty. Psychiatric this patient is able to make decisions and demonstrates good insight into disease process. Alert and Oriented x 3. pleasant and cooperative. General Notes: I did perform chemical cauterization with silver nitrate over the hypergranular tissue/surface of the wound. Patient tolerated that today with no pain he has neuropathy. With regard to the overall appearance of the wound I am very pleased I think the Hydrofera Blue is doing a great job and I think the cast is definitely doing well for him. Integumentary (Hair, Skin) Wound #4 status is Open. Original cause of wound was Gradually Appeared. The date acquired was: 03/30/2021. The wound has been in treatment 6 weeks. The wound is located on the Right,Lateral Foot. The wound measures 0.9cm length x 1.5cm width x 0.1cm depth; 1.06cm^2 area and 0.106cm^3 volume. There is Fat Layer (Subcutaneous Tissue) exposed. There is no tunneling or undermining noted. There is a medium amount of serosanguineous drainage noted. The wound margin is distinct with the outline attached to the wound base. There is large (67-100%) red, friable granulation within the wound bed. There is no necrotic tissue within  the wound bed. Assessment Active Problems ICD-10 Non-pressure chronic ulcer of other part of right foot with unspecified  severity Chronic obstructive pulmonary disease, unspecified Obstructive sleep apnea (adult) (pediatric) Personal history of other venous thrombosis and embolism Cellulitis of left lower limb Procedures Wound #4 Pre-procedure diagnosis of Wound #4 is a Neuropathic Ulcer-Non Diabetic located on the Right,Lateral Foot . There was a T Contact Cast Procedure by Selena Batten, PA. Post procedure Diagnosis Wound #4: Same as Pre-Procedure Plan Follow-up Appointments: Return Appointment in 1 week. - Tuesday Dr. Dellia Nims Bathing/ Shower/ Hygiene: May shower with protection but do not get wound dressing(s) wet. Edema Control - Lymphedema / SCD / Other: Elevate legs to the level of the heart or above for 30 minutes daily and/or when sitting, a frequency of: - throughout the day Avoid standing for long periods of time. Off-Loading: T Contact Cast to Right Lower Extremity otal WOUND #4: - Foot Wound Laterality: Right, Lateral Cleanser: Soap and Water 1 x Per Week/7 Days Discharge Instructions: May shower and wash wound with dial antibacterial soap and water prior to dressing change. Cleanser: Wound Cleanser (Generic) 1 x Per Week/7 Days Discharge Instructions: Cleanse the wound with wound cleanser prior to applying a clean dressing using gauze sponges, not tissue or cotton balls. Prim Dressing: Hydrofera Blue Classic Foam, 2x2 in (Generic) 1 x Per Week/7 Days ary Discharge Instructions: Moisten with saline prior to applying to wound bed. Apply over Santyl. Secondary Dressing: Woven Gauze Sponge, Non-Sterile 4x4 in (Generic) 1 x Per Week/7 Days Discharge Instructions: Apply over primary dressing as directed. Secondary Dressing: Drawtex 4x4 in 1 x Per Week/7 Days Discharge Instructions: Apply over primary dressing as directed. Secured With: 84M Medipore H Soft Cloth Surgical T ape, 2x2 (in/yd) (Generic) 1 x Per Week/7 Days Discharge Instructions: Secure dressing with tape as directed. 1.  Would recommend currently that we go ahead and continue with the [facility] as that seems to be doing great. 2. I am also can recommend the total contact cast be continued. I did actually perform chemical cauterization of hypergranulation tissue to help today with the overgrowth. I think that will help improve things as far as healing is concerned. We will see patient back for reevaluation in 1 week here in the clinic. If anything worsens or changes patient will contact our office for additional recommendations. Electronic Signature(s) Signed: 07/28/2021 2:13:39 PM By: Caleb Keeler PA-C Entered By: [name] on 07/28/2021 14:13:39 -------------------------------------------------------------------------------- Total Contact Cast Details Patient Name: Date of Service: [name], [name] [place] 07/28/2021 1:00 PM Medical Record Number: [id number] Patient Account Number: [id number] Date of Birth/Sex: Treating RN: Jul 08, 1946 (75 y.o. [name], [name] Primary Care Provider: Yaakov Everett Other Clinician: Referring Provider: Treating Provider/Extender: [name] in Treatment: 6 T Contact Cast Applied for Wound Assessment: otal Wound #4 Right,Lateral Foot Performed By: Physician Caleb Keeler, PA Post Procedure Diagnosis Same as Pre-procedure Electronic Signature(s) Signed: 07/28/2021 5:35:30 PM By: Rhae Hammock RN Signed: 07/30/2021 5:47:57 PM By: Caleb Keeler PA-C Entered By: Rhae Hammock on 07/28/2021 14:04:50 -------------------------------------------------------------------------------- SuperBill Details Patient Name: Date of Service: [name] RD C. 07/28/2021 Medical Record Number: [id number] Patient Account Number: [id number] Date of Birth/Sex: Treating RN: 04-03-1946 (75 y.o. [name] Primary Care Provider: Yaakov Everett Other Clinician: Referring Provider: Treating Provider/Extender: [name] in  Treatment: 6 Diagnosis Coding ICD-10 Codes Code Description [phone] Non-pressure chronic ulcer of other part of right  foot with unspecified severity J44.9 Chronic obstructive pulmonary disease, unspecified G47.33 Obstructive sleep apnea (adult) (pediatric) Z86.718 Personal history of other venous thrombosis and embolism L03.116 Cellulitis of left lower limb Facility Procedures CPT4 Code: [id number] Description: [phone] - APPLY TOTAL CONTACT LEG CAST ICD-10 Diagnosis Description L03.116 Cellulitis of left lower limb Modifier: Quantity: 1 Physician Procedures : CPT4 Code Description Modifier [id number] - WC PHYS APPLY TOTAL CONTACT CAST 1 ICD-10 Diagnosis Description L03.116 Cellulitis of left lower limb Quantity: Electronic Signature(s) Signed: 07/28/2021 2:13:49 PM By: Caleb Keeler PA-C Entered By: [name] on 07/28/2021 14:13:48

## 2021-07-29 ENCOUNTER — Ambulatory Visit (INDEPENDENT_AMBULATORY_CARE_PROVIDER_SITE_OTHER): Payer: Medicare Other | Admitting: Podiatry

## 2021-07-29 ENCOUNTER — Encounter: Payer: Self-pay | Admitting: Podiatry

## 2021-07-29 DIAGNOSIS — M216X1 Other acquired deformities of right foot: Secondary | ICD-10-CM

## 2021-07-29 DIAGNOSIS — L97512 Non-pressure chronic ulcer of other part of right foot with fat layer exposed: Secondary | ICD-10-CM

## 2021-07-29 DIAGNOSIS — M79674 Pain in right toe(s): Secondary | ICD-10-CM

## 2021-07-29 DIAGNOSIS — D689 Coagulation defect, unspecified: Secondary | ICD-10-CM | POA: Diagnosis not present

## 2021-07-29 NOTE — Progress Notes (Addendum)
This patient returns to my office for at risk foot care.  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  The ulcer right foot has resolved.  This patient is unable to cut nails himself since the patient cannot reach his nails.These nails are painful walking and wearing shoes.  This patient presents for at risk foot care today.  Patient has developed and is being treated by Dr.  Posey Pronto for open wound right foot.  Patient presents with a bandage on his right foot and with his wife.  General Appearance  Alert, conversant and in no acute stress.(Previous findings)  Vascular  Dorsalis pedis and posterior tibial  pulses are palpable  bilaterally.  Capillary return is within normal limits  bilaterally. Temperature is within normal limits  bilaterally.  Neurologic  Senn-Weinstein monofilament wire test within normal limits  bilaterally. Muscle power within normal limits bilaterally.  Nails Thick disfigured discolored nails with subungual debris  Hallux nails  bilaterally. No evidence of bacterial infection or drainage bilaterally.  Orthopedic  No limitations of motion  feet .  No crepitus or effusions noted.  No bony pathology or digital deformities noted.  Tailors bunion right foot  Skin  normotropic skin with no porokeratosis noted bilaterally.  No signs of infections or ulcers noted.   Ulcer with bandage covering sub 5th met right foot in a cam walker.  Onychomycosis   Pain in left toes  Ulcer right foot.  Consent was obtained for treatment procedures.   Mechanical debridement of nails 1-5  bilaterally performed with a nail nipper.  Filed with dremel without incident.  Discussed possible surgery with this patient.  To discuss this with his wound care doctor.   Return office visit    10 weeks                 Told patient to return for periodic foot care and evaluation due to potential at risk  complications.   Gardiner Barefoot DPM

## 2021-08-05 ENCOUNTER — Encounter (HOSPITAL_BASED_OUTPATIENT_CLINIC_OR_DEPARTMENT_OTHER): Payer: Medicare Other | Admitting: Internal Medicine

## 2021-08-05 ENCOUNTER — Other Ambulatory Visit: Payer: Self-pay

## 2021-08-05 DIAGNOSIS — L97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 with unspecified severity: Secondary | ICD-10-CM | POA: Diagnosis not present

## 2021-08-05 NOTE — Progress Notes (Signed)
GARRICK, MIDGLEY ([id number]) Visit Report for 08/05/2021 Arrival Information Details Patient Name: Date of Service: AKSHAY, SPANG RD C. 08/05/2021 10:45 A M Medical Record Number: [id number] Patient Account Number: [id number] Date of Birth/Sex: Treating RN: 27-Jul-1946 (75 y.o. [name] Primary Care Keyairra Kolinski: Yaakov Guthrie Other Clinician: Referring Alex Mcmanigal: Treating Vera Wishart/Extender: [name] in Treatment: 7 Visit Information History Since Last Visit Added or deleted any medications: No Patient Arrived: Ambulatory Any new allergies or adverse reactions: No Arrival Time: 11:14 Had a fall or experienced change in No Accompanied By: spouse activities of daily living that may affect Transfer Assistance: None risk of falls: Patient Identification Verified: Yes Signs or symptoms of abuse/neglect since last visito No Secondary Verification Process Completed: Yes Hospitalized since last visit: No Patient Requires Transmission-Based Precautions: No Implantable device outside of the clinic excluding No Patient Has Alerts: Yes cellular tissue based products placed in the center Patient Alerts: Patient on Blood Thinner since last visit: R ABI: 1.01 TBI: 0.77 Has Dressing in Place as Prescribed: Yes L ABI: 1.22 TBI: 0.87 Has Footwear/Offloading in Place as Prescribed: Yes 05/01/21 Right: T Contact Cast otal Pain Present Now: No Electronic Signature(s) Signed: 08/05/2021 6:11:36 PM By: Levan Hurst RN, BSN Entered By: Levan Hurst on 08/05/2021 11:14:49 -------------------------------------------------------------------------------- Encounter Discharge Information Details Patient Name: Date of Service: [name] 08/05/2021 10:45 A M Medical Record Number: [id number] Patient Account Number: [id number] Date of Birth/Sex: Treating RN: 06-25-1946 (75 y.o. [name] Primary Care Cheresa Siers: Yaakov Guthrie Other Clinician: Referring  Sahil Milner: Treating Vonette Grosso/Extender: [name] in Treatment: 7 Encounter Discharge Information Items Discharge Condition: Stable Ambulatory Status: Ambulatory Discharge Destination: Home Transportation: Private Auto Accompanied By: spouse Schedule Follow-up Appointment: Yes Clinical Summary of Care: Patient Declined Electronic Signature(s) Signed: 08/05/2021 6:11:36 PM By: Levan Hurst RN, BSN Entered By: Levan Hurst on 08/05/2021 17:53:56 -------------------------------------------------------------------------------- Lower Extremity Assessment Details Patient Name: Date of Service: [name] 08/05/2021 10:45 A M Medical Record Number: [id number] Patient Account Number: [id number] Date of Birth/Sex: Treating RN: 1946/12/19 (75 y.o. [name] Primary Care Evelio Rueda: Yaakov Guthrie Other Clinician: Referring Lasha Echeverria: Treating Jakai Risse/Extender: [name] in Treatment: 7 Edema Assessment Assessed: Shirlyn Goltz: No] Patrice Paradise: No] Edema: [Left: N] [Right: o] Calf Left: Right: Point of Measurement: 34 cm From Medial Instep 40 cm Ankle Left: Right: Point of Measurement: 13 cm From Medial Instep 23 cm Vascular Assessment Pulses: Dorsalis Pedis Palpable: [Right:Yes] Electronic Signature(s) Signed: 08/05/2021 6:11:36 PM By: Levan Hurst RN, BSN Entered By: Levan Hurst on 08/05/2021 11:27:47 -------------------------------------------------------------------------------- Multi Wound Chart Details Patient Name: Date of Service: [name] 08/05/2021 10:45 A M Medical Record Number: [id number] Patient Account Number: [id number] Date of Birth/Sex: Treating RN: [birth year] (75 y.o. 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] [N/A:N/A] Primary Etiology: [4:Glaucoma, Chronic Obstructive] [N/A:N/A] Comorbid History: [4:Pulmonary Disease (COPD), Sleep Apnea, Hypertension, Neuropathy, Received Chemotherapy, Received Radiation 03/30/2021] [N/A:N/A] Date Acquired: [4:7] [N/A:N/A] Weeks of Treatment: [4:Open] [N/A:N/A] Wound Status: [4:0.6x1.1x0.1] [N/A:N/A] Measurements L x W x D (cm) [4:0.518] [N/A:N/A] A (cm) : rea [4:0.052] [N/A:N/A] Volume (cm) : [4:90.60%] [N/A:N/A] % Reduction in Area: [4:90.50%] [N/A:N/A] %  Reduction in Volume: [4:Full Thickness Without Exposed] [N/A:N/A] Classification: [4:Support Structures Medium] [N/A:N/A] Exudate Amount: [4:Serosanguineous] [N/A:N/A] Exudate Type: [4:red, brown] [N/A:N/A] Exudate Color: [4:Distinct, outline attached] [N/A:N/A] Wound Margin: [4:Large (67-100%)] [N/A:N/A] Granulation Amount: [4:Red, Friable] [N/A:N/A] Granulation Quality: [4:None Present (0%)] [N/A:N/A] Necrotic Amount: [4:Fat Layer (Subcutaneous Tissue): Yes N/A] Exposed Structures: [4:Fascia: No Tendon: No Muscle: No Joint: No Bone: No Small (1-33%)] [N/A:N/A] Epithelialization: [4:Chemical Cauterization] [N/A:N/A] Treatment Notes Electronic Signature(s) Signed: 08/05/2021 5:36:21 PM By: Linton Ham MD Signed: 08/05/2021 6:11:36 PM By: Levan Hurst RN, BSN Entered By: Linton Ham on 08/05/2021 12:11:03 -------------------------------------------------------------------------------- Multi-Disciplinary Care Plan Details Patient Name: Date of Service: [name] 08/05/2021 10:45 A M Medical Record Number: [id number] Patient Account Number: [id number] Date of Birth/Sex: Treating RN: 1946-01-03 (75  y.o. [name] Primary Care Schwanda Zima: Yaakov Guthrie Other Clinician: Referring Maty Zeisler: Treating Peggy Loge/Extender: [name] in Treatment: 7 Active Inactive Wound/Skin Impairment Nursing Diagnoses: Impaired tissue integrity Goals: Patient/caregiver will verbalize understanding of skin care regimen Date Initiated: 06/11/2021 Date Inactivated: 07/15/2021 Target Resolution Date: 07/17/2021 Goal Status: Met Ulcer/skin breakdown will have a volume reduction of 30% by week 4 Date Initiated: 06/11/2021 Date Inactivated: 07/15/2021 Target Resolution Date: 07/17/2021 Goal Status: Met Ulcer/skin breakdown will have a volume reduction of 50% by week 8 Date Initiated: 07/15/2021 Target Resolution Date: 08/14/2021 Goal Status: Active Interventions: Assess patient/caregiver ability to obtain necessary supplies Assess patient/caregiver ability to perform ulcer/skin care regimen upon admission and as needed Assess ulceration(s) every visit Notes: Electronic Signature(s) Signed: 08/05/2021 6:11:36 PM By: Levan Hurst RN, BSN Entered By: Levan Hurst on 08/05/2021 11:32:16 -------------------------------------------------------------------------------- Pain Assessment Details Patient Name: Date of Service: [name] 08/05/2021 10:45 A M Medical Record Number: [id number] Patient Account Number: [id number] Date of Birth/Sex: Treating RN: 03/22/1946 (75 y.o. [name] Primary Care Alvin Diffee: Yaakov Guthrie Other Clinician: Referring Marquisha Nikolov: Treating Adna Nofziger/Extender: [name] in Treatment: 7 Active Problems Location of Pain Severity and Description of Pain Patient Has Paino No Site Locations Pain Management and Medication Current Pain Management: Electronic Signature(s) Signed: 08/05/2021 6:11:36 PM By: Levan Hurst RN, BSN Entered By: Levan Hurst on 08/05/2021  11:27:33 -------------------------------------------------------------------------------- Patient/Caregiver Education Details Patient Name: Date of Service: [name] [place]. 8/17/2022andnbsp10:45 A M Medical Record Number: [id number] Patient Account Number: [id number] Date of Birth/Gender: Treating RN: August 29, 1946 (75 y.o. [name] Primary Care Physician: Yaakov Guthrie Other Clinician: Referring Physician: Treating Physician/Extender: [name] in Treatment: 7 Education Assessment Education Provided To: Patient Education Topics Provided Wound/Skin Impairment: Methods: Explain/Verbal Responses: State content correctly Electronic Signature(s) Signed: 08/05/2021 6:11:36 PM By: Levan Hurst RN, BSN Entered By: Levan Hurst on 08/05/2021 11:32:27 -------------------------------------------------------------------------------- Wound Assessment Details Patient Name: Date of Service: [name] 08/05/2021 10:45 A M Medical Record Number: [id number] Patient Account Number: [id number] Date of Birth/Sex: Treating RN: 1946-04-12 (75 y.o. [name] Primary Care Alyx Gee: Yaakov Guthrie Other Clinician: Referring Nazier Neyhart: Treating Ruchama Kubicek/Extender: [name] in Treatment: 7 Wound Status Wound Number: 4 Primary Neuropathic Ulcer-Non Diabetic Etiology: Wound Location: Right, Lateral Foot Wound Open Wounding Event: Gradually Appeared Status: Date Acquired: 03/30/2021 Comorbid Glaucoma, Chronic Obstructive Pulmonary Disease (COPD), Sleep Weeks Of Treatment: 7 History: Apnea, Hypertension, Neuropathy, Received Chemotherapy, Clustered Wound: No Received Radiation Wound Measurements Length: (cm) 0.6 Width: (cm) 1.1 Depth: (cm) 0.1 Area: (cm) 0.518 Volume: (cm) 0.052 % Reduction in Area: 90.6% % Reduction in Volume: 90.5% Epithelialization: Small (1-33%) Tunneling: No Undermining:  No Wound  Description Classification: Full Thickness Without Exposed Support Structures Wound Margin: Distinct, outline attached Exudate Amount: Medium Exudate Type: Serosanguineous Exudate Color: red, brown Foul Odor After Cleansing: No Slough/Fibrino No Wound Bed Granulation Amount: Large (67-100%) Exposed Structure Granulation Quality: Red, Friable Fascia Exposed: No Necrotic Amount: None Present (0%) Fat Layer (Subcutaneous Tissue) Exposed: Yes Tendon Exposed: No Muscle Exposed: No Joint Exposed: No Bone Exposed: No Treatment Notes Wound #4 (Foot) Wound Laterality: Right, Lateral Cleanser Soap and Water Discharge Instruction: May shower and wash wound with dial antibacterial soap and water prior to dressing change. Wound Cleanser Discharge Instruction: Cleanse the wound with wound cleanser prior to applying a clean dressing using gauze sponges, not tissue or cotton balls. Peri-Wound Care Topical Primary Dressing Hydrofera Blue Classic Foam, 2x2 in Discharge Instruction: Moisten with saline prior to applying to wound bed. Apply over Santyl. Secondary Dressing Woven Gauze Sponge, Non-Sterile 4x4 in Discharge Instruction: Apply over primary dressing as directed. Drawtex 4x4 in Discharge Instruction: Apply over primary dressing as directed. Secured With 29M [place] [facility] T ape, 2x2 (in/yd) Discharge Instruction: Secure dressing with tape as directed. Compression Wrap Compression Stockings Add-Ons Electronic Signature(s) Signed: 08/05/2021 6:11:36 PM By: Levan Hurst RN, BSN Entered By: Levan Hurst on 08/05/2021 11:28:17 -------------------------------------------------------------------------------- Vitals Details Patient Name: Date of Service: [name] 08/05/2021 10:45 A M Medical Record Number: [id number] Patient Account Number: [id number] Date of Birth/Sex: Treating RN: Jul 31, 1946 (75 y.o. [name] Primary Care Turki Tapanes: Yaakov Guthrie Other Clinician: Referring Lilyana Lippman: Treating Tanique Matney/Extender: [name] in Treatment: 7 Vital Signs Time Taken: 11:14 Temperature (F): 98.6 Height (in): 69 Pulse (bpm): 85 Weight (lbs): 210 Respiratory Rate (breaths/min): 18 Body Mass Index (BMI): 31 Blood Pressure (mmHg): 150/79 Reference Range: 80 - 120 mg / dl Electronic Signature(s) Signed: 08/05/2021 6:11:36 PM By: Levan Hurst RN, BSN Entered By: Levan Hurst on 08/05/2021 11:15:11

## 2021-08-05 NOTE — Progress Notes (Signed)
Caleb, Everett ([id number]) Visit Report for 08/05/2021 HPI Details Patient Name: Date of Service: AVIS, [facility]MAH[place] RD C. 08/05/2021 10:45 A M Medical Record Number: [id number] Patient Account Number: [id number] Date of Birth/Sex: Treating RN: 03-03-1946 (75 y.o. [name] Primary Care Provider: Yaakov Guthrie Other Clinician: Referring Provider: Treating Provider/Extender: [name] in Treatment: 7 History of Present Illness HPI Description: Admission 6/23 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 He states this started a little over 2 months ago. This is now the third time he has developed a wound to this area. He followed with podiatry for this issue and has been using Betadine daily to it. He recently switched to silver alginate a week ago. He has been on doxycycline since May For this issue and just recently started a course of Keflex prescribed in the ED on 6/18 For possible cellulitis. Currently denies signs of infection. He uses a front offloading shoe to help with relieving the pressure associated with the wound formation. He reports minimal drainage. He does not have sensation to his feet and denies any pain. Currently denies signs of infection. 6/29; this is a patient with chemotherapy-induced neuropathy. We had in clinic last year he was readmitted to our clinic last week. He has an area on the right fifth metatarsal head. Dr. Heber [place] admitted to the to the clinic last week started him on Santyl they are using this daily. The wound started in March she went to podiatry. They are using Betadine to this I am not really sure about the benefit of Betadine in this type of situation. There is not appear to be any active infection. He is using a forefoot offloading boot 7/6; wound on the right fifth metatarsal  head. Using Santyl daily. He is using a forefoot offloading boot but claims to be staying off this religiously Again talk to me about a vacation they had planned for mid August. They have a cancellation insurance and want to go ahead and put this in. I asked him to see if there is a specific form they want filled out. It does seem unlikely that he will be healed by that time [August 12]. Furthermore there are apparently no elevators they be going up stairs. 7/13; right fifth met head. Still not a viable surface using Hydrofera Blue and Santyl. Offloading in a forefoot offloading 7/20; right fifth metatarsal head. Surface looks better. Dimensions down by 4 x 3 mm. We will moved to straight [facility] with a total contact cast today His wife asked me to look at some erythema on the left anterior lower leg. It is indeed warm but not tender [however he is neuropathic]. He also has some degree of venous insufficiency I wonder some of this could be stasis dermatitis 7/22; patient presents for obligatory cast change. He reports not issues with the cast placed earlier this week. He tolerated this well. He has no complaints or questions today. 7/29; the patient has no complaints. The wound actually looks quite good rim of epithelialization nice healthy granulation. But only minimal improvement in size 8/4; patient presents for follow-up. He has no issues or complaints today. He has tolerated the cast well over the past week. He denies signs of infection. 8/5; patient was seen yesterday. He states that the cast was uncomfortable and rubbing at the top.  He is here for evaluation of replacement. 07/28/2021 upon evaluation today patient presents for follow-up he was last seen on the fifth for his last measurement. Fortunately there is no signs of active infection at this time which is great news and overall very pleased with where we stand. With that being said from the starting point of where he is  managing this is definitely showing signs of improvement which is great news there is a little bit of hypergranulation I think silver nitrate could be helpful today. 8/17; wound is contracting nicely on the right fifth metatarsal head. Using [facility] and a total contact cast this is his second go round with this type of wound in this area. We previously had him in this clinic and heal them out lasting about 9 months. He apparently saw Dr. Posey Pronto and Dr. Paulla Dolly for routine and prophylactic nail care. They mention there might be a surgical option for his subluxing metatarsal head in this area Electronic Signature(s) Signed: 08/05/2021 5:36:21 PM By: Linton Ham MD Entered By: Linton Ham on 08/05/2021 12:15:43 -------------------------------------------------------------------------------- Chemical Cauterization Details Patient Name: Date of Service: [name] RD C. 08/05/2021 10:45 A M Medical Record Number: [id number] Patient Account Number: [id number] Date of Birth/Sex: Treating RN: 10/01/1946 (75 y.o. [name] Primary Care Provider: Yaakov Guthrie Other Clinician: Referring Provider: Treating Provider/Extender: [name] in Treatment: 7 Procedure Performed for: Wound #4 Right,Lateral Foot Performed By: Physician Ricard Dillon., MD Post Procedure Diagnosis Same as Pre-procedure Notes silver nitrate Electronic Signature(s) Signed: 08/05/2021 5:36:21 PM By: Linton Ham MD Signed: 08/05/2021 6:11:36 PM By: Levan Hurst RN, BSN Entered By: Levan Hurst on 08/05/2021 11:30:58 -------------------------------------------------------------------------------- Physical Exam Details Patient Name: Date of Service: [name] RD C. 08/05/2021 10:45 A M Medical Record Number: [id number] Patient Account Number: [id number] Date of Birth/Sex: Treating RN: 1946-05-08 (75 y.o. [name] Primary Care Provider: Yaakov Guthrie Other  Clinician: Referring Provider: Treating Provider/Extender: [name] in Treatment: 7 Constitutional Patient is hypertensive.. Pulse regular and within target range for patient.Marland Kitchen Respirations regular, non-labored and within target range.. Temperature is normal and within the target range for the patient.Marland Kitchen Appears in no distress. Notes Wound exam; the wound is smaller and the granulation looks healthy however it is a bit friable and a bit hyper granulated. I used a chemical cauterization with a single silver nitrate to try and get the surface to be more conducive for epithelialization. There is no evidence of infection Electronic Signature(s) Signed: 08/05/2021 5:36:21 PM By: Linton Ham MD Entered By: Linton Ham on 08/05/2021 12:16:45 -------------------------------------------------------------------------------- Physician Orders Details Patient Name: Date of Service: [name] RD C. 08/05/2021 10:45 A M Medical Record Number: [id number] Patient Account Number: [id number] Date of Birth/Sex: Treating RN: 03-06-1946 (75 y.o. 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 - with Dr. Dellia Nims Return A Bathing/ Shower/ Hygiene May shower with protection but do not get wound dressing(s) wet. Edema Control - Lymphedema / SCD / Other Elevate legs to the level of the heart or above for 30 minutes daily  and/or when  sitting, a frequency of: - throughout the day Avoid standing for long periods of time. Off-Loading Total Contact Cast to Right Lower Extremity Wound Treatment Wound #4 - Foot Wound Laterality: Right, Lateral Cleanser: Soap and Water 1 x Per Week/7 Days Discharge Instructions: May shower and wash wound with dial antibacterial soap and water prior to dressing change. Cleanser: Wound Cleanser (Generic) 1 x Per Week/7 Days Discharge Instructions: Cleanse the wound with wound cleanser prior to applying a clean dressing using gauze sponges, not tissue or cotton balls. Prim Dressing: Hydrofera Blue Classic Foam, 2x2 in (Generic) 1 x Per Week/7 Days ary Discharge Instructions: Moisten with saline prior to applying to wound bed. Apply over Santyl. Secondary Dressing: Woven Gauze Sponge, Non-Sterile 4x4 in (Generic) 1 x Per Week/7 Days Discharge Instructions: Apply over primary dressing as directed. Secondary Dressing: Drawtex 4x4 in 1 x Per Week/7 Days Discharge Instructions: Apply over primary dressing as directed. Secured With: 70M Medipore H Soft Cloth Surgical Tape, 2x2 (in/yd) (Generic) 1 x Per Week/7 Days Discharge Instructions: Secure dressing with tape as directed. Electronic Signature(s) Signed: 08/05/2021 5:36:21 PM By: Linton Ham MD Signed: 08/05/2021 6:11:36 PM By: Levan Hurst RN, BSN Entered By: Levan Hurst on 08/05/2021 11:31:51 -------------------------------------------------------------------------------- Problem List Details Patient Name: Date of Service: [name] RD C. 08/05/2021 10:45 A M Medical Record Number: [id number] Patient Account Number: [id number] Date of Birth/Sex: Treating RN: [birth year] (75 y.o. 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 06/11/2021 No Yes J44.9 Chronic obstructive pulmonary disease, unspecified 06/11/2021 No Yes G47.33 Obstructive sleep apnea (adult) (pediatric) 06/11/2021 No Yes Z86.718 Personal history of other venous thrombosis and embolism 06/11/2021 No Yes L03.116 Cellulitis of left lower limb 07/08/2021 No Yes Inactive Problems Resolved Problems Electronic Signature(s) Signed: 08/05/2021 5:36:21 PM By: Linton Ham MD Entered By: Linton Ham on 08/05/2021 12:10:55 -------------------------------------------------------------------------------- Progress Note Details Patient Name: Date of Service: [name] RD C. 08/05/2021 10:45 A M Medical Record Number: [id number] Patient Account Number: [id number] Date of Birth/Sex: Treating RN: [birth year] (75 y.o. [name] Primary Care Provider: Yaakov Guthrie Other Clinician: Referring Provider: Treating Provider/Extender: [name] in Treatment: 7 Subjective History of Present Illness (HPI) Admission 6/23 Mr. 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 He states this started a little over 2 months ago. This is now the third time he has developed a wound to this area. He followed with podiatry for this issue and has been using Betadine daily to it. He recently switched to silver alginate a week ago. He has been on doxycycline since May For this issue and just recently started a course of Keflex prescribed in the ED on 6/18 For possible cellulitis. Currently denies signs of infection. He uses a front offloading shoe to help with relieving the pressure associated with the wound formation. He reports minimal drainage. He does not have sensation to his feet and denies any pain.  Currently denies signs of infection. 6/29; this is a patient with chemotherapy-induced neuropathy. We had in clinic last year he was readmitted to our clinic last week. He has an area on the right  fifth metatarsal head. Dr. Heber [place] admitted to the to the clinic last week started him on Santyl they are using this daily. The wound started in March she went to podiatry. They are using Betadine to this I am not really sure about the benefit of Betadine in this type of situation. There is not appear to be any active infection. He is using a forefoot offloading boot 7/6; wound on the right fifth metatarsal head. Using Santyl daily. He is using a forefoot offloading boot but claims to be staying off this religiously Again talk to me about a vacation they had planned for mid August. They have a cancellation insurance and want to go ahead and put this in. I asked him to see if there is a specific form they want filled out. It does seem unlikely that he will be healed by that time [August 12]. Furthermore there are apparently no elevators they be going up stairs. 7/13; right fifth met head. Still not a viable surface using Hydrofera Blue and Santyl. Offloading in a forefoot offloading 7/20; right fifth metatarsal head. Surface looks better. Dimensions down by 4 x 3 mm. We will moved to straight [facility] with a total contact cast today His wife asked me to look at some erythema on the left anterior lower leg. It is indeed warm but not tender [however he is neuropathic]. He also has some degree of venous insufficiency I wonder some of this could be stasis dermatitis 7/22; patient presents for obligatory cast change. He reports not issues with the cast placed earlier this week. He tolerated this well. He has no complaints or questions today. 7/29; the patient has no complaints. The wound actually looks quite good rim of epithelialization nice healthy granulation. But only minimal improvement in size 8/4;  patient presents for follow-up. He has no issues or complaints today. He has tolerated the cast well over the past week. He denies signs of infection. 8/5; patient was seen yesterday. He states that the cast was uncomfortable and rubbing at the top. He is here for evaluation of replacement. 07/28/2021 upon evaluation today patient presents for follow-up he was last seen on the fifth for his last measurement. Fortunately there is no signs of active infection at this time which is great news and overall very pleased with where we stand. With that being said from the starting point of where he is managing this is definitely showing signs of improvement which is great news there is a little bit of hypergranulation I think silver nitrate could be helpful today. 8/17; wound is contracting nicely on the right fifth metatarsal head. Using [facility] and a total contact cast this is his second go round with this type of wound in this area. We previously had him in this clinic and heal them out lasting about 9 months. He apparently saw Dr. Posey Pronto and Dr. Paulla Dolly for routine and prophylactic nail care. They mention there might be a surgical option for his subluxing metatarsal head in this area Objective Constitutional Patient is hypertensive.. Pulse regular and within target range for patient.Marland Kitchen Respirations regular, non-labored and within target range.. Temperature is normal and within the target range for the patient.Marland Kitchen Appears in no distress. Vitals Time Taken: 11:14 AM, Height: 69 in, Weight: 210 lbs, BMI: 31, Temperature: 98.6 F, Pulse: 85 bpm, Respiratory Rate: 18 breaths/min, Blood Pressure: 150/79 mmHg. General Notes: Wound exam; the wound is smaller and the granulation looks healthy however it is a bit friable and a bit  hyper granulated. I used a chemical cauterization with a single silver nitrate to try and get the surface to be more conducive for epithelialization. There is no evidence of  infection Integumentary (Hair, Skin) Wound #4 status is Open. Original cause of wound was Gradually Appeared. The date acquired was: 03/30/2021. The wound has been in treatment 7 weeks. The wound is located on the Right,Lateral Foot. The wound measures 0.6cm length x 1.1cm width x 0.1cm depth; 0.518cm^2 area and 0.052cm^3 volume. There is Fat Layer (Subcutaneous Tissue) exposed. There is no tunneling or undermining noted. There is a medium amount of serosanguineous drainage noted. The wound margin is distinct with the outline attached to the wound base. There is large (67-100%) red, friable granulation within the wound bed. There is no necrotic tissue within the wound bed. 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 There was a T Contact Cast Procedure by Deno Etienne., MD. Post procedure Diagnosis Wound #4: Same as Pre-Procedure Pre-procedure diagnosis of Wound #4 is a Neuropathic Ulcer-Non Diabetic located on the Right,Lateral Foot . An Chemical Cauterization procedure was performed by Ricard Dillon., MD. Post procedure Diagnosis Wound #4: Same as Pre-Procedure Notes: silver nitrate Plan Follow-up Appointments: Return Appointment in 1 week. - with Dr. Arcola Jansky Shower/ Hygiene: May shower with protection but do not get wound dressing(s) wet. Edema Control - Lymphedema / SCD / Other: Elevate legs to the level of the heart or above for 30 minutes daily and/or when sitting, a frequency of: - throughout the day Avoid standing for long periods of time. Off-Loading: T Contact Cast to Right  Lower Extremity otal WOUND #4: - Foot Wound Laterality: Right, Lateral Cleanser: Soap and Water 1 x Per Week/7 Days Discharge Instructions: May shower and wash wound with dial antibacterial soap and water prior to dressing change. Cleanser: Wound Cleanser (Generic) 1 x Per Week/7 Days Discharge Instructions: Cleanse the wound with wound cleanser prior to applying a clean dressing using gauze sponges, not tissue or cotton balls. Prim Dressing: Hydrofera Blue Classic Foam, 2x2 in (Generic) 1 x Per Week/7 Days ary Discharge Instructions: Moisten with saline prior to applying to wound bed. Apply over Santyl. Secondary Dressing: Woven Gauze Sponge, Non-Sterile 4x4 in (Generic) 1 x Per Week/7 Days Discharge Instructions: Apply over primary dressing as directed. Secondary Dressing: Drawtex 4x4 in 1 x Per Week/7 Days Discharge Instructions: Apply over primary dressing as directed. Secured With: 57M Medipore H Soft Cloth Surgical T ape, 2x2 (in/yd) (Generic) 1 x Per Week/7 Days Discharge Instructions: Secure dressing with tape as directed. 1. Absolutely no reason to change the primary dressing which is Hydrofera Blue under a total contact cast. This was done in the standard fashion 2as noted silver nitrate x1 3. We spent some time talking about the approach after this closes. We are either going to have to proceed with possible surgery as suggested by podiatry or he may choose to try to offload this once again. I am not sure whether they have the resources for custom foot wear with custom inserts. He does not have diabetes and this is unlikely to be covered by insurance 4. I asked them to talk about this among themselves. If  this closes which hopefully will be soon then we will either need to refer him for consideration of surgery or once again make an attempt to manage this with nonsurgical offloading etc. Electronic Signature(s) Signed: 08/05/2021 5:36:21 PM By: Linton Ham MD Entered By: Linton Ham on 08/05/2021 12:18:37 -------------------------------------------------------------------------------- Total Contact Cast Details Patient Name: Date of Service: [name] RD C. 08/05/2021 10:45 A M Medical Record Number: [id number] Patient Account Number: [id number] Date of Birth/Sex: Treating RN: 04/20/1946 (75 y.o. [name] Primary Care Provider: Yaakov Guthrie Other Clinician: Referring Provider: Treating Provider/Extender: [name] in Treatment: 7 T Contact Cast Applied for Wound Assessment: otal Wound #4 Right,Lateral Foot Performed By: Physician Ricard Dillon., MD Post Procedure Diagnosis Same as Pre-procedure Electronic Signature(s) Signed: 08/05/2021 5:36:21 PM By: Linton Ham MD Entered By: Linton Ham on 08/05/2021 12:13:36 -------------------------------------------------------------------------------- SuperBill Details Patient Name: Date of Service: [name] RD C. 08/05/2021 Medical Record Number: [id number] Patient Account Number: [id number] Date of Birth/Sex: Treating RN: [birth year] (75 y.o. 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 J44.9 Chronic obstructive pulmonary disease, unspecified G47.33 Obstructive sleep apnea (adult) (pediatric) Z86.718 Personal history of other venous thrombosis and embolism L03.116 Cellulitis of left lower limb Facility Procedures CPT4 Code: [id number] Description: [phone] - APPLY TOTAL CONTACT LEG CAST ICD-10 Diagnosis Description L97.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

## 2021-08-12 ENCOUNTER — Encounter (HOSPITAL_BASED_OUTPATIENT_CLINIC_OR_DEPARTMENT_OTHER): Payer: Medicare Other | Admitting: Internal Medicine

## 2021-08-12 ENCOUNTER — Other Ambulatory Visit: Payer: Self-pay

## 2021-08-12 DIAGNOSIS — L97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 with unspecified severity: Secondary | ICD-10-CM | POA: Diagnosis not present

## 2021-08-12 NOTE — Progress Notes (Signed)
[name], [name] ([id number]) Visit Report for 08/12/2021 Arrival Information Details Patient Name: Date of Service: Caleb, [name] 08/12/2021 12:30 PM Medical Record Number: [id number] Patient Account Number: [id number] Date of Birth/Sex: Treating RN: 10-21-1946 (75 y.o. [name] Primary Care Joette Schmoker: [name] Other Clinician: Referring [name]: Treating [name]/Extender: [name] in Treatment: 8 Visit Information History Since Last Visit Added or deleted any medications: No Patient Arrived: Ambulatory Any new allergies or adverse reactions: No Arrival Time: 12:49 Had a fall or experienced change in No Accompanied By: spouse activities of daily living that may affect Transfer Assistance: None risk of falls: Patient Identification Verified: Yes Signs or symptoms of abuse/neglect since last visito No Secondary Verification Process Completed: Yes Hospitalized since last visit: No Patient Requires Transmission-Based Precautions: No Implantable device outside of the clinic excluding No Patient Has Alerts: Yes cellular tissue based products placed in the center Patient Alerts: Patient on Blood Thinner since last visit: R ABI: 1.01 TBI: 0.77 Has Dressing in Place as Prescribed: Yes L ABI: 1.22 TBI: 0.87 Has Footwear/Offloading in Place as Prescribed: Yes 05/01/21 Right: T Contact Cast otal Pain Present Now: No Electronic Signature(s) Signed: 08/12/2021 5:20:27 PM By: Caleb Hurst RN, BSN Entered By: [name] on 08/12/2021 12:50:30 -------------------------------------------------------------------------------- Encounter Discharge Information Details Patient Name: Date of Service: Caleb Alken RD C. 08/12/2021 12:30 PM Medical Record Number: [id number] Patient Account Number: [id number] Date of Birth/Sex: Treating RN: [birth year] (75 y.o. [name] Primary Care Kuba Shepherd: [name] Other Clinician: Referring  Lynel Forester: Treating [name]/Extender: [name] in Treatment: 8 Encounter Discharge Information Items Discharge Condition: Stable Ambulatory Status: Ambulatory Discharge Destination: Home Transportation: Private Auto Accompanied By: wife Schedule Follow-up Appointment: Yes Clinical Summary of Care: Patient Declined Electronic Signature(s) Signed: 08/12/2021 5:14:18 PM By: Caleb Hammock RN Entered By: [name] on 08/12/2021 15:59:35 -------------------------------------------------------------------------------- Lower Extremity Assessment Details Patient Name: Date of Service: Caleb Alken RD C. 08/12/2021 12:30 PM Medical Record Number: [id number] Patient Account Number: [id number] Date of Birth/Sex: Treating RN: [birth year] (75 y.o. [name] Primary Care Sakari Alkhatib: [name] Other Clinician: Referring [name]: Treating Keyonna Comunale/Extender: [name] in Treatment: 8 Edema Assessment Assessed: [name]: No] [name]: No] Edema: [Left: N] [Right: o] Calf Left: Right: Point of Measurement: 34 cm From Medial Instep 39 cm Ankle Left: Right: Point of Measurement: 13 cm From Medial Instep 23 cm Vascular Assessment Pulses: Dorsalis Pedis Palpable: [Right:Yes] Electronic Signature(s) Signed: 08/12/2021 5:20:27 PM By: Caleb Hurst RN, BSN Entered By: [name] on 08/12/2021 12:56:42 -------------------------------------------------------------------------------- Multi Wound Chart Details Patient Name: Date of Service: Caleb Alken RD C. 08/12/2021 12:30 PM Medical Record Number: [id number] Patient Account Number: [id number] Date of Birth/Sex: Treating RN: 03-Dec-1946 (75 y.o. 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] [N/A:N/A] Primary Etiology: [4:Glaucoma, Chronic Obstructive] [N/A:N/A] Comorbid History: [4:Pulmonary Disease (COPD), Sleep Apnea, Hypertension, Neuropathy, Received Chemotherapy, Received Radiation 03/30/2021] [N/A:N/A] Date Acquired: [4:8] [N/A:N/A] Weeks of Treatment: [4:Open] [N/A:N/A] Wound Status: [4:0.4x1x0.1] [N/A:N/A] Measurements L x W x D (cm) [4:0.314] [N/A:N/A] A (cm) : rea [4:0.031] [N/A:N/A] Volume (cm) : [4:94.30%] [N/A:N/A] % Reduction in Area: [4:94.40%] [N/A:N/A] % Reduction in Volume: [4:Full Thickness  Without Exposed] [N/A:N/A] Classification: [4:Support Structures Medium] [N/A:N/A] Exudate Amount: [4:Serosanguineous] [N/A:N/A] Exudate Type: [4:red, brown] [N/A:N/A] Exudate Color: [4:Distinct, outline attached] [N/A:N/A] Wound Margin: [4:Large (67-100%)] [N/A:N/A] Granulation Amount: [4:Red, Pink] [N/A:N/A] Granulation Quality: [4:Small (1-33%)] [N/A:N/A] Necrotic Amount: [4:Fat Layer (Subcutaneous Tissue): Yes N/A] Exposed Structures: [4:Fascia: No Tendon: No Muscle: No Joint: No Bone: No Medium (34-66%)] [N/A:N/A] Epithelialization: [4:T Contact Cast otal] [N/A:N/A] Treatment Notes Electronic Signature(s) Signed: 08/12/2021 4:01:36 PM By: [name] Signed: 08/12/2021 5:20:27 PM By: Caleb Hurst RN, BSN Entered By: Caleb Ham on 08/12/2021 13:08:40 -------------------------------------------------------------------------------- Multi-Disciplinary Care Everett Details Patient Name: Date of Service: Caleb Alken RD C. 08/12/2021 12:30 PM Medical Record Number: [id number] Patient Account Number: [id number] Date of Birth/Sex: Treating RN: 13-Mar-1946 (75 y.o. [name] Primary Care Nijah Orlich: [name] Other Clinician: Referring [name]: Treating [name]/Extender: [name] in Treatment: 8 Active Inactive Wound/Skin Impairment Nursing Diagnoses: Impaired tissue integrity Goals: Patient/caregiver will verbalize understanding of skin care regimen Date Initiated: 06/11/2021 Target Resolution Date: 09/11/2021 Goal Status: Active Ulcer/skin breakdown will have a volume reduction of 30% by week 4 Date Initiated: 06/11/2021 Date Inactivated: 07/15/2021 Target Resolution Date: 07/17/2021 Goal Status: Met Ulcer/skin breakdown will have a volume reduction of 50% by week 8 Date Initiated: 07/15/2021 Date Inactivated: 08/12/2021 Target Resolution Date: 08/14/2021 Goal Status: Met Interventions: Assess patient/caregiver ability to obtain necessary supplies Assess patient/caregiver ability to perform ulcer/skin care regimen upon admission and as needed Assess ulceration(s) every visit Notes: Electronic Signature(s) Signed: 08/12/2021 5:20:27 PM By: Caleb Hurst RN, BSN Entered By: [name] on 08/12/2021 13:05:27 -------------------------------------------------------------------------------- Pain Assessment Details Patient Name: Date of Service: Caleb Alken RD C. 08/12/2021 12:30 PM Medical Record Number: [id number] Patient Account Number: [id number] Date of Birth/Sex: Treating RN: [birth year] (74 y.o. [name] Primary Care [place]: [name] Other Clinician: Referring Jaleya Pebley: Treating Camira Geidel/Extender: [name] in Treatment: 8 Active Problems Location of Pain Severity and Description of Pain Patient Has Paino No Site Locations Pain Management and Medication Current Pain Management: Electronic Signature(s) Signed: 08/12/2021 5:20:27 PM By: Caleb Hurst RN, BSN Entered By: [name] on 08/12/2021  12:56:35 -------------------------------------------------------------------------------- Patient/Caregiver Education Details Patient Name: Date of Service: Caleb Alken RD C. 8/24/2022andnbsp12:30 PM Medical Record Number: [id number] Patient Account Number: [id number] Date of Birth/Gender: Treating RN: 1946/11/27 (75 y.o. [name] Primary Care Physician: [name] Other Clinician: Referring Physician: Treating Physician/Extender: [name] in Treatment: 8 Education Assessment Education Provided To: Patient Education Topics Provided Wound/Skin Impairment: Methods: Explain/Verbal Responses: State content correctly Electronic Signature(s) Signed: 08/12/2021 5:20:27 PM By: Caleb Hurst RN, BSN Entered By: [name] on 08/12/2021 13:05:41 -------------------------------------------------------------------------------- Wound Assessment Details Patient Name: Date of Service: Caleb Alken RD C. 08/12/2021 12:30 PM Medical Record Number: [id number] Patient Account Number: [id number] Date of Birth/Sex: Treating RN: July 22, 1946 (75 y.o. [name] Primary Care Jacarri Gesner: [name] Other Clinician: Referring Kyllie Pettijohn: Treating Khiya Friese/Extender: [name] in Treatment: 8 Wound Status Wound Number: 4 Primary Neuropathic Ulcer-Non Diabetic Etiology: Wound Location: Right, Lateral Foot Wound Open Wounding Event: Gradually Appeared Status: Date Acquired: 03/30/2021 Comorbid Glaucoma, Chronic Obstructive Pulmonary Disease (COPD), Sleep Weeks Of Treatment: 8 History: Apnea, Hypertension, Neuropathy, Received Chemotherapy, Clustered Wound: No Received Radiation Wound Measurements Length: (cm) 0.4 Width: (cm) 1 Depth: (cm) 0.1 Area: (cm) 0.314 Volume: (cm) 0.031 % Reduction in Area: 94.3% % Reduction in Volume: 94.4% Epithelialization: Medium (34-66%) Tunneling: No Undermining: No Wound  Description Classification: Full Thickness Without  Exposed Support Structures Wound Margin: Distinct, outline attached Exudate Amount: Medium Exudate Type: Serosanguineous Exudate Color: red, brown Foul Odor After Cleansing: No Slough/Fibrino Yes Wound Bed Granulation Amount: Large (67-100%) Exposed Structure Granulation Quality: Red, Pink Fascia Exposed: No Necrotic Amount: Small (1-33%) Fat Layer (Subcutaneous Tissue) Exposed: Yes Tendon Exposed: No Muscle Exposed: No Joint Exposed: No Bone Exposed: No Treatment Notes Wound #4 (Foot) Wound Laterality: Right, Lateral Cleanser Soap and Water Discharge Instruction: May shower and wash wound with dial antibacterial soap and water prior to dressing change. Wound Cleanser Discharge Instruction: Cleanse the wound with wound cleanser prior to applying a clean dressing using gauze sponges, not tissue or cotton balls. Peri-Wound Care Topical Primary Dressing Hydrofera Blue Classic Foam, 2x2 in Discharge Instruction: Moisten with saline prior to applying to wound bed. Apply over Santyl. Secondary Dressing Woven Gauze Sponge, Non-Sterile 4x4 in Discharge Instruction: Apply over primary dressing as directed. Drawtex 4x4 in Discharge Instruction: Apply over primary dressing as directed. Secured With 72M [place] Surgical T ape, 2x2 (in/yd) Discharge Instruction: Secure dressing with tape as directed. Compression Wrap Compression Stockings Add-Ons Electronic Signature(s) Signed: 08/12/2021 5:20:27 PM By: Caleb Hurst RN, BSN Entered By: [name] on 08/12/2021 13:04:47 -------------------------------------------------------------------------------- [place] Details Patient Name: Date of Service: Caleb Alken RD C. 08/12/2021 12:30 PM Medical Record Number: [id number] Patient Account Number: [id number] Date of Birth/Sex: Treating RN: [birth year] (75 y.o. [name] Primary Care Ashaki Frosch: [name] Other  Clinician: Referring Azora Bonzo: Treating Mauro Arps/Extender: [name] in Treatment: 8 Vital Signs Time Taken: 12:50 Temperature (F): 98.5 Height (in): 69 Pulse (bpm): 85 Weight (lbs): 210 Respiratory Rate (breaths/min): 17 Body Mass Index (BMI): 31 Blood Pressure (mmHg): 142/78 Reference Range: 80 - 120 mg / dl Electronic Signature(s) Signed: 08/12/2021 5:20:27 PM By: Caleb Hurst RN, BSN Entered By: [name] on 08/12/2021 12:51:10

## 2021-08-14 NOTE — Progress Notes (Signed)
[name] ([id number]) Visit Report for 08/12/2021 HPI Details Patient Name: Date of Service: [name], [name] 08/12/2021 12:30 PM Medical Record Number: [id number] Patient Account Number: [id number] Date of Birth/Sex: Treating RN: February 03, 1946 (74 y.o. 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 He states this started a little over 2 months ago. This is now the third time he has developed a wound to this area. He followed with podiatry for this issue and has been using Betadine daily to it. He recently switched to silver alginate a week ago. He has been on doxycycline since May For this issue and just recently started a course of Keflex prescribed in the ED on 6/18 For possible cellulitis. Currently denies signs of infection. He uses a front offloading shoe to help with relieving the pressure associated with the wound formation. He reports minimal drainage. He does not have sensation to his feet and denies any pain. Currently denies signs of infection. 6/29; this is a patient with chemotherapy-induced neuropathy. We had in clinic last year he was readmitted to our clinic last week. He has an area on the right fifth metatarsal head. Dr. Heber [place] admitted to the to the clinic last week started him on Santyl they are using this daily. The wound started in March she went to podiatry. They are using Betadine to this I am not really sure about the benefit of Betadine in this type of situation. There is not appear to be any active infection. He is using a forefoot offloading boot 7/6; wound on the right fifth metatarsal  head. Using Santyl daily. He is using a forefoot offloading boot but claims to be staying off this religiously Again talk to me about a vacation they had planned for mid August. They have a cancellation insurance and want to go ahead and put this in. I asked him to see if there is a specific form they want filled out. It does seem unlikely that he will be healed by that time [August 12]. Furthermore there are apparently no elevators they be going up stairs. 7/13; right fifth met head. Still not a viable surface using Hydrofera Blue and Santyl. Offloading in a forefoot offloading 7/20; right fifth metatarsal head. Surface looks better. Dimensions down by 4 x 3 mm. We will moved to straight [facility] with a total contact cast today His wife asked me to look at some erythema on the left anterior lower leg. It is indeed warm but not tender [however he is neuropathic]. He also has some degree of venous insufficiency I wonder some of this could be stasis dermatitis 7/22; patient presents for obligatory cast change. He reports not issues with the cast placed earlier this week. He tolerated this well. He has no complaints or questions today. 7/29; the patient has no complaints. The wound actually looks quite good rim of epithelialization nice healthy granulation. But only minimal improvement in size 8/4; patient presents for follow-up. He has no issues or complaints today. He has tolerated the cast well over the past week. He denies signs of infection. 8/5; patient was seen yesterday. He states that the cast was uncomfortable and rubbing at the top. He  is here for evaluation of replacement. 07/28/2021 upon evaluation today patient presents for follow-up he was last seen on the fifth for his last measurement. Fortunately there is no signs of active infection at this time which is great news and overall very pleased with where we stand. With that being said from the starting point of where he is  managing this is definitely showing signs of improvement which is great news there is a little bit of hypergranulation I think silver nitrate could be helpful today. 8/17; wound is contracting nicely on the right fifth metatarsal head. Using [facility] and a total contact cast this is his second go round with this type of wound in this area. We previously had him in this clinic and heal them out lasting about 9 months. He apparently saw Dr. Posey Pronto and Dr. Paulla Dolly for routine and prophylactic nail care. They mention there might be a surgical option for his subluxing metatarsal head in this area 8/24; wound is down slightly in surface area however he has a considerable amount of maceration around the wound extending into the dorsal foot at the base of his toes and even proximally. There is not enough drainage from the small open area currently to do this. I wonder whether he simply had water go down this in the shower although he is using a cast protector. The other thing is I wonder if he is sitting out in the sun and sweating excessively. Electronic Signature(s) Signed: 08/12/2021 4:01:36 PM By: Linton Ham MD Entered By: Linton Ham on 08/12/2021 13:10:03 -------------------------------------------------------------------------------- Physical Exam Details Patient Name: Date of Service: Caleb Alken RD C. 08/12/2021 12:30 PM Medical Record Number: [id number] Patient Account Number: [id number] Date of Birth/Sex: Treating RN: [birth year] (75 y.o. [name] Primary Care Provider: Yaakov Everett Other Clinician: Referring Provider: Treating Provider/Extender: [name] in Treatment: 8 Constitutional Sitting or standing Blood Pressure is within target range for patient.. Pulse regular and within target range for patient.Marland Kitchen Respirations regular, non-labored and within target range.. Temperature is normal and within the target range for the patient.Marland Kitchen Appears in  no distress. Cardiovascular Pedal pulses are palpable. Notes Wound exam; the wound is smaller and the granulation looks healthy. There is no hypergranulation no tissue friability. No chemical cauterization today. No mechanical debridement was felt to be necessary. There is no evidence of infection. As noted significant skin maceration distally to the base of his toes and even proximally above the wound. I do not think there is any evidence of infection Electronic Signature(s) Signed: 08/12/2021 4:01:36 PM By: Linton Ham MD Entered By: Linton Ham on 08/12/2021 13:11:17 -------------------------------------------------------------------------------- Physician Orders Details Patient Name: Date of Service: Caleb Alken RD C. 08/12/2021 12:30 PM Medical Record Number: [id number] Patient Account Number: [id number] Date of Birth/Sex: Treating RN: 1946/10/30 (75 y.o. 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 - with Dr. Dellia Nims Return A Bathing/ Shower/ Hygiene May shower with protection but do not get wound dressing(s) wet. Edema Control - Lymphedema / SCD / Other Elevate legs to the level of the heart or above  for 30 minutes daily and/or when sitting, a frequency of: - throughout the day Avoid standing for long periods of time. Off-Loading Total Contact Cast to Right Lower Extremity Wound Treatment Wound #4 - Foot Wound Laterality: Right,  Lateral Cleanser: Soap and Water 1 x Per Week/7 Days Discharge Instructions: May shower and wash wound with dial antibacterial soap and water prior to dressing change. Cleanser: Wound Cleanser (Generic) 1 x Per Week/7 Days Discharge Instructions: Cleanse the wound with wound cleanser prior to applying a clean dressing using gauze sponges, not tissue or cotton balls. Prim Dressing: Hydrofera Blue Classic Foam, 2x2 in (Generic) 1 x Per Week/7 Days ary Discharge Instructions: Moisten with saline prior to applying to wound bed. Apply over Santyl. Secondary Dressing: Woven Gauze Sponge, Non-Sterile 4x4 in (Generic) 1 x Per Week/7 Days Discharge Instructions: Apply over primary dressing as directed. Secondary Dressing: Drawtex 4x4 in 1 x Per Week/7 Days Discharge Instructions: Apply over primary dressing as directed. Secured With: 21M Medipore H Soft Cloth Surgical Tape, 2x2 (in/yd) (Generic) 1 x Per Week/7 Days Discharge Instructions: Secure dressing with tape as directed. Electronic Signature(s) Signed: 08/12/2021 4:01:36 PM By: Linton Ham MD Signed: 08/12/2021 5:20:27 PM By: Levan Hurst RN, BSN Entered By: Levan Hurst on 08/12/2021 13:03:08 -------------------------------------------------------------------------------- Problem List Details Patient Name: Date of Service: Caleb Alken RD C. 08/12/2021 12:30 PM Medical Record Number: [id number] Patient Account Number: [id number] Date of Birth/Sex: Treating RN: 30-Jul-1946 (75 y.o. 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 06/11/2021 No Yes J44.9 Chronic obstructive pulmonary disease, unspecified 06/11/2021 No Yes G47.33 Obstructive sleep  apnea (adult) (pediatric) 06/11/2021 No Yes Z86.718 Personal history of other venous thrombosis and embolism 06/11/2021 No Yes L03.116 Cellulitis of left lower limb 07/08/2021 No Yes Inactive Problems Resolved Problems Electronic Signature(s) Signed: 08/12/2021 4:01:36 PM By: Linton Ham MD Entered By: Linton Ham on 08/12/2021 13:06:58 -------------------------------------------------------------------------------- Progress Note Details Patient Name: Date of Service: Caleb Alken RD C. 08/12/2021 12:30 PM Medical Record Number: [id number] Patient Account Number: [id number] Date of Birth/Sex: Treating RN: 05/22/1946 (75 y.o. 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 He states this started a little over 2 months ago. This is now the third time he has developed a wound to this area. He followed with podiatry for this issue and has been using Betadine daily to it. He recently switched to silver alginate a week ago. He has been on doxycycline since May For this issue and just recently started a course of Keflex prescribed in the ED on 6/18 For possible cellulitis. Currently denies signs of infection. He uses a front offloading shoe to help with relieving the pressure associated with the wound formation. He reports minimal drainage. He does not have sensation to his feet and denies any pain. Currently denies signs of infection. 6/29; this is a patient with chemotherapy-induced neuropathy. We had in clinic last year he was readmitted to our clinic last week. He has an area on the  right fifth metatarsal head. Dr.  Heber [place] admitted to the to the clinic last week started him on Santyl they are using this daily. The wound started in March she went to podiatry. They are using Betadine to this I am not really sure about the benefit of Betadine in this type of situation. There is not appear to be any active infection. He is using a forefoot offloading boot 7/6; wound on the right fifth metatarsal head. Using Santyl daily. He is using a forefoot offloading boot but claims to be staying off this religiously Again talk to me about a vacation they had planned for mid August. They have a cancellation insurance and want to go ahead and put this in. I asked him to see if there is a specific form they want filled out. It does seem unlikely that he will be healed by that time [August 12]. Furthermore there are apparently no elevators they be going up stairs. 7/13; right fifth met head. Still not a viable surface using Hydrofera Blue and Santyl. Offloading in a forefoot offloading 7/20; right fifth metatarsal head. Surface looks better. Dimensions down by 4 x 3 mm. We will moved to straight [facility] with a total contact cast today His wife asked me to look at some erythema on the left anterior lower leg. It is indeed warm but not tender [however he is neuropathic]. He also has some degree of venous insufficiency I wonder some of this could be stasis dermatitis 7/22; patient presents for obligatory cast change. He reports not issues with the cast placed earlier this week. He tolerated this well. He has no complaints or questions today. 7/29; the patient has no complaints. The wound actually looks quite good rim of epithelialization nice healthy granulation. But only minimal improvement in size 8/4; patient presents for follow-up. He has no issues or complaints today. He has tolerated the cast well over the past week. He denies signs of infection. 8/5; patient was seen yesterday. He states that the cast was uncomfortable  and rubbing at the top. He is here for evaluation of replacement. 07/28/2021 upon evaluation today patient presents for follow-up he was last seen on the fifth for his last measurement. Fortunately there is no signs of active infection at this time which is great news and overall very pleased with where we stand. With that being said from the starting point of where he is managing this is definitely showing signs of improvement which is great news there is a little bit of hypergranulation I think silver nitrate could be helpful today. 8/17; wound is contracting nicely on the right fifth metatarsal head. Using [facility] and a total contact cast this is his second go round with this type of wound in this area. We previously had him in this clinic and heal them out lasting about 9 months. He apparently saw Dr. Posey Pronto and Dr. Paulla Dolly for routine and prophylactic nail care. They mention there might be a surgical option for his subluxing metatarsal head in this area 8/24; wound is down slightly in surface area however he has a considerable amount of maceration around the wound extending into the dorsal foot at the base of his toes and even proximally. There is not enough drainage from the small open area currently to do this. I wonder whether he simply had water go down this in the shower although he is using a cast protector. The other thing is I wonder if he is sitting out in the sun and  sweating excessively. Objective Constitutional Sitting or standing Blood Pressure is within target range for patient.. Pulse regular and within target range for patient.Marland Kitchen Respirations regular, non-labored and within target range.. Temperature is normal and within the target range for the patient.Marland Kitchen Appears in no distress. Vitals Time Taken: 12:50 PM, Height: 69 in, Weight: 210 lbs, BMI: 31, Temperature: 98.5 F, Pulse: 85 bpm, Respiratory Rate: 17 breaths/min, Blood Pressure: 142/78 mmHg. Cardiovascular Pedal pulses are  palpable. General Notes: Wound exam; the wound is smaller and the granulation looks healthy. There is no hypergranulation no tissue friability. No chemical cauterization today. No mechanical debridement was felt to be necessary. There is no evidence of infection. As noted significant skin maceration distally to the base of his toes and even proximally above the wound. I do not think there is any evidence of infection Integumentary (Hair, Skin) Wound #4 status is Open. Original cause of wound was Gradually Appeared. The date acquired was: 03/30/2021. The wound has been in treatment 8 weeks. The wound is located on the Right,Lateral Foot. The wound measures 0.4cm length x 1cm width x 0.1cm depth; 0.314cm^2 area and 0.031cm^3 volume. There is Fat Layer (Subcutaneous Tissue) exposed. There is no tunneling or undermining noted. There is a medium amount of serosanguineous drainage noted. The wound margin is distinct with the outline attached to the wound base. There is large (67-100%) red, pink granulation within the wound bed. There is a small (1- 33%) amount of necrotic tissue within the wound bed. 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 There was a T Contact Cast Procedure by Deno Etienne., MD. Post procedure Diagnosis Wound #4: Same as Pre-Procedure Plan Follow-up Appointments: Return Appointment in 1 week. - with Dr. Arcola Jansky Shower/ Hygiene: May shower with protection but do not get wound dressing(s) wet. Edema Control - Lymphedema / SCD / Other: Elevate legs to the level of the heart or above  for 30 minutes daily and/or when sitting, a frequency of: - throughout the day Avoid standing for long periods of time. Off-Loading: T Contact Cast to Right Lower Extremity otal WOUND #4: - Foot Wound Laterality: Right, Lateral Cleanser: Soap and Water 1 x Per Week/7 Days Discharge Instructions: May shower and wash wound with dial antibacterial soap and water prior to dressing change. Cleanser: Wound Cleanser (Generic) 1 x Per Week/7 Days Discharge Instructions: Cleanse the wound with wound cleanser prior to applying a clean dressing using gauze sponges, not tissue or cotton balls. Prim Dressing: Hydrofera Blue Classic Foam, 2x2 in (Generic) 1 x Per Week/7 Days ary Discharge Instructions: Moisten with saline prior to applying to wound bed. Apply over Santyl. Secondary Dressing: Woven Gauze Sponge, Non-Sterile 4x4 in (Generic) 1 x Per Week/7 Days Discharge Instructions: Apply over primary dressing as directed. Secondary Dressing: Drawtex 4x4 in 1 x Per Week/7 Days Discharge Instructions: Apply over primary dressing as directed. Secured With: 26M Medipore H Soft Cloth Surgical T ape, 2x2 (in/yd) (Generic) 1 x Per Week/7 Days Discharge Instructions: Secure dressing with tape as directed. 1. I am continuing with Hydrofera Blue under the total contact cast 2. I have asked him to be more careful with a cast protector. Also I wonder if  he is simply excessively sweating by sitting out side and I have asked him to avoid this. 3. I see no evidence of infection and although the area in the periwound was macerated I am reluctant to stop the total contact cast today Electronic Signature(s) Signed: 08/12/2021 4:01:36 PM By: Linton Ham MD Entered By: Linton Ham on 08/12/2021 13:12:13 -------------------------------------------------------------------------------- Total Contact Cast Details Patient Name: Date of Service: Caleb Alken RD C. 08/12/2021 12:30 PM Medical Record Number:  [id number] Patient Account Number: [id number] Date of Birth/Sex: Treating RN: 02/07/1946 (75 y.o. [name] Primary Care Provider: Yaakov Everett Other Clinician: Referring Provider: Treating Provider/Extender: [name] in Treatment: 8 T Contact Cast Applied for Wound Assessment: otal Wound #4 Right,Lateral Foot Performed By: Physician Ricard Dillon., MD Post Procedure Diagnosis Same as Pre-procedure Electronic Signature(s) Signed: 08/12/2021 4:01:36 PM By: Linton Ham MD Entered By: Linton Ham on 08/12/2021 13:08:50 -------------------------------------------------------------------------------- SuperBill Details Patient Name: Date of Service: Caleb Alken RD C. 08/12/2021 Medical Record Number: [id number] Patient Account Number: [id number] Date of Birth/Sex: Treating RN: [birth year] (75 y.o. 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 J44.9 Chronic obstructive pulmonary disease, unspecified G47.33 Obstructive sleep apnea (adult) (pediatric) Z86.718 Personal history of other venous thrombosis and embolism L03.116 Cellulitis of left lower limb Facility Procedures CPT4 Code: [id number] Description: [phone] - APPLY TOTAL CONTACT LEG CAST ICD-10 Diagnosis Description L97.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 with unspecified severit Modifier: y Quantity: 1 Physician Procedures : CPT4 Code Description Modifier [id number] - WC PHYS LEVEL 3 - EST PT ICD-10 Diagnosis Description 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

## 2021-08-19 ENCOUNTER — Other Ambulatory Visit: Payer: Self-pay

## 2021-08-19 ENCOUNTER — Encounter (HOSPITAL_BASED_OUTPATIENT_CLINIC_OR_DEPARTMENT_OTHER): Payer: Medicare Other | Admitting: Internal Medicine

## 2021-08-19 DIAGNOSIS — L97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 with unspecified severity: Secondary | ICD-10-CM | POA: Diagnosis not present

## 2021-08-20 NOTE — Progress Notes (Signed)
YAIR, DUSZA ([id number]) Visit Report for 08/19/2021 Arrival Information Details Patient Name: Date of Service: [name], Caleb RD C. 08/19/2021 12:30 PM Medical Record Number: [id number] Patient Account Number: [id number] Date of Birth/Sex: Treating RN: 01-05-1946 (75 y.o. [name] Primary Care Ijeoma Loor: Yaakov Guthrie Other Clinician: Referring Ragnar Waas: Treating Brisha Mccabe/Extender: [name] in Treatment: 9 Visit Information History Since Last Visit Added or deleted any medications: No Patient Arrived: Ambulatory Any new allergies or adverse reactions: No Arrival Time: 12:37 Had a fall or experienced change in No Accompanied By: spouse activities of daily living that may affect Transfer Assistance: None risk of falls: Patient Identification Verified: Yes Signs or symptoms of abuse/neglect since last visito No Secondary Verification Process Completed: Yes Hospitalized since last visit: No Patient Requires Transmission-Based Precautions: No Implantable device outside of the clinic excluding No Patient Has Alerts: Yes cellular tissue based products placed in the center Patient Alerts: Patient on Blood Thinner since last visit: R ABI: 1.01 TBI: 0.77 Has Dressing in Place as Prescribed: Yes L ABI: 1.22 TBI: 0.87 Has Footwear/Offloading in Place as Prescribed: Yes 05/01/21 Right: T Contact Cast otal Pain Present Now: No Electronic Signature(s) Signed: 08/20/2021 5:19:32 PM By: Levan Hurst RN, BSN Entered By: Levan Hurst on 08/19/2021 12:38:06 -------------------------------------------------------------------------------- Encounter Discharge Information Details Patient Name: Date of Service: Caleb Alken RD C. 08/19/2021 12:30 PM Medical Record Number: [id number] Patient Account Number: [id number] Date of Birth/Sex: Treating RN: [birth year] (75 y.o. [name] Primary Care Lamaria Hildebrandt: Yaakov Guthrie Other Clinician: Referring  Betania Dizon: Treating Diontre Harps/Extender: [name] in Treatment: 9 Encounter Discharge Information Items Discharge Condition: Stable Ambulatory Status: Ambulatory Discharge Destination: Home Transportation: Private Auto Accompanied By: spouse Schedule Follow-up Appointment: Yes Clinical Summary of Care: Patient Declined Electronic Signature(s) Signed: 08/20/2021 5:19:32 PM By: Levan Hurst RN, BSN Entered By: Levan Hurst on 08/19/2021 16:15:31 -------------------------------------------------------------------------------- Lower Extremity Assessment Details Patient Name: Date of Service: Caleb Alken RD C. 08/19/2021 12:30 PM Medical Record Number: [id number] Patient Account Number: [id number] Date of Birth/Sex: Treating RN: [birth year] (75 y.o. [name] Primary Care Jomel Whittlesey: Yaakov Guthrie Other Clinician: Referring Fronnie Urton: Treating Jamie Hafford/Extender: [name] in Treatment: 9 Edema Assessment Assessed: Shirlyn Goltz: No] Patrice Paradise: No] Edema: [Left: N] [Right: o] Calf Left: Right: Point of Measurement: 34 cm From Medial Instep 37 cm Ankle Left: Right: Point of Measurement: 13 cm From Medial Instep 23 cm Vascular Assessment Pulses: Dorsalis Pedis Palpable: [Right:Yes] Electronic Signature(s) Signed: 08/20/2021 5:19:32 PM By: Levan Hurst RN, BSN Entered By: Levan Hurst on 08/19/2021 12:53:57 -------------------------------------------------------------------------------- Multi Wound Chart Details Patient Name: Date of Service: [name] 08/19/2021 12:30 PM Medical Record Number: [id number] Patient Account Number: [id number] Date of Birth/Sex: Treating RN: 06-Aug-1946 (75 y.o. 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] [N/A:N/A] Primary Etiology: [4:Glaucoma, Chronic Obstructive] [N/A:N/A] Comorbid History: [4:Pulmonary Disease (COPD), Sleep Apnea, Hypertension, Neuropathy, Received Chemotherapy, Received Radiation 03/30/2021] [N/A:N/A] Date Acquired: [4:9] [N/A:N/A] Weeks of Treatment: [4:Open] [N/A:N/A] Wound Status: [4:0.4x0.9x0.3] [N/A:N/A] Measurements L x W x D (cm) [4:0.283] [N/A:N/A] A (cm) : rea [4:0.085] [N/A:N/A] Volume (cm) : [4:94.90%] [N/A:N/A] % Reduction in A rea: [4:84.50%] [N/A:N/A] % Reduction in Volume: [4:6] Starting  Position 1 (o'clock): [4:9] Ending Position 1 (o'clock): [4:0.2] Maximum Distance 1 (cm): [4:Yes] [N/A:N/A] Undermining: [4:Full Thickness Without Exposed] [N/A:N/A] Classification: [4:Support Structures Medium] [N/A:N/A] Exudate Amount: [4:Serosanguineous] [N/A:N/A] Exudate Type: [4:red, brown] [N/A:N/A] Exudate Color: [4:Distinct, outline attached] [N/A:N/A] Wound Margin: [4:Large (67-100%)] [N/A:N/A] Granulation Amount: [4:Red, Pink] [N/A:N/A] Granulation Quality: [4:None Present (0%)] [N/A:N/A] Necrotic Amount: [4:Fat Layer (Subcutaneous Tissue): Yes N/A] Exposed Structures: [4:Fascia: No Tendon: No Muscle: No Joint: No Bone: No Medium (34-66%)] [N/A:N/A] Epithelialization: [4:T Contact Cast otal] [N/A:N/A] Treatment Notes Electronic Signature(s) Signed: 08/19/2021 4:28:25 PM By: Linton Ham MD Signed: 08/20/2021 5:19:32 PM By: Levan Hurst RN, BSN Entered By: Linton Ham on 08/19/2021 13:19:31 -------------------------------------------------------------------------------- Multi-Disciplinary Care Plan Details Patient Name: Date of Service: Caleb Alken RD C. 08/19/2021 12:30 PM Medical  Record Number: [id number] Patient Account Number: [id number] Date of Birth/Sex: Treating RN: 06/19/1946 (75 y.o. [name] Primary Care Kerrigan Gombos: Yaakov Guthrie Other Clinician: Referring Detric Scalisi: Treating Analese Sovine/Extender: [name] in Treatment: 9 Active Inactive Wound/Skin Impairment Nursing Diagnoses: Impaired tissue integrity Goals: Patient/caregiver will verbalize understanding of skin care regimen Date Initiated: 06/11/2021 Target Resolution Date: 09/11/2021 Goal Status: Active Ulcer/skin breakdown will have a volume reduction of 30% by week 4 Date Initiated: 06/11/2021 Date Inactivated: 07/15/2021 Target Resolution Date: 07/17/2021 Goal Status: Met Ulcer/skin breakdown will have a volume reduction of 50% by week 8 Date Initiated: 07/15/2021 Date Inactivated: 08/12/2021 Target Resolution Date: 08/14/2021 Goal Status: Met Interventions: Assess patient/caregiver ability to obtain necessary supplies Assess patient/caregiver ability to perform ulcer/skin care regimen upon admission and as needed Assess ulceration(s) every visit Notes: Electronic Signature(s) Signed: 08/20/2021 5:19:32 PM By: Levan Hurst RN, BSN Entered By: Levan Hurst on 08/19/2021 13:05:50 -------------------------------------------------------------------------------- Pain Assessment Details Patient Name: Date of Service: Caleb Alken RD C. 08/19/2021 12:30 PM Medical Record Number: [id number] Patient Account Number: [id number] Date of Birth/Sex: Treating RN: [birth year] (75 y.o. [name] Primary Care Rajendra Spiller: Yaakov Guthrie Other Clinician: Referring Tadeusz Stahl: Treating Hakim Minniefield/Extender: [name] in Treatment: 9 Active Problems Location of Pain Severity and Description of Pain Patient Has Paino No Site Locations Pain Management and Medication Current Pain Management: Electronic Signature(s) Signed: 08/20/2021 5:19:32 PM By:  Levan Hurst RN, BSN Entered By: Levan Hurst on 08/19/2021 12:53:37 -------------------------------------------------------------------------------- Patient/Caregiver Education Details Patient Name: Date of Service: Caleb Alken RD C. 8/31/2022andnbsp12:30 PM Medical Record Number: [id number] Patient Account Number: [id number] Date of Birth/Gender: Treating RN: 09/21/1946 (75 y.o. [name] Primary Care Physician: Yaakov Guthrie Other Clinician: Referring Physician: Treating Physician/Extender: [name] in Treatment: 9 Education Assessment Education Provided To: Patient Education Topics Provided Wound/Skin Impairment: Methods: Explain/Verbal Responses: State content correctly [employer]) Signed: 08/20/2021 5:19:32 PM By: Levan Hurst RN, BSN Entered By: Levan Hurst on 08/19/2021 13:06:02 -------------------------------------------------------------------------------- Wound Assessment Details Patient Name: Date of Service: Caleb Alken RD C. 08/19/2021 12:30 PM Medical Record Number: [id number] Patient Account Number: [id number] Date of Birth/Sex: Treating RN: 07/08/1946 (75 y.o. [name] Primary Care Shaine Newmark: Yaakov Guthrie Other Clinician: Referring Idabelle Mcpeters: Treating Donjuan Robison/Extender: [name] in Treatment: 9 Wound Status Wound Number: 4 Primary Neuropathic Ulcer-Non Diabetic Etiology: Wound Location: Right, Lateral Foot Wound Open Wounding Event: Gradually Appeared Status: Date Acquired: 03/30/2021 Comorbid Glaucoma, Chronic Obstructive Pulmonary Disease (COPD), Sleep Weeks Of Treatment: 9 History: Apnea, Hypertension, Neuropathy, Received Chemotherapy, Clustered Wound: No Received Radiation Photos Wound Measurements Length: (cm) 0.4 Width: (cm) 0.9 Depth: (cm) 0.3 Area: (cm) 0.283 Volume: (cm) 0.085 % Reduction in Area:  94.9% % Reduction in Volume:  84.5% Epithelialization: Medium (34-66%) Tunneling: No Undermining: Yes Starting Position (o'clock): 6 Ending Position (o'clock): 9 Maximum Distance: (cm) 0.2 Wound Description Classification: Full Thickness Without Exposed Support Structures Wound Margin: Distinct, outline attached Exudate Amount: Medium Exudate Type: Serosanguineous Exudate Color: red, brown Foul Odor After Cleansing: No Slough/Fibrino No Wound Bed Granulation Amount: Large (67-100%) Exposed Structure Granulation Quality: Red, Pink Fascia Exposed: No Necrotic Amount: None Present (0%) Fat Layer (Subcutaneous Tissue) Exposed: Yes Tendon Exposed: No Muscle Exposed: No Joint Exposed: No Bone Exposed: No Treatment Notes Wound #4 (Foot) Wound Laterality: Right, Lateral Cleanser Soap and Water Discharge Instruction: May shower and wash wound with dial antibacterial soap and water prior to dressing change. Wound Cleanser Discharge Instruction: Cleanse the wound with wound cleanser prior to applying a clean dressing using gauze sponges, not tissue or cotton balls. Peri-Wound Care Topical Primary Dressing Promogran Prisma Matrix, 4.34 (sq in) (silver collagen) Discharge Instruction: Moisten collagen with saline or hydrogel Secondary Dressing Woven Gauze Sponge, Non-Sterile 4x4 in Discharge Instruction: Apply over primary dressing as directed. Drawtex 4x4 in Discharge Instruction: Apply over primary dressing as directed. Secured With [employer] [place] [facility] T ape, 2x2 (in/yd) Discharge Instruction: Secure dressing with tape as directed. Compression Wrap Compression Stockings Add-Ons Electronic Signature(s) Signed: 08/19/2021 3:05:52 PM By: Sandre Kitty Signed: 08/20/2021 5:19:32 PM By: Levan Hurst RN, BSN Entered By: Sandre Kitty on 08/19/2021 12:49:39 -------------------------------------------------------------------------------- [place] Details Patient Name: Date of Service: Caleb Alken RD C. 08/19/2021 12:30 PM Medical Record Number: [id number] Patient Account Number: [id number] Date of Birth/Sex: Treating RN: 05-07-1946 (75 y.o. [name] Primary Care Elston Aldape: Yaakov Guthrie Other Clinician: Referring Ifeoluwa Beller: Treating Chao Blazejewski/Extender: [name] in Treatment: 9 Vital Signs Time Taken: 12:37 Temperature (F): 97.7 Height (in): 69 Pulse (bpm): 84 Weight (lbs): 210 Respiratory Rate (breaths/min): 16 Body Mass Index (BMI): 31 Blood Pressure (mmHg): 158/82 Reference Range: 80 - 120 mg / dl Electronic Signature(s) Signed: 08/20/2021 5:19:32 PM By: Levan Hurst RN, BSN Entered By: Levan Hurst on 08/19/2021 12:38:22

## 2021-08-20 NOTE — Progress Notes (Signed)
[name] ([id number]) Visit Report for 08/19/2021 HPI Details Patient Name: Date of Service: [name], [name] 08/19/2021 12:30 PM Medical Record Number: [id number] Patient Account Number: [id number] Date of Birth/Sex: Treating RN: Dec 27, 1945 (75 y.o. [name] Primary Care Provider: Yaakov Everett Other Clinician: Referring Provider: Treating Provider/Extender: [name] in Treatment: 9 History of Present Illness HPI Description: Admission 6/23 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 He states this started a little over 2 months ago. This is now the third time he has developed a wound to this area. He followed with podiatry for this issue and has been using Betadine daily to it. He recently switched to silver alginate a week ago. He has been on doxycycline since May For this issue and just recently started a course of Keflex prescribed in the ED on 6/18 For possible cellulitis. Currently denies signs of infection. He uses a front offloading shoe to help with relieving the pressure associated with the wound formation. He reports minimal drainage. He does not have sensation to his feet and denies any pain. Currently denies signs of infection. 6/29; this is a patient with chemotherapy-induced neuropathy. We had in clinic last year he was readmitted to our clinic last week. He has an area on the right fifth metatarsal head. Dr. Heber [place] admitted to the to the clinic last week started him on Santyl they are using this daily. The wound started in March she went to podiatry. They are using Betadine to this I am not really sure about the benefit of Betadine in this type of situation. There is not appear to be any active infection. He is using a forefoot offloading boot 7/6; wound on the right fifth metatarsal  head. Using Santyl daily. He is using a forefoot offloading boot but claims to be staying off this religiously Again talk to me about a vacation they had planned for mid August. They have a cancellation insurance and want to go ahead and put this in. I asked him to see if there is a specific form they want filled out. It does seem unlikely that he will be healed by that time [August 12]. Furthermore there are apparently no elevators they be going up stairs. 7/13; right fifth met head. Still not a viable surface using Hydrofera Blue and Santyl. Offloading in a forefoot offloading 7/20; right fifth metatarsal head. Surface looks better. Dimensions down by 4 x 3 mm. We will moved to straight [facility] with a total contact cast today His wife asked me to look at some erythema on the left anterior lower leg. It is indeed warm but not tender [however he is neuropathic]. He also has some degree of venous insufficiency I wonder some of this could be stasis dermatitis 7/22; patient presents for obligatory cast change. He reports not issues with the cast placed earlier this week. He tolerated this well. He has no complaints or questions today. 7/29; the patient has no complaints. The wound actually looks quite good rim of epithelialization nice healthy granulation. But only minimal improvement in size 8/4; patient presents for follow-up. He has no issues or complaints today. He has tolerated the cast well over the past week. He denies signs of infection. 8/5; patient was seen yesterday. He states that the cast was uncomfortable and rubbing at the top. He  is here for evaluation of replacement. 07/28/2021 upon evaluation today patient presents for follow-up he was last seen on the fifth for his last measurement. Fortunately there is no signs of active infection at this time which is great news and overall very pleased with where we stand. With that being said from the starting point of where he is  managing this is definitely showing signs of improvement which is great news there is a little bit of hypergranulation I think silver nitrate could be helpful today. 8/17; wound is contracting nicely on the right fifth metatarsal head. Using [facility] and a total contact cast this is his second go round with this type of wound in this area. We previously had him in this clinic and heal them out lasting about 9 months. He apparently saw Dr. Posey Pronto and Dr. Paulla Dolly for routine and prophylactic nail care. They mention there might be a surgical option for his subluxing metatarsal head in this area 8/24; wound is down slightly in surface area however he has a considerable amount of maceration around the wound extending into the dorsal foot at the base of his toes and even proximally. There is not enough drainage from the small open area currently to do this. I wonder whether he simply had water go down this in the shower although he is using a cast protector. The other thing is I wonder if he is sitting out in the sun and sweating excessively. 8/31; wound continues to go down gradually and looks healthy. They were able to discover that there was a leak in his cast protector creating the macerated skin on the bottom of his foot last week Electronic Signature(s) Signed: 08/19/2021 4:28:25 PM By: Linton Ham MD Entered By: Linton Ham on 08/19/2021 13:20:13 -------------------------------------------------------------------------------- Physical Exam Details Patient Name: Date of Service: Caleb Alken RD C. 08/19/2021 12:30 PM Medical Record Number: [id number] Patient Account Number: [id number] Date of Birth/Sex: Treating RN: 1946/01/14 (75 y.o. [name] Primary Care Provider: Yaakov Everett Other Clinician: Referring Provider: Treating Provider/Extender: [name] in Treatment: 9 Constitutional Patient is hypertensive.. Pulse regular and within target range  for patient.Marland Kitchen Respirations regular, non-labored and within target range.. Temperature is normal and within the target range for the patient.Marland Kitchen Appears in no distress. Notes Wound exam; the wound appears to be very healthy perhaps 2 mm of depth. No mechanical debridement is felt to be necessary this looks healthier than last time. As noted the macerated skin was likely due to a leak in his cast protector Electronic Signature(s) Signed: 08/19/2021 4:28:25 PM By: Linton Ham MD Entered By: Linton Ham on 08/19/2021 13:22:27 -------------------------------------------------------------------------------- Physician Orders Details Patient Name: Date of Service: Caleb Alken RD C. 08/19/2021 12:30 PM Medical Record Number: [id number] Patient Account Number: [id number] Date of Birth/Sex: Treating RN: 05/15/1946 (75 y.o. 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 - with Dr. Dellia Nims Return A Bathing/ Shower/ Hygiene May shower with protection but do not get wound dressing(s) wet. Edema Control - Lymphedema / SCD / Other Elevate legs to the level of the heart  or above for 30 minutes daily and/or when sitting, a frequency of: - throughout the day Avoid standing for long periods of time. Off-Loading Total Contact Cast to Right Lower Extremity Wound Treatment Wound #4 - Foot Wound Laterality: Right, Lateral Cleanser: Soap and Water 1 x Per Week/7  Days Discharge Instructions: May shower and wash wound with dial antibacterial soap and water prior to dressing change. Cleanser: Wound Cleanser (Generic) 1 x Per Week/7 Days Discharge Instructions: Cleanse the wound with wound cleanser prior to applying a clean dressing using gauze sponges, not tissue or cotton balls. Prim Dressing: Promogran Prisma Matrix, 4.34 (sq in) (silver collagen) 1 x Per Week/7 Days ary Discharge Instructions: Moisten collagen with saline or hydrogel Secondary Dressing: Woven Gauze Sponge, Non-Sterile 4x4 in (Generic) 1 x Per Week/7 Days Discharge Instructions: Apply over primary dressing as directed. Secondary Dressing: Drawtex 4x4 in 1 x Per Week/7 Days Discharge Instructions: Apply over primary dressing as directed. Secured With: 49M Medipore H Soft Cloth Surgical Tape, 2x2 (in/yd) (Generic) 1 x Per Week/7 Days Discharge Instructions: Secure dressing with tape as directed. Electronic Signature(s) Signed: 08/19/2021 4:28:25 PM By: Linton Ham MD Signed: 08/20/2021 5:19:32 PM By: Levan Hurst RN, BSN Entered By: Levan Hurst on 08/19/2021 13:05:42 -------------------------------------------------------------------------------- Problem List Details Patient Name: Date of Service: Caleb Alken RD C. 08/19/2021 12:30 PM Medical Record Number: [id number] Patient Account Number: [id number] Date of Birth/Sex: Treating RN: November 05, 1946 (75 y.o. 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 06/11/2021 No Yes J44.9 Chronic obstructive pulmonary disease, unspecified 06/11/2021 No Yes G47.33 Obstructive sleep apnea (adult) (pediatric) 06/11/2021 No Yes Z86.718 Personal history of  other venous thrombosis and embolism 06/11/2021 No Yes L03.116 Cellulitis of left lower limb 07/08/2021 No Yes Inactive Problems Resolved Problems Electronic Signature(s) Signed: 08/19/2021 4:28:25 PM By: Linton Ham MD Entered By: Linton Ham on 08/19/2021 13:19:18 -------------------------------------------------------------------------------- Progress Note Details Patient Name: Date of Service: Caleb Alken RD C. 08/19/2021 12:30 PM Medical Record Number: [id number] Patient Account Number: [id number] Date of Birth/Sex: Treating RN: 1946/05/09 (75 y.o. [name] Primary Care Provider: Yaakov Everett Other Clinician: Referring Provider: Treating Provider/Extender: [name] in Treatment: 9 Subjective History of Present Illness (HPI) Admission 6/23 Mr. 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 He states this started a little over 2 months ago. This is now the third time he has developed a wound to this area. He followed with podiatry for this issue and has been using Betadine daily to it. He recently switched to silver alginate a week ago. He has been on doxycycline since May For this issue and just recently started a course of Keflex prescribed in the ED on 6/18 For possible cellulitis. Currently denies signs of infection. He uses a front offloading shoe to help with relieving the pressure associated with the wound formation. He reports minimal drainage. He does not have sensation to his feet and denies any pain. Currently denies signs of infection. 6/29; this is a patient with chemotherapy-induced neuropathy. We had in clinic last year he was readmitted to our clinic last week. He has an area on the right fifth metatarsal  head. Dr. Heber [place] admitted to the to the clinic last week started him on Santyl they  are using this daily. The wound started in March she went to podiatry. They are using Betadine to this I am not really sure about the benefit of Betadine in this type of situation. There is not appear to be any active infection. He is using a forefoot offloading boot 7/6; wound on the right fifth metatarsal head. Using Santyl daily. He is using a forefoot offloading boot but claims to be staying off this religiously Again talk to me about a vacation they had planned for mid August. They have a cancellation insurance and want to go ahead and put this in. I asked him to see if there is a specific form they want filled out. It does seem unlikely that he will be healed by that time [August 12]. Furthermore there are apparently no elevators they be going up stairs. 7/13; right fifth met head. Still not a viable surface using Hydrofera Blue and Santyl. Offloading in a forefoot offloading 7/20; right fifth metatarsal head. Surface looks better. Dimensions down by 4 x 3 mm. We will moved to straight [facility] with a total contact cast today His wife asked me to look at some erythema on the left anterior lower leg. It is indeed warm but not tender [however he is neuropathic]. He also has some degree of venous insufficiency I wonder some of this could be stasis dermatitis 7/22; patient presents for obligatory cast change. He reports not issues with the cast placed earlier this week. He tolerated this well. He has no complaints or questions today. 7/29; the patient has no complaints. The wound actually looks quite good rim of epithelialization nice healthy granulation. But only minimal improvement in size 8/4; patient presents for follow-up. He has no issues or complaints today. He has tolerated the cast well over the past week. He denies signs of infection. 8/5; patient was seen yesterday. He states that the cast was uncomfortable and rubbing at the top. He is here for evaluation of replacement. 07/28/2021  upon evaluation today patient presents for follow-up he was last seen on the fifth for his last measurement. Fortunately there is no signs of active infection at this time which is great news and overall very pleased with where we stand. With that being said from the starting point of where he is managing this is definitely showing signs of improvement which is great news there is a little bit of hypergranulation I think silver nitrate could be helpful today. 8/17; wound is contracting nicely on the right fifth metatarsal head. Using [facility] and a total contact cast this is his second go round with this type of wound in this area. We previously had him in this clinic and heal them out lasting about 9 months. He apparently saw Dr. Posey Pronto and Dr. Paulla Dolly for routine and prophylactic nail care. They mention there might be a surgical option for his subluxing metatarsal head in this area 8/24; wound is down slightly in surface area however he has a considerable amount of maceration around the wound extending into the dorsal foot at the base of his toes and even proximally. There is not enough drainage from the small open area currently to do this. I wonder whether he simply had water go down this in the shower although he is using a cast protector. The other thing is I wonder if he is sitting out in the sun and sweating excessively. 8/31;  wound continues to go down gradually and looks healthy. They were able to discover that there was a leak in his cast protector creating the macerated skin on the bottom of his foot last week Objective Constitutional Patient is hypertensive.. Pulse regular and within target range for patient.Marland Kitchen Respirations regular, non-labored and within target range.. Temperature is normal and within the target range for the patient.Marland Kitchen Appears in no distress. Vitals Time Taken: 12:37 PM, Height: 69 in, Weight: 210 lbs, BMI: 31, Temperature: 97.7 F, Pulse: 84 bpm, Respiratory Rate: 16  breaths/min, Blood Pressure: 158/82 mmHg. General Notes: Wound exam; the wound appears to be very healthy perhaps 2 mm of depth. No mechanical debridement is felt to be necessary this looks healthier than last time. As noted the macerated skin was likely due to a leak in his cast protector Integumentary (Hair, Skin) Wound #4 status is Open. Original cause of wound was Gradually Appeared. The date acquired was: 03/30/2021. The wound has been in treatment 9 weeks. The wound is located on the Right,Lateral Foot. The wound measures 0.4cm length x 0.9cm width x 0.3cm depth; 0.283cm^2 area and 0.085cm^3 volume. There is Fat Layer (Subcutaneous Tissue) exposed. There is no tunneling noted, however, there is undermining starting at 6:00 and ending at 9:00 with a maximum distance of 0.2cm. There is a medium amount of serosanguineous drainage noted. The wound margin is distinct with the outline attached to the wound base. There is large (67-100%) red, pink granulation within the wound bed. There is no necrotic tissue within the wound bed. 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 There was a T Contact Cast Procedure by Deno Etienne., MD. Post procedure Diagnosis Wound #4: Same as Pre-Procedure Everett Follow-up Appointments: Return Appointment in 1 week. - with Dr. Arcola Jansky Shower/ Hygiene: May shower with protection but do not get wound dressing(s) wet. Edema Control - Lymphedema / SCD / Other: Elevate legs to the level of the heart or above for 30 minutes daily and/or when sitting, a frequency of: -  throughout the day Avoid standing for long periods of time. Off-Loading: T Contact Cast to Right Lower Extremity otal WOUND #4: - Foot Wound Laterality: Right, Lateral Cleanser: Soap and Water 1 x Per Week/7 Days Discharge Instructions: May shower and wash wound with dial antibacterial soap and water prior to dressing change. Cleanser: Wound Cleanser (Generic) 1 x Per Week/7 Days Discharge Instructions: Cleanse the wound with wound cleanser prior to applying a clean dressing using gauze sponges, not tissue or cotton balls. Prim Dressing: Promogran Prisma Matrix, 4.34 (sq in) (silver collagen) 1 x Per Week/7 Days ary Discharge Instructions: Moisten collagen with saline or hydrogel Secondary Dressing: Woven Gauze Sponge, Non-Sterile 4x4 in (Generic) 1 x Per Week/7 Days Discharge Instructions: Apply over primary dressing as directed. Secondary Dressing: Drawtex 4x4 in 1 x Per Week/7 Days Discharge Instructions: Apply over primary dressing as directed. Secured With: 22M Medipore H Soft Cloth Surgical T ape, 2x2 (in/yd) (Generic) 1 x Per Week/7 Days Discharge Instructions: Secure dressing with tape as directed. 1. Wound surface looks really healthy on the right lateral foot 3. Change the primary dressing to silver collagen from [facility] still under a  total contact cast Electronic Signature(s) Signed: 08/19/2021 4:28:25 PM By: Linton Ham MD Entered By: Linton Ham on 08/19/2021 13:25:23 -------------------------------------------------------------------------------- Total Contact Cast Details Patient Name: Date of Service: Caleb Alken RD C. 08/19/2021 12:30 PM Medical Record Number: [id number] Patient Account Number: [id number] Date of Birth/Sex: Treating RN: November 20, 1946 (75 y.o. [name] Primary Care Provider: Yaakov Everett Other Clinician: Referring Provider: Treating Provider/Extender: [name] in Treatment: 9 T Contact Cast Applied for  Wound Assessment: otal Wound #4 Right,Lateral Foot Performed By: Physician Ricard Dillon., MD Post Procedure Diagnosis Same as Pre-procedure Electronic Signature(s) Signed: 08/19/2021 4:28:25 PM By: Linton Ham MD Entered By: Linton Ham on 08/19/2021 13:19:39 -------------------------------------------------------------------------------- SuperBill Details Patient Name: Date of Service: Caleb Alken RD C. 08/19/2021 Medical Record Number: [id number] Patient Account Number: [id number] Date of Birth/Sex: Treating RN: Mar 25, 1946 (75 y.o. 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 J44.9 Chronic obstructive pulmonary disease, unspecified G47.33 Obstructive sleep apnea (adult) (pediatric) Z86.718 Personal history of other venous thrombosis and embolism L03.116 Cellulitis of left lower limb Facility Procedures CPT4 Code: [id number] Description: [id number] - APPLY TOTAL CONTACT LEG CAST ICD-10 Diagnosis Description 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

## 2021-08-26 ENCOUNTER — Other Ambulatory Visit: Payer: Self-pay

## 2021-08-26 ENCOUNTER — Encounter (HOSPITAL_BASED_OUTPATIENT_CLINIC_OR_DEPARTMENT_OTHER): Payer: Medicare Other | Attending: Internal Medicine | Admitting: Internal Medicine

## 2021-08-26 DIAGNOSIS — Z86718 Personal history of other venous thrombosis and embolism: Secondary | ICD-10-CM | POA: Insufficient documentation

## 2021-08-26 DIAGNOSIS — G620 Drug-induced polyneuropathy: Secondary | ICD-10-CM | POA: Diagnosis not present

## 2021-08-26 DIAGNOSIS — L97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 with unspecified severity: Secondary | ICD-10-CM | POA: Diagnosis present

## 2021-08-26 DIAGNOSIS — I10 Essential (primary) hypertension: Secondary | ICD-10-CM | POA: Insufficient documentation

## 2021-08-26 DIAGNOSIS — G4733 Obstructive sleep apnea (adult) (pediatric): Secondary | ICD-10-CM | POA: Diagnosis not present

## 2021-08-26 DIAGNOSIS — J449 Chronic obstructive pulmonary disease, unspecified: Secondary | ICD-10-CM | POA: Diagnosis not present

## 2021-08-26 DIAGNOSIS — Z85118 Personal history of other malignant neoplasm of bronchus and lung: Secondary | ICD-10-CM | POA: Insufficient documentation

## 2021-08-26 DIAGNOSIS — Z9221 Personal history of antineoplastic chemotherapy: Secondary | ICD-10-CM | POA: Diagnosis not present

## 2021-08-26 DIAGNOSIS — L03116 Cellulitis of left lower limb: Secondary | ICD-10-CM | POA: Diagnosis not present

## 2021-08-26 NOTE — Progress Notes (Signed)
[name], [name] ([id number]) Visit Report for 08/26/2021 Debridement Details Patient Name: Date of Service: [name], Caleb RD C. 08/26/2021 12:45 PM Medical Record Number: [id number] Patient Account Number: [id number] Date of Birth/Sex: Treating RN: [birth year] (75 y.o. [name] Primary Care Provider: Yaakov Guthrie Other Clinician: Referring Provider: Treating Provider/Extender: [name] in Treatment: 10 Debridement Performed for Assessment: Wound #4 Right,Lateral Foot Performed By: Physician Ricard Dillon., MD Debridement Type: Debridement Level of Consciousness (Pre-procedure): Awake and Alert Pre-procedure Verification/Time Out Yes - 13:18 Taken: Start Time: 13:18 T Area Debrided (L x W): otal 1 (cm) x 1 (cm) = 1 (cm) Tissue and other material debrided: Viable, Non-Viable, Callus, Eschar, Subcutaneous Level: Skin/Subcutaneous Tissue Debridement Description: Excisional Instrument: Curette Specimen: Swab, Number of Specimens T aken: 1 Bleeding: Minimum Hemostasis Achieved: Pressure End Time: 13:20 Procedural Pain: 0 Post Procedural Pain: 0 Response to Treatment: Procedure was tolerated well Level of Consciousness (Post- Awake and Alert procedure): Post Debridement Measurements of Total Wound Length: (cm) 0.3 Width: (cm) 0.6 Depth: (cm) 0.3 Volume: (cm) 0.042 Character of Wound/Ulcer Post Debridement: Stable Post Procedure Diagnosis Same as Pre-procedure Electronic Signature(s) Signed: 08/26/2021 3:40:46 PM By: Linton Ham MD Signed: 08/26/2021 5:49:01 PM By: Levan Hurst RN, BSN Entered By: Linton Ham on 08/26/2021 13:40:43 -------------------------------------------------------------------------------- HPI Details Patient Name: Date of Service: [name] RD C. 08/26/2021 12:45 PM Medical Record Number: [id number] Patient Account Number: [id number] Date of Birth/Sex: Treating RN: 06/20/1946 (75 y.o. 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 He states this started a little over 2 months ago. This is now the third time he has developed a wound to this area. He followed with podiatry for this issue and has been using Betadine daily to it. He recently switched to silver alginate a week ago. He has been on doxycycline since May For this issue and just recently started a course of Keflex prescribed in the ED on 6/18 For possible cellulitis. Currently denies signs of infection. He uses a front offloading shoe to help with relieving the pressure associated with the wound formation. He reports minimal drainage. He does not have sensation to his feet and denies any pain. Currently denies signs of infection. 6/29; this is a patient with chemotherapy-induced neuropathy. We had in clinic last year he was readmitted to our clinic last week. He has an area on the right fifth metatarsal head. Dr. Heber [place] admitted to the to the clinic last week started him on Santyl they are using this daily. The wound started in March she went to podiatry. They are using Betadine to this I am not really sure about the benefit of Betadine in this type of situation. There is not appear to be any active infection. He is using a forefoot offloading boot 7/6; wound on the right fifth metatarsal head. Using Santyl daily. He is using a forefoot offloading boot but claims to be staying off this religiously Again talk to me about a vacation they had planned for mid August. They have a cancellation insurance and want to go ahead and put this in. I asked him to see if  there is a specific form they want  filled out. It does seem unlikely that he will be healed by that time [August 12]. Furthermore there are apparently no elevators they be going up stairs. 7/13; right fifth met head. Still not a viable surface using Hydrofera Blue and Santyl. Offloading in a forefoot offloading 7/20; right fifth metatarsal head. Surface looks better. Dimensions down by 4 x 3 mm. We will moved to straight [facility] with a total contact cast today His wife asked me to look at some erythema on the left anterior lower leg. It is indeed warm but not tender [however he is neuropathic]. He also has some degree of venous insufficiency I wonder some of this could be stasis dermatitis 7/22; patient presents for obligatory cast change. He reports not issues with the cast placed earlier this week. He tolerated this well. He has no complaints or questions today. 7/29; the patient has no complaints. The wound actually looks quite good rim of epithelialization nice healthy granulation. But only minimal improvement in size 8/4; patient presents for follow-up. He has no issues or complaints today. He has tolerated the cast well over the past week. He denies signs of infection. 8/5; patient was seen yesterday. He states that the cast was uncomfortable and rubbing at the top. He is here for evaluation of replacement. 07/28/2021 upon evaluation today patient presents for follow-up he was last seen on the fifth for his last measurement. Fortunately there is no signs of active infection at this time which is great news and overall very pleased with where we stand. With that being said from the starting point of where he is managing this is definitely showing signs of improvement which is great news there is a little bit of hypergranulation I think silver nitrate could be helpful today. 8/17; wound is contracting nicely on the right fifth metatarsal head. Using [facility] and a total contact cast  this is his second go round with this type of wound in this area. We previously had him in this clinic and heal them out lasting about 9 months. He apparently saw Dr. Posey Pronto and Dr. Paulla Dolly for routine and prophylactic nail care. They mention there might be a surgical option for his subluxing metatarsal head in this area 8/24; wound is down slightly in surface area however he has a considerable amount of maceration around the wound extending into the dorsal foot at the base of his toes and even proximally. There is not enough drainage from the small open area currently to do this. I wonder whether he simply had water go down this in the shower although he is using a cast protector. The other thing is I wonder if he is sitting out in the sun and sweating excessively. 8/31; wound continues to go down gradually and looks healthy. They were able to discover that there was a leak in his cast protector creating the macerated skin on the bottom of his foot last week 9/7; arrives today with the wound measuring smaller however at bedside exam there was clearly extension laterally subcutaneously with a large amount of purulence relative to the wound size. I used a #5 curette to clean up the wound callus and subcutaneous debris. A culture of the purulent drainage coming predominantly laterally over the fifth metatarsal head was obtained. We have not cultured this wound before. He came into the clinic on doxycycline. He tells me that he cannot take pills orally he has to either crush them open capsules or take suspensions. I am not sure you  can do this with doxycycline however Electronic Signature(s) Signed: 08/26/2021 3:40:46 PM By: Linton Ham MD Entered By: Linton Ham on 08/26/2021 13:42:11 -------------------------------------------------------------------------------- Physical Exam Details Patient Name: Date of Service: [name] RD C. 08/26/2021 12:45 PM Medical Record Number: [id number] Patient  Account Number: [id number] Date of Birth/Sex: Treating RN: [birth year] (75 y.o. [name] Primary Care Provider: Yaakov Guthrie Other Clinician: Referring Provider: Treating Provider/Extender: [name] in Treatment: 10 Constitutional Sitting or standing Blood Pressure is within target range for patient.. Pulse regular and within target range for patient.Marland Kitchen Respirations regular, non-labored and within target range.. Temperature is normal and within the target range for the patient.Marland Kitchen Appears in no distress. Notes Wound exam; the wound appears to be healthy when first looked out however laterally there was clearly some purulent drainage and the infection extended laterally towards the lateral part of the fifth metatarsal head. Draining area from this area as well. Specimen obtained for culture. I used a #5 curette to remove the callus subcutaneous tissue from the lateral part of the wound which fortunately still appears to be health Electronic Signature(s) Signed: 08/26/2021 3:40:46 PM By: Linton Ham MD Signed: 08/26/2021 3:40:46 PM By: Linton Ham MD Entered By: Linton Ham on 08/26/2021 13:43:44 -------------------------------------------------------------------------------- Physician Orders Details Patient Name: Date of Service: [name] RD C. 08/26/2021 12:45 PM Medical Record Number: [id number] Patient Account Number: [id number] Date of Birth/Sex: Treating RN: 1946-09-30 (75 y.o. 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 - with Dr. Dellia Nims Return A Bathing/ Shower/ Hygiene May shower with protection but do not get wound dressing(s) wet. Edema Control - Lymphedema / SCD / Other Elevate legs to the level of the heart or above for 30 minutes daily and/or when sitting, a frequency of: - throughout the day Avoid standing for long periods of time. Off-Loading Open toe surgical shoe to: - right foot Wound Treatment Wound #4 - Foot Wound Laterality: Right, Lateral Cleanser: Soap and Water 1 x Per Day/7 Days Discharge Instructions: May shower and wash wound with dial antibacterial soap and water prior to dressing change. Cleanser: Wound Cleanser 1 x Per Day/7 Days Discharge Instructions: Cleanse the wound with wound cleanser prior to applying a clean dressing using gauze sponges, not tissue or cotton balls. Prim Dressing: KerraCel Ag Gelling Fiber Dressing, 2x2 in (silver alginate) 1 x Per Day/7 Days ary Discharge Instructions: Apply silver alginate to wound bed as instructed Secondary Dressing: Woven Gauze Sponge, Non-Sterile 4x4 in 1 x Per Day/7 Days Discharge Instructions: Apply over primary dressing as directed. Secondary Dressing: Optifoam Non-Adhesive Dressing, 4x4 in 1 x Per Day/7 Days Discharge Instructions: foam donut Secured With: Conforming Stretch Gauze Bandage, Sterile 2x75 (in/in) 1 x Per Day/7 Days Discharge Instructions: Secure with stretch gauze as directed. Secured With: 42M Medipore H Soft Cloth Surgical Tape, 2x2 (in/yd) 1 x Per Day/7 Days Discharge Instructions: Secure dressing with tape as directed. Laboratory naerobe culture (MICRO) - right lateral foot - (ICD10 L97.519 - Non-pressure chronic ulcer of Bacteria identified in Unspecified specimen by A other part of right foot  with unspecified severity) LOINC Code: 277-8 Convenience Name: Anerobic  culture Patient Medications llergies: No Known Allergies A Notifications Medication Indication Start End wound infection 08/26/2021 Augmentin DOSE oral 250 mg/5 mL-62.5 mg/5 mL suspension for reconstitution - suspension for reconstitution oral 9m (500MG) tid for 7 days Electronic Signature(s) Signed: 08/26/2021 1:50:19 PM By: RLinton HamMD Entered By: RLinton Hamon 08/26/2021 13:50:19 -------------------------------------------------------------------------------- Problem List Details Patient Name: Date of Service: SShayne AlkenRD C. 08/26/2021 12:45 PM Medical Record Number: [id number] Account Number: [id number] of Birth/Sex: Treating RN: 1Mar 27, 1947(75y.o. 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 06/11/2021 No Yes J44.9 Chronic obstructive pulmonary disease, unspecified 06/11/2021 No Yes G47.33 Obstructive sleep apnea (adult) (pediatric) 06/11/2021 No Yes Z86.718 Personal history of other venous thrombosis and embolism 06/11/2021 No Yes L03.116 Cellulitis of left lower limb 07/08/2021 No Yes Inactive Problems Resolved Problems Electronic Signature(s) Signed: 08/26/2021 3:40:46 PM By: RLinton HamMD Entered By: RLinton Hamon 08/26/2021 13:40:19 -------------------------------------------------------------------------------- Progress Note Details Patient Name: Date of Service: SShayne AlkenRD C. 08/26/2021 12:45 PM Medical Record Number: [id number] Account Number: [id number] of Birth/Sex: Treating RN: 102-12-1945(75y.o. MJanyth ContesPrimary Care Provider: WYaakov GuthrieOther Clinician: Referring Provider: Treating Provider/Extender: RFuller Planin Treatment: 10 Subjective History of Present Illness (HPI) Admission 6/23 Mr. 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 He states this started a little over 2 months ago. This is now the third time he has developed a wound to this area. He followed with podiatry for this issue and has been using Betadine daily to it. He recently switched to silver alginate a week ago. He has been on doxycycline since May For this issue and just recently started a course of Keflex prescribed in the ED on 6/18 For possible cellulitis. Currently denies signs of infection. He uses a front offloading shoe to help with relieving the pressure associated with the wound formation. He reports minimal drainage. He does not have sensation to his feet and denies any pain. Currently denies signs of infection. 6/29; this is a patient with chemotherapy-induced neuropathy. We had in clinic last year he was readmitted to our clinic last week. He has an area on the right fifth metatarsal head. Dr. HHeber Carolinaadmitted to the to the clinic last week started him on Santyl they are using this daily. The wound started in March she went to podiatry. They are using Betadine to this I am not really sure about the benefit of Betadine in this type of situation. There is not appear to be any active infection. He is using a forefoot offloading boot 7/6; wound on the right fifth metatarsal head. Using Santyl daily. He is using a forefoot offloading boot but claims to be staying off this religiously Again talk to me about a vacation they had planned for mid August. They have a cancellation insurance and want to go ahead and put this in. I asked him to see if there is a specific form they want filled out. It does seem unlikely that he will be healed by that time [August 12].  Furthermore there are apparently no elevators they be going up stairs. 7/13; right fifth met head. Still not a viable surface using Hydrofera Blue and Santyl. Offloading in a forefoot offloading 7/20; right fifth metatarsal head. Surface looks better. Dimensions down by 4 x 3 mm. We will moved to straight [facility] with a total contact cast today His wife asked me to look at some erythema on the left anterior lower leg. It is indeed warm but not tender [however he is neuropathic]. He also has some degree of venous insufficiency I wonder some of this could be stasis dermatitis 7/22; patient presents for obligatory cast change. He reports not issues with the cast placed earlier this week. He tolerated this well. He has no complaints or questions today. 7/29; the patient has no complaints. The wound actually looks quite good rim of epithelialization nice healthy granulation. But only minimal improvement in size 8/4; patient presents for follow-up. He has no issues or complaints today. He has tolerated the cast well over the past week. He denies signs of infection. 8/5; patient was seen yesterday. He states that the cast was uncomfortable and rubbing at the top. He is here for evaluation of replacement. 07/28/2021 upon evaluation today patient presents for follow-up he was last seen on the fifth for his last measurement. Fortunately there is no signs of active infection at this time which is great news and overall very pleased with where we stand. With that being said from the starting point of where he is managing this is definitely showing signs of improvement which is great news there is a little bit of hypergranulation I think silver nitrate could be helpful today. 8/17; wound is contracting nicely on the right fifth metatarsal head. Using [facility] and a total contact cast this is his second go round with this type of wound in this area. We previously had him in this clinic and heal them out  lasting about 9 months. He apparently saw Dr. Posey Pronto and Dr. Paulla Dolly for routine and prophylactic nail care. They mention there might be a surgical option for his subluxing metatarsal head in this area 8/24; wound is down slightly in surface area however he has a considerable amount of maceration around the wound extending into the dorsal foot at the base of his toes and even proximally. There is not enough drainage from the small open area currently to do this. I wonder whether he simply had water go down this in the shower although he is using a cast protector. The other thing is I wonder if he is sitting out in the sun and sweating excessively. 8/31; wound continues to go down gradually and looks healthy. They were able to discover that there was a leak in his cast protector creating the macerated skin on the bottom of his foot last week 9/7; arrives today with the wound measuring smaller however at bedside exam there was clearly extension laterally subcutaneously with a large amount of purulence relative to the wound size. I used a #5 curette to clean up the wound callus and subcutaneous debris. A culture of the purulent drainage coming predominantly laterally over the fifth metatarsal head was obtained. We have not cultured this wound before. He came into the clinic on doxycycline. He tells me that he cannot take pills orally he has to either crush them open capsules or take suspensions. I am not sure you can do this with doxycycline however Objective Constitutional Sitting or standing Blood Pressure is within target  range for patient.. Pulse regular and within target range for patient.Marland Kitchen Respirations regular, non-labored and within target range.. Temperature is normal and within the target range for the patient.Marland Kitchen Appears in no distress. Vitals Time Taken: 12:45 PM, Height: 69 in, Weight: 210 lbs, BMI: 31, Temperature: 98.0 F, Pulse: 99 bpm, Respiratory Rate: 16 breaths/min, Blood  Pressure: 136/83 mmHg. General Notes: Wound exam; the wound appears to be healthy when first looked out however laterally there was clearly some purulent drainage and the infection extended laterally towards the lateral part of the fifth metatarsal head. Draining area from this area as well. Specimen obtained for culture. I used a #5 curette to remove the callus subcutaneous tissue from the lateral part of the wound which fortunately still appears to be health Integumentary (Hair, Skin) Wound #4 status is Open. Original cause of wound was Gradually Appeared. The date acquired was: 03/30/2021. The wound has been in treatment 10 weeks. The wound is located on the Right,Lateral Foot. The wound measures 0.3cm length x 0.6cm width x 0.3cm depth; 0.141cm^2 area and 0.042cm^3 volume. There is Fat Layer (Subcutaneous Tissue) exposed. There is no tunneling noted, however, there is undermining starting at 12:00 and ending at 12:00 with a maximum distance of 0.2cm. There is a medium amount of serosanguineous drainage noted. The wound margin is distinct with the outline attached to the wound base. There is large (67-100%) red, pink granulation within the wound bed. There is no necrotic tissue within the wound bed. 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 There was a Excisional Skin/Subcutaneous Tissue Debridement with a total area of 1 sq cm performed by Ricard Dillon., MD. With the following instrument(s): Curette to remove Viable and Non-Viable tissue/material. Material removed includes Eschar, Callus, and Subcutaneous  Tissue. 1 specimen was taken by a Swab and sent to the lab per facility protocol. A time out was conducted at 13:18, prior to the start of the procedure. A Minimum amount of bleeding was controlled with Pressure. The procedure was tolerated well with a pain level of 0 throughout and a pain level of 0 following the procedure. Post Debridement Measurements: 0.3cm length x 0.6cm width x 0.3cm depth; 0.042cm^3 volume. Character of Wound/Ulcer Post Debridement is stable. Post procedure Diagnosis Wound #4: Same as Pre-Procedure Plan Follow-up Appointments: Return Appointment in 1 week. - with Dr. Arcola Jansky Shower/ Hygiene: May shower with protection but do not get wound dressing(s) wet. Edema Control - Lymphedema / SCD / Other: Elevate legs to the level of the heart or above for 30 minutes daily and/or when sitting, a frequency of: - throughout the day Avoid standing for long periods of time. Off-Loading: Open toe surgical shoe to: - right foot Laboratory ordered were: Anerobic culture - right lateral foot The following medication(s) was prescribed: Augmentin oral 250 mg/5 mL-62.5 mg/5 mL suspension for reconstitution suspension for reconstitution oral 72m (500MG) tid for 7 days for wound infection starting 08/26/2021 WOUND #4: - Foot Wound Laterality: Right, Lateral Cleanser: Soap and Water 1 x Per Day/7 Days Discharge Instructions: May shower and wash wound with dial antibacterial soap and water prior to dressing change. Cleanser: Wound Cleanser 1 x Per Day/7 Days Discharge Instructions: Cleanse  the wound with wound cleanser prior to applying a clean dressing using gauze sponges, not tissue or cotton balls. Prim Dressing: KerraCel Ag Gelling Fiber Dressing, 2x2 in (silver alginate) 1 x Per Day/7 Days ary Discharge Instructions: Apply silver alginate to wound bed as instructed Secondary Dressing: Woven Gauze Sponge, Non-Sterile 4x4 in 1 x Per Day/7 Days Discharge Instructions: Apply over  primary dressing as directed. Secondary Dressing: Optifoam Non-Adhesive Dressing, 4x4 in 1 x Per Day/7 Days Discharge Instructions: foam donut Secured With: Conforming Stretch Gauze Bandage, Sterile 2x75 (in/in) 1 x Per Day/7 Days Discharge Instructions: Secure with stretch gauze as directed. Secured With: 9M Medipore H Soft Cloth Surgical T ape, 2x2 (in/yd) 1 x Per Day/7 Days Discharge Instructions: Secure dressing with tape as directed. 1. Specimen for CandS andempiric Augmentin suspension. I note the Coumadin however just about all drugs interact and there is not any other choice here other than to give him instructions to monitor for bleed 2. Silver alginate to this entire area. 3. T him out of the total contact cast which is unfortunate into a healing sandal. ook Electronic Signature(s) Signed: 08/26/2021 3:40:46 PM By: Linton Ham MD Entered By: Linton Ham on 08/26/2021 13:52:05 -------------------------------------------------------------------------------- SuperBill Details Patient Name: Date of Service: [name] RD C. 08/26/2021 Medical Record Number: [id number] Patient Account Number: [id number] Date of Birth/Sex: Treating RN: [birth year] (75 y.o. 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.9 Chronic obstructive pulmonary disease, unspecified G47.33 Obstructive sleep apnea (adult) (pediatric) Z86.718 Personal history of other venous thrombosis and embolism L03.116 Cellulitis of left lower limb Facility Procedures CPT4 Code: [id number] Description: 03212 - DEB SUBQ TISSUE 20 SQ CM/< ICD-10 Diagnosis Description L97.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 with unspecified  severi Modifier: ty Quantity: 1 Physician Procedures : CPT4 Code Description Modifier [id number] - WC PHYS SUBQ TISS 20 SQ CM ICD-10 Diagnosis Description L97.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

## 2021-08-26 NOTE — Progress Notes (Signed)
ISSAIH, KAUS ([id number]) Visit Report for 08/26/2021 Arrival Information Details Patient Name: Date of Service: [name], [name] RD C. 08/26/2021 12:45 PM Medical Record Number: [id number] Patient Account Number: [id number] Date of Birth/Sex: Treating RN: February 09, 1946 (75 y.o. [name] Primary Care Tatjana Turcott: Yaakov Guthrie Other Clinician: Referring Celia Friedland: Treating Waunetta Riggle/Extender: [name] in Treatment: 10 Visit Information History Since Last Visit Added or deleted any medications: No Patient Arrived: Ambulatory Any new allergies or adverse reactions: No Arrival Time: 12:45 Had a fall or experienced change in No Accompanied By: spouse activities of daily living that may affect Transfer Assistance: None risk of falls: Patient Identification Verified: Yes Signs or symptoms of abuse/neglect since last visito No Secondary Verification Process Completed: Yes Hospitalized since last visit: No Patient Requires Transmission-Based Precautions: No Implantable device outside of the clinic excluding No Patient Has Alerts: Yes cellular tissue based products placed in the center Patient Alerts: Patient on Blood Thinner since last visit: R ABI: 1.01 TBI: 0.77 Has Dressing in Place as Prescribed: Yes L ABI: 1.22 TBI: 0.87 Has Footwear/Offloading in Place as Prescribed: Yes 05/01/21 Right: T Contact Cast otal Pain Present Now: No Electronic Signature(s) Signed: 08/26/2021 5:49:01 PM By: Levan Hurst RN, BSN Entered By: Levan Hurst on 08/26/2021 12:45:38 -------------------------------------------------------------------------------- Encounter Discharge Information Details Patient Name: Date of Service: Caleb Alken RD C. 08/26/2021 12:45 PM Medical Record Number: [id number] Patient Account Number: [id number] Date of Birth/Sex: Treating RN: 10/12/1946 (75 y.o. [name] Primary Care Amar Keenum: Yaakov Guthrie Other Clinician: Referring  Emoni Whitworth: Treating Mihira Tozzi/Extender: [name] in Treatment: 10 Encounter Discharge Information Items Post Procedure Vitals Discharge Condition: Stable Temperature (F): 98.0 Ambulatory Status: Ambulatory Pulse (bpm): 99 Discharge Destination: Home Respiratory Rate (breaths/min): 16 Transportation: Private Auto Blood Pressure (mmHg): 136/83 Accompanied By: spouse Schedule Follow-up Appointment: Yes Clinical Summary of Care: Patient Declined Electronic Signature(s) Signed: 08/26/2021 5:49:01 PM By: Levan Hurst RN, BSN Entered By: Levan Hurst on 08/26/2021 14:45:51 -------------------------------------------------------------------------------- Lower Extremity Assessment Details Patient Name: Date of Service: Caleb Alken RD C. 08/26/2021 12:45 PM Medical Record Number: [id number] Patient Account Number: [id number] Date of Birth/Sex: Treating RN: 01-04-1946 (75 y.o. [name] Primary Care Jahni Paul: Yaakov Guthrie Other Clinician: Referring Nature Vogelsang: Treating Buchanan Shellhammer/Extender: [name] in Treatment: 10 Edema Assessment Assessed: Shirlyn Goltz: No] Patrice Paradise: No] Edema: [Left: N] [Right: o] Calf Left: Right: Point of Measurement: 34 cm From Medial Instep 37 cm Ankle Left: Right: Point of Measurement: 13 cm From Medial Instep 23 cm Vascular Assessment Pulses: Dorsalis Pedis Palpable: [Right:Yes] Electronic Signature(s) Signed: 08/26/2021 5:49:01 PM By: Levan Hurst RN, BSN Entered By: Levan Hurst on 08/26/2021 13:06:15 -------------------------------------------------------------------------------- Multi Wound Chart Details Patient Name: Date of Service: Caleb Alken RD C. 08/26/2021 12:45 PM Medical Record Number: [id number] Patient Account Number: [id number] Date of Birth/Sex: Treating RN: 26-Mar-1946 (75 y.o. [name] Primary Care Nthony Lefferts: Yaakov Guthrie Other Clinician: Referring Mardi Cannady: Treating  Diangelo Radel/Extender: [name] in Treatment: 10 Vital Signs Height(in): 40 Pulse(bpm): 76 Weight(lbs): 210 Blood Pressure(mmHg): 136/83 Body Mass Index(BMI): 31 Temperature(F): 98.0 Respiratory Rate(breaths/min): 16 Photos: [4:Right, Lateral Foot] [N/A:N/A N/A] Wound Location: [4:Gradually Appeared] [N/A:N/A] Wounding Event: [4:Neuropathic Ulcer-Non Diabetic] [N/A:N/A] Primary Etiology: [4:Glaucoma, Chronic Obstructive] [N/A:N/A] Comorbid History: [4:Pulmonary Disease (COPD), Sleep Apnea, Hypertension, Neuropathy, Received Chemotherapy, Received Radiation 03/30/2021] [N/A:N/A] Date Acquired: [4:10] [N/A:N/A] Weeks of Treatment: [4:Open] [N/A:N/A] Wound Status: [4:0.3x0.6x0.3] [N/A:N/A] Measurements L x W x D (cm) [4:0.141] [N/A:N/A] A (cm) : rea [4:0.042] [N/A:N/A] Volume (  cm) : [4:97.40%] [N/A:N/A] % Reduction in A [4:rea: 92.40%] [N/A:N/A] % Reduction in Volume: [4:12] Starting Position 1 (o'clock): [4:12] Ending Position 1 (o'clock): [4:0.2] Maximum Distance 1 (cm): [4:Yes] [N/A:N/A] Undermining: [4:Full Thickness Without Exposed] [N/A:N/A] Classification: [4:Support Structures Medium] [N/A:N/A] Exudate A mount: [4:Serosanguineous] [N/A:N/A] Exudate Type: [4:red, brown] [N/A:N/A] Exudate Color: [4:Distinct, outline attached] [N/A:N/A] Wound Margin: [4:Large (67-100%)] [N/A:N/A] Granulation A mount: [4:Red, Pink] [N/A:N/A] Granulation Quality: [4:None Present (0%)] [N/A:N/A] Necrotic A mount: [4:Fat Layer (Subcutaneous Tissue): Yes N/A] Exposed Structures: [4:Fascia: No Tendon: No Muscle: No Joint: No Bone: No Medium (34-66%)] [N/A:N/A] Epithelialization: [4:Debridement - Excisional] [N/A:N/A] Debridement: Pre-procedure Verification/Time Out 13:18 [N/A:N/A] Taken: [4:Callus, Subcutaneous] [N/A:N/A] Tissue Debrided: [4:Skin/Subcutaneous Tissue] [N/A:N/A] Level: [4:1] [N/A:N/A] Debridement A (sq cm): [4:rea Curette] [N/A:N/A] Instrument:  [4:Swab] [N/A:N/A] Specimen: [4:1] [N/A:N/A] Number of Specimens Taken: [4:Minimum] [N/A:N/A] Bleeding: [4:Pressure] [N/A:N/A] Hemostasis A chieved: [4:0] [N/A:N/A] Procedural Pain: [4:0] [N/A:N/A] Post Procedural Pain: [4:Procedure was tolerated well] [N/A:N/A] Debridement Treatment Response: [4:0.3x0.6x0.3] [N/A:N/A] Post Debridement Measurements L x W x D (cm) [4:0.042] [N/A:N/A] Post Debridement Volume: (cm) [4:Debridement] [N/A:N/A] Treatment Notes Electronic Signature(s) Signed: 08/26/2021 3:40:46 PM By: Robson, Michael MD Signed: 08/26/2021 5:49:01 PM By: Lynch, Shatara RN, BSN Entered By: Robson, Michael on 08/26/2021 13:40:28 -------------------------------------------------------------------------------- Multi-Disciplinary Care Plan Details Patient Name: Date of Service: Bargo, Caleb RD C. 08/26/2021 12:45 PM Medical Record Number: [id number] Patient Account Number: [id number] Date of Birth/Sex: Treating RN: 02/17/1946 (75 y.o. M) Lynch, Shatara Primary Care Provider: Wong, Francis Other Clinician: Referring Provider: Treating Provider/Extender: Robson, Michael Wong, Francis Weeks in Treatment: 10 Active Inactive Wound/Skin Impairment Nursing Diagnoses: Impaired tissue integrity Goals: Patient/caregiver will verbalize understanding of skin care regimen Date Initiated: 06/11/2021 Target Resolution Date: 09/11/2021 Goal Status: Active Ulcer/skin breakdown will have a volume reduction of 30% by week 4 Date Initiated: 06/11/2021 Date Inactivated: 07/15/2021 Target Resolution Date: 07/17/2021 Goal Status: Met Ulcer/skin breakdown will have a volume reduction of 50% by week 8 Date Initiated: 07/15/2021 Date Inactivated: 08/12/2021 Target Resolution Date: 08/14/2021 Goal Status: Met Interventions: Assess patient/caregiver ability to obtain necessary supplies Assess patient/caregiver ability to perform ulcer/skin care regimen upon admission and as needed Assess ulceration(s)  every visit Notes: Electronic Signature(s) Signed: 08/26/2021 5:49:01 PM By: Lynch, Shatara RN, BSN Entered By: Lynch, Shatara on 08/26/2021 13:14:49 -------------------------------------------------------------------------------- Pain Assessment Details Patient Name: Date of Service: [name], Caleb RD C. 08/26/2021 12:45 PM Medical Record Number: [id number] Patient Account Number: [id number] Date of Birth/Sex: Treating RN: 01/02/1946 (75 y.o. M) Lynch, Shatara Primary Care Provider: Wong, Francis Other Clinician: Referring Provider: Treating Provider/Extender: Robson, Michael Wong, Francis Weeks in Treatment: 10 Active Problems Location of Pain Severity and Description of Pain Patient Has Paino No Site Locations Pain Management and Medication Current Pain Management: Electronic Signature(s) Signed: 08/26/2021 5:49:01 PM By: Lynch, Shatara RN, BSN Entered By: Lynch, Shatara on 08/26/2021 12:45:59 -------------------------------------------------------------------------------- Patient/Caregiver Education Details Patient Name: Date of Service: [name], Caleb RD C. 9/7/2022andnbsp12:45 PM Medical Record Number: [id number] Patient Account Number: [id number] Date of Birth/Gender: Treating RN: 09/06/1946 (75 y.o. M) Lynch, Shatara Primary Care Physician: Wong, Francis Other Clinician: Referring Physician: Treating Physician/Extender: Robson, Michael Wong, Francis Weeks in Treatment: 10 Education Assessment Education Provided To: Patient Education Topics Provided Wound/Skin Impairment: Methods: Explain/Verbal Responses: State content correctly Electronic Signature(s) Signed: 08/26/2021 5:49:01 PM By: Lynch, Shatara RN, BSN Entered By: Lynch, Shatara on 08/26/2021 13:15:02 -------------------------------------------------------------------------------- Wound Assessment Details Patient Name: Date of Service: [name], Caleb RD C. 08/26/2021 12:45 PM Medical Record Number:  [id number] Patient Account Number: [id number] Date   of Birth/Sex: Treating RN: 1946/06/22 (75 y.o. [name] Primary Care Sam Wunschel: Yaakov Guthrie Other Clinician: Referring Jeremian Whitby: Treating Natia Fahmy/Extender: [name] in Treatment: 10 Wound Status Wound Number: 4 Primary Neuropathic Ulcer-Non Diabetic Etiology: Wound Location: Right, Lateral Foot Wound Open Wounding Event: Gradually Appeared Status: Date Acquired: 03/30/2021 Comorbid Glaucoma, Chronic Obstructive Pulmonary Disease (COPD), Sleep Weeks Of Treatment: 10 History: Apnea, Hypertension, Neuropathy, Received Chemotherapy, Clustered Wound: No Received Radiation Photos Wound Measurements Length: (cm) 0.3 Width: (cm) 0.6 Depth: (cm) 0.3 Area: (cm) 0.141 Volume: (cm) 0.042 % Reduction in Area: 97.4% % Reduction in Volume: 92.4% Epithelialization: Medium (34-66%) Tunneling: No Undermining: Yes Starting Position (o'clock): 12 Ending Position (o'clock): 12 Maximum Distance: (cm) 0.2 Wound Description Classification: Full Thickness Without Exposed Support Structures Wound Margin: Distinct, outline attached Exudate Amount: Medium Exudate Type: Serosanguineous Exudate Color: red, brown Foul Odor After Cleansing: No Slough/Fibrino No Wound Bed Granulation Amount: Large (67-100%) Exposed Structure Granulation Quality: Red, Pink Fascia Exposed: No Necrotic Amount: None Present (0%) Fat Layer (Subcutaneous Tissue) Exposed: Yes Tendon Exposed: No Muscle Exposed: No Joint Exposed: No Bone Exposed: No Treatment Notes Wound #4 (Foot) Wound Laterality: Right, Lateral Cleanser Soap and Water Discharge Instruction: May shower and wash wound with dial antibacterial soap and water prior to dressing change. Wound Cleanser Discharge Instruction: Cleanse the wound with wound cleanser prior to applying a clean dressing using gauze sponges, not tissue or cotton balls. Peri-Wound  Care Topical Primary Dressing KerraCel Ag Gelling Fiber Dressing, 2x2 in (silver alginate) Discharge Instruction: Apply silver alginate to wound bed as instructed Secondary Dressing Woven Gauze Sponge, Non-Sterile 4x4 in Discharge Instruction: Apply over primary dressing as directed. Optifoam Non-Adhesive Dressing, 4x4 in Discharge Instruction: foam donut Secured With Conforming Stretch Gauze Bandage, Sterile 2x75 (in/in) Discharge Instruction: Secure with stretch gauze as directed. 84M Medipore H Soft Cloth Surgical T ape, 2x2 (in/yd) Discharge Instruction: Secure dressing with tape as directed. Compression Wrap Compression Stockings Add-Ons Electronic Signature(s) Signed: 08/26/2021 5:49:01 PM By: Levan Hurst RN, BSN Entered By: Levan Hurst on 08/26/2021 12:59:13 -------------------------------------------------------------------------------- Vitals Details Patient Name: Date of Service: Caleb Alken RD C. 08/26/2021 12:45 PM Medical Record Number: [id number] Patient Account Number: [id number] Date of Birth/Sex: Treating RN: 1946-06-30 (75 y.o. [name] Primary Care Wiliam Cauthorn: Yaakov Guthrie Other Clinician: Referring Sofiya Ezelle: Treating Vaunda Gutterman/Extender: [name] in Treatment: 10 Vital Signs Time Taken: 12:45 Temperature (F): 98.0 Height (in): 69 Pulse (bpm): 99 Weight (lbs): 210 Respiratory Rate (breaths/min): 16 Body Mass Index (BMI): 31 Blood Pressure (mmHg): 136/83 Reference Range: 80 - 120 mg / dl Electronic Signature(s) Signed: 08/26/2021 5:49:01 PM By: Levan Hurst RN, BSN Entered By: Levan Hurst on 08/26/2021 12:45:52

## 2021-08-27 ENCOUNTER — Other Ambulatory Visit (HOSPITAL_COMMUNITY)
Admit: 2021-08-27 | Discharge: 2021-08-27 | Disposition: A | Discharge status: Home / Self Care | Payer: Medicare Other | Source: Ambulatory Visit | Attending: Internal Medicine | Admitting: Internal Medicine

## 2021-08-27 DIAGNOSIS — L97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 with unspecified severity: Secondary | ICD-10-CM | POA: Insufficient documentation

## 2021-08-30 LAB — AEROBIC CULTURE W GRAM STAIN (SUPERFICIAL SPECIMEN): Gram Stain: NONE SEEN

## 2021-09-02 ENCOUNTER — Encounter (HOSPITAL_BASED_OUTPATIENT_CLINIC_OR_DEPARTMENT_OTHER): Payer: Medicare Other | Admitting: Internal Medicine

## 2021-09-02 ENCOUNTER — Other Ambulatory Visit: Payer: Self-pay

## 2021-09-02 DIAGNOSIS — L97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 with unspecified severity: Secondary | ICD-10-CM | POA: Diagnosis not present

## 2021-09-02 NOTE — Progress Notes (Signed)
Caleb, Everett ([id number]) Visit Report for 09/02/2021 HPI Details Patient Name: Date of Service: Caleb, Everett RD C. 09/02/2021 12:30 PM Medical Record Number: [id number] Patient Account Number: [id number] Date of Birth/Sex: Treating RN: 08-Dec-1946 (75 y.o. [name], [name] Primary Care Provider: Yaakov Everett Other Clinician: Referring Provider: Treating Provider/Extender: [name] in Treatment: 11 History of Present Illness HPI Description: Admission 6/23 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 He states this started a little over 2 months ago. This is now the third time he has developed a wound to this area. He followed with podiatry for this issue and has been using Betadine daily to it. He recently switched to silver alginate a week ago. He has been on doxycycline since May For this issue and just recently started a course of Keflex prescribed in the ED on 6/18 For possible cellulitis. Currently denies signs of infection. He uses a front offloading shoe to help with relieving the pressure associated with the wound formation. He reports minimal drainage. He does not have sensation to his feet and denies any pain. Currently denies signs of infection. 6/29; this is a patient with chemotherapy-induced neuropathy. We had in clinic last year he was readmitted to our clinic last week. He has an area on the right fifth metatarsal head. Dr. Heber Everett admitted to the to the clinic last week started him on Santyl they are using this daily. The wound started in March she went to podiatry. They are using Betadine to this I am not really sure about the benefit of Betadine in this type of situation. There is not appear to be any active infection. He is using a forefoot offloading boot 7/6; wound on the right fifth  metatarsal head. Using Santyl daily. He is using a forefoot offloading boot but claims to be staying off this religiously Again talk to me about a vacation they had planned for mid August. They have a cancellation insurance and want to go ahead and put this in. I asked him to see if there is a specific form they want filled out. It does seem unlikely that he will be healed by that time [August 12]. Furthermore there are apparently no elevators they be going up stairs. 7/13; right fifth met head. Still not a viable surface using Hydrofera Blue and Santyl. Offloading in a forefoot offloading 7/20; right fifth metatarsal head. Surface looks better. Dimensions down by 4 x 3 mm. We will moved to straight [facility] with a total contact cast today His wife asked me to look at some erythema on the left anterior lower leg. It is indeed warm but not tender [however he is neuropathic]. He also has some degree of venous insufficiency I wonder some of this could be stasis dermatitis 7/22; patient presents for obligatory cast change. He reports not issues with the cast placed earlier this week. He tolerated this well. He has no complaints or questions today. 7/29; the patient has no complaints. The wound actually looks quite good rim of epithelialization nice healthy granulation. But only minimal improvement in size 8/4; patient presents for follow-up. He has no issues or complaints today. He has tolerated the cast well over the past week. He denies signs of infection. 8/5; patient was seen yesterday. He states that the cast was uncomfortable and rubbing at the top. He  is here for evaluation of replacement. 07/28/2021 upon evaluation today patient presents for follow-up he was last seen on the fifth for his last measurement. Fortunately there is no signs of active infection at this time which is great news and overall very pleased with where we stand. With that being said from the starting point of where he is  managing this is definitely showing signs of improvement which is great news there is a little bit of hypergranulation I think silver nitrate could be helpful today. 8/17; wound is contracting nicely on the right fifth metatarsal head. Using [facility] and a total contact cast this is his second go round with this type of wound in this area. We previously had him in this clinic and heal them out lasting about 9 months. He apparently saw [name] Pronto and [name] for routine and prophylactic nail care. They mention there might be a surgical option for his subluxing metatarsal head in this area 8/24; wound is down slightly in surface area however he has a considerable amount of maceration around the wound extending into the dorsal foot at the base of his toes and even proximally. There is not enough drainage from the small open area currently to do this. I wonder whether he simply had water go down this in the shower although he is using a cast protector. The other thing is I wonder if he is sitting out in the sun and sweating excessively. 8/31; wound continues to go down gradually and looks healthy. They were able to discover that there was a leak in his cast protector creating the macerated skin on the bottom of his foot last week 9/7; arrives today with the wound measuring smaller however at bedside exam there was clearly extension laterally subcutaneously with a large amount of purulence relative to the wound size. I used a #5 curette to clean up the wound callus and subcutaneous debris. A culture of the purulent drainage coming predominantly laterally over the fifth metatarsal head was obtained. We have not cultured this wound before. He came into the clinic on doxycycline. He tells me that he cannot take pills orally he has to either crush them open capsules or take suspensions. I am not sure you can do this with doxycycline however 9/14; culture of the wound on the right fifth met head last  week grew abundant Enterobacter cloacae. The organism was not sensitive to Augmentin therefore I changed him to cefdinir 300 twice daily yesterday. I gave him a weeks worth. The wound has contracted quite a bit. We took him out of the total contact cast last week as well Electronic Signature(s) Signed: 09/02/2021 4:44:54 PM By: Linton Ham MD Entered By: Linton Ham on 09/02/2021 13:29:54 -------------------------------------------------------------------------------- Physical Exam Details Patient Name: Date of Service: [name] 09/02/2021 12:30 PM Medical Record Number: [id number] Patient Account Number: [id number] Date of Birth/Sex: Treating RN: Oct 08, 1946 (75 y.o. [name] Primary Care Provider: Yaakov Everett Other Clinician: Referring Provider: Treating Provider/Extender: [name] in Treatment: 11 Constitutional Patient is hypertensive.. Pulse regular and within target range for patient.Marland Kitchen Respirations regular, non-labored and within target range.. Temperature is normal and within the target range for the patient.Marland Kitchen Appears in no distress. Notes Wound exam; only 2 small open areas are present. There is no extension laterally like there was last week there is still warmth in the area of the concern me a bit. No debridement is required. No obvious joint effusion. His wife points  out that his ankles were swollen this week on the right side I did not see this Electronic Signature(s) Signed: 09/02/2021 4:44:54 PM By: Linton Ham MD Entered By: Linton Ham on 09/02/2021 13:30:49 -------------------------------------------------------------------------------- Physician Orders Details Patient Name: Date of Service: [name] 09/02/2021 12:30 PM Medical Record Number: [id number] Patient Account Number: [id number] Date of Birth/Sex: Treating RN: 10-24-1946 (75 y.o. 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 - with Dr. Dellia Nims Return A Other: - 09/02/21: Continue Antibiotic as Prescribed Bathing/ Shower/ Hygiene May shower with protection but do not get wound dressing(s) wet. Edema Control - Lymphedema / SCD / Other Elevate legs to the level of the heart or above for 30 minutes daily and/or when sitting, a frequency of: - throughout the day Avoid standing for long periods of time. Off-Loading Open toe surgical shoe to: - right foot Wound Treatment Wound #4 - Foot Wound Laterality: Right, Lateral Cleanser: Soap and Water 1 x Per Day/7 Days Discharge Instructions: May shower and wash wound with dial antibacterial soap and water prior to dressing change. Cleanser: Wound Cleanser 1 x Per Day/7 Days Discharge Instructions: Cleanse the wound with wound cleanser prior to applying a clean dressing using gauze sponges, not tissue or cotton balls. Prim Dressing: KerraCel Ag Gelling Fiber Dressing, 2x2 in (silver alginate) 1 x Per Day/7 Days ary Discharge Instructions: Apply silver alginate to wound bed as instructed Secondary Dressing: Woven Gauze Sponge, Non-Sterile 4x4 in 1 x Per Day/7 Days Discharge Instructions: Apply over primary dressing as directed. Secondary Dressing: Optifoam Non-Adhesive Dressing, 4x4 in 1 x Per Day/7 Days Discharge Instructions: foam donut Secured With: Conforming Stretch Gauze Bandage, Sterile 2x75 (in/in) 1 x Per Day/7 Days Discharge  Instructions: Secure with stretch gauze as directed. Secured With: 79M Medipore H Soft Cloth Surgical Tape, 2x2 (in/yd) 1 x Per Day/7 Days Discharge Instructions: Secure dressing with tape as directed. Electronic Signature(s) Signed: 09/02/2021 4:44:54 PM By: Linton Ham MD Signed: 09/02/2021 5:11:07 PM By: Lorrin Jackson Entered By: Lorrin Jackson on 09/02/2021 13:19:35 -------------------------------------------------------------------------------- Problem List Details Patient Name: Date of Service: [name] 09/02/2021 12:30 PM Medical Record Number: [id number] Patient Account Number: [id number] Date of Birth/Sex: Treating RN: 03-Sep-1946 (75 y.o. 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 06/11/2021 No Yes J44.9 Chronic obstructive pulmonary disease, unspecified 06/11/2021 No Yes G47.33 Obstructive sleep apnea (adult) (pediatric) 06/11/2021 No Yes Z86.718 Personal history of other venous thrombosis and embolism 06/11/2021 No Yes L03.116 Cellulitis of left lower limb 07/08/2021 No Yes Inactive Problems Resolved Problems Electronic Signature(s) Signed: 09/02/2021 4:44:54 PM By: Linton Ham MD Entered By: Linton Ham on 09/02/2021 13:28:26 -------------------------------------------------------------------------------- Progress Note Details Patient Name: Date of Service: [name] 09/02/2021 12:30 PM Medical Record Number: [id number] Patient Account Number: [id number] Date of Birth/Sex: Treating RN: 11-Jun-1946 (75 y.o. [name] Primary Care Provider:  Yaakov Everett Other Clinician: Referring Provider: Treating Provider/Extender: [name] in Treatment: 11 Subjective History of Present Illness (HPI) Admission 6/23 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 He states this started a little over 2 months ago. This is now the third time he has developed a wound to this area. He followed with podiatry for this issue and has been using Betadine daily to it. He recently switched to silver alginate a week ago. He has been on doxycycline since May For this issue and just recently started a course of Keflex prescribed in the ED on 6/18 For possible cellulitis. Currently denies signs of infection. He uses a front offloading shoe to help with relieving the pressure associated with the wound formation. He reports minimal drainage. He does not have sensation to his feet and denies any pain. Currently denies signs of infection. 6/29; this is a patient with chemotherapy-induced neuropathy. We had in clinic last year he was readmitted to our clinic last week. He has an area on the right fifth metatarsal head. Dr. Heber [place] admitted to the to the clinic last week started him on Santyl they are using this daily. The wound started in March she went to podiatry. They are using Betadine to this I am not really sure about the benefit of Betadine in this type of situation. There is not appear to be any active infection. He is using a forefoot offloading boot 7/6; wound on the right fifth metatarsal head. Using Santyl daily. He is using a forefoot offloading boot but claims to be staying off this religiously Again talk to me about a vacation they had planned for mid August. They have a cancellation insurance and want to go ahead and put this in. I asked him to see if there is a specific form they want filled out. It does seem unlikely that he will be healed by that time [August 12]. Furthermore  there are apparently no elevators they be going up stairs. 7/13; right fifth met head. Still not a viable surface using Hydrofera Blue and Santyl. Offloading in a forefoot offloading 7/20; right fifth metatarsal head. Surface looks better. Dimensions down by 4 x 3 mm. We will moved to straight [facility] with a total contact cast today His wife asked me to look at some erythema on the left anterior lower leg. It is indeed warm but not tender [however he is neuropathic]. He also has some degree of venous insufficiency I wonder some of this could be stasis dermatitis 7/22; patient presents for obligatory cast change. He reports not issues with the cast placed earlier this week. He tolerated this well. He has no complaints or questions today. 7/29; the patient has no complaints. The wound actually looks quite good rim of epithelialization nice healthy granulation. But only minimal improvement in size 8/4; patient presents for follow-up. He has no issues or complaints today. He has tolerated the cast well over the past week. He denies signs of infection. 8/5; patient was seen yesterday. He states that the cast was uncomfortable and rubbing at the top. He is here for evaluation of replacement. 07/28/2021 upon evaluation today patient presents for follow-up he was last seen on the fifth for his last measurement. Fortunately there is no signs of active infection at this time which is great news and overall very  pleased with where we stand. With that being said from the starting point of where he is managing this is definitely showing signs of improvement which is great news there is a little bit of hypergranulation I think silver nitrate could be helpful today. 8/17; wound is contracting nicely on the right fifth metatarsal head. Using [facility] and a total contact cast this is his second go round with this type of wound in this area. We previously had him in this clinic and heal them out lasting  about 9 months. He apparently saw [name] Pronto and [name] for routine and prophylactic nail care. They mention there might be a surgical option for his subluxing metatarsal head in this area 8/24; wound is down slightly in surface area however he has a considerable amount of maceration around the wound extending into the dorsal foot at the base of his toes and even proximally. There is not enough drainage from the small open area currently to do this. I wonder whether he simply had water go down this in the shower although he is using a cast protector. The other thing is I wonder if he is sitting out in the sun and sweating excessively. 8/31; wound continues to go down gradually and looks healthy. They were able to discover that there was a leak in his cast protector creating the macerated skin on the bottom of his foot last week 9/7; arrives today with the wound measuring smaller however at bedside exam there was clearly extension laterally subcutaneously with a large amount of purulence relative to the wound size. I used a #5 curette to clean up the wound callus and subcutaneous debris. A culture of the purulent drainage coming predominantly laterally over the fifth metatarsal head was obtained. We have not cultured this wound before. He came into the clinic on doxycycline. He tells me that he cannot take pills orally he has to either crush them open capsules or take suspensions. I am not sure you can do this with doxycycline however 9/14; culture of the wound on the right fifth met head last week grew abundant Enterobacter cloacae. The organism was not sensitive to Augmentin therefore I changed him to cefdinir 300 twice daily yesterday. I gave him a weeks worth. The wound has contracted quite a bit. We took him out of the total contact cast last week as well Objective Constitutional Patient is hypertensive.. Pulse regular and within target range for patient.Marland Kitchen Respirations regular, non-labored and  within target range.. Temperature is normal and within the target range for the patient.Marland Kitchen Appears in no distress. Vitals Time Taken: 1:00 PM, Height: 69 in, Weight: 210 lbs, BMI: 31, Temperature: 98.1 F, Pulse: 75 bpm, Respiratory Rate: 16 breaths/min, Blood Pressure: 143/99 mmHg. General Notes: Wound exam; only 2 small open areas are present. There is no extension laterally like there was last week there is still warmth in the area of the concern me a bit. No debridement is required. No obvious joint effusion. His wife points out that his ankles were swollen this week on the right side I did not see this Integumentary (Hair, Skin) Wound #4 status is Open. Original cause of wound was Gradually Appeared. The date acquired was: 03/30/2021. The wound has been in treatment 11 weeks. The wound is located on the Right,Lateral Foot. The wound measures 0.2cm length x 0.2cm width x 0.2cm depth; 0.031cm^2 area and 0.006cm^3 volume. There is Fat Layer (Subcutaneous Tissue) exposed. There is no tunneling or undermining noted. There is  a medium amount of serosanguineous drainage noted. The wound margin is distinct with the outline attached to the wound base. There is large (67-100%) red, pink granulation within the wound bed. There is no necrotic tissue within the wound bed. 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 - with Dr. Dellia Nims Other: - 09/02/21: Continue Antibiotic as Prescribed Bathing/ Shower/ Hygiene: May shower with protection but do not get wound dressing(s) wet. Edema Control - Lymphedema / SCD / Other: Elevate legs to the level of the  heart or above for 30 minutes daily and/or when sitting, a frequency of: - throughout the day Avoid standing for long periods of time. Off-Loading: Open toe surgical shoe to: - right foot WOUND #4: - Foot Wound Laterality: Right, Lateral Cleanser: Soap and Water 1 x Per Day/7 Days Discharge Instructions: May shower and wash wound with dial antibacterial soap and water prior to dressing change. Cleanser: Wound Cleanser 1 x Per Day/7 Days Discharge Instructions: Cleanse the wound with wound cleanser prior to applying a clean dressing using gauze sponges, not tissue or cotton balls. Prim Dressing: KerraCel Ag Gelling Fiber Dressing, 2x2 in (silver alginate) 1 x Per Day/7 Days ary Discharge Instructions: Apply silver alginate to wound bed as instructed Secondary Dressing: Woven Gauze Sponge, Non-Sterile 4x4 in 1 x Per Day/7 Days Discharge Instructions: Apply over primary dressing as directed. Secondary Dressing: Optifoam Non-Adhesive Dressing, 4x4 in 1 x Per Day/7 Days Discharge Instructions: foam donut Secured With: Conforming Stretch Gauze Bandage, Sterile 2x75 (in/in) 1 x Per Day/7 Days Discharge Instructions: Secure with stretch gauze as directed. Secured With: 65M Medipore H Soft Cloth Surgical T ape, 2x2 (in/yd) 1 x Per Day/7 Days Discharge Instructions: Secure dressing with tape as directed. 1. The wound area seems a lot better right foot plantar and lateral first metatarsal heads. 2. Still only 2 small open areas are present. 3. The area is still warm which concerns me a bit although I certainly saw no streaking erythema up the lateral part of his foot nothing in his lower leg. He is remained systemically well 4. I may actually extend his antibiotics next week depending on the condition of things. 5. I have asked him to have his PT and INR checked in 2 days time Electronic Signature(s) Signed: 09/02/2021 4:44:54 PM By: Linton Ham MD Entered By: Linton Ham on 09/02/2021  13:33:10 -------------------------------------------------------------------------------- SuperBill Details Patient Name: Date of Service: [name] 09/02/2021 Medical Record Number: [id number] Patient Account Number: [id number] Date of Birth/Sex: Treating RN: 07-24-1946 (75 y.o. 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 J44.9 Chronic obstructive pulmonary disease, unspecified G47.33 Obstructive sleep apnea (adult) (pediatric) Z86.718 Personal history of other venous thrombosis and embolism L03.115 Cellulitis of right lower limb Facility Procedures CPT4 Code: [id number] Description: [id number] - WOUND CARE VISIT-LEV 2 EST PT Modifier: Quantity: 1 Physician Procedures : CPT4 Code Description Modifier [id number] - WC PHYS LEVEL 3 - EST PT ICD-10 Diagnosis Description 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

## 2021-09-03 NOTE — Progress Notes (Signed)
Maves, Greg C. ([id number]) Visit Report for 09/02/2021 Arrival Information Details Patient Name: Date of Service: Stys, RICHA RD C. 09/02/2021 12:30 PM Medical Record Number: [id number] Patient Account Number: [id number] Date of Birth/Sex: Treating RN: 01/02/1946 (75 y.o. M) Breedlove, [name] Primary Care Provider: Wong, Francis Other Clinician: Referring Provider: Treating Provider/Extender: Robson, Michael Wong, Francis Weeks in Treatment: 11 Visit Information History Since Last Visit Added or deleted any medications: No Patient Arrived: Ambulatory Any new allergies or adverse reactions: No Arrival Time: 12:59 Had a fall or experienced change in No Accompanied By: wife activities of daily living that may affect Transfer Assistance: None risk of falls: Patient Identification Verified: Yes Signs or symptoms of abuse/neglect since last visito No Secondary Verification Process Completed: Yes Hospitalized since last visit: No Patient Requires Transmission-Based Precautions: No Implantable device outside of the clinic excluding No Patient Has Alerts: Yes cellular tissue based products placed in the center Patient Alerts: Patient on Blood Thinner since last visit: R ABI: 1.01 TBI: 0.77 Has Dressing in Place as Prescribed: Yes L ABI: 1.22 TBI: 0.87 Pain Present Now: No 05/01/21 Electronic Signature(s) Signed: 09/02/2021 2:59:51 PM By: Dawkins, Destiny Entered By: Dawkins, Destiny on 09/02/2021 13:00:16 -------------------------------------------------------------------------------- Clinic Level of Care Assessment Details Patient Name: Date of Service: Liggett, RICHA RD C. 09/02/2021 12:30 PM Medical Record Number: [id number] Patient Account Number: [id number] Date of Birth/Sex: Treating RN: 07/14/1946 (75 y.o. M) Barnhart, Jodi Primary Care Provider: Wong, Francis Other Clinician: Referring Provider: Treating Provider/Extender: Robson, Michael Wong, Francis Weeks in Treatment:  11 Clinic Level of Care Assessment Items TOOL 4 Quantity Score X- 1 0 Use when only an EandM is performed on FOLLOW-UP visit ASSESSMENTS - Nursing Assessment / Reassessment X- 1 10 Reassessment of Co-morbidities (includes updates in patient status) X- 1 5 Reassessment of Adherence to Treatment Plan ASSESSMENTS - Wound and Skin A ssessment / Reassessment X - Simple Wound Assessment / Reassessment - one wound 1 5 [] - 0 Complex Wound Assessment / Reassessment - multiple wounds [] - 0 Dermatologic / Skin Assessment (not related to wound area) ASSESSMENTS - Focused Assessment [] - 0 Circumferential Edema Measurements - multi extremities [] - 0 Nutritional Assessment / Counseling / Intervention [] - 0 Lower Extremity Assessment (monofilament, tuning fork, pulses) [] - 0 Peripheral Arterial Disease Assessment (using hand held doppler) ASSESSMENTS - Ostomy and/or Continence Assessment and Care [] - 0 Incontinence Assessment and Management [] - 0 Ostomy Care Assessment and Management (repouching, etc.) PROCESS - Coordination of Care [] - 0 Simple Patient / Family Education for ongoing care X- 1 20 Complex (extensive) Patient / Family Education for ongoing care [] - 0 Staff obtains Consents, Records, T Results / Process Orders est [] - 0 Staff telephones HHA, Nursing Homes / Clarify orders / etc [] - 0 Routine Transfer to another Facility (non-emergent condition) [] - 0 Routine Hospital Admission (non-emergent condition) [] - 0 New Admissions / Insurance Authorizations / Ordering NPWT Apligraf, etc. , [] - 0 Emergency Hospital Admission (emergent condition) [] - 0 Simple Discharge Coordination [] - 0 Complex (extensive) Discharge Coordination PROCESS - Special Needs [] - 0 Pediatric / Minor Patient Management [] - 0 Isolation Patient Management [] - 0 Hearing / Language / Visual special needs [] - 0 Assessment of Community assistance (transportation, D/C planning,  etc.) [] - 0 Additional assistance / Altered mentation [] - 0 Support Surface(s) Assessment (bed, cushion, seat, etc.) INTERVENTIONS - Wound Cleansing / Measurement X - Simple Wound Cleansing -   one wound 1 5 [] - 0 Complex Wound Cleansing - multiple wounds X- 1 5 Wound Imaging (photographs - any number of wounds) [] - 0 Wound Tracing (instead of photographs) X- 1 5 Simple Wound Measurement - one wound [] - 0 Complex Wound Measurement - multiple wounds INTERVENTIONS - Wound Dressings [] - 0 Small Wound Dressing one or multiple wounds X- 1 15 Medium Wound Dressing one or multiple wounds [] - 0 Large Wound Dressing one or multiple wounds [] - 0 Application of Medications - topical [] - 0 Application of Medications - injection INTERVENTIONS - Miscellaneous [] - 0 External ear exam [] - 0 Specimen Collection (cultures, biopsies, blood, body fluids, etc.) [] - 0 Specimen(s) / Culture(s) sent or taken to Lab for analysis [] - 0 Patient Transfer (multiple staff / Civil Service fast streamer / Similar devices) [] - 0 Simple Staple / Suture removal (25 or less) [] - 0 Complex Staple / Suture removal (26 or more) [] - 0 Hypo / Hyperglycemic Management (close monitor of Blood Glucose) [] - 0 Ankle / Brachial Index (ABI) - do not check if billed separately X- 1 5 Vital Signs Has the patient been seen at the hospital within the last three years: Yes Total Score: 75 Level Of Care: New/Established - Level 2 Electronic Signature(s) Signed: 09/02/2021 5:11:07 PM By: Lorrin Jackson Entered By: Lorrin Jackson on 09/02/2021 13:21:16 -------------------------------------------------------------------------------- Encounter Discharge Information Details Patient Name: Date of Service: [name] 09/02/2021 12:30 PM Medical Record Number: [id number] Patient Account Number: [id number] Date of Birth/Sex: Treating RN: 24-Oct-1946 (75 y.o. Marcheta Grammes Primary Care Darran Gabay: Yaakov Guthrie Other  Clinician: Referring Braian Tijerina: Treating Melicia Esqueda/Extender: [name] in Treatment: 11 Encounter Discharge Information Items Discharge Condition: Stable Discharge Destination: Home Transportation: Private Auto Accompanied By: Wife Schedule Follow-up Appointment: Yes Clinical Summary of Care: Provided on 09/02/2021 Form Type Recipient Paper Patient Patient Electronic Signature(s) Signed: 09/02/2021 5:11:07 PM By: Lorrin Jackson Entered By: Lorrin Jackson on 09/02/2021 13:55:40 -------------------------------------------------------------------------------- Lower Extremity Assessment Details Patient Name: Date of Service: COLSON, BARCO RD C. 09/02/2021 12:30 PM Medical Record Number: [id number] Patient Account Number: [id number] Date of Birth/Sex: Treating RN: 1946/07/21 (75 y.o. Marcheta Grammes Primary Care Elohim Brune: Yaakov Guthrie Other Clinician: Referring Sueanne Maniaci: Treating Dayanara Sherrill/Extender: [name] in Treatment: 11 Edema Assessment Assessed: Shirlyn Goltz: No] Patrice Paradise: Yes] Edema: [Left: N] [Right: o] Calf Left: Right: Point of Measurement: 34 cm From Medial Instep 36.6 cm Ankle Left: Right: Point of Measurement: 13 cm From Medial Instep 24.5 cm Vascular Assessment Pulses: Dorsalis Pedis Palpable: [Right:Yes] Electronic Signature(s) Signed: 09/02/2021 5:11:07 PM By: Lorrin Jackson Entered By: Lorrin Jackson on 09/02/2021 13:10:18 -------------------------------------------------------------------------------- Multi Wound Chart Details Patient Name: Date of Service: [name] 09/02/2021 12:30 PM Medical Record Number: [id number] Patient Account Number: [id number] Date of Birth/Sex: Treating RN: 05-Nov-1946 (75 y.o. 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 Primary Etiology: Glaucoma, Chronic Obstructive N/A N/A Comorbid History: Pulmonary Disease (COPD), Sleep Apnea, Hypertension, Neuropathy, Received Chemotherapy, Received Radiation 03/30/2021 N/A N/A Date Acquired: 11 N/A N/A Weeks of Treatment: Open N/A N/A Wound Status: 0.2x0.2x0.2 N/A N/A Measurements L x W  x D (cm) 0.031 N/A N/A A (cm) : rea 0.006 N/A N/A Volume (cm) : 99.40% N/A N/A % Reduction in Area: 98.90% N/A N/A % Reduction in Volume: Full Thickness Without Exposed N/A N/A Classification: Support Structures Medium N/A N/A Exudate Amount: Serosanguineous N/A N/A Exudate Type: red, brown N/A N/A Exudate Color: Distinct, outline attached N/A N/A Wound Margin: Large (67-100%) N/A N/A Granulation Amount: Red, Pink N/A N/A Granulation Quality: None Present (0%) N/A N/A Necrotic Amount: Fat Layer (Subcutaneous Tissue): Yes N/A N/A Exposed Structures: Fascia: No Tendon: No Muscle: No Joint: No Bone: No Medium (34-66%) N/A N/A Epithelialization: Dry, calloused periwound N/A N/A Assessment Notes: Treatment Notes Wound #4 (Foot) Wound Laterality: Right, Lateral Cleanser Soap and Water Discharge Instruction: May shower and wash wound with dial antibacterial soap and water prior to dressing change. Wound Cleanser Discharge Instruction: Cleanse the wound with wound cleanser prior to applying a clean dressing using gauze sponges, not tissue or cotton balls. Peri-Wound Care Topical Primary Dressing KerraCel Ag Gelling Fiber Dressing, 2x2 in (silver alginate) Discharge Instruction: Apply silver alginate to wound bed as instructed Secondary Dressing Woven Gauze  Sponge, Non-Sterile 4x4 in Discharge Instruction: Apply over primary dressing as directed. Optifoam Non-Adhesive Dressing, 4x4 in Discharge Instruction: foam donut Secured With Conforming Stretch Gauze Bandage, Sterile 2x75 (in/in) Discharge Instruction: Secure with stretch gauze as directed. 3M Medipore H Soft Cloth Surgical T ape, 2x2 (in/yd) Discharge Instruction: Secure dressing with tape as directed. Compression Wrap Compression Stockings Add-Ons Electronic Signature(s) Signed: 09/02/2021 4:44:54 PM By: Robson, Michael MD Signed: 09/03/2021 4:34:42 PM By: Breedlove, [name] RN Entered By: Robson, Michael on 09/02/2021 13:28:35 -------------------------------------------------------------------------------- Multi-Disciplinary Care Plan Details Patient Name: Date of Service: Agcaoili, RICHA RD C. 09/02/2021 12:30 PM Medical Record Number: [id number] Patient Account Number: [id number] Date of Birth/Sex: Treating RN: 11/24/1946 (75 y.o. M) Barnhart, Jodi Primary Care Provider: Wong, Francis Other Clinician: Referring Provider: Treating Provider/Extender: Robson, Michael Wong, Francis Weeks in Treatment: 11 Active Inactive Wound/Skin Impairment Nursing Diagnoses: Impaired tissue integrity Goals: Patient/caregiver will verbalize understanding of skin care regimen Date Initiated: 06/11/2021 Target Resolution Date: 09/11/2021 Goal Status: Active Ulcer/skin breakdown will have a volume reduction of 30% by week 4 Date Initiated: 06/11/2021 Date Inactivated: 07/15/2021 Target Resolution Date: 07/17/2021 Goal Status: Met Ulcer/skin breakdown will have a volume reduction of 50% by week 8 Date Initiated: 07/15/2021 Date Inactivated: 08/12/2021 Target Resolution Date: 08/14/2021 Goal Status: Met Interventions: Assess patient/caregiver ability to obtain necessary supplies Assess patient/caregiver ability to perform ulcer/skin care regimen upon admission and as needed Assess ulceration(s)  every visit Notes: Electronic Signature(s) Signed: 09/02/2021 5:11:07 PM By: Barnhart, Jodi Entered By: Barnhart, Jodi on 09/02/2021 13:10:49 -------------------------------------------------------------------------------- Pain Assessment Details Patient Name: Date of Service: Pulsifer, RICHA RD C. 09/02/2021 12:30 PM Medical Record Number: [id number] Patient Account Number: [id number] Date of Birth/Sex: Treating RN: 08/04/1946 (75 y.o. M) Breedlove, [name] Primary Care Provider: Wong, Francis Other Clinician: Referring Provider: Treating Provider/Extender: Robson, Michael Wong, Francis Weeks in Treatment: 11 Active Problems Location of Pain Severity and Description of Pain Patient Has Paino No Site Locations Pain Management and Medication Current Pain Management: Electronic Signature(s) Signed: 09/02/2021 2:59:51 PM By: Dawkins, Destiny Signed: 09/03/2021 4:34:42 PM By: Breedlove, [name] RN Entered By: Dawkins, Destiny on 09/02/2021 13:00:53 -------------------------------------------------------------------------------- Patient/Caregiver Education Details Patient Name: Date of Service: Albertsen, RICHA RD C. 9/14/2022andnbsp12:30 PM Medical Record Number: [id number] Patient Account Number: [id number] Date of Birth/Gender: Treating RN: 06/20/1946 (75 y.o. M) Barnhart, Jodi Primary Care Physician: Wong, Francis Other Clinician: Referring   Physician: Treating Physician/Extender: [name] in Treatment: 11 Education Assessment Education Provided To: Patient Education Topics Provided Offloading: Methods: Explain/Verbal, Printed Responses: State content correctly Wound/Skin Impairment: Methods: Explain/Verbal, Printed Responses: State content correctly Electronic Signature(s) Signed: 09/02/2021 5:11:07 PM By: Lorrin Jackson Entered By: Lorrin Jackson on 09/02/2021  13:11:20 -------------------------------------------------------------------------------- Wound Assessment Details Patient Name: Date of Service: [name] 09/02/2021 12:30 PM Medical Record Number: [id number] Patient Account Number: [id number] Date of Birth/Sex: Treating RN: [birth year] (75 y.o. [name], [name] Primary Care Quandre Polinski: Yaakov Guthrie Other Clinician: Referring Trenia Tennyson: Treating Heidi Lemay/Extender: [name] in Treatment: 11 Wound Status Wound Number: 4 Primary Neuropathic Ulcer-Non Diabetic Etiology: Wound Location: Right, Lateral Foot Wound Open Wounding Event: Gradually Appeared Status: Date Acquired: 03/30/2021 Comorbid Glaucoma, Chronic Obstructive Pulmonary Disease (COPD), Sleep Weeks Of Treatment: 11 History: Apnea, Hypertension, Neuropathy, Received Chemotherapy, Clustered Wound: No Received Radiation Photos Wound Measurements Length: (cm) 0.2 Width: (cm) 0.2 Depth: (cm) 0.2 Area: (cm) 0.031 Volume: (cm) 0.006 % Reduction in Area: 99.4% % Reduction in Volume: 98.9% Epithelialization: Medium (34-66%) Tunneling: No Undermining: No Wound Description Classification: Full Thickness Without Exposed Support Structures Wound Margin: Distinct, outline attached Exudate Amount: Medium Exudate Type: Serosanguineous Exudate Color: red, brown Foul Odor After Cleansing: No Slough/Fibrino No Wound Bed Granulation Amount: Large (67-100%) Exposed Structure Granulation Quality: Red, Pink Fascia Exposed: No Necrotic Amount: None Present (0%) Fat Layer (Subcutaneous Tissue) Exposed: Yes Tendon Exposed: No Muscle Exposed: No Joint Exposed: No Bone Exposed: No Assessment Notes Dry, calloused periwound Treatment Notes Wound #4 (Foot) Wound Laterality: Right, Lateral Cleanser Soap and Water Discharge Instruction: May shower and wash wound with dial antibacterial soap and water prior to dressing change. Wound  Cleanser Discharge Instruction: Cleanse the wound with wound cleanser prior to applying a clean dressing using gauze sponges, not tissue or cotton balls. Peri-Wound Care Topical Primary Dressing KerraCel Ag Gelling Fiber Dressing, 2x2 in (silver alginate) Discharge Instruction: Apply silver alginate to wound bed as instructed Secondary Dressing Woven Gauze Sponge, Non-Sterile 4x4 in Discharge Instruction: Apply over primary dressing as directed. Optifoam Non-Adhesive Dressing, 4x4 in Discharge Instruction: foam donut Secured With Conforming Stretch Gauze Bandage, Sterile 2x75 (in/in) Discharge Instruction: Secure with stretch gauze as directed. 27M Medipore H Soft Cloth Surgical T ape, 2x2 (in/yd) Discharge Instruction: Secure dressing with tape as directed. Compression Wrap Compression Stockings Add-Ons Electronic Signature(s) Signed: 09/02/2021 5:11:07 PM By: Lorrin Jackson Signed: 09/03/2021 4:34:42 PM By: Rhae Hammock RN Entered By: Lorrin Jackson on 09/02/2021 13:09:53 -------------------------------------------------------------------------------- Vitals Details Patient Name: Date of Service: [name] 09/02/2021 12:30 PM Medical Record Number: [id number] Patient Account Number: [id number] Date of Birth/Sex: Treating RN: 1946/03/14 (75 y.o. [name], [name] Primary Care Bessie Boyte: Other Clinician: Yaakov Guthrie Referring Inda Mcglothen: Treating Tyreon Frigon/Extender: [name] in Treatment: 11 Vital Signs Time Taken: 13:00 Temperature (F): 98.1 Height (in): 69 Pulse (bpm): 75 Weight (lbs): 210 Respiratory Rate (breaths/min): 16 Body Mass Index (BMI): 31 Blood Pressure (mmHg): 143/99 Reference Range: 80 - 120 mg / dl Electronic Signature(s) Signed: 09/02/2021 2:59:51 PM By: Sandre Kitty Entered By: Sandre Kitty on 09/02/2021 13:00:45

## 2021-09-09 ENCOUNTER — Other Ambulatory Visit: Payer: Self-pay

## 2021-09-09 ENCOUNTER — Encounter (HOSPITAL_BASED_OUTPATIENT_CLINIC_OR_DEPARTMENT_OTHER): Payer: Medicare Other | Admitting: Internal Medicine

## 2021-09-09 DIAGNOSIS — L97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 with unspecified severity: Secondary | ICD-10-CM | POA: Diagnosis not present

## 2021-09-09 NOTE — Progress Notes (Signed)
[name] ([id number]) Visit Report for 09/09/2021 Arrival Information Details Patient Name: Date of Service: [name], Caleb RD C. 09/09/2021 1:00 PM Medical Record Number: [id number] Patient Account Number: [id number] Date of Birth/Sex: Treating RN: 07/16/1946 (75 y.o. [name] Primary Care Saidy Ormand: Yaakov Guthrie Other Clinician: Referring Lovely Kerins: Treating Shequila Neglia/Extender: [name] in Treatment: 12 Visit Information History Since Last Visit Added or deleted any medications: No Patient Arrived: Ambulatory Any new allergies or adverse reactions: No Arrival Time: 13:56 Had a fall or experienced change in No Accompanied By: spouse activities of daily living that may affect Transfer Assistance: None risk of falls: Patient Identification Verified: Yes Signs or symptoms of abuse/neglect since last visito No Secondary Verification Process Completed: Yes Hospitalized since last visit: No Patient Requires Transmission-Based Precautions: No Implantable device outside of the clinic excluding No Patient Has Alerts: Yes cellular tissue based products placed in the center Patient Alerts: Patient on Blood Thinner since last visit: R ABI: 1.01 TBI: 0.77 Has Dressing in Place as Prescribed: Yes L ABI: 1.22 TBI: 0.87 Pain Present Now: No 05/01/21 Electronic Signature(s) Signed: 09/09/2021 5:56:57 PM By: Levan Hurst RN, BSN Entered By: Levan Hurst on 09/09/2021 13:56:44 -------------------------------------------------------------------------------- Clinic Level of Care Assessment Details Patient Name: Date of Service: [name] RD C. 09/09/2021 1:00 PM Medical Record Number: [id number] Patient Account Number: [id number] Date of Birth/Sex: Treating RN: 01/31/1946 (75 y.o. [name] Primary Care Larry Alcock: Yaakov Guthrie Other Clinician: Referring Briony Parveen: Treating Donalee Gaumond/Extender: [name] in Treatment:  12 Clinic Level of Care Assessment Items TOOL 4 Quantity Score X- 1 0 Use when only an EandM is performed on FOLLOW-UP visit ASSESSMENTS - Nursing Assessment / Reassessment X- 1 10 Reassessment of Co-morbidities (includes updates in patient status) X- 1 5 Reassessment of Adherence to Treatment Plan ASSESSMENTS - Wound and Skin A ssessment / Reassessment X - Simple Wound Assessment / Reassessment - one wound 1 5 []  - 0 Complex Wound Assessment / Reassessment - multiple wounds []  - 0 Dermatologic / Skin Assessment (not related to wound area) ASSESSMENTS - Focused Assessment []  - 0 Circumferential Edema Measurements - multi extremities []  - 0 Nutritional Assessment / Counseling / Intervention X- 1 5 Lower Extremity Assessment (monofilament, tuning fork, pulses) []  - 0 Peripheral Arterial Disease Assessment (using hand held doppler) ASSESSMENTS - Ostomy and/or Continence Assessment and Care []  - 0 Incontinence Assessment and Management []  - 0 Ostomy Care Assessment and Management (repouching, etc.) PROCESS - Coordination of Care X - Simple Patient / Family Education for ongoing care 1 15 []  - 0 Complex (extensive) Patient / Family Education for ongoing care X- 1 10 Staff obtains Programmer, systems, Records, T Results / Process Orders est []  - 0 Staff telephones HHA, Nursing Homes / Clarify orders / etc []  - 0 Routine Transfer to another Facility (non-emergent condition) []  - 0 Routine Hospital Admission (non-emergent condition) []  - 0 New Admissions / Biomedical engineer / Ordering NPWT Apligraf, etc. , []  - 0 Emergency Hospital Admission (emergent condition) X- 1 10 Simple Discharge Coordination []  - 0 Complex (extensive) Discharge Coordination PROCESS - Special Needs []  - 0 Pediatric / Minor Patient Management []  - 0 Isolation Patient Management []  - 0 Hearing / Language / Visual special needs []  - 0 Assessment of Community assistance (transportation, D/C  planning, etc.) []  - 0 Additional assistance / Altered mentation []  - 0 Support Surface(s) Assessment (bed, cushion, seat, etc.) INTERVENTIONS - Wound Cleansing / Measurement X -  Simple Wound Cleansing - one wound 1 5 []  - 0 Complex Wound Cleansing - multiple wounds X- 1 5 Wound Imaging (photographs - any number of wounds) []  - 0 Wound Tracing (instead of photographs) X- 1 5 Simple Wound Measurement - one wound []  - 0 Complex Wound Measurement - multiple wounds INTERVENTIONS - Wound Dressings X - Small Wound Dressing one or multiple wounds 1 10 []  - 0 Medium Wound Dressing one or multiple wounds []  - 0 Large Wound Dressing one or multiple wounds []  - 0 Application of Medications - topical []  - 0 Application of Medications - injection INTERVENTIONS - Miscellaneous []  - 0 External ear exam []  - 0 Specimen Collection (cultures, biopsies, blood, body fluids, etc.) []  - 0 Specimen(s) / Culture(s) sent or taken to Lab for analysis []  - 0 Patient Transfer (multiple staff / Civil Service fast streamer / Similar devices) []  - 0 Simple Staple / Suture removal (25 or less) []  - 0 Complex Staple / Suture removal (26 or more) []  - 0 Hypo / Hyperglycemic Management (close monitor of Blood Glucose) []  - 0 Ankle / Brachial Index (ABI) - do not check if billed separately X- 1 5 Vital Signs Has the patient been seen at the hospital within the last three years: Yes Total Score: 90 Level Of Care: New/Established - Level 3 Electronic Signature(s) Signed: 09/09/2021 5:56:57 PM By: Levan Hurst RN, BSN Entered By: Levan Hurst on 09/09/2021 17:51:32 -------------------------------------------------------------------------------- Encounter Discharge Information Details Patient Name: Date of Service: [name] RD C. 09/09/2021 1:00 PM Medical Record Number: [id number] Patient Account Number: [id number] Date of Birth/Sex: Treating RN: 1946-10-29 (75 y.o. [name] Primary Care [name]:  Yaakov Guthrie Other Clinician: Referring Red Mandt: Treating Giuliana Handyside/Extender: [name] in Treatment: 12 Encounter Discharge Information Items Discharge Condition: Stable Ambulatory Status: Ambulatory Discharge Destination: Home Transportation: Private Auto Accompanied By: spouse Schedule Follow-up Appointment: Yes Clinical Summary of Care: Patient Declined Electronic Signature(s) Signed: 09/09/2021 5:56:57 PM By: Levan Hurst RN, BSN Entered By: Levan Hurst on 09/09/2021 17:52:28 -------------------------------------------------------------------------------- Lower Extremity Assessment Details Patient Name: Date of Service: [name] RD C. 09/09/2021 1:00 PM Medical Record Number: [id number] Patient Account Number: [id number] Date of Birth/Sex: Treating RN: [birth year] (75 y.o. [name] Primary Care Ameerah Huffstetler: Yaakov Guthrie Other Clinician: Referring Sophiamarie Nease: Treating Donzel Romack/Extender: [name] in Treatment: 12 Edema Assessment Assessed: Shirlyn Goltz: No] Patrice Paradise: No] Edema: [Left: N] [Right: o] Calf Left: Right: Point of Measurement: 34 cm From Medial Instep 36.6 cm Ankle Left: Right: Point of Measurement: 13 cm From Medial Instep 24.5 cm Vascular Assessment Pulses: Dorsalis Pedis Palpable: [Right:Yes] Electronic Signature(s) Signed: 09/09/2021 5:56:57 PM By: Levan Hurst RN, BSN Entered By: Levan Hurst on 09/09/2021 13:57:23 -------------------------------------------------------------------------------- Multi Wound Chart Details Patient Name: Date of Service: [name] RD C. 09/09/2021 1:00 PM Medical Record Number: [id number] Patient Account Number: [id number] Date of Birth/Sex: Treating RN: 05-24-1946 (76 y.o. [name] Primary Care Thaily Hackworth: Yaakov Guthrie Other Clinician: Referring Miron Marxen: Treating Cyrah Mclamb/Extender: [name] in Treatment: 12 Vital  Signs Height(in): 75 Pulse(bpm): 58 Weight(lbs): 210 Blood Pressure(mmHg): 168/93 Body Mass Index(BMI): 31 Temperature(F): 98.4 Respiratory Rate(breaths/min): 16 Photos: [N/A:N/A] Right, Lateral Foot N/A N/A Wound Location: Gradually Appeared N/A N/A Wounding Event: Neuropathic Ulcer-Non Diabetic N/A N/A Primary Etiology: Glaucoma, Chronic Obstructive N/A N/A Comorbid History: Pulmonary Disease (COPD), Sleep Apnea, Hypertension, Neuropathy, Received Chemotherapy, Received Radiation 03/30/2021 N/A N/A Date Acquired: 12 N/A N/A Weeks of Treatment: Open N/A N/A Wound Status:  0x0x0 N/A N/A Measurements L x W x D (cm) 0 N/A N/A A (cm) : rea 0 N/A N/A Volume (cm) : 100.00% N/A N/A % Reduction in Area: 100.00% N/A N/A % Reduction in Volume: Full Thickness Without Exposed N/A N/A Classification: Support Structures None Present N/A N/A Exudate Amount: Distinct, outline attached N/A N/A Wound Margin: None Present (0%) N/A N/A Granulation Amount: None Present (0%) N/A N/A Necrotic Amount: Fascia: No N/A N/A Exposed Structures: Fat Layer (Subcutaneous Tissue): No Tendon: No Muscle: No Joint: No Bone: No Large (67-100%) N/A N/A Epithelialization: Treatment Notes Electronic Signature(s) Signed: 09/09/2021 4:56:16 PM By: Linton Ham MD Signed: 09/09/2021 5:56:57 PM By: Levan Hurst RN, BSN Entered By: Linton Ham on 09/09/2021 14:19:43 -------------------------------------------------------------------------------- Multi-Disciplinary Care Plan Details Patient Name: Date of Service: [name] RD C. 09/09/2021 1:00 PM Medical Record Number: [id number] Patient Account Number: [id number] Date of Birth/Sex: Treating RN: [birth year] (75 y.o. [name] Primary Care Brittania Sudbeck: Yaakov Guthrie Other Clinician: Referring Riordan Walle: Treating Kirt Chew/Extender: [name] in Treatment: 12 Active Inactive Wound/Skin  Impairment Nursing Diagnoses: Impaired tissue integrity Goals: Patient/caregiver will verbalize understanding of skin care regimen Date Initiated: 06/11/2021 Target Resolution Date: 10/09/2021 Goal Status: Active Ulcer/skin breakdown will have a volume reduction of 30% by week 4 Date Initiated: 06/11/2021 Date Inactivated: 07/15/2021 Target Resolution Date: 07/17/2021 Goal Status: Met Ulcer/skin breakdown will have a volume reduction of 50% by week 8 Date Initiated: 07/15/2021 Date Inactivated: 08/12/2021 Target Resolution Date: 08/14/2021 Goal Status: Met Interventions: Assess patient/caregiver ability to obtain necessary supplies Assess patient/caregiver ability to perform ulcer/skin care regimen upon admission and as needed Assess ulceration(s) every visit Notes: Electronic Signature(s) Signed: 09/09/2021 5:56:57 PM By: Levan Hurst RN, BSN Entered By: Levan Hurst on 09/09/2021 17:50:53 -------------------------------------------------------------------------------- Pain Assessment Details Patient Name: Date of Service: [name] RD C. 09/09/2021 1:00 PM Medical Record Number: [id number] Patient Account Number: [id number] Date of Birth/Sex: Treating RN: 31-May-1946 (75 y.o. [name] Primary Care Saniyyah Elster: Yaakov Guthrie Other Clinician: Referring Alayha Babineaux: Treating Alven Alverio/Extender: [name] in Treatment: 12 Active Problems Location of Pain Severity and Description of Pain Patient Has Paino No Site Locations Pain Management and Medication Current Pain Management: Electronic Signature(s) Signed: 09/09/2021 5:56:57 PM By: Levan Hurst RN, BSN Entered By: Levan Hurst on 09/09/2021 13:57:15 -------------------------------------------------------------------------------- Patient/Caregiver Education Details Patient Name: Date of Service: [name] RD C. 9/21/2022andnbsp1:00 PM Medical Record Number: [id number] Patient Account  Number: [id number] Date of Birth/Gender: Treating RN: 26-Mar-1946 (75 y.o. [name] Primary Care Physician: Yaakov Guthrie Other Clinician: Referring Physician: Treating Physician/Extender: [name] in Treatment: 12 Education Assessment Education Provided To: Patient Education Topics Provided Wound/Skin Impairment: Methods: Explain/Verbal Responses: State content correctly [employer]) Signed: 09/09/2021 5:56:57 PM By: Levan Hurst RN, BSN Entered By: Levan Hurst on 09/09/2021 14:10:47 -------------------------------------------------------------------------------- Wound Assessment Details Patient Name: Date of Service: [name] RD C. 09/09/2021 1:00 PM Medical Record Number: [id number] Patient Account Number: [id number] Date of Birth/Sex: Treating RN: 08-14-1946 (75 y.o. [name] Primary Care Brittanni Cariker: Yaakov Guthrie Other Clinician: Referring Tionna Gigante: Treating Baylen Dea/Extender: [name] in Treatment: 12 Wound Status Wound Number: 4 Primary Neuropathic Ulcer-Non Diabetic Etiology: Wound Location: Right, Lateral Foot Wound Open Wounding Event: Gradually Appeared Status: Date Acquired: 03/30/2021 Comorbid Glaucoma, Chronic Obstructive Pulmonary Disease (COPD), Sleep Weeks Of Treatment: 12 History: Apnea, Hypertension, Neuropathy, Received Chemotherapy, Clustered Wound: No Received Radiation Photos Wound Measurements Length: (cm) Width: (cm) Depth: (cm) Area: (cm)  Volume: (cm) 0 % Reduction in Area: 100% 0 % Reduction in Volume: 100% 0 Epithelialization: Large (67-100%) 0 Tunneling: No 0 Undermining: No Wound Description Classification: Full Thickness Without Exposed Support Structures Wound Margin: Distinct, outline attached Exudate Amount: None Present Foul Odor After Cleansing: No Slough/Fibrino No Wound Bed Granulation Amount: None Present (0%) Exposed  Structure Necrotic Amount: None Present (0%) Fascia Exposed: No Fat Layer (Subcutaneous Tissue) Exposed: No Tendon Exposed: No Muscle Exposed: No Joint Exposed: No Bone Exposed: No Electronic Signature(s) Signed: 09/09/2021 5:56:57 PM By: Levan Hurst RN, BSN Entered By: Levan Hurst on 09/09/2021 14:02:00 -------------------------------------------------------------------------------- [place] Details Patient Name: Date of Service: [name] RD C. 09/09/2021 1:00 PM Medical Record Number: [id number] Patient Account Number: [id number] Date of Birth/Sex: Treating RN: May 23, 1946 (75 y.o. [name] Primary Care Keigan Girten: Yaakov Guthrie Other Clinician: Referring Urbano Milhouse: Treating Indie Boehne/Extender: [name] in Treatment: 12 Vital Signs Time Taken: 13:56 Temperature (F): 98.4 Height (in): 69 Pulse (bpm): 93 Weight (lbs): 210 Respiratory Rate (breaths/min): 16 Body Mass Index (BMI): 31 Blood Pressure (mmHg): 168/93 Reference Range: 80 - 120 mg / dl Electronic Signature(s) Signed: 09/09/2021 5:56:57 PM By: Levan Hurst RN, BSN Entered By: Levan Hurst on 09/09/2021 13:57:07

## 2021-09-10 NOTE — Progress Notes (Signed)
[name] ([id number]) Visit Report for 09/09/2021 HPI Details Patient Name: Date of Service: [name], [name] 09/09/2021 1:00 PM Medical Record Number: [id number] Patient Account Number: [id number] Date of Birth/Sex: Treating RN: [birth year] (75 y.o. 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 He states this started a little over 2 months ago. This is now the third time he has developed a wound to this area. He followed with podiatry for this issue and has been using Betadine daily to it. He recently switched to silver alginate a week ago. He has been on doxycycline since May For this issue and just recently started a course of Keflex prescribed in the ED on 6/18 For possible cellulitis. Currently denies signs of infection. He uses a front offloading shoe to help with relieving the pressure associated with the wound formation. He reports minimal drainage. He does not have sensation to his feet and denies any pain. Currently denies signs of infection. 6/29; this is a patient with chemotherapy-induced neuropathy. We had in clinic last year he was readmitted to our clinic last week. He has an area on the right fifth metatarsal head. Dr. Heber Alma admitted to the to the clinic last week started him on Santyl they are using this daily. The wound started in March she went to podiatry. They are using Betadine to this I am not really sure about the benefit of Betadine in this type of situation. There is not appear to be any active infection. He is using a forefoot offloading boot 7/6; wound on the right fifth metatarsal  head. Using Santyl daily. He is using a forefoot offloading boot but claims to be staying off this religiously Again talk to me about a vacation they had planned for mid August. They have a cancellation insurance and want to go ahead and put this in. I asked him to see if there is a specific form they want filled out. It does seem unlikely that he will be healed by that time [August 12]. Furthermore there are apparently no elevators they be going up stairs. 7/13; right fifth met head. Still not a viable surface using Hydrofera Blue and Santyl. Offloading in a forefoot offloading 7/20; right fifth metatarsal head. Surface looks better. Dimensions down by 4 x 3 mm. We will moved to straight [facility] with a total contact cast today His wife asked me to look at some erythema on the left anterior lower leg. It is indeed warm but not tender [however he is neuropathic]. He also has some degree of venous insufficiency I wonder some of this could be stasis dermatitis 7/22; patient presents for obligatory cast change. He reports not issues with the cast placed earlier this week. He tolerated this well. He has no complaints or questions today. 7/29; the patient has no complaints. The wound actually looks quite good rim of epithelialization nice healthy granulation. But only minimal improvement in size 8/4; patient presents for follow-up. He has no issues or complaints today. He has tolerated the cast well over the past week. He denies signs of infection. 8/5; patient was seen yesterday. He states that the cast was uncomfortable and rubbing at the top. He  is here for evaluation of replacement. 07/28/2021 upon evaluation today patient presents for follow-up he was last seen on the fifth for his last measurement. Fortunately there is no signs of active infection at this time which is great news and overall very pleased with where we stand. With that being said from the starting point of where he is  managing this is definitely showing signs of improvement which is great news there is a little bit of hypergranulation I think silver nitrate could be helpful today. 8/17; wound is contracting nicely on the right fifth metatarsal head. Using [facility] and a total contact cast this is his second go round with this type of wound in this area. We previously had him in this clinic and heal them out lasting about 9 months. He apparently saw Dr. Posey Pronto and Dr. Paulla Dolly for routine and prophylactic nail care. They mention there might be a surgical option for his subluxing metatarsal head in this area 8/24; wound is down slightly in surface area however he has a considerable amount of maceration around the wound extending into the dorsal foot at the base of his toes and even proximally. There is not enough drainage from the small open area currently to do this. I wonder whether he simply had water go down this in the shower although he is using a cast protector. The other thing is I wonder if he is sitting out in the sun and sweating excessively. 8/31; wound continues to go down gradually and looks healthy. They were able to discover that there was a leak in his cast protector creating the macerated skin on the bottom of his foot last week 9/7; arrives today with the wound measuring smaller however at bedside exam there was clearly extension laterally subcutaneously with a large amount of purulence relative to the wound size. I used a #5 curette to clean up the wound callus and subcutaneous debris. A culture of the purulent drainage coming predominantly laterally over the fifth metatarsal head was obtained. We have not cultured this wound before. He came into the clinic on doxycycline. He tells me that he cannot take pills orally he has to either crush them open capsules or take suspensions. I am not sure you can do this with doxycycline however 9/14; culture of the wound on the right fifth met head last  week grew abundant Enterobacter cloacae. The organism was not sensitive to Augmentin therefore I changed him to cefdinir 300 twice daily yesterday. I gave him a weeks worth. The wound has contracted quite a bit. We took him out of the total contact cast last week as well 9/21; right fifth metatarsal head which was really looking quite good last week has healed over. Skin in the area looks somewhat fragile. There was still some eschar on this that I went ahead and remove there is no open wound. The metatarsal head underneath here still seems somewhat subluxed to me Electronic Signature(s) Signed: 09/09/2021 4:56:16 PM By: Linton Ham MD Entered By: Linton Ham on 09/09/2021 14:20:29 -------------------------------------------------------------------------------- Physical Exam Details Patient Name: Date of Service: [name] RD C. 09/09/2021 1:00 PM Medical Record Number: [id number] Patient Account Number: [id number] Date of Birth/Sex: Treating RN: 1946/12/11 (75 y.o. [name] Primary Care Provider: Yaakov Everett Other Clinician: Referring Provider: Treating Provider/Extender: [name] in Treatment: 12 Constitutional Patient is hypertensive.. Pulse regular and within target range for patient.Marland Kitchen Respirations regular, non-labored and within target range.. Temperature is normal and within the target  range for the patient.Marland Kitchen Appears in no distress. Cardiovascular Pedal pulses are palpable. Notes Wound exam; everything is closed here. I gently removed eschar over the surface of this with pickups and a #15 scalpel there is no open wound. Everything looks kind of friable but the skin looks healthy otherwise. Certainly no evidence of infection Electronic Signature(s) Signed: 09/09/2021 4:56:16 PM By: Linton Ham MD Entered By: Linton Ham on 09/09/2021  14:21:36 -------------------------------------------------------------------------------- Physician Orders Details Patient Name: Date of Service: [name] RD C. 09/09/2021 1:00 PM Medical Record Number: [id number] Patient Account Number: [id number] Date of Birth/Sex: Treating RN: Jul 25, 1946 (75 y.o. 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 - with Dr. Dellia Nims Return A Bathing/ Shower/ Hygiene May shower with protection but do not get wound dressing(s) wet. Edema Control - Lymphedema / SCD / Other Elevate legs to the level of the heart or above for 30 minutes daily and/or when sitting, a frequency of: - throughout the day Avoid standing for long periods of time. Off-Loading Open toe surgical shoe to: - right foot Additional Orders / Instructions Other: - Continue to keep right lateral foot padded with foam for protection for the next 2 weeks. Electronic Signature(s) Signed: 09/09/2021 4:56:16 PM By: Linton Ham MD Signed: 09/09/2021 5:56:57 PM By: Levan Hurst RN, BSN Entered By: Levan Hurst on 09/09/2021 14:17:19 -------------------------------------------------------------------------------- Problem List Details Patient Name: Date of Service: [name] RD C. 09/09/2021 1:00 PM Medical Record Number: [id number] Patient Account Number: [id number] Date of Birth/Sex: Treating RN: 08/11/1946 (75 y.o. 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 06/11/2021 No Yes J44.9 Chronic obstructive pulmonary disease, unspecified 06/11/2021 No Yes G47.33 Obstructive sleep apnea (adult) (pediatric) 06/11/2021 No Yes Z86.718 Personal history of other venous thrombosis and embolism 06/11/2021 No Yes L03.116 Cellulitis of left lower limb 07/08/2021 No Yes Inactive Problems Resolved Problems Electronic Signature(s) Signed: 09/09/2021 4:56:16 PM By: Linton Ham MD Entered By: Linton Ham on 09/09/2021 14:19:35 -------------------------------------------------------------------------------- Progress Note Details Patient Name: Date of Service: [name] RD C. 09/09/2021 1:00 PM Medical Record Number: [id number] Patient Account Number: [id number] Date of Birth/Sex: Treating RN: [birth year] (75 y.o. 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 He states this started a little over 2 months ago. This is now the third time he has developed a wound to this area. He followed with podiatry for this issue and has been using Betadine daily to it. He recently switched to silver alginate a week ago. He has been on doxycycline since May For this issue and just recently started  a course of Keflex prescribed in the ED on 6/18 For possible cellulitis. Currently denies  signs of infection. He uses a front offloading shoe to help with relieving the pressure associated with the wound formation. He reports minimal drainage. He does not have sensation to his feet and denies any pain. Currently denies signs of infection. 6/29; this is a patient with chemotherapy-induced neuropathy. We had in clinic last year he was readmitted to our clinic last week. He has an area on the right fifth metatarsal head. Dr. Heber Gotham admitted to the to the clinic last week started him on Santyl they are using this daily. The wound started in March she went to podiatry. They are using Betadine to this I am not really sure about the benefit of Betadine in this type of situation. There is not appear to be any active infection. He is using a forefoot offloading boot 7/6; wound on the right fifth metatarsal head. Using Santyl daily. He is using a forefoot offloading boot but claims to be staying off this religiously Again talk to me about a vacation they had planned for mid August. They have a cancellation insurance and want to go ahead and put this in. I asked him to see if there is a specific form they want filled out. It does seem unlikely that he will be healed by that time [August 12]. Furthermore there are apparently no elevators they be going up stairs. 7/13; right fifth met head. Still not a viable surface using Hydrofera Blue and Santyl. Offloading in a forefoot offloading 7/20; right fifth metatarsal head. Surface looks better. Dimensions down by 4 x 3 mm. We will moved to straight [facility] with a total contact cast today His wife asked me to look at some erythema on the left anterior lower leg. It is indeed warm but not tender [however he is neuropathic]. He also has some degree of venous insufficiency I wonder some of this could be stasis dermatitis 7/22; patient presents for obligatory cast change. He reports not issues with the cast placed earlier this week. He tolerated this  well. He has no complaints or questions today. 7/29; the patient has no complaints. The wound actually looks quite good rim of epithelialization nice healthy granulation. But only minimal improvement in size 8/4; patient presents for follow-up. He has no issues or complaints today. He has tolerated the cast well over the past week. He denies signs of infection. 8/5; patient was seen yesterday. He states that the cast was uncomfortable and rubbing at the top. He is here for evaluation of replacement. 07/28/2021 upon evaluation today patient presents for follow-up he was last seen on the fifth for his last measurement. Fortunately there is no signs of active infection at this time which is great news and overall very pleased with where we stand. With that being said from the starting point of where he is managing this is definitely showing signs of improvement which is great news there is a little bit of hypergranulation I think silver nitrate could be helpful today. 8/17; wound is contracting nicely on the right fifth metatarsal head. Using [facility] and a total contact cast this is his second go round with this type of wound in this area. We previously had him in this clinic and heal them out lasting about 9 months. He apparently saw Dr. Posey Pronto and Dr. Paulla Dolly for routine and prophylactic nail care. They mention there might be a surgical option for his subluxing metatarsal head in  this area 8/24; wound is down slightly in surface area however he has a considerable amount of maceration around the wound extending into the dorsal foot at the base of his toes and even proximally. There is not enough drainage from the small open area currently to do this. I wonder whether he simply had water go down this in the shower although he is using a cast protector. The other thing is I wonder if he is sitting out in the sun and sweating excessively. 8/31; wound continues to go down gradually and looks healthy. They  were able to discover that there was a leak in his cast protector creating the macerated skin on the bottom of his foot last week 9/7; arrives today with the wound measuring smaller however at bedside exam there was clearly extension laterally subcutaneously with a large amount of purulence relative to the wound size. I used a #5 curette to clean up the wound callus and subcutaneous debris. A culture of the purulent drainage coming predominantly laterally over the fifth metatarsal head was obtained. We have not cultured this wound before. He came into the clinic on doxycycline. He tells me that he cannot take pills orally he has to either crush them open capsules or take suspensions. I am not sure you can do this with doxycycline however 9/14; culture of the wound on the right fifth met head last week grew abundant Enterobacter cloacae. The organism was not sensitive to Augmentin therefore I changed him to cefdinir 300 twice daily yesterday. I gave him a weeks worth. The wound has contracted quite a bit. We took him out of the total contact cast last week as well 9/21; right fifth metatarsal head which was really looking quite good last week has healed over. Skin in the area looks somewhat fragile. There was still some eschar on this that I went ahead and remove there is no open wound. The metatarsal head underneath here still seems somewhat subluxed to me Objective Constitutional Patient is hypertensive.. Pulse regular and within target range for patient.Marland Kitchen Respirations regular, non-labored and within target range.. Temperature is normal and within the target range for the patient.Marland Kitchen Appears in no distress. Vitals Time Taken: 1:56 PM, Height: 69 in, Weight: 210 lbs, BMI: 31, Temperature: 98.4 F, Pulse: 93 bpm, Respiratory Rate: 16 breaths/min, Blood Pressure: 168/93 mmHg. Cardiovascular Pedal pulses are palpable. General Notes: Wound exam; everything is closed here. I gently removed eschar over  the surface of this with pickups and a #15 scalpel there is no open wound. Everything looks kind of friable but the skin looks healthy otherwise. Certainly no evidence of infection Integumentary (Hair, Skin) Wound #4 status is Open. Original cause of wound was Gradually Appeared. The date acquired was: 03/30/2021. The wound has been in treatment 12 weeks. The wound is located on the Right,Lateral Foot. The wound measures 0cm length x 0cm width x 0cm depth; 0cm^2 area and 0cm^3 volume. There is no tunneling or undermining noted. There is a none present amount of drainage noted. The wound margin is distinct with the outline attached to the wound base. There is no granulation within the wound bed. There is no necrotic tissue within the wound bed. 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 - with Dr. Dellia Nims  Bathing/ Shower/ Hygiene: May shower with protection but do not get wound dressing(s) wet. Edema Control - Lymphedema / SCD / Other: Elevate legs to the level of the heart or above for 30 minutes daily and/or when sitting, a frequency of: - throughout the day Avoid standing for long periods of time. Off-Loading: Open toe surgical shoe to: - right foot Additional Orders / Instructions: Other: - Continue to keep right lateral foot padded with foam for protection for the next 2 weeks. 1. I do not think the patient needs a specific dressing although only bordered foam to 2. I have suggested gauze to separate him from his shoe perhaps continue to wear the forefoot off loader for a week before transitioning into his normal footwear 3. He has some degree of subluxation of the fifth  metatarsal head which might require a surgical review if we cannot maintain skin integrity here. Nevertheless everything is contingent on him being rigorous about careful offloading including no walking in bare feet or stocking feet etc. Electronic Signature(s) Signed: 09/09/2021 4:56:16 PM By: Linton Ham MD Entered By: Linton Ham on 09/09/2021 14:25:06 -------------------------------------------------------------------------------- SuperBill Details Patient Name: Date of Service: [name] RD C. 09/09/2021 Medical Record Number: [id number] Patient Account Number: [id number] Date of Birth/Sex: Treating RN: 03/27/1946 (75 y.o. 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 J44.9 Chronic obstructive pulmonary disease, unspecified G47.33 Obstructive sleep apnea (adult) (pediatric) Z86.718 Personal history of other venous thrombosis and embolism L03.116 Cellulitis of left lower limb Facility Procedures Physician Procedures : CPT4 Code Description Modifier [id number] - WC PHYS LEVEL 3 - EST PT ICD-10 Diagnosis Description 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

## 2021-09-16 ENCOUNTER — Encounter (HOSPITAL_BASED_OUTPATIENT_CLINIC_OR_DEPARTMENT_OTHER): Payer: Medicare Other | Admitting: Internal Medicine

## 2021-09-22 ENCOUNTER — Other Ambulatory Visit: Payer: Self-pay

## 2021-09-22 ENCOUNTER — Ambulatory Visit (HOSPITAL_COMMUNITY)
Admission: RE | Admit: 2021-09-22 | Discharge: 2021-09-22 | Disposition: A | Discharge status: Home / Self Care | Payer: Medicare Other | Source: Ambulatory Visit | Attending: Internal Medicine | Admitting: Internal Medicine

## 2021-09-22 ENCOUNTER — Inpatient Hospital Stay: Payer: Medicare Other | Attending: Internal Medicine

## 2021-09-22 DIAGNOSIS — C3490 Malignant neoplasm of unspecified part of unspecified bronchus or lung: Secondary | ICD-10-CM | POA: Diagnosis present

## 2021-09-22 DIAGNOSIS — Z902 Acquired absence of lung [part of]: Secondary | ICD-10-CM | POA: Diagnosis not present

## 2021-09-22 DIAGNOSIS — Z85118 Personal history of other malignant neoplasm of bronchus and lung: Secondary | ICD-10-CM | POA: Insufficient documentation

## 2021-09-22 LAB — CBC WITH DIFFERENTIAL (CANCER CENTER ONLY)
Abs Immature Granulocytes: 0.01 10*3/uL (ref 0.00–0.07)
Basophils Absolute: 0.1 10*3/uL (ref 0.0–0.1)
Basophils Relative: 1 %
Eosinophils Absolute: 0.2 10*3/uL (ref 0.0–0.5)
Eosinophils Relative: 2 %
HCT: 45 % (ref 39.0–52.0)
Hemoglobin: 15.3 g/dL (ref 13.0–17.0)
Immature Granulocytes: 0 %
Lymphocytes Relative: 21 %
Lymphs Abs: 1.5 10*3/uL (ref 0.7–4.0)
MCH: 31.7 pg (ref 26.0–34.0)
MCHC: 34 g/dL (ref 30.0–36.0)
MCV: 93.4 fL (ref 80.0–100.0)
Monocytes Absolute: 0.8 10*3/uL (ref 0.1–1.0)
Monocytes Relative: 11 %
Neutro Abs: 4.6 10*3/uL (ref 1.7–7.7)
Neutrophils Relative %: 65 %
Platelet Count: 202 10*3/uL (ref 150–400)
RBC: 4.82 MIL/uL (ref 4.22–5.81)
RDW: 14.5 % (ref 11.5–15.5)
WBC Count: 7.1 10*3/uL (ref 4.0–10.5)
nRBC: 0 % (ref 0.0–0.2)

## 2021-09-22 LAB — CMP (CANCER CENTER ONLY)
ALT: 24 U/L (ref 0–44)
AST: 26 U/L (ref 15–41)
Albumin: 3.9 g/dL (ref 3.5–5.0)
Alkaline Phosphatase: 94 U/L (ref 38–126)
Anion gap: 12 (ref 5–15)
BUN: 9 mg/dL (ref 8–23)
CO2: 24 mmol/L (ref 22–32)
Calcium: 9.3 mg/dL (ref 8.9–10.3)
Chloride: 101 mmol/L (ref 98–111)
Creatinine: 0.74 mg/dL (ref 0.61–1.24)
GFR, Estimated: >60 mL/min (ref >60)
Glucose, Bld: 103 mg/dL — ABNORMAL HIGH (ref 70–99)
Potassium: 4.2 mmol/L (ref 3.5–5.1)
Sodium: 137 mmol/L (ref 135–145)
Total Bilirubin: 0.5 mg/dL (ref 0.3–1.2)
Total Protein: 7.2 g/dL (ref 6.5–8.1)

## 2021-09-22 MED ORDER — IOHEXOL 350 MG/ML SOLN
75.0000 mL | Freq: Once | INTRAVENOUS | Status: AC | PRN
  Administered 2021-09-22: 75 mL via INTRAVENOUS

## 2021-09-23 ENCOUNTER — Encounter (HOSPITAL_BASED_OUTPATIENT_CLINIC_OR_DEPARTMENT_OTHER): Payer: Medicare Other | Attending: Internal Medicine | Admitting: Internal Medicine

## 2021-09-23 DIAGNOSIS — L97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 with unspecified severity: Secondary | ICD-10-CM | POA: Diagnosis not present

## 2021-09-23 DIAGNOSIS — Z86718 Personal history of other venous thrombosis and embolism: Secondary | ICD-10-CM | POA: Insufficient documentation

## 2021-09-23 DIAGNOSIS — Z85118 Personal history of other malignant neoplasm of bronchus and lung: Secondary | ICD-10-CM | POA: Diagnosis not present

## 2021-09-23 DIAGNOSIS — L03116 Cellulitis of left lower limb: Secondary | ICD-10-CM | POA: Insufficient documentation

## 2021-09-23 DIAGNOSIS — G620 Drug-induced polyneuropathy: Secondary | ICD-10-CM | POA: Diagnosis not present

## 2021-09-24 ENCOUNTER — Other Ambulatory Visit: Payer: Self-pay

## 2021-09-24 ENCOUNTER — Inpatient Hospital Stay (HOSPITAL_BASED_OUTPATIENT_CLINIC_OR_DEPARTMENT_OTHER): Payer: Medicare Other | Admitting: Internal Medicine

## 2021-09-24 VITALS — BP 138/84 | HR 88 | Temp 97.8°F | Resp 20 | Ht 69.0 in | Wt 219.7 lb

## 2021-09-24 DIAGNOSIS — C3490 Malignant neoplasm of unspecified part of unspecified bronchus or lung: Secondary | ICD-10-CM

## 2021-09-24 DIAGNOSIS — Z85118 Personal history of other malignant neoplasm of bronchus and lung: Secondary | ICD-10-CM | POA: Diagnosis not present

## 2021-09-24 NOTE — Addendum Note (Signed)
Addended by: Ardeen Garland on: 09/24/2021 03:21 PM   Modules accepted: Orders

## 2021-09-24 NOTE — Progress Notes (Signed)
[place] [phone]   [phone]  OFFICE PROGRESS NOTE  Vernie Shanks, MD [place] [address]  DIAGNOSIS: Stage IA (T1a, N0, M0) non-small cell lung cancer, adenocarcinoma diagnosed in January 2018  PRIOR THERAPY: Status post right lower lobe superior segmentectomy with lymph node dissection under the care of Dr. Roxan Hockey.  CURRENT THERAPY: Observation.  INTERVAL HISTORY: [name] 75 y.o. male returns to the clinic today for follow-up visit accompanied by his wife.  The patient is feeling fine today with no concerning complaints except for an ulcer that developed several months ago and currently under the treatment of the wound clinic by Dr. Dellia Nims.  He denied having any current chest pain, shortness of breath, cough or hemoptysis.  He denied having any fever or chills.  He has no nausea, vomiting, diarrhea or constipation.  He has no headache or visual changes.  He has no weight loss or night sweats.  He is here today for evaluation with repeat CT scan of the chest for restaging of his disease.  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  MEDICATIONS:  Current Outpatient Medications  Medication Sig Dispense Refill   Bacillus Coagulans-Inulin (PROBIOTIC) 1-250 BILLION-MG CAPS Take 1 capsule by mouth daily.     Brimonidine Tartrate (LUMIFY) 0.025 % SOLN 1 drop into affected eye  as needed     carvedilol (COREG) 3.125 MG tablet Take 3.125 mg by mouth  2 (two) times daily with a meal.     cephALEXin (KEFLEX) 250 MG/5ML suspension Take by mouth.     doxycycline (VIBRA-TABS) 100 MG tablet Take 1 tablet (100 mg total) by mouth 2 (two) times daily. 60 tablet 0   doxycycline (VIBRAMYCIN) 100 MG capsule Take 100 mg by mouth 2 (two) times daily.     latanoprost (XALATAN) 0.005 % ophthalmic solution Place 1 drop into both eyes at bedtime.     loperamide (IMODIUM A-D) 2 MG tablet 1 tablet as needed     losartan (COZAAR) 50 MG tablet Take 1 tablet by mouth daily.     metroNIDAZOLE (METROGEL) 0.75 % gel Apply topically.     Multiple Vitamin (MULTI VITAMIN) TABS 1 tablet     Multiple Vitamin (MULTIVITAMIN WITH MINERALS) TABS tablet Take 1 tablet by mouth daily.     mupirocin ointment (BACTROBAN) 2 % 1 application     rosuvastatin (CRESTOR) 5 MG tablet Take 5 mg by mouth daily.     SANTYL ointment Apply topically.     SPIRIVA HANDIHALER 18 MCG inhalation capsule 1 capsule daily.     Tiotropium Bromide Monohydrate (SPIRIVA HANDIHALER IN) Inhale 1 Inhaler into the lungs.     warfarin (COUMADIN) 5 MG tablet Take 1 tablet (5 mg total) by mouth as directed. (Patient taking differently: Take 5-7.5 mg by mouth See admin instructions. Monday, Wednesday, and Friday 7.5 mg (1.5  tablets) & 5 mg (1 tablet) all other days.) 125 tablet 4   No current facility-administered medications for this visit.    SURGICAL HISTORY:  Past Surgical History:  Procedure Laterality Date   HERNIA REPAIR     left ingunal   LUNG REMOVAL, PARTIAL Left    SEGMENTECOMY Right 01/12/2017   Procedure: RIGHT SUPERIOR SEGMENTECTOMY;  Surgeon: Melrose Nakayama, MD;  Location: [facility];  Service: Thoracic;  Laterality: Right;   SUBCLAVIAN STENT PLACEMENT     TONSILLECTOMY     VIDEO ASSISTED THORACOSCOPY Right 01/12/2017   Procedure: VIDEO ASSISTED THORACOSCOPY;  Surgeon: Melrose Nakayama, MD;  Location: [facility];  Service: Thoracic;  Laterality: Right;    REVIEW OF SYSTEMS:  A  comprehensive review of systems was negative.   PHYSICAL EXAMINATION: General appearance: alert, cooperative, and no distress Head: Normocephalic, without obvious abnormality, atraumatic Neck: no adenopathy, no JVD, supple, symmetrical, trachea midline, and thyroid not enlarged, symmetric, no tenderness/mass/nodules Lymph nodes: Cervical, supraclavicular, and axillary nodes normal. Resp: clear to auscultation bilaterally Back: symmetric, no curvature. ROM normal. No CVA tenderness. Cardio: regular rate and rhythm, S1, S2 normal, no murmur, click, rub or gallop GI: soft, non-tender; bowel sounds normal; no masses,  no organomegaly Extremities: extremities normal, atraumatic, no cyanosis or edema  ECOG PERFORMANCE STATUS: 1 - Symptomatic but completely ambulatory  Blood pressure 138/84, pulse 88, temperature 97.8 F (36.6 C), temperature source Tympanic, resp. rate 20, height 5\' 9"  (1.753 m), weight 219 lb 11.2 oz (99.7 kg), SpO2 95 %.  LABORATORY DATA: Lab Results  Component Value Date   WBC 7.1 09/22/2021   HGB 15.3 09/22/2021   HCT 45.0 09/22/2021   MCV 93.4 09/22/2021   PLT 202 09/22/2021      Chemistry      Component Value Date/Time   NA 137 09/22/2021 1343   NA 138 09/07/2017 1051   K 4.2 09/22/2021 1343   K 4.7 09/07/2017 1051   CL 101 09/22/2021 1343   CO2 24 09/22/2021 1343   CO2 28 09/07/2017 1051   BUN 9 09/22/2021 1343   BUN 8.6 09/07/2017 1051   CREATININE 0.74 09/22/2021 1343   CREATININE 0.8 09/07/2017 1051      Component Value Date/Time   CALCIUM 9.3 09/22/2021 1343   CALCIUM 9.5 09/07/2017 1051   ALKPHOS 94 09/22/2021 1343   ALKPHOS 107 09/07/2017 1051   AST 26 09/22/2021 1343   AST 42 (H) 09/07/2017 1051   ALT 24 09/22/2021 1343   ALT 36 09/07/2017 1051   BILITOT 0.5 09/22/2021 1343   BILITOT 0.54 09/07/2017 1051       RADIOGRAPHIC STUDIES: CT Chest W Contrast  Result Date: 09/24/2021 CLINICAL DATA:  Non-small cell lung cancer staging EXAM:  CT CHEST WITH CONTRAST TECHNIQUE: Multidetector CT imaging of the chest was performed during intravenous contrast administration. CONTRAST:  41mL OMNIPAQUE IOHEXOL 350 MG/ML SOLN COMPARISON:  CT chest with contrast dated September 23, 2020 FINDINGS: Cardiovascular: Normal heart size. No pericardial effusion. Three-vessel coronary artery calcifications. Atherosclerotic disease of the thoracic aorta. Mediastinum/Nodes: Esophagus and thyroid are unremarkable. No enlarged lymph nodes seen in the chest. Lungs/Pleura: Prior left upper lobectomy and right lower lobe segmentectomy. Remaining central airways are patent. Centrilobular emphysema. Stable solid nodule of the right middle lobe measuring 3 mm on series 5, image 88. No new or enlarging pulmonary nodules. Upper Abdomen: No acute abnormality. Musculoskeletal: Unchanged mild compression deformities of the thoracic spine no aggressive appearing osseous lesions. IMPRESSION: Stable  point treatment changes. No evidence of recurrent or metastatic disease. Hepatic steatosis. Aortic Atherosclerosis (ICD10-I70.0) and Emphysema (ICD10-J43.9). Electronically Signed   By: Yetta Glassman M.D.   On: 09/24/2021 08:32     ASSESSMENT AND PLAN: This is a very pleasant 75 years old white male with stage IA non-small cell lung cancer, adenocarcinoma status post right lower lobe superior segmentectomy with lymph node dissection in January 2018. The patient has been in observation since that time and he is feeling fine today with no concerning complaints. He had repeat CT scan of the chest performed recently.  I personally and independently reviewed the scans and discussed the results with the patient today. His scan showed no concerning findings for disease recurrence or metastasis. I recommended for the patient to continue on observation with repeat CT scan of the chest in 1 year. He was advised to call immediately if he has any other concerning symptoms in the interval. The  patient voices understanding of current disease status and treatment options and is in agreement with the current care plan.  All questions were answered. The patient knows to call the clinic with any problems, questions or concerns. We can certainly see the patient much sooner if necessary.  Disclaimer: This note was dictated with voice recognition software. Similar sounding words can inadvertently be transcribed and may not be corrected upon review.

## 2021-09-29 NOTE — Progress Notes (Signed)
[name], [name] ([id number]) Visit Report for 09/23/2021 Arrival Information Details Patient Name: Date of Service: [name], [name] 09/23/2021 12:45 PM Medical Record Number: [id number] Patient Account Number: [id number] Date of Birth/Sex: Treating RN: [birth year] (75 y.o. [name] Primary Care [name]: [name] Other Clinician: Referring [name]: Treating [name]/Extender: [name] Plan in Treatment: 81 Visit Information History Since Last Visit Added or deleted any medications: No Patient Arrived: Ambulatory Any new allergies or adverse reactions: No Arrival Time: 12:48 Had a fall or experienced change in No Accompanied By: wife activities of daily living that Caleb affect Transfer Assistance: None risk of falls: Patient Identification Verified: Yes Signs or symptoms of abuse/neglect since last visito No Secondary Verification Process Completed: Yes Hospitalized since last visit: No Patient Requires Transmission-Based Precautions: No Implantable device outside of the clinic excluding No Patient Has Alerts: Yes cellular tissue based products placed in the center Patient Alerts: Patient on Blood Thinner since last visit: R ABI: 1.01 TBI: 0.77 Has Dressing in Place as Prescribed: Yes L ABI: 1.22 TBI: 0.87 Pain Present Now: No 05/01/21 Electronic Signature(s) Signed: 09/23/2021 2:29:31 PM By: [name] Entered By: [name] 12:57:16 -------------------------------------------------------------------------------- Clinic Level of Care Assessment Details Patient Name: Date of Service: [name] 09/23/2021 12:45 PM Medical Record Number: [id number] Patient Account Number: [id number] Date of Birth/Sex: Treating RN: [birth year] (75 y.o. [name], [name] Primary Care Renee Erb: [name] Other Clinician: Referring [name]: Treating [name]/Extender: [name] Plan in Treatment:  14 Clinic Level of Care Assessment Items TOOL 4 Quantity Score X- 1 0 Use when only an EandM is performed on FOLLOW-UP visit ASSESSMENTS - Nursing Assessment / Reassessment X- 1 10 Reassessment of Co-morbidities (includes updates in patient status) X- 1 5 Reassessment of Adherence to Treatment Plan ASSESSMENTS - Wound and Skin A ssessment / Reassessment X - Simple Wound Assessment / Reassessment - one wound 1 5 []  - 0 Complex Wound Assessment / Reassessment - multiple wounds []  - 0 Dermatologic / Skin Assessment (not related to wound area) ASSESSMENTS - Focused Assessment X- 1 5 Circumferential Edema Measurements - multi extremities []  - 0 Nutritional Assessment / Counseling / Intervention []  - 0 Lower Extremity Assessment (monofilament, tuning fork, pulses) []  - 0 Peripheral Arterial Disease Assessment (using hand held doppler) ASSESSMENTS - Ostomy and/or Continence Assessment and Care []  - 0 Incontinence Assessment and Management []  - 0 Ostomy Care Assessment and Management (repouching, etc.) PROCESS - Coordination of Care X - Simple Patient / Family Education for ongoing care 1 15 []  - 0 Complex (extensive) Patient / Family Education for ongoing care X- 1 10 Staff obtains Programmer, systems, Records, T Results / Process Orders est []  - 0 Staff telephones HHA, Nursing Homes / Clarify orders / etc []  - 0 Routine Transfer to another Facility (non-emergent condition) []  - 0 Routine Hospital Admission (non-emergent condition) []  - 0 New Admissions / Biomedical engineer / Ordering NPWT Apligraf, etc. , []  - 0 Emergency Hospital Admission (emergent condition) X- 1 10 Simple Discharge Coordination []  - 0 Complex (extensive) Discharge Coordination PROCESS - Special Needs []  - 0 Pediatric / Minor Patient Management []  - 0 Isolation Patient Management []  - 0 Hearing / Language / Visual special needs []  - 0 Assessment of Community assistance (transportation, D/C  planning, etc.) []  - 0 Additional assistance / Altered mentation []  - 0 Support Surface(s) Assessment (bed, cushion, seat, etc.) INTERVENTIONS - Wound Cleansing / Measurement X - Simple Wound  Cleansing - one wound 1 5 []  - 0 Complex Wound Cleansing - multiple wounds X- 1 5 Wound Imaging (photographs - any number of wounds) []  - 0 Wound Tracing (instead of photographs) X- 1 5 Simple Wound Measurement - one wound []  - 0 Complex Wound Measurement - multiple wounds INTERVENTIONS - Wound Dressings X - Small Wound Dressing one or multiple wounds 1 10 []  - 0 Medium Wound Dressing one or multiple wounds []  - 0 Large Wound Dressing one or multiple wounds X- 1 5 Application of Medications - topical []  - 0 Application of Medications - injection INTERVENTIONS - Miscellaneous []  - 0 External ear exam []  - 0 Specimen Collection (cultures, biopsies, blood, body fluids, etc.) []  - 0 Specimen(s) / Culture(s) sent or taken to Lab for analysis []  - 0 Patient Transfer (multiple staff / Civil Service fast streamer / Similar devices) []  - 0 Simple Staple / Suture removal (25 or less) []  - 0 Complex Staple / Suture removal (26 or more) []  - 0 Hypo / Hyperglycemic Management (close monitor of Blood Glucose) []  - 0 Ankle / Brachial Index (ABI) - do not check if billed separately X- 1 5 Vital Signs Has the patient been seen at the hospital within the last three years: Yes Total Score: 95 Level Of Care: New/Established - Level 3 Electronic Signature(s) Signed: 09/29/2021 5:02:53 PM By: Caleb Hammock RN Entered By: [name] on Everett 13:01:59 -------------------------------------------------------------------------------- Lower Extremity Assessment Details Patient Name: Date of Service: Caleb Alken RD C. 09/23/2021 12:45 PM Medical Record Number: [id number] Patient Account Number: [id number] Date of Birth/Sex: Treating RN: [name]:  [name] Other Clinician: Referring Carle Dargan: Treating Ryota Treece/Extender: [name] Plan in Treatment: 14 Edema Assessment Assessed: [name]: No] [name]: Yes] Edema: [Left: N] [Right: o] Calf Left: Right: Point of Measurement: 34 cm From Medial Instep 36.6 cm Ankle Left: Right: Point of Measurement: 13 cm From Medial Instep 24.5 cm Vascular Assessment Pulses: Dorsalis Pedis Palpable: [Right:Yes] Posterior Tibial Palpable: [Right:Yes] Electronic Signature(s) Signed: 09/29/2021 5:02:53 PM By: Caleb Hammock RN Entered By: [name] on Everett 13:00:07 -------------------------------------------------------------------------------- [place] Details Patient Name: Date of Service: Caleb Alken RD C. 09/23/2021 12:45 PM Medical Record Number: [id number] Patient Account Number: [id number] Date of Birth/Sex: Treating RN: [birth year] (75 y.o. [name], [name] Primary Care Eleonor Ocon: [name] Other Clinician: Referring Sally Menard: Treating Mansur Patti/Extender: [name] Plan in Treatment: 14 Active Inactive Wound/Skin Impairment Nursing Diagnoses: Impaired tissue integrity Goals: Patient/caregiver will verbalize understanding of skin care regimen Date Initiated: 06/11/2021 Target Resolution Date: 10/09/2021 Goal Status: Active Ulcer/skin breakdown will have a volume reduction of 30% by week 4 Date Initiated: 06/11/2021 Date Inactivated: 07/15/2021 Target Resolution Date: 07/17/2021 Goal Status: Met Ulcer/skin breakdown will have a volume reduction of 50% by week 8 Date Initiated: 07/15/2021 Date Inactivated: 08/12/2021 Target Resolution Date: 08/14/2021 Goal Status: Met Interventions: Assess patient/caregiver ability to obtain necessary supplies Assess patient/caregiver ability to perform ulcer/skin care regimen upon admission and as needed Assess ulceration(s) every visit Notes: Electronic  Signature(s) Signed: 09/29/2021 5:02:53 PM By: Caleb Hammock RN Entered By: [name] on Everett 13:00:50 -------------------------------------------------------------------------------- Pain Assessment Details Patient Name: Date of Service: Caleb Alken RD C. 09/23/2021 12:45 PM Medical Record Number: [id number] Patient Account Number: [id number] Date of Birth/Sex: Treating RN: Jan 29, 1946 (75 y.o. [name] Primary Care Taedyn Glasscock: [name] Other Clinician: Referring Sammye Staff: Treating Huxley Shurley/Extender: [name] Plan in Treatment: 14 Active Problems Location  of Pain Severity and Description of Pain Patient Has Paino No Site Locations Pain Management and Medication Current Pain Management: Electronic Signature(s) Signed: 09/23/2021 2:29:31 PM By: [name] Signed: 09/24/2021 6:21:03 PM By: Levan Hurst RN, BSN Signed: 09/24/2021 6:21:03 PM By: Levan Hurst RN, BSN Entered By: [name] 12:57:45 -------------------------------------------------------------------------------- Patient/Caregiver Education Details Patient Name: Date of Service: Caleb Alken RD C. 10/5/2022andnbsp12:45 PM Medical Record Number: [id number] Patient Account Number: [id number] Date of Birth/Gender: Treating RN: September 05, 1946 (75 y.o. [name] Primary Care Physician: [name] Other Clinician: Referring Physician: Treating Physician/Extender: [name] Plan in Treatment: 14 Education Assessment Education Provided To: Patient Education Topics Provided Wound/Skin Impairment: Methods: Explain/Verbal Responses: State content correctly [employer]) Signed: 09/29/2021 5:02:53 PM By: Caleb Hammock RN Entered By: [name] on Everett 13:01:06 -------------------------------------------------------------------------------- Vitals Details Patient Name: Date of  Service: Caleb Alken RD C. 09/23/2021 12:45 PM Medical Record Number: [id number] Patient Account Number: [id number] Date of Birth/Sex: Treating RN: [birth year] (75 y.o. [name] Primary Care Zareen Jamison: [name] Other Clinician: Referring Yossi Hinchman: Treating Kahlia Lagunes/Extender: [name] Plan in Treatment: 14 Vital Signs Time Taken: 12:57 Temperature (F): 98.7 Height (in): 69 Pulse (bpm): 82 Weight (lbs): 210 Respiratory Rate (breaths/min): 16 Body Mass Index (BMI): 31 Blood Pressure (mmHg): 145/80 Reference Range: 80 - 120 mg / dl Electronic Signature(s) Signed: 09/23/2021 2:29:31 PM By: [name] Entered By: [name] 12:57:35

## 2021-09-29 NOTE — Progress Notes (Signed)
[name] ([id number]) Visit Report for 09/23/2021 HPI Details Patient Name: Date of Service: [name] 09/23/2021 12:45 PM Medical Record Number: [id number] Patient Account Number: [id number] Date of Birth/Sex: Treating RN: 1946/02/07 (75 y.o. 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 He states this started a little over 2 months ago. This is now the third time he has developed a wound to this area. He followed with podiatry for this issue and has been using Betadine daily to it. He recently switched to silver alginate a week ago. He has been on doxycycline since May For this issue and just recently started a course of Keflex prescribed in the ED on 6/18 For possible cellulitis. Currently denies signs of infection. He uses a front offloading shoe to help with relieving the pressure associated with the wound formation. He reports minimal drainage. He does not have sensation to his feet and denies any pain. Currently denies signs of infection. 6/29; this is a patient with chemotherapy-induced neuropathy. We had in clinic last year he was readmitted to our clinic last week. He has an area on the right fifth metatarsal head. Dr. [name] admitted to the to the clinic last week started him on Santyl they are using this daily. The wound started in March she went to podiatry. They are using Betadine to this I am not really sure about the benefit of Betadine in this type of situation. There is not appear to be any active infection. He is using a forefoot offloading boot 7/6; wound on the right fifth metatarsal  head. Using Santyl daily. He is using a forefoot offloading boot but claims to be staying off this religiously Again talk to me about a vacation they had planned for mid August. They have a cancellation insurance and want to go ahead and put this in. I asked him to see if there is a specific form they want filled out. It does seem unlikely that he will be healed by that time [August 12]. Furthermore there are apparently no elevators they be going up stairs. 7/13; right fifth met head. Still not a viable surface using Hydrofera Blue and Santyl. Offloading in a forefoot offloading 7/20; right fifth metatarsal head. Surface looks better. Dimensions down by 4 x 3 mm. We will moved to straight [facility] with a total contact cast today His wife asked me to look at some erythema on the left anterior lower leg. It is indeed warm but not tender [however he is neuropathic]. He also has some degree of venous insufficiency I wonder some of this could be stasis dermatitis 7/22; patient presents for obligatory cast change. He reports not issues with the cast placed earlier this week. He tolerated this well. He has no complaints or questions today. 7/29; the patient has no complaints. The wound actually looks quite good rim of epithelialization nice healthy granulation. But only minimal improvement in size 8/4; patient presents for follow-up. He has no issues or complaints today. He has tolerated the cast well over the past week. He denies signs of infection. 8/5; patient was seen yesterday. He states that the cast was uncomfortable and rubbing at the top. He  is here for evaluation of replacement. 07/28/2021 upon evaluation today patient presents for follow-up he was last seen on the fifth for his last measurement. Fortunately there is no signs of active infection at this time which is great news and overall very pleased with where we stand. With that being said from the starting point of where he is  managing this is definitely showing signs of improvement which is great news there is a little bit of hypergranulation I think silver nitrate could be helpful today. 8/17; wound is contracting nicely on the right fifth metatarsal head. Using [facility] and a total contact cast this is his second go round with this type of wound in this area. We previously had him in this clinic and heal them out lasting about 9 months. He apparently saw Dr. Posey Pronto and Dr. Paulla Dolly for routine and prophylactic nail care. They mention there might be a surgical option for his subluxing metatarsal head in this area 8/24; wound is down slightly in surface area however he has a considerable amount of maceration around the wound extending into the dorsal foot at the base of his toes and even proximally. There is not enough drainage from the small open area currently to do this. I wonder whether he simply had water go down this in the shower although he is using a cast protector. The other thing is I wonder if he is sitting out in the sun and sweating excessively. 8/31; wound continues to go down gradually and looks healthy. They were able to discover that there was a leak in his cast protector creating the macerated skin on the bottom of his foot last week 9/7; arrives today with the wound measuring smaller however at bedside exam there was clearly extension laterally subcutaneously with a large amount of purulence relative to the wound size. I used a #5 curette to clean up the wound callus and subcutaneous debris. A culture of the purulent drainage coming predominantly laterally over the fifth metatarsal head was obtained. We have not cultured this wound before. He came into the clinic on doxycycline. He tells me that he cannot take pills orally he has to either crush them open capsules or take suspensions. I am not sure you can do this with doxycycline however 9/14; culture of the wound on the right fifth met head last  week grew abundant Enterobacter cloacae. The organism was not sensitive to Augmentin therefore I changed him to cefdinir 300 twice daily yesterday. I gave him a weeks worth. The wound has contracted quite a bit. We took him out of the total contact cast last week as well 9/21; right fifth metatarsal head which was really looking quite good last week has healed over. Skin in the area looks somewhat fragile. There was still some eschar on this that I went ahead and remove there is no open wound. The metatarsal head underneath here still seems somewhat subluxed to me 10/5; 2-week follow-up. Right fifth metatarsal head remains epithelialized although there is some dry flaking skin callus and even a small area of bruising laterally Electronic Signature(s) Signed: 09/24/2021 5:45:05 PM By: Linton Ham MD Entered By: Linton Ham on 09/23/2021 13:30:08 -------------------------------------------------------------------------------- Physical Exam Details Patient Name: Date of Service: Caleb Alken RD C. 09/23/2021 12:45 PM Medical Record Number: [id number] Patient Account Number: [id number] Date of Birth/Sex: Treating RN: [birth year] (75 y.o. [name] Primary Care Provider: Yaakov Everett Other Clinician: Referring Provider: Treating Provider/Extender: [name] in Treatment: 14 Constitutional  Sitting or standing Blood Pressure is within target range for patient.. Pulse regular and within target range for patient.Marland Kitchen Respirations regular, non-labored and within target range.. Temperature is normal and within the target range for the patient.Marland Kitchen Appears in no distress. Notes Wound exam; everything remains closed and epithelialized. Once again I removed some flaking dry skin. There is nothing but skin over the metatarsal head on the right fifth plantar. Laterally almost some bruising but there is no erythema nothing looks infected Electronic Signature(s) Signed:  09/24/2021 5:45:05 PM By: Linton Ham MD Entered By: Linton Ham on 09/23/2021 13:31:02 -------------------------------------------------------------------------------- Physician Orders Details Patient Name: Date of Service: Caleb Alken RD C. 09/23/2021 12:45 PM Medical Record Number: [id number] Patient Account Number: [id number] Date of Birth/Sex: Treating RN: May 12, 1946 (75 y.o. [name], [name] Primary Care Provider: Yaakov Everett Other Clinician: Referring Provider: Treating Provider/Extender: [name] in Treatment: 14 Verbal / Phone Orders: No Diagnosis Coding Discharge From [facility] Discharge from [place] Signature(s) Signed: 09/24/2021 5:45:05 PM By: Linton Ham MD Signed: 09/29/2021 5:02:53 PM By: Rhae Hammock RN Entered By: Rhae Hammock on 09/23/2021 13:00:41 -------------------------------------------------------------------------------- Problem List Details Patient Name: Date of Service: Caleb Alken RD C. 09/23/2021 12:45 PM Medical Record Number: [id number] Patient Account Number: [id number] Date of Birth/Sex: Treating RN: 01-25-1946 (75 y.o. 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 06/11/2021 No Yes J44.9 Chronic obstructive pulmonary disease, unspecified 06/11/2021 No Yes G47.33 Obstructive sleep apnea (adult) (pediatric) 06/11/2021 No Yes Z86.718 Personal history of other venous thrombosis and embolism 06/11/2021 No Yes L03.116 Cellulitis of left lower limb 07/08/2021 No Yes Inactive Problems Resolved Problems Electronic Signature(s) Signed: 09/24/2021 5:45:05 PM By: Linton Ham MD Entered By:  Linton Ham on 09/23/2021 13:29:31 -------------------------------------------------------------------------------- Progress Note Details Patient Name: Date of Service: Caleb Alken RD C. 09/23/2021 12:45 PM Medical Record Number: [id number] Patient Account Number: [id number] Date of Birth/Sex: Treating RN: Sep 27, 1946 (75 y.o. 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 He states this started a little over 2 months ago. This is now the third time he has developed a wound to this area. He followed with podiatry for this issue and has been using Betadine daily to it. He recently switched to silver alginate a week ago. He has been on doxycycline since May For this issue and just recently started a course of Keflex prescribed in the ED on 6/18 For possible cellulitis. Currently denies signs of infection. He uses a front offloading shoe to help with relieving the pressure associated with the wound formation. He reports minimal drainage. He does not have sensation to his feet and denies any pain. Currently denies signs of infection. 6/29; this is a patient with chemotherapy-induced neuropathy. We had in clinic last year he was readmitted to our clinic last week. He has an area on the right fifth metatarsal head. Dr. Heber Duncan admitted to the to the clinic last week started him on Santyl they are using  this daily. The wound started in March she went to podiatry. They are using Betadine to this I am not really sure about the benefit of Betadine in this type of situation. There is not appear to be any active infection. He is  using a forefoot offloading boot 7/6; wound on the right fifth metatarsal head. Using Santyl daily. He is using a forefoot offloading boot but claims to be staying off this religiously Again talk to me about a vacation they had planned for mid August. They have a cancellation insurance and want to go ahead and put this in. I asked him to see if there is a specific form they want filled out. It does seem unlikely that he will be healed by that time [August 12]. Furthermore there are apparently no elevators they be going up stairs. 7/13; right fifth met head. Still not a viable surface using Hydrofera Blue and Santyl. Offloading in a forefoot offloading 7/20; right fifth metatarsal head. Surface looks better. Dimensions down by 4 x 3 mm. We will moved to straight [facility] with a total contact cast today His wife asked me to look at some erythema on the left anterior lower leg. It is indeed warm but not tender [however he is neuropathic]. He also has some degree of venous insufficiency I wonder some of this could be stasis dermatitis 7/22; patient presents for obligatory cast change. He reports not issues with the cast placed earlier this week. He tolerated this well. He has no complaints or questions today. 7/29; the patient has no complaints. The wound actually looks quite good rim of epithelialization nice healthy granulation. But only minimal improvement in size 8/4; patient presents for follow-up. He has no issues or complaints today. He has tolerated the cast well over the past week. He denies signs of infection. 8/5; patient was seen yesterday. He states that the cast was uncomfortable and rubbing at the top. He is here for evaluation of replacement. 07/28/2021 upon evaluation today patient presents for follow-up he was last seen on the fifth for his last measurement. Fortunately there is no signs of active infection at this time which is great news and overall very pleased with where we  stand. With that being said from the starting point of where he is managing this is definitely showing signs of improvement which is great news there is a little bit of hypergranulation I think silver nitrate could be helpful today. 8/17; wound is contracting nicely on the right fifth metatarsal head. Using [facility] and a total contact cast this is his second go round with this type of wound in this area. We previously had him in this clinic and heal them out lasting about 9 months. He apparently saw Dr. Posey Pronto and Dr. Paulla Dolly for routine and prophylactic nail care. They mention there might be a surgical option for his subluxing metatarsal head in this area 8/24; wound is down slightly in surface area however he has a considerable amount of maceration around the wound extending into the dorsal foot at the base of his toes and even proximally. There is not enough drainage from the small open area currently to do this. I wonder whether he simply had water go down this in the shower although he is using a cast protector. The other thing is I wonder if he is sitting out in the sun and sweating excessively. 8/31; wound continues to go down gradually and looks healthy. They were able to discover that there was a  leak in his cast protector creating the macerated skin on the bottom of his foot last week 9/7; arrives today with the wound measuring smaller however at bedside exam there was clearly extension laterally subcutaneously with a large amount of purulence relative to the wound size. I used a #5 curette to clean up the wound callus and subcutaneous debris. A culture of the purulent drainage coming predominantly laterally over the fifth metatarsal head was obtained. We have not cultured this wound before. He came into the clinic on doxycycline. He tells me that he cannot take pills orally he has to either crush them open capsules or take suspensions. I am not sure you can do this with doxycycline  however 9/14; culture of the wound on the right fifth met head last week grew abundant Enterobacter cloacae. The organism was not sensitive to Augmentin therefore I changed him to cefdinir 300 twice daily yesterday. I gave him a weeks worth. The wound has contracted quite a bit. We took him out of the total contact cast last week as well 9/21; right fifth metatarsal head which was really looking quite good last week has healed over. Skin in the area looks somewhat fragile. There was still some eschar on this that I went ahead and remove there is no open wound. The metatarsal head underneath here still seems somewhat subluxed to me 10/5; 2-week follow-up. Right fifth metatarsal head remains epithelialized although there is some dry flaking skin callus and even a small area of bruising laterally Objective Constitutional Sitting or standing Blood Pressure is within target range for patient.. Pulse regular and within target range for patient.Marland Kitchen Respirations regular, non-labored and within target range.. Temperature is normal and within the target range for the patient.Marland Kitchen Appears in no distress. Vitals Time Taken: 12:57 PM, Height: 69 in, Weight: 210 lbs, BMI: 31, Temperature: 98.7 F, Pulse: 82 bpm, Respiratory Rate: 16 breaths/min, Blood Pressure: 145/80 mmHg. General Notes: Wound exam; everything remains closed and epithelialized. Once again I removed some flaking dry skin. There is nothing but skin over the metatarsal head on the right fifth plantar. 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 The  patient can be discharged from the wound care center 2. He is going to have to continue to pad this area aggressively as he is doing. I almost wonder if he is going to have to do this and definitely 3. If he has a quick return to clinic with an open wound among other things were going to have to consider referring him to podiatry for custom made footwear. I also wonder if there is any surgery to help prevent this in the metatarsal head area Electronic Signature(s) Signed: 09/24/2021 5:45:05 PM By: Linton Ham MD Entered By: Linton Ham on 09/23/2021 13:32:13 -------------------------------------------------------------------------------- SuperBill Details Patient Name: Date of Service: Caleb Alken RD C. 09/23/2021 Medical Record Number: [id number] Patient Account Number: [id number] Date of Birth/Sex: Treating RN: 01-07-1946 (75 y.o. 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 J44.9 Chronic  obstructive pulmonary disease, unspecified G47.33 Obstructive sleep apnea (adult) (pediatric) Z86.718 Personal history of other venous thrombosis and embolism L03.116 Cellulitis of left lower limb Facility Procedures CPT4 Code: [id number] Description: 99213 - WOUND CARE VISIT-LEV 3 EST PT Modifier: Quantity: 1 Physician Procedures : CPT4 Code Description Modifier [id number] - WC PHYS LEVEL 2 - EST PT ICD-10 Diagnosis Description 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

## 2021-10-07 ENCOUNTER — Ambulatory Visit: Payer: Medicare Other | Admitting: Podiatry

## 2021-11-02 ENCOUNTER — Ambulatory Visit (INDEPENDENT_AMBULATORY_CARE_PROVIDER_SITE_OTHER): Payer: Medicare Other | Admitting: Podiatry

## 2021-11-02 ENCOUNTER — Encounter: Payer: Self-pay | Admitting: Podiatry

## 2021-11-02 ENCOUNTER — Other Ambulatory Visit: Payer: Self-pay

## 2021-11-02 DIAGNOSIS — D689 Coagulation defect, unspecified: Secondary | ICD-10-CM

## 2021-11-02 DIAGNOSIS — M79675 Pain in left toe(s): Secondary | ICD-10-CM | POA: Diagnosis not present

## 2021-11-02 DIAGNOSIS — B351 Tinea unguium: Secondary | ICD-10-CM | POA: Diagnosis not present

## 2021-11-02 DIAGNOSIS — M79674 Pain in right toe(s): Secondary | ICD-10-CM

## 2021-11-02 DIAGNOSIS — M216X1 Other acquired deformities of right foot: Secondary | ICD-10-CM | POA: Diagnosis not present

## 2021-11-02 NOTE — Progress Notes (Signed)
This patient returns to my office for at risk foot care.  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  The ulcer right foot has resolved.  This patient is unable to cut nails himself since the patient cannot reach his nails.These nails are painful walking and wearing shoes.  This patient presents for at risk foot care today.   Patient presents with a bandage on his right foot.  General Appearance  Alert, conversant and in no acute stress.  Vascular  Dorsalis pedis and posterior tibial  pulses are palpable  bilaterally.  Capillary return is within normal limits  bilaterally. Temperature is within normal limits  bilaterally.  Neurologic  Senn-Weinstein monofilament wire test within normal limits  bilaterally. Muscle power within normal limits bilaterally.  Nails Thick disfigured discolored nails with subungual debris  Hallux nails  bilaterally. No evidence of bacterial infection or drainage bilaterally.  Orthopedic  No limitations of motion  feet .  No crepitus or effusions noted.  No bony pathology or digital deformities noted.  Tailors bunion right foot  Skin  normotropic skin with no porokeratosis noted bilaterally.  No signs of infections or ulcers noted.   Ulcer with bandage covering sub 5th met right foot.  Onychomycosis  Pain in right toes  Pain in left toes  Consent was obtained for treatment procedures.   Mechanical debridement of nails 1-5  bilaterally performed with a nail nipper.  Filed with dremel without incident. Dispense dispersion padding sheet.   Return office visit    10 weeks                 Told patient to return for periodic foot care and evaluation due to potential at risk complications.   Gardiner Barefoot DPM

## 2021-11-25 ENCOUNTER — Encounter (HOSPITAL_BASED_OUTPATIENT_CLINIC_OR_DEPARTMENT_OTHER): Payer: Medicare Other | Attending: Internal Medicine | Admitting: Internal Medicine

## 2021-11-25 DIAGNOSIS — L97511 Non-pressure chronic ulcer of other part of right foot limited to breakdown of skin: Secondary | ICD-10-CM | POA: Insufficient documentation

## 2021-11-25 DIAGNOSIS — Z85118 Personal history of other malignant neoplasm of bronchus and lung: Secondary | ICD-10-CM | POA: Insufficient documentation

## 2021-11-25 DIAGNOSIS — G620 Drug-induced polyneuropathy: Secondary | ICD-10-CM | POA: Insufficient documentation

## 2021-11-25 DIAGNOSIS — Z87891 Personal history of nicotine dependence: Secondary | ICD-10-CM | POA: Diagnosis not present

## 2021-11-25 DIAGNOSIS — J449 Chronic obstructive pulmonary disease, unspecified: Secondary | ICD-10-CM | POA: Insufficient documentation

## 2021-11-25 NOTE — Progress Notes (Signed)
JAHKEEM, KURKA ([id number]) Visit Report for 11/25/2021 HPI Details Patient Name: Date of Service: [name], Caleb RD C. 11/25/2021 12:30 PM Medical Record Number: [id number] Patient Account Number: [id number] Date of Birth/Sex: Treating RN: 1946-08-08 (75 y.o. 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 He states this started a little over 2 months ago. This is now the third time he has developed a wound to this area. He followed with podiatry for this issue and has been using Betadine daily to it. He recently switched to silver alginate a week ago. He has been on doxycycline since May For this issue and just recently started a course of Keflex prescribed in the ED on 6/18 For possible cellulitis. Currently denies signs of infection. He uses a front offloading shoe to help with relieving the pressure associated with the wound formation. He reports minimal drainage. He does not have sensation to his feet and denies any pain. Currently denies signs of infection. 6/29; this is a patient with chemotherapy-induced neuropathy. We had in clinic last year he was readmitted to our clinic last week. He has an area on the right fifth metatarsal head. Dr. Heber Dellroy admitted to the to the clinic last week started him on Santyl they are using this daily. The wound started in March she went to podiatry. They are using Betadine to this I am not really sure about the benefit of Betadine in this type of situation. There is not appear to be any active infection. He is using a forefoot offloading boot 7/6; wound on the right fifth metatarsal  head. Using Santyl daily. He is using a forefoot offloading boot but claims to be staying off this religiously Again talk to me about a vacation they had planned for mid August. They have a cancellation insurance and want to go ahead and put this in. I asked him to see if there is a specific form they want filled out. It does seem unlikely that he will be healed by that time [August 12]. Furthermore there are apparently no elevators they be going up stairs. 7/13; right fifth met head. Still not a viable surface using Hydrofera Blue and Santyl. Offloading in a forefoot offloading 7/20; right fifth metatarsal head. Surface looks better. Dimensions down by 4 x 3 mm. We will moved to straight [facility] with a total contact cast today His wife asked me to look at some erythema on the left anterior lower leg. It is indeed warm but not tender [however he is neuropathic]. He also has some degree of venous insufficiency I wonder some of this could be stasis dermatitis 7/22; patient presents for obligatory cast change. He reports not issues with the cast placed earlier this week. He tolerated this well. He has no complaints or questions today. 7/29; the patient has no complaints. The wound actually looks quite good rim of epithelialization nice healthy granulation. But only minimal improvement in size 8/4; patient presents for follow-up. He has no issues or complaints today. He has tolerated the cast well over the past week. He denies signs of infection. 8/5; patient was seen yesterday. He states that the cast was uncomfortable and rubbing at the top. He  is here for evaluation of replacement. 07/28/2021 upon evaluation today patient presents for follow-up he was last seen on the fifth for his last measurement. Fortunately there is no signs of active infection at this time which is great news and overall very pleased with where we stand. With that being said from the starting point of where he is  managing this is definitely showing signs of improvement which is great news there is a little bit of hypergranulation I think silver nitrate could be helpful today. 8/17; wound is contracting nicely on the right fifth metatarsal head. Using [facility] and a total contact cast this is his second go round with this type of wound in this area. We previously had him in this clinic and heal them out lasting about 9 months. He apparently saw Dr. Posey Pronto and Dr. Paulla Dolly for routine and prophylactic nail care. They mention there might be a surgical option for his subluxing metatarsal head in this area 8/24; wound is down slightly in surface area however he has a considerable amount of maceration around the wound extending into the dorsal foot at the base of his toes and even proximally. There is not enough drainage from the small open area currently to do this. I wonder whether he simply had water go down this in the shower although he is using a cast protector. The other thing is I wonder if he is sitting out in the sun and sweating excessively. 8/31; wound continues to go down gradually and looks healthy. They were able to discover that there was a leak in his cast protector creating the macerated skin on the bottom of his foot last week 9/7; arrives today with the wound measuring smaller however at bedside exam there was clearly extension laterally subcutaneously with a large amount of purulence relative to the wound size. I used a #5 curette to clean up the wound callus and subcutaneous debris. A culture of the purulent drainage coming predominantly laterally over the fifth metatarsal head was obtained. We have not cultured this wound before. He came into the clinic on doxycycline. He tells me that he cannot take pills orally he has to either crush them open capsules or take suspensions. I am not sure you can do this with doxycycline however 9/14; culture of the wound on the right fifth met head last  week grew abundant Enterobacter cloacae. The organism was not sensitive to Augmentin therefore I changed him to cefdinir 300 twice daily yesterday. I gave him a weeks worth. The wound has contracted quite a bit. We took him out of the total contact cast last week as well 9/21; right fifth metatarsal head which was really looking quite good last week has healed over. Skin in the area looks somewhat fragile. There was still some eschar on this that I went ahead and remove there is no open wound. The metatarsal head underneath here still seems somewhat subluxed to me 10/5; 2-week follow-up. Right fifth metatarsal head remains epithelialized although there is some dry flaking skin callus and even a small area of bruising laterally 12/7READMISSION [name] came in with his wife today predominantly his wife was concerned about impending breakdown in the same area on the right fifth metatarsal head plantar aspect. 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 He is not a diabetic. The patient has been in the clinic 2 times previously including his stay in 2021 and normal more recently a prolonged stay  ending in October 2022 for open wounds on the right fifth metatarsal head I believe he is also had a previous opening before he came to our clinic. All in all we have healed this out twice and I think somebody else wants. His wife says that she became concerned about this in the last several weeks. She wondered whether there was subdermal hemorrhage dry flaking skin. Both the patient and his wife agree that they are really at a point where he is a minimal ambulator right now. He is religious with his padding of the wound area because wearing insoles in his shoes and really not doing a heck of a lot with life Electronic Signature(s) Signed: 11/25/2021 3:48:37 PM By: Linton Ham MD Entered By: Linton Ham on 11/25/2021  12:59:04 -------------------------------------------------------------------------------- Physical Exam Details Patient Name: Date of Service: Caleb Alken RD C. 11/25/2021 12:30 PM Medical Record Number: [id number] Patient Account Number: [id number] Date of Birth/Sex: Treating RN: [birth year] (75 y.o. [name] Primary Care Provider: Yaakov Guthrie Other Clinician: Referring Provider: Treating Provider/Extender: [name] in Treatment: 0 Constitutional Sitting or standing Blood Pressure is within target range for patient.. Pulse regular and within target range for patient.Marland Kitchen Respirations regular, non-labored and within target range.. Temperature is normal and within the target range for the patient.Marland Kitchen Appears in no distress. Cardiovascular Pedal pulses palpable and strong bilaterally.. Notes Wound exam; right fifth metatarsal. Under illumination there is some dry skin here but absolutely no evidence of an open wound. In particular there is no subdermal hemorrhage. As usual there is some what seems to be subluxation of the fifth metatarsal head in this area Electronic Signature(s) Signed: 11/25/2021 3:48:37 PM By: Linton Ham MD Entered By: Linton Ham on 11/25/2021 13:00:07 -------------------------------------------------------------------------------- Physician Orders Details Patient Name: Date of Service: Caleb Alken RD C. 11/25/2021 12:30 PM Medical Record Number: [id number] Patient Account Number: [id number] Date of Birth/Sex: Treating RN: [birth year] (75 y.o. [name] Primary Care Provider: Yaakov Guthrie Other Clinician: Referring Provider: Treating Provider/Extender: [name] in Treatment: 0 Verbal / Phone Orders: No Diagnosis Coding Discharge From [facility] Services Discharge from [place] only - no open wounds Consults Podiatry - Dr. Jacqualyn Posey or Dr. March Rummage - Recurrent wound on right 5th  metatarsal (currently healed), refer for possible offloading options or surgical options. Electronic Signature(s) Signed: 11/25/2021 3:48:37 PM By: Linton Ham MD Signed: 11/25/2021 5:24:14 PM By: Levan Hurst RN, BSN Entered By: Levan Hurst on 11/25/2021 12:54:27 Prescription 11/25/2021 -------------------------------------------------------------------------------- [name] Linton Ham MD Patient Name: Provider: 10/10/1946 [id number] Date of Birth: NPI#: Jerilynn Mages [id number] Sex: DEA #: [phone] [id number] Phone #: License #: [place] Patient Address: [place] [address] [place], [place] [address] [place], [place] [address] [phone] Allergies No Known Allergies Provider's Orders Podiatry - Dr. Jacqualyn Posey or Dr. March Rummage - Recurrent wound on right 5th metatarsal (currently healed), refer for possible offloading options or surgical options. Hand Signature: Date(s): Electronic Signature(s) Signed: 11/25/2021 3:48:37 PM By: Linton Ham MD Signed: 11/25/2021 5:24:14 PM By: Levan Hurst RN, BSN Entered By: Levan Hurst on 11/25/2021 12:54:27 -------------------------------------------------------------------------------- Problem List Details Patient Name: Date of Service: Caleb Alken [place] 11/25/2021 12:30 PM Medical Record Number: [id number] Patient Account Number: [id number] Date of Birth/Sex: Treating RN: 02-20-1946 (75 y.o. 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 11/25/2021 12:30 PM Medical Record Number: [id number] Patient Account Number: [id number] Date of Birth/Sex: Treating RN: 1946-11-29 (75 y.o. [name] Primary Care Provider: Yaakov Guthrie Other Clinician: Referring Provider: Treating Provider/Extender: [name] in Treatment: 0 Subjective History of Present Illness (HPI) Admission 6/23 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 He states this started a little over 2 months ago. This is now the third time he has developed a wound to this area. He followed with podiatry for this issue and has been using Betadine daily to it. He recently switched to silver alginate a week ago. He has been on doxycycline since May For this issue and just recently started a course of Keflex prescribed in the ED on 6/18 For possible cellulitis. Currently denies signs of infection. He uses a front offloading shoe to help with relieving the pressure associated with the wound formation. He reports minimal drainage. He does not have sensation to his feet and denies any pain. Currently denies signs of infection. 6/29; this is a patient with chemotherapy-induced neuropathy. We had in clinic last year he was readmitted to our clinic last week. He has an area on the right fifth metatarsal head. Dr. Heber [place] admitted to the to the clinic last week started him on Santyl they are using this daily. The wound started in March she went to podiatry. They are using  Betadine to this I am not really sure about the benefit of Betadine in this type of situation. There is not appear to be any active infection. He is using a forefoot offloading boot 7/6; wound on the right fifth metatarsal head. Using Santyl daily. He is using a forefoot offloading boot but claims to be staying off this religiously Again talk to me about a vacation they had planned for mid August. They have a cancellation insurance and want to go ahead and put this in. I asked him to see if there is a specific form they want filled out. It does seem unlikely that he will be healed by that time [August 12]. Furthermore there are apparently no elevators they be going up stairs. 7/13; right fifth met head. Still not a viable surface using Hydrofera Blue and Santyl. Offloading in a forefoot offloading 7/20; right fifth metatarsal head. Surface looks better. Dimensions down by 4 x 3 mm. We will moved to straight [facility] with a total contact cast today His wife asked me to look at some erythema on the left anterior lower leg. It is indeed warm but not tender [however he is neuropathic]. He also has some degree of venous insufficiency I wonder some of this could be stasis dermatitis 7/22; patient presents for obligatory cast change. He reports not issues with the cast placed earlier this week. He tolerated this well. He has no complaints or questions today. 7/29; the patient has no complaints. The wound actually  looks quite good rim of epithelialization nice healthy granulation. But only minimal improvement in size 8/4; patient presents for follow-up. He has no issues or complaints today. He has tolerated the cast well over the past week. He denies signs of infection. 8/5; patient was seen yesterday. He states that the cast was uncomfortable and rubbing at the top. He is here for evaluation of replacement. 07/28/2021 upon evaluation today patient presents for follow-up he was last seen on the fifth for  his last measurement. Fortunately there is no signs of active infection at this time which is great news and overall very pleased with where we stand. With that being said from the starting point of where he is managing this is definitely showing signs of improvement which is great news there is a little bit of hypergranulation I think silver nitrate could be helpful today. 8/17; wound is contracting nicely on the right fifth metatarsal head. Using [facility] and a total contact cast this is his second go round with this type of wound in this area. We previously had him in this clinic and heal them out lasting about 9 months. He apparently saw Dr. Posey Pronto and Dr. Paulla Dolly for routine and prophylactic nail care. They mention there might be a surgical option for his subluxing metatarsal head in this area 8/24; wound is down slightly in surface area however he has a considerable amount of maceration around the wound extending into the dorsal foot at the base of his toes and even proximally. There is not enough drainage from the small open area currently to do this. I wonder whether he simply had water go down this in the shower although he is using a cast protector. The other thing is I wonder if he is sitting out in the sun and sweating excessively. 8/31; wound continues to go down gradually and looks healthy. They were able to discover that there was a leak in his cast protector creating the macerated skin on the bottom of his foot last week 9/7; arrives today with the wound measuring smaller however at bedside exam there was clearly extension laterally subcutaneously with a large amount of purulence relative to the wound size. I used a #5 curette to clean up the wound callus and subcutaneous debris. A culture of the purulent drainage coming predominantly laterally over the fifth metatarsal head was obtained. We have not cultured this wound before. He came into the clinic on doxycycline. He tells me that  he cannot take pills orally he has to either crush them open capsules or take suspensions. I am not sure you can do this with doxycycline however 9/14; culture of the wound on the right fifth met head last week grew abundant Enterobacter cloacae. The organism was not sensitive to Augmentin therefore I changed him to cefdinir 300 twice daily yesterday. I gave him a weeks worth. The wound has contracted quite a bit. We took him out of the total contact cast last week as well 9/21; right fifth metatarsal head which was really looking quite good last week has healed over. Skin in the area looks somewhat fragile. There was still some eschar on this that I went ahead and remove there is no open wound. The metatarsal head underneath here still seems somewhat subluxed to me 10/5; 2-week follow-up. Right fifth metatarsal head remains epithelialized although there is some dry flaking skin callus and even a small area of bruising laterally 12/7ooREADMISSION [name] came in with his wife today predominantly his wife  was concerned about impending breakdown in the same area on the right fifth metatarsal head plantar aspect. 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 He is not a diabetic. The patient has been in the clinic 2 times previously including his stay in 2021 and normal more recently a prolonged stay ending in October 2022 for open wounds on the right fifth metatarsal head I believe he is also had a previous opening before he came to our clinic. All in all we have healed this out twice and I think somebody else wants. His wife says that she became concerned about this in the last several weeks. She wondered whether there was subdermal hemorrhage dry flaking skin. Both the patient and his wife agree that they are really at a point where he is a minimal ambulator right now. He is religious with his padding of the wound area because  wearing insoles in his shoes and really not doing a heck of a lot with life Patient History Information obtained from Patient. Allergies No Known Allergies Family History Cancer - Mother, Heart Disease - Father,Siblings, Hypertension - Mother,Father,Siblings, No family history of Diabetes, Hereditary Spherocytosis, Kidney Disease, Lung Disease, Seizures, Stroke, Thyroid Problems, Tuberculosis. Social History Former smoker - quit 2005, Marital Status - Married, Alcohol Use - Daily - bottle wine per day, Drug Use - No History, Caffeine Use - Daily - coffee. Medical History Eyes Patient has history of Glaucoma - mild Denies history of Cataracts, Optic Neuritis Respiratory Patient has history of Chronic Obstructive Pulmonary Disease (COPD), Sleep Apnea - no CPAP Cardiovascular Patient has history of Hypertension Neurologic Patient has history of Neuropathy Oncologic Patient has history of Received Chemotherapy, Received Radiation Psychiatric Denies history of Anorexia/bulimia, Confinement Anxiety Hospitalization/Surgery History - left inguinal hernia repair. - lobectomy right and left lung. - mouth cancer surgery. - tonsilectomy. 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 Pulse regular and within target range for patient.Marland Kitchen Respirations regular, non-labored and within target range.. Temperature is normal and within the target range for the patient.Marland Kitchen Appears in no distress. Vitals Time Taken: 12:32 PM, Temperature: 97.9 F, Pulse: 86 bpm, Respiratory Rate: 16 breaths/min, Blood Pressure: 138/82 mmHg. Cardiovascular Pedal pulses palpable and strong  bilaterally.. General Notes: Wound exam; right fifth metatarsal. Under illumination there is some dry skin here but absolutely no evidence of an open wound. In particular there is no subdermal hemorrhage. As usual there is some what seems to be subluxation of the fifth metatarsal head in this area Assessment Active Problems ICD-10 Other idiopathic peripheral autonomic neuropathy Non-pressure chronic ulcer of other part of right foot limited to breakdown of skin Plan Discharge From [facility] Services: Discharge from [place] only - no open wounds Consults ordered were: Podiatry - Dr. Jacqualyn Posey or Dr. March Rummage - Recurrent wound on right 5th metatarsal (currently healed), refer for possible offloading options or surgical options. 1. Fortunately no open wound area 2. However the patient clearly is a minimal ambulator. Has a very minimal quality of life related to fear that this will reopen this area. 3. We agree that sending him to [facility] to see about surgical options that might offload this area also  perhaps customized footwear also would come to discussion. 4. He will continue to aggressively offload this until we can get him seen Electronic Signature(s) Signed: 11/25/2021 3:48:37 PM By: Linton Ham MD Entered By: Linton Ham on 11/25/2021 13:01:32 -------------------------------------------------------------------------------- HxROS Details Patient Name: Date of Service: Caleb Alken RD C. 11/25/2021 12:30 PM Medical Record Number: [id number] Patient Account Number: [id number] Date of Birth/Sex: Treating RN: 1946/12/10 (75 y.o. 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 Gastrointestinal Medical History: Past Medical History Notes: ischemic colitis Musculoskeletal Medical History: Past Medical History Notes: hx polio as child Neurologic Medical History: Positive for: Neuropathy Oncologic Medical History: Positive for: Received Chemotherapy; Received Radiation Past Medical History Notes: hx lung CA right and left lung, hx mouth CA Psychiatric Medical History: Negative for: Anorexia/bulimia; Confinement Anxiety HBO Extended History Items Eyes: Glaucoma Immunizations Pneumococcal Vaccine: Received Pneumococcal Vaccination: Yes Received Pneumococcal Vaccination On or After 60th Birthday: No Implantable Devices Yes Hospitalization / Surgery History Type of Hospitalization/Surgery left inguinal hernia repair lobectomy right and left lung mouth cancer surgery tonsilectomy Family and Social History Cancer: Yes - Mother; Diabetes: No; Heart Disease: Yes - Father,Siblings; Hereditary Spherocytosis: No; Hypertension: Yes - Mother,Father,Siblings; Kidney Disease: No; Lung Disease: No; Seizures: No; Stroke: No; Thyroid Problems: No; Tuberculosis: No; Former smoker - quit 2005; Marital Status - Married; Alcohol Use: Daily - bottle wine per day; Drug Use: No History; Caffeine Use: Daily - coffee; Financial Concerns: No; Food, Clothing or Shelter Needs: No; Support System Lacking: No; Transportation Concerns: No Engineer, maintenance) Signed: 11/25/2021 3:48:37 PM By: Linton Ham MD Signed: 11/25/2021 5:24:14 PM By: Levan Hurst RN, BSN Entered By: Levan Hurst on 11/25/2021  12:36:42 -------------------------------------------------------------------------------- [facility] Details Patient Name: Date of Service: Caleb Alken RD C. 11/25/2021 Medical Record Number: [id number] Patient Account Number: [id number] Date of Birth/Sex: Treating RN: 09-24-1946 (75 y.o. 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

## 2021-11-25 NOTE — Progress Notes (Signed)
JANUEL, DOOLAN ([id number]) Visit Report for 11/25/2021 Allergy List Details Patient Name: Date of Service: CHIDI, SHIRER RD C. 11/25/2021 12:30 PM Medical Record Number: [id number] Patient Account Number: [id number] Date of Birth/Sex: Treating RN: 08-10-1946 (75 y.o. [name] Primary Care Diallo Ponder: Yaakov Guthrie Other Clinician: Referring Trestin Vences: Treating Keshun Berrett/Extender: [name] in Treatment: 0 Allergies Active Allergies No Known Allergies Allergy Notes Electronic Signature(s) Signed: 11/25/2021 5:24:14 PM By: Levan Hurst RN, BSN Entered By: Levan Hurst on 11/25/2021 12:36:11 -------------------------------------------------------------------------------- Arrival Information Details Patient Name: Date of Service: [name] 11/25/2021 12:30 PM Medical Record Number: [id number] Patient Account Number: [id number] Date of Birth/Sex: Treating RN: 03-14-1946 (75 y.o. [name] Primary Care Rechelle Niebla: Yaakov Guthrie Other Clinician: Referring Sherea Liptak: Treating Ayeden Gladman/Extender: [name] in Treatment: 0 Visit Information Patient Arrived: Ambulatory Arrival Time: 12:32 Accompanied By: wife Transfer Assistance: None Patient Identification Verified: Yes Secondary Verification Process Completed: Yes Patient Requires Transmission-Based Precautions: No Patient Has Alerts: No History Since Last Visit Added or deleted any medications: No Any new allergies or adverse reactions: No Had a fall or experienced change in activities of daily living that may affect risk of falls: No Signs or symptoms of abuse/neglect since last visito No Hospitalized since last visit: No Implantable device outside of the clinic excluding cellular tissue based products placed in the center since last visit: No Pain Present Now: No Electronic Signature(s) Signed: 11/25/2021 5:24:14 PM By: Levan Hurst RN, BSN Entered By:  Levan Hurst on 11/25/2021 12:33:06 -------------------------------------------------------------------------------- Clinic Level of Care Assessment Details Patient Name: Date of Service: RAESEAN, BARTOLETTI RD C. 11/25/2021 12:30 PM Medical Record Number: [id number] Patient Account Number: [id number] Date of Birth/Sex: Treating RN: September 06, 1946 (75 y.o. [name] Primary Care Hudsen Fei: Yaakov Guthrie Other Clinician: Referring Ahlana Slaydon: Treating Jassiah Viviano/Extender: [name] in Treatment: 0 Clinic Level of Care Assessment Items TOOL 2 Quantity Score X- 1 0 Use when only an EandM is performed on the INITIAL visit ASSESSMENTS - Nursing Assessment / Reassessment X- 1 20 General Physical Exam (combine w/ comprehensive assessment (listed just below) when performed on new pt. evals) X- 1 25 Comprehensive Assessment (HX, ROS, Risk Assessments, Wounds Hx, etc.) ASSESSMENTS - Wound and Skin A ssessment / Reassessment X - Simple Wound Assessment / Reassessment - one wound 1 5 []  - 0 Complex Wound Assessment / Reassessment - multiple wounds []  - 0 Dermatologic / Skin Assessment (not related to wound area) ASSESSMENTS - Ostomy and/or Continence Assessment and Care []  - 0 Incontinence Assessment and Management []  - 0 Ostomy Care Assessment and Management (repouching, etc.) PROCESS - Coordination of Care X - Simple Patient / Family Education for ongoing care 1 15 []  - 0 Complex (extensive) Patient / Family Education for ongoing care X- 1 10 Staff obtains Programmer, systems, Records, T Results / Process Orders est []  - 0 Staff telephones HHA, Nursing Homes / Clarify orders / etc []  - 0 Routine Transfer to another Facility (non-emergent condition) []  - 0 Routine Hospital Admission (non-emergent condition) []  - 0 New Admissions / Biomedical engineer / Ordering NPWT Apligraf, etc. , []  - 0 Emergency Hospital Admission (emergent condition) X- 1 10 Simple Discharge  Coordination []  - 0 Complex (extensive) Discharge Coordination PROCESS - Special Needs []  - 0 Pediatric / Minor Patient Management []  - 0 Isolation Patient Management []  - 0 Hearing / Language / Visual special needs []  - 0 Assessment of Community assistance (transportation, D/C planning, etc.) []  -  0 Additional assistance / Altered mentation []  - 0 Support Surface(s) Assessment (bed, cushion, seat, etc.) INTERVENTIONS - Wound Cleansing / Measurement []  - 0 Wound Imaging (photographs - any number of wounds) []  - 0 Wound Tracing (instead of photographs) []  - 0 Simple Wound Measurement - one wound []  - 0 Complex Wound Measurement - multiple wounds []  - 0 Simple Wound Cleansing - one wound []  - 0 Complex Wound Cleansing - multiple wounds INTERVENTIONS - Wound Dressings []  - 0 Small Wound Dressing one or multiple wounds []  - 0 Medium Wound Dressing one or multiple wounds []  - 0 Large Wound Dressing one or multiple wounds []  - 0 Application of Medications - injection INTERVENTIONS - Miscellaneous []  - 0 External ear exam []  - 0 Specimen Collection (cultures, biopsies, blood, body fluids, etc.) []  - 0 Specimen(s) / Culture(s) sent or taken to Lab for analysis []  - 0 Patient Transfer (multiple staff / Civil Service fast streamer / Similar devices) []  - 0 Simple Staple / Suture removal (25 or less) []  - 0 Complex Staple / Suture removal (26 or more) []  - 0 Hypo / Hyperglycemic Management (close monitor of Blood Glucose) []  - 0 Ankle / Brachial Index (ABI) - do not check if billed separately Has the patient been seen at the hospital within the last three years: Yes Total Score: 85 Level Of Care: New/Established - Level 3 Electronic Signature(s) Signed: 11/25/2021 5:24:14 PM By: Levan Hurst RN, BSN Entered By: Levan Hurst on 11/25/2021 15:25:46 -------------------------------------------------------------------------------- Encounter Discharge Information Details Patient Name:  Date of Service: [name] 11/25/2021 12:30 PM Medical Record Number: [id number] Patient Account Number: [id number] Date of Birth/Sex: Treating RN: Mar 25, 1946 (75 y.o. [name] Primary Care Lakeem Rozo: Yaakov Guthrie Other Clinician: Referring Keithan Dileonardo: Treating Wynona Duhamel/Extender: [name] in Treatment: 0 Encounter Discharge Information Items Discharge Condition: Stable Ambulatory Status: Ambulatory Discharge Destination: Home Transportation: Private Auto Accompanied By: alone Schedule Follow-up Appointment: Yes Clinical Summary of Care: Patient Declined Electronic Signature(s) Signed: 11/25/2021 5:24:14 PM By: Levan Hurst RN, BSN Entered By: Levan Hurst on 11/25/2021 15:26:43 -------------------------------------------------------------------------------- Multi Wound Chart Details Patient Name: Date of Service: [name] 11/25/2021 12:30 PM Medical Record Number: [id number] Patient Account Number: [id number] Date of Birth/Sex: Treating RN: 1946-11-04 (75 y.o. [name] Primary Care Tristyn Demarest: Yaakov Guthrie Other Clinician: Referring Archana Eckman: Treating Sebastian Lurz/Extender: [name] in Treatment: 0 Vital Signs Height(in): Pulse(bpm): 86 Weight(lbs): Blood Pressure(mmHg): 138/82 Body Mass Index(BMI): Temperature(F): 97.9 Respiratory Rate(breaths/min): 16 Wound Assessments Treatment Notes Electronic Signature(s) Signed: 11/25/2021 3:48:37 PM By: Linton Ham MD Signed: 11/25/2021 5:24:14 PM By: Levan Hurst RN, BSN Entered By: Linton Ham on 11/25/2021 12:53:51 -------------------------------------------------------------------------------- Pain Assessment Details Patient Name: Date of Service: [name] 11/25/2021 12:30 PM Medical Record Number: [id number] Patient Account Number: [id number] Date of Birth/Sex: Treating RN: 26-Dec-1945 (75 y.o. [name] Primary  Care Sheala Dosh: Yaakov Guthrie Other Clinician: Referring Hildreth Orsak: Treating Arelie Kuzel/Extender: [name] in Treatment: 0 Active Problems Location of Pain Severity and Description of Pain Patient Has Paino No Site Locations Pain Management and Medication Current Pain Management: Electronic Signature(s) Signed: 11/25/2021 5:24:14 PM By: Levan Hurst RN, BSN Entered By: Levan Hurst on 11/25/2021 12:38:30 -------------------------------------------------------------------------------- Patient/Caregiver Education Details Patient Name: Date of Service: [name] 12/7/2022andnbsp12:30 PM Medical Record Number: [id number] Patient Account Number: [id number] Date of Birth/Gender: Treating RN: 1946-03-05 (75 y.o. [name] Primary Care Physician: Yaakov Guthrie Other Clinician: Referring Physician:  Treating Physician/Extender: [name] in Treatment: 0 Education Assessment Education Provided To: Patient Education Topics Provided Wound/Skin Impairment: Methods: Explain/Verbal Responses: State content correctly Electronic Signature(s) Signed: 11/25/2021 5:24:14 PM By: Levan Hurst RN, BSN Entered By: Levan Hurst on 11/25/2021 15:25:13 -------------------------------------------------------------------------------- Vitals Details Patient Name: Date of Service: [name] 11/25/2021 12:30 PM Medical Record Number: [id number] Patient Account Number: [id number] Date of Birth/Sex: Treating RN: 01-Oct-1946 (75 y.o. [name] Primary Care Judith Demps: Yaakov Guthrie Other Clinician: Referring Sacora Hawbaker: Treating Dorann Davidson/Extender: [name] in Treatment: 0 Vital Signs Time Taken: 12:32 Temperature (F): 97.9 Pulse (bpm): 86 Respiratory Rate (breaths/min): 16 Blood Pressure (mmHg): 138/82 Reference Range: 80 - 120 mg / dl Electronic Signature(s) Signed: 11/25/2021 5:24:14  PM By: Levan Hurst RN, BSN Entered By: Levan Hurst on 11/25/2021 12:35:43

## 2021-11-25 NOTE — Progress Notes (Signed)
[name], [name] ([id number]) Visit Report for 11/25/2021 Abuse/Suicide Risk Screen Details Patient Name: Date of Service: [name], Caleb RD C. 11/25/2021 12:30 PM Medical Record Number: [id number] Patient Account Number: [id number] Date of Birth/Sex: Treating RN: 02-Nov-1946 (74 y.o. [name] Primary Care Tymeshia Awan: Yaakov Guthrie Other Clinician: Referring Ardythe Klute: Treating Kellin Bartling/Extender: [name] in Treatment: 0 Abuse/Suicide Risk Screen Items Answer ABUSE RISK SCREEN: Has anyone close to you tried to hurt or harm you recentlyo No Do you feel uncomfortable with anyone in your familyo No Has anyone forced you do things that you didnt want to doo No Electronic Signature(s) Signed: 11/25/2021 5:24:14 PM By: Levan Hurst RN, BSN Entered By: Levan Hurst on 11/25/2021 12:36:47 -------------------------------------------------------------------------------- Activities of Daily Living Details Patient Name: Date of Service: [name], Caleb RD C. 11/25/2021 12:30 PM Medical Record Number: [id number] Patient Account Number: [id number] Date of Birth/Sex: Treating RN: [birth year] (75 y.o. [name] Primary Care Beyonca Wisz: Yaakov Guthrie Other Clinician: Referring Erico Stan: Treating Roselee Tayloe/Extender: [name] in Treatment: 0 Activities of Daily Living Items Answer Activities of Daily Living (Please select one for each item) Drive Automobile Completely Able T Medications ake Completely Able Use T elephone Completely Able Care for Appearance Completely Able Use T oilet Completely Able Bath / Shower Completely Able Dress Self Completely Able Feed Self Completely Able Walk Completely Able Get In / Out Bed Completely Able Housework Completely Able Prepare Meals Completely [place] Completely Able Shop for Self Completely Able Electronic Signature(s) Signed: 11/25/2021 5:24:14 PM By: Levan Hurst RN,  BSN Entered By: Levan Hurst on 11/25/2021 12:37:02 -------------------------------------------------------------------------------- Education Screening Details Patient Name: Date of Service: Caleb Alken RD C. 11/25/2021 12:30 PM Medical Record Number: [id number] Patient Account Number: [id number] Date of Birth/Sex: Treating RN: 10/18/1946 (75 y.o. [name] Primary Care Siearra Amberg: Yaakov Guthrie Other Clinician: Referring Santos Hardwick: Treating Winslow Ederer/Extender: [name] in Treatment: 0 Primary Learner Assessed: Patient Learning Preferences/Education Level/Primary Language Learning Preference: Explanation, Demonstration, Printed Material Highest Education Level: College or Above Preferred Language: English Cognitive Barrier Language Barrier: No Translator Needed: No Memory Deficit: No Emotional Barrier: No Cultural/Religious Beliefs Affecting Medical Care: No Physical Barrier Impaired Vision: No Impaired Hearing: No Decreased Hand dexterity: No Knowledge/Comprehension Knowledge Level: High Comprehension Level: High Ability to understand written instructions: High Ability to understand verbal instructions: High Motivation Anxiety Level: Calm Cooperation: Cooperative Education Importance: Acknowledges Need Interest in Health Problems: Asks Questions Perception: Coherent Willingness to Engage in Self-Management High Activities: Readiness to Engage in Self-Management High Activities: Electronic Signature(s) Signed: 11/25/2021 5:24:14 PM By: Levan Hurst RN, BSN Entered By: Levan Hurst on 11/25/2021 12:37:40 -------------------------------------------------------------------------------- Fall Risk Assessment Details Patient Name: Date of Service: Caleb Alken RD C. 11/25/2021 12:30 PM Medical Record Number: [id number] Patient Account Number: [id number] Date of Birth/Sex: Treating RN: 10-01-1946 (75 y.o. [name] Primary Care  Eustolia Drennen: Yaakov Guthrie Other Clinician: Referring Kashina Mecum: Treating Taylr Meuth/Extender: [name] in Treatment: 0 Fall Risk Assessment Items Have you had 2 or more falls in the last 12 monthso 0 No Have you had any fall that resulted in injury in the last 12 monthso 0 No FALLS RISK SCREEN History of falling - immediate or within 3 months 0 No Secondary diagnosis (Do you have 2 or more medical diagnoseso) 0 No Ambulatory aid None/bed rest/wheelchair/nurse 0 Yes Crutches/cane/walker 0 No Furniture 0 No Intravenous therapy Access/Saline/Heparin Lock 0 No Gait/Transferring Normal/ bed rest/ wheelchair 0 Yes Weak (short steps  with or without shuffle, stooped but able to lift head while walking, may seek 0 No support from furniture) Impaired (short steps with shuffle, may have difficulty arising from chair, head down, impaired 0 No balance) Mental Status Oriented to own ability 0 Yes Electronic Signature(s) Signed: 11/25/2021 5:24:14 PM By: Levan Hurst RN, BSN Entered By: Levan Hurst on 11/25/2021 12:37:51 -------------------------------------------------------------------------------- Foot Assessment Details Patient Name: Date of Service: Caleb Alken RD C. 11/25/2021 12:30 PM Medical Record Number: [id number] Patient Account Number: [id number] Date of Birth/Sex: Treating RN: 1946/11/10 (75 y.o. [name] Primary Care Ghalia Reicks: Yaakov Guthrie Other Clinician: Referring Jamisha Hoeschen: Treating Melvyn Hommes/Extender: [name] in Treatment: 0 Foot Assessment Items Site Locations + = Sensation present, - = Sensation absent, C = Callus, U = Ulcer R = Redness, W = Warmth, M = Maceration, PU = Pre-ulcerative lesion F = Fissure, S = Swelling, D = Dryness Assessment Right: Left: Other Deformity: No No Prior Foot Ulcer: No No Prior Amputation: No No Charcot Joint: No No Ambulatory Status: Ambulatory Without Help Gait:  Steady Electronic Signature(s) Signed: 11/25/2021 5:24:14 PM By: Levan Hurst RN, BSN Entered By: Levan Hurst on 11/25/2021 12:38:20 -------------------------------------------------------------------------------- Nutrition Risk Screening Details Patient Name: Date of Service: Caleb Alken RD C. 11/25/2021 12:30 PM Medical Record Number: [id number] Patient Account Number: [id number] Date of Birth/Sex: Treating RN: [birth year] (75 y.o. [name] Primary Care Annisten Manchester: Yaakov Guthrie Other Clinician: Referring Kirin Brandenburger: Treating Hydia Copelin/Extender: [name] in Treatment: 0 Height (in): Weight (lbs): Body Mass Index (BMI): Nutrition Risk Screening Items Score Screening NUTRITION RISK SCREEN: I have an illness or condition that made me change the kind and/or amount of food I eat 0 No I eat fewer than two meals per day 0 No I eat few fruits and vegetables, or milk products 0 No I have three or more drinks of beer, liquor or wine almost every day 0 No I have tooth or mouth problems that make it hard for me to eat 0 No I don't always have enough money to buy the food I need 0 No I eat alone most of the time 0 No I take three or more different prescribed or over-the-counter drugs a day 1 Yes Without wanting to, I have lost or gained 10 pounds in the last six months 0 No I am not always physically able to shop, cook and/or feed myself 0 No Nutrition Protocols Good Risk Protocol Moderate Risk Protocol 0 Provide education on nutrition High Risk Proctocol Risk Level: Good Risk Score: 1 Electronic Signature(s) Signed: 11/25/2021 5:24:14 PM By: Levan Hurst RN, BSN Entered By: Levan Hurst on 11/25/2021 12:38:02

## 2021-12-01 ENCOUNTER — Ambulatory Visit: Payer: Medicare Other

## 2021-12-01 ENCOUNTER — Ambulatory Visit (INDEPENDENT_AMBULATORY_CARE_PROVIDER_SITE_OTHER): Payer: Medicare Other

## 2021-12-01 ENCOUNTER — Ambulatory Visit (INDEPENDENT_AMBULATORY_CARE_PROVIDER_SITE_OTHER): Payer: Medicare Other | Admitting: Podiatry

## 2021-12-01 ENCOUNTER — Other Ambulatory Visit: Payer: Self-pay

## 2021-12-01 ENCOUNTER — Other Ambulatory Visit: Payer: Self-pay | Admitting: Podiatry

## 2021-12-01 DIAGNOSIS — L97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 with unspecified severity: Secondary | ICD-10-CM | POA: Diagnosis not present

## 2021-12-01 DIAGNOSIS — M21621 Bunionette of right foot: Secondary | ICD-10-CM | POA: Diagnosis not present

## 2021-12-01 DIAGNOSIS — M21961 Unspecified acquired deformity of right lower leg: Secondary | ICD-10-CM | POA: Diagnosis not present

## 2021-12-01 DIAGNOSIS — I739 Peripheral vascular disease, unspecified: Secondary | ICD-10-CM | POA: Diagnosis not present

## 2021-12-01 DIAGNOSIS — L909 Atrophic disorder of skin, unspecified: Secondary | ICD-10-CM

## 2021-12-01 NOTE — Progress Notes (Signed)
°  Subjective:  Patient ID: [name], male    DOB: Feb 19, 1946,  MRN: [id number]  Chief Complaint  Patient presents with   Wound Check    Wound on 5th metatarsal on right foot   75 y.o. male presents with the above complaint. History confirmed with patient. Recently d/ced from wound care center for right foot wound. Has been present for about 2 years, and ulcerates from time to time. Would has been healed and he is applying foam for pressure reduction. Objective:  Physical Exam: warm, good capillary refill, no trophic changes or ulcerative lesions, normal DP and PT pulses, and normal sensory exam. Right Foot: plantarflexed 5th metatarsal with fat pad atrophy. Deep tissue injury like lesion suggesting high pressure without active ulceration   Radiographs: X-ray of the right foot: no fracture, dislocation, swelling or degenerative changes noted and no soft tissue emphysema, no signs of osteomyelitis Assessment:   1. Ulcer of right foot, unspecified ulcer stage ([place])   2. Tailor's bunion of right foot   3. Metatarsal deformity, right   4. PAD (peripheral artery disease) ([place])   5. Fat pad atrophy of foot    Plan:  Patient was evaluated and treated and all questions answered.  Pre-ulcerative  -XR reviewed with patient -Educated on etiology of deformity -Discussed proper shoe gear modifications and padding -Has failed offloading insert's  -Will order new vascular studies to ensure adequate healing post surgery. -Patient has failed all conservative therapy and wishes to proceed with surgical intervention. All risks, benefits, and alternatives discussed with patient. No guarantees given. Consent reviewed and signed by patient. -Planned procedures: right 5th metatarsal head excision -ASA 3 - Patient with moderate systemic disease with functional limitations; Risk factors: PAD, neuropathy -Post-op anticoagulation: coumadin (has history of DVT, on coumadin) -DME dispensed for post-op  use: none  No follow-ups on file.

## 2021-12-03 ENCOUNTER — Ambulatory Visit (HOSPITAL_COMMUNITY)
Admission: RE | Admit: 2021-12-03 | Discharge: 2021-12-03 | Disposition: A | Discharge status: Home / Self Care | Payer: Medicare Other | Source: Ambulatory Visit | Attending: Podiatry | Admitting: Podiatry

## 2021-12-03 ENCOUNTER — Other Ambulatory Visit: Payer: Self-pay

## 2021-12-03 DIAGNOSIS — I739 Peripheral vascular disease, unspecified: Secondary | ICD-10-CM | POA: Diagnosis present

## 2021-12-22 ENCOUNTER — Telehealth: Payer: Self-pay | Admitting: Urology

## 2021-12-22 NOTE — Telephone Encounter (Signed)
DOS - 12/30/21  MET HEAD RESECTION 5TH RIGHT --- [address]  [facility] EFFECTIVE DATE - 12/20/21  PLAN DEDUCTIBLE - $200.00 W/ $200.00 REMAINING OUT OF POCKET - $2,200.00 W/ $2,200.00 REMAINING COINSURANCE - 0% COPAY - $0.00   PER [employer] WEBSITE FOR CPT CODE 54627 Notification or Prior Authorization is not required for the requested services  Decision ID #:[id number]

## 2021-12-29 ENCOUNTER — Other Ambulatory Visit: Payer: Self-pay | Admitting: Podiatry

## 2021-12-29 MED ORDER — CEPHALEXIN 250 MG/5ML PO SUSR: 250.0000 mg | Freq: Two times a day (BID) | ORAL | 0 refills | Status: AC

## 2021-12-29 MED ORDER — HYDROCODONE-ACETAMINOPHEN 7.5-325 MG/15ML PO SOLN: 15.0000 mL | Freq: Four times a day (QID) | ORAL | 0 refills | Status: DC | PRN

## 2021-12-29 NOTE — Progress Notes (Signed)
Called for pre-op call. Answered questions about surgery. Sent rx to pharmacy for patient. Will proceed with surgery tomorrow AM.

## 2021-12-30 DIAGNOSIS — M21961 Unspecified acquired deformity of right lower leg: Secondary | ICD-10-CM

## 2022-01-05 ENCOUNTER — Ambulatory Visit (INDEPENDENT_AMBULATORY_CARE_PROVIDER_SITE_OTHER): Payer: Medicare Other

## 2022-01-05 ENCOUNTER — Ambulatory Visit (INDEPENDENT_AMBULATORY_CARE_PROVIDER_SITE_OTHER): Payer: Medicare Other | Admitting: Podiatry

## 2022-01-05 ENCOUNTER — Other Ambulatory Visit: Payer: Self-pay

## 2022-01-05 ENCOUNTER — Encounter (HOSPITAL_BASED_OUTPATIENT_CLINIC_OR_DEPARTMENT_OTHER): Payer: Medicare Other | Admitting: Internal Medicine

## 2022-01-05 DIAGNOSIS — Z9889 Other specified postprocedural states: Secondary | ICD-10-CM | POA: Diagnosis not present

## 2022-01-05 DIAGNOSIS — M21961 Unspecified acquired deformity of right lower leg: Secondary | ICD-10-CM

## 2022-01-05 DIAGNOSIS — M21621 Bunionette of right foot: Secondary | ICD-10-CM

## 2022-01-05 MED ORDER — HYDROCODONE-ACETAMINOPHEN 7.5-325 MG/15ML PO SOLN: 15.0000 mL | Freq: Four times a day (QID) | ORAL | 0 refills | Status: AC | PRN

## 2022-01-05 NOTE — Progress Notes (Signed)
Subjective:  Patient ID: [name], male    DOB: [birth year],  MRN: [id number]  Chief Complaint  Patient presents with   Routine Post Op    POV #1 DOS 12/30/2021 RT FOOT 5TH METATARSAL HEAD EXCISION   DOS: 12/30/21 Procedure: Removal right 5th head excision   76 y.o. male presents with the above complaint. History confirmed with patient. Doing very well not having any pain, only took pain medication shortly after the procedure. Has been compliant with icing.   Objective:  Physical Exam: no tenderness at the surgical site, local edema noted, and calf supple, nontender. Incision: healing well, no significant drainage, no dehiscence, no significant erythema  No images are attached to the encounter.  Radiographs: X-ray of the right foot: consistent with post-op state, with interval 5th met head excision  Assessment:   1. Status post right foot surgery    Plan:  Patient was evaluated and treated and all questions answered.  Post-operative State -XR reviewed with patient -Dressing applied consisting of sterile gauze, kerlix, and ACE bandage -WBAT in Surgical shoe -Surgical shoe dispensed -XRs needed at follow-up: none  Return in about 2 weeks (around 01/19/2022) for Post-Op (No XRs).

## 2022-01-13 ENCOUNTER — Encounter: Payer: Self-pay | Admitting: Podiatry

## 2022-01-13 ENCOUNTER — Ambulatory Visit (INDEPENDENT_AMBULATORY_CARE_PROVIDER_SITE_OTHER): Payer: Medicare Other | Admitting: Podiatry

## 2022-01-13 ENCOUNTER — Other Ambulatory Visit: Payer: Self-pay

## 2022-01-13 DIAGNOSIS — B351 Tinea unguium: Secondary | ICD-10-CM

## 2022-01-13 DIAGNOSIS — D689 Coagulation defect, unspecified: Secondary | ICD-10-CM | POA: Diagnosis not present

## 2022-01-13 DIAGNOSIS — M79675 Pain in left toe(s): Secondary | ICD-10-CM | POA: Diagnosis not present

## 2022-01-13 DIAGNOSIS — M79674 Pain in right toe(s): Secondary | ICD-10-CM

## 2022-01-13 DIAGNOSIS — Z9889 Other specified postprocedural states: Secondary | ICD-10-CM | POA: Diagnosis not present

## 2022-01-13 NOTE — Progress Notes (Signed)
This patient returns to my office for at risk foot care.  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  The ulcer right foot has resolved.  This patient is unable to cut nails himself since the patient cannot reach his nails.These nails are painful walking and wearing shoes.  This patient presents for at risk foot care today.   Patient presents with a bandage on his right foot following surgery fifth metatarsal right foot.  General Appearance  Alert, conversant and in no acute stress.  Vascular  Dorsalis pedis and posterior tibial  pulses are palpable  bilaterally.  Capillary return is within normal limits  bilaterally. Temperature is within normal limits  bilaterally.  Neurologic  Senn-Weinstein monofilament wire test within normal limits  bilaterally. Muscle power within normal limits bilaterally.  Nails Thick disfigured discolored nails with subungual debris  Hallux nails  bilaterally. No evidence of bacterial infection or drainage bilaterally.  Orthopedic  No limitations of motion  feet .  No crepitus or effusions noted.  No bony pathology or digital deformities noted.    Skin  normotropic skin with no porokeratosis noted bilaterally.  No signs of infections or ulcers noted.     Onychomycosis  Pain in right toes  Pain in left toes  Consent was obtained for treatment procedures.   Mechanical debridement of nails 1-5  bilaterally performed with a nail nipper.  Filed with dremel without incident.    Return office visit    10 weeks                 Told patient to return for periodic foot care and evaluation due to potential at risk complications.   Gardiner Barefoot DPM

## 2022-01-19 ENCOUNTER — Ambulatory Visit (INDEPENDENT_AMBULATORY_CARE_PROVIDER_SITE_OTHER): Payer: Medicare Other | Admitting: Podiatry

## 2022-01-19 ENCOUNTER — Other Ambulatory Visit: Payer: Self-pay

## 2022-01-19 DIAGNOSIS — Z9889 Other specified postprocedural states: Secondary | ICD-10-CM

## 2022-01-19 NOTE — Progress Notes (Signed)
°  Subjective:  Patient ID: [name], male    DOB: [birth year],  MRN: [id number]  Chief Complaint  Patient presents with   Routine Post Op     POV #2 DOS 12/30/2021 RT FOOT 5TH METATARSAL HEAD EXCISION   DOS: 12/30/21 Procedure: Removal right 5th head excision   76 y.o. male presents with the above complaint. History confirmed with patient.  Doing very well denies pain or postop issues.  Objective:  Physical Exam: no tenderness at the surgical site, local edema noted, and calf supple, nontender. Incision: healing well, no significant drainage, no dehiscence, no significant erythema    Assessment:   1. Status post right foot surgery     Plan:  Patient was evaluated and treated and all questions answered.  Post-operative State -Sutures removed -Steri-strips applied to the incision -Ok to start showering at this time. Advised they cannot soak. -WBAT in Surgical shoe for one more week then transition normal shoe gear -XRs needed at follow-up: none  No follow-ups on file.

## 2022-02-12 ENCOUNTER — Ambulatory Visit (INDEPENDENT_AMBULATORY_CARE_PROVIDER_SITE_OTHER): Payer: Medicare Other

## 2022-02-12 ENCOUNTER — Ambulatory Visit (INDEPENDENT_AMBULATORY_CARE_PROVIDER_SITE_OTHER): Payer: Medicare Other | Admitting: Podiatry

## 2022-02-12 ENCOUNTER — Encounter: Payer: Self-pay | Admitting: Podiatry

## 2022-02-12 ENCOUNTER — Other Ambulatory Visit: Payer: Self-pay

## 2022-02-12 DIAGNOSIS — Z9889 Other specified postprocedural states: Secondary | ICD-10-CM

## 2022-02-12 DIAGNOSIS — M21961 Unspecified acquired deformity of right lower leg: Secondary | ICD-10-CM

## 2022-02-12 NOTE — Progress Notes (Signed)
Subjective:   Patient ID: [name], male   DOB: 76 y.o.   MRN: [id number]   HPI Patient presents with caregiver stating doing very well with surgery   ROS      Objective:  Physical Exam  Neuro vascular status intact negative Bevelyn Buckles' sign noted wound edges well well coapted fifth metatarsal and no discomfort on the plantar aspect fifth metatarsal head currently     Assessment:  Doing well post fifth metatarsal head resection right     Plan:  X-ray dated today indicated satisfactory resection of bone good alignment noted no pathology discharge patient currently

## 2022-03-24 ENCOUNTER — Encounter: Payer: Self-pay | Admitting: Podiatry

## 2022-03-24 ENCOUNTER — Ambulatory Visit (INDEPENDENT_AMBULATORY_CARE_PROVIDER_SITE_OTHER): Payer: Medicare Other | Admitting: Podiatry

## 2022-03-24 DIAGNOSIS — M79675 Pain in left toe(s): Secondary | ICD-10-CM

## 2022-03-24 DIAGNOSIS — D689 Coagulation defect, unspecified: Secondary | ICD-10-CM

## 2022-03-24 DIAGNOSIS — B351 Tinea unguium: Secondary | ICD-10-CM

## 2022-03-24 DIAGNOSIS — M79674 Pain in right toe(s): Secondary | ICD-10-CM

## 2022-03-24 DIAGNOSIS — I739 Peripheral vascular disease, unspecified: Secondary | ICD-10-CM

## 2022-03-24 NOTE — Progress Notes (Signed)
This patient returns to my office for at risk foot care.  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  The ulcer right foot has resolved.  This patient is unable to cut nails himself since the patient cannot reach his nails.These nails are painful walking and wearing shoes.  This patient presents for at risk foot care today.    ? ?General Appearance  Alert, conversant and in no acute stress. ? ?Vascular  Dorsalis pedis and posterior tibial  pulses are palpable  bilaterally.  Capillary return is within normal limits  bilaterally. Temperature is within normal limits  bilaterally. ? ?Neurologic  Senn-Weinstein monofilament wire test within normal limits  bilaterally. Muscle power within normal limits bilaterally. ? ?Nails Thick disfigured discolored nails with subungual debris  hallux nails  bilaterally. No evidence of bacterial infection or drainage bilaterally. ? ?Orthopedic  No limitations of motion  feet .  No crepitus or effusions noted.  No bony pathology or digital deformities noted.   ? ?Skin  normotropic skin with no porokeratosis noted bilaterally.  No signs of infections or ulcers noted.    ? ?Onychomycosis  Pain in right toes  Pain in left toes ? ?Consent was obtained for treatment procedures.   Mechanical debridement of nails 1-5  bilaterally performed with a nail nipper.  Filed with dremel without incident.  ? ? ?Return office visit    10 weeks                 Told patient to return for periodic foot care and evaluation due to potential at risk complications. ? ? ?Gardiner Barefoot DPM  ?

## 2022-06-08 ENCOUNTER — Encounter: Payer: Self-pay | Admitting: Podiatry

## 2022-06-08 ENCOUNTER — Ambulatory Visit (INDEPENDENT_AMBULATORY_CARE_PROVIDER_SITE_OTHER): Payer: Medicare Other | Admitting: Podiatry

## 2022-06-08 DIAGNOSIS — B351 Tinea unguium: Secondary | ICD-10-CM | POA: Diagnosis not present

## 2022-06-08 DIAGNOSIS — M79675 Pain in left toe(s): Secondary | ICD-10-CM

## 2022-06-08 DIAGNOSIS — D689 Coagulation defect, unspecified: Secondary | ICD-10-CM

## 2022-06-08 DIAGNOSIS — I739 Peripheral vascular disease, unspecified: Secondary | ICD-10-CM | POA: Diagnosis not present

## 2022-06-08 DIAGNOSIS — M79674 Pain in right toe(s): Secondary | ICD-10-CM

## 2022-06-08 NOTE — Progress Notes (Signed)
This patient returns to my office for at risk foot care.  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  The ulcer right foot has resolved.  This patient is unable to cut nails himself since the patient cannot reach his nails.These nails are painful walking and wearing shoes.  This patient presents for at risk foot care today.     General Appearance  Alert, conversant and in no acute stress.  Vascular  Dorsalis pedis and posterior tibial  pulses are palpable  bilaterally.  Capillary return is within normal limits  bilaterally. Temperature is within normal limits  bilaterally.  Neurologic  Senn-Weinstein monofilament wire test within normal limits  bilaterally. Muscle power within normal limits bilaterally.  Nails Thick disfigured discolored nails with subungual debris  hallux nails  bilaterally. No evidence of bacterial infection or drainage bilaterally.  Orthopedic  No limitations of motion  feet .  No crepitus or effusions noted.  No bony pathology or digital deformities noted.    Skin  normotropic skin with no porokeratosis noted bilaterally.  No signs of infections or ulcers noted.     Onychomycosis  Pain in right toes  Pain in left toes  Consent was obtained for treatment procedures.   Mechanical debridement of nails 1-5  bilaterally performed with a nail nipper.  Filed with dremel without incident.    Return office visit    10 weeks                 Told patient to return for periodic foot care and evaluation due to potential at risk complications.   Gardiner Barefoot DPM

## 2022-08-24 ENCOUNTER — Encounter: Payer: Self-pay | Admitting: Podiatry

## 2022-08-24 ENCOUNTER — Ambulatory Visit (INDEPENDENT_AMBULATORY_CARE_PROVIDER_SITE_OTHER): Payer: Medicare Other | Admitting: Podiatry

## 2022-08-24 DIAGNOSIS — D689 Coagulation defect, unspecified: Secondary | ICD-10-CM

## 2022-08-24 DIAGNOSIS — Z9889 Other specified postprocedural states: Secondary | ICD-10-CM

## 2022-08-24 DIAGNOSIS — B351 Tinea unguium: Secondary | ICD-10-CM | POA: Diagnosis not present

## 2022-08-24 DIAGNOSIS — M79675 Pain in left toe(s): Secondary | ICD-10-CM | POA: Diagnosis not present

## 2022-08-24 DIAGNOSIS — I739 Peripheral vascular disease, unspecified: Secondary | ICD-10-CM

## 2022-08-24 DIAGNOSIS — M79674 Pain in right toe(s): Secondary | ICD-10-CM | POA: Diagnosis not present

## 2022-08-24 NOTE — Progress Notes (Signed)
This patient returns to my office for at risk foot care.  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  The ulcer right foot has resolved.  This patient is unable to cut nails himself since the patient cannot reach his nails.These nails are painful walking and wearing shoes.  This patient presents for at risk foot care today.     General Appearance  Alert, conversant and in no acute stress.  Vascular  Dorsalis pedis and posterior tibial  pulses are palpable  bilaterally.  Capillary return is within normal limits  bilaterally. Temperature is within normal limits  bilaterally.  Neurologic  Senn-Weinstein monofilament wire test within normal limits  bilaterally. Muscle power within normal limits bilaterally.  Nails Thick disfigured discolored nails with subungual debris  hallux nails  bilaterally. No evidence of bacterial infection or drainage bilaterally.  Orthopedic  No limitations of motion  feet .  No crepitus or effusions noted.  No bony pathology or digital deformities noted.    Skin  normotropic skin with no porokeratosis noted bilaterally.  No signs of infections or ulcers noted.     Onychomycosis  Pain in right toes  Pain in left toes  Consent was obtained for treatment procedures.   Mechanical debridement of nails 1-5  bilaterally performed with a nail nipper.  Filed with dremel without incident.    Return office visit    10 weeks                 Told patient to return for periodic foot care and evaluation due to potential at risk complications.   Gardiner Barefoot DPM

## 2022-09-20 ENCOUNTER — Other Ambulatory Visit: Payer: Self-pay

## 2022-09-20 ENCOUNTER — Ambulatory Visit (HOSPITAL_COMMUNITY)
Admission: RE | Admit: 2022-09-20 | Discharge: 2022-09-20 | Disposition: A | Discharge status: Home / Self Care | Payer: Medicare Other | Source: Ambulatory Visit | Attending: Internal Medicine | Admitting: Internal Medicine

## 2022-09-20 ENCOUNTER — Other Ambulatory Visit: Payer: Medicare Other

## 2022-09-20 ENCOUNTER — Inpatient Hospital Stay: Payer: Medicare Other | Attending: Internal Medicine

## 2022-09-20 DIAGNOSIS — Z85118 Personal history of other malignant neoplasm of bronchus and lung: Secondary | ICD-10-CM | POA: Insufficient documentation

## 2022-09-20 DIAGNOSIS — Z902 Acquired absence of lung [part of]: Secondary | ICD-10-CM | POA: Insufficient documentation

## 2022-09-20 DIAGNOSIS — C3490 Malignant neoplasm of unspecified part of unspecified bronchus or lung: Secondary | ICD-10-CM | POA: Insufficient documentation

## 2022-09-20 DIAGNOSIS — Z79899 Other long term (current) drug therapy: Secondary | ICD-10-CM | POA: Insufficient documentation

## 2022-09-20 LAB — CMP (CANCER CENTER ONLY)
ALT: 25 U/L (ref 0–44)
AST: 21 U/L (ref 15–41)
Albumin: 3.9 g/dL (ref 3.5–5.0)
Alkaline Phosphatase: 87 U/L (ref 38–126)
Anion gap: 4 — ABNORMAL LOW (ref 5–15)
BUN: 7 mg/dL — ABNORMAL LOW (ref 8–23)
CO2: 30 mmol/L (ref 22–32)
Calcium: 9 mg/dL (ref 8.9–10.3)
Chloride: 103 mmol/L (ref 98–111)
Creatinine: 0.62 mg/dL (ref 0.61–1.24)
GFR, Estimated: >60 mL/min (ref >60)
Glucose, Bld: 93 mg/dL (ref 70–99)
Potassium: 4.2 mmol/L (ref 3.5–5.1)
Sodium: 137 mmol/L (ref 135–145)
Total Bilirubin: 0.4 mg/dL (ref 0.3–1.2)
Total Protein: 6.2 g/dL — ABNORMAL LOW (ref 6.5–8.1)

## 2022-09-20 LAB — CBC WITH DIFFERENTIAL (CANCER CENTER ONLY)
Abs Immature Granulocytes: 0.01 10*3/uL (ref 0.00–0.07)
Basophils Absolute: 0 10*3/uL (ref 0.0–0.1)
Basophils Relative: 0 %
Eosinophils Absolute: 0.1 10*3/uL (ref 0.0–0.5)
Eosinophils Relative: 2 %
HCT: 39.2 % (ref 39.0–52.0)
Hemoglobin: 13.9 g/dL (ref 13.0–17.0)
Immature Granulocytes: 0 %
Lymphocytes Relative: 29 %
Lymphs Abs: 1.4 10*3/uL (ref 0.7–4.0)
MCH: 37.1 pg — ABNORMAL HIGH (ref 26.0–34.0)
MCHC: 35.5 g/dL (ref 30.0–36.0)
MCV: 104.5 fL — ABNORMAL HIGH (ref 80.0–100.0)
Monocytes Absolute: 0.6 10*3/uL (ref 0.1–1.0)
Monocytes Relative: 12 %
Neutro Abs: 2.7 10*3/uL (ref 1.7–7.7)
Neutrophils Relative %: 57 %
Platelet Count: 158 10*3/uL (ref 150–400)
RBC: 3.75 MIL/uL — ABNORMAL LOW (ref 4.22–5.81)
RDW: 12.7 % (ref 11.5–15.5)
WBC Count: 4.7 10*3/uL (ref 4.0–10.5)
nRBC: 0 % (ref 0.0–0.2)

## 2022-09-20 MED ORDER — IOHEXOL 300 MG/ML  SOLN
75.0000 mL | Freq: Once | INTRAMUSCULAR | Status: AC | PRN
  Administered 2022-09-20: 75 mL via INTRAVENOUS

## 2022-09-22 ENCOUNTER — Inpatient Hospital Stay (HOSPITAL_BASED_OUTPATIENT_CLINIC_OR_DEPARTMENT_OTHER): Payer: Medicare Other | Admitting: Internal Medicine

## 2022-09-22 ENCOUNTER — Other Ambulatory Visit: Payer: Self-pay

## 2022-09-22 VITALS — BP 164/83 | HR 76 | Temp 98.4°F | Resp 17 | Wt 203.6 lb

## 2022-09-22 DIAGNOSIS — Z85118 Personal history of other malignant neoplasm of bronchus and lung: Secondary | ICD-10-CM | POA: Diagnosis present

## 2022-09-22 DIAGNOSIS — Z79899 Other long term (current) drug therapy: Secondary | ICD-10-CM | POA: Diagnosis not present

## 2022-09-22 DIAGNOSIS — Z902 Acquired absence of lung [part of]: Secondary | ICD-10-CM | POA: Diagnosis not present

## 2022-09-22 DIAGNOSIS — C3490 Malignant neoplasm of unspecified part of unspecified bronchus or lung: Secondary | ICD-10-CM | POA: Diagnosis not present

## 2022-09-22 NOTE — Progress Notes (Signed)
[facility] [phone]   Fax:([phone]  OFFICE PROGRESS NOTE  Vernie Shanks, MD (Inactive) No address on file  DIAGNOSIS: Stage IA (T1a, N0, M0) non-small cell lung cancer, adenocarcinoma diagnosed in January 2018  PRIOR THERAPY: Status post right lower lobe superior segmentectomy with lymph node dissection under the care of Dr. Roxan Hockey.  CURRENT THERAPY: Observation.  INTERVAL HISTORY: [name] 76 y.o. male returns to the clinic today for annual follow-up visit accompanied by his wife.  The patient is feeling fine today with no concerning complaints.  He denied having any chest pain, shortness of breath, cough or hemoptysis.  He denied having any fever or chills.  He has no nausea, vomiting, diarrhea or constipation.  He has no headache or visual changes.  He denied having any recent weight loss or night sweats.  He had repeat CT scan of the chest performed recently and he is here for evaluation and discussion of his scan results.  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  MEDICATIONS:  Current Outpatient Medications  Medication Sig Dispense Refill   albuterol (VENTOLIN HFA) 108 (90 Base) MCG/ACT inhaler 1-2 puffs as needed     Bacillus Coagulans-Inulin (PROBIOTIC) 1-250 BILLION-MG CAPS Take 1 capsule by mouth daily.     Brimonidine Tartrate (LUMIFY) 0.025 % SOLN 1 drop into affected eye  as needed     carvedilol (COREG) 3.125 MG tablet Take 3.125 mg by mouth 2 (two)  times daily with a meal.     ELIQUIS 2.5 MG TABS tablet Take 2.5 mg by mouth 2 (two) times daily.     HYDROcodone-acetaminophen (HYCET) 7.5-325 mg/15 ml solution Take 15 mLs by mouth 4 (four) times daily as needed for moderate pain. 120 mL 0   latanoprost (XALATAN) 0.005 % ophthalmic solution Place 1 drop into both eyes at bedtime.     loperamide (IMODIUM A-D) 2 MG tablet 1 tablet as needed     losartan (COZAAR) 50 MG tablet Take 1 tablet by mouth daily.     losartan-hydrochlorothiazide (HYZAAR) 100-12.5 MG tablet SMARTSIG:1 By Mouth     Multiple Vitamin (MULTIVITAMIN WITH MINERALS) TABS tablet Take 1 tablet by mouth daily.     rosuvastatin (CRESTOR) 5 MG tablet Take 5 mg by mouth daily.     SPIRIVA HANDIHALER 18 MCG inhalation capsule 1 capsule daily.     No current facility-administered medications for this visit.    SURGICAL HISTORY:  Past Surgical History:  Procedure Laterality Date   HERNIA REPAIR     left ingunal   LUNG REMOVAL, PARTIAL Left    SEGMENTECOMY Right 01/12/2017   Procedure: RIGHT SUPERIOR SEGMENTECTOMY;  Surgeon: Melrose Nakayama, MD;  Location: [facility];  Service: Thoracic;  Laterality: Right;   SUBCLAVIAN STENT PLACEMENT     TONSILLECTOMY     VIDEO ASSISTED THORACOSCOPY Right 01/12/2017   Procedure: VIDEO ASSISTED  THORACOSCOPY;  Surgeon: Melrose Nakayama, MD;  Location: [facility];  Service: Thoracic;  Laterality: Right;    REVIEW OF SYSTEMS:  A comprehensive review of systems was negative.   PHYSICAL EXAMINATION: General appearance: alert, cooperative, and no distress Head: Normocephalic, without obvious abnormality, atraumatic Neck: no adenopathy, no JVD, supple, symmetrical, trachea midline, and thyroid not enlarged, symmetric, no tenderness/mass/nodules Lymph nodes: Cervical, supraclavicular, and axillary nodes normal. Resp: clear to auscultation bilaterally Back: symmetric, no curvature. ROM normal. No CVA tenderness. Cardio: regular rate and rhythm, S1, S2  normal, no murmur, click, rub or gallop GI: soft, non-tender; bowel sounds normal; no masses,  no organomegaly Extremities: extremities normal, atraumatic, no cyanosis or edema  ECOG PERFORMANCE STATUS: 1 - Symptomatic but completely ambulatory  Blood pressure (!) 164/83, pulse 76, temperature 98.4 F (36.9 C), temperature source Oral, resp. rate 17, weight 203 lb 9 oz (92.3 kg), SpO2 95 %.  LABORATORY DATA: Lab Results  Component Value Date   WBC 4.7 09/20/2022   HGB 13.9 09/20/2022   HCT 39.2 09/20/2022   MCV 104.5 (H) 09/20/2022   PLT 158 09/20/2022      Chemistry      Component Value Date/Time   NA 137 09/20/2022 1524   NA 138 09/07/2017 1051   K 4.2 09/20/2022 1524   K 4.7 09/07/2017 1051   CL 103 09/20/2022 1524   CO2 30 09/20/2022 1524   CO2 28 09/07/2017 1051   BUN 7 (L) 09/20/2022 1524   BUN 8.6 09/07/2017 1051   CREATININE 0.62 09/20/2022 1524   CREATININE 0.8 09/07/2017 1051      Component Value Date/Time   CALCIUM 9.0 09/20/2022 1524   CALCIUM 9.5 09/07/2017 1051   ALKPHOS 87 09/20/2022 1524   ALKPHOS 107 09/07/2017 1051   AST 21 09/20/2022 1524   AST 42 (H) 09/07/2017 1051   ALT 25 09/20/2022 1524   ALT 36 09/07/2017 1051   BILITOT 0.4 09/20/2022 1524   BILITOT 0.54 09/07/2017 1051       RADIOGRAPHIC STUDIES: CT Chest W Contrast  Result Date: 09/22/2022 CLINICAL DATA:  76 year old male with history of non-small cell lung cancer. Follow-up study. * Tracking Code: BO * EXAM: CT CHEST WITH CONTRAST TECHNIQUE: Multidetector CT imaging of the chest was performed during intravenous contrast administration. RADIATION DOSE REDUCTION: This exam was performed according to the departmental dose-optimization program which includes automated exposure control, adjustment of the mA and/or kV according to patient size and/or use of iterative reconstruction technique. CONTRAST:  48mL OMNIPAQUE IOHEXOL 300 MG/ML  SOLN COMPARISON:  Chest CT 09/22/2021. FINDINGS:  Cardiovascular: Heart size is normal. There is no significant pericardial fluid, thickening or pericardial calcification. There is aortic atherosclerosis, as well as atherosclerosis of the great vessels of the mediastinum and the coronary arteries, including calcified atherosclerotic plaque in the left main, left anterior descending, left circumflex and right coronary arteries. Mediastinum/Nodes: No pathologically enlarged mediastinal or hilar lymph nodes. Esophagus is unremarkable in appearance. No axillary lymphadenopathy. Lungs/Pleura: Status post left upper lobectomy. Compensatory hyperexpansion of the left lower lobe. Large cystic lesion in the perihilar aspect of the left lower lobe, stable compared to the prior examination, without definite focal soft tissue component. 3 mm right middle lobe pulmonary nodule (axial image 81 of series 5), stable, considered benign. No definite suspicious appearing pulmonary nodules or masses are otherwise noted. No acute consolidative airspace disease. No pleural effusions. Diffuse bronchial wall thickening with moderate centrilobular and paraseptal emphysema. Upper Abdomen: Aortic atherosclerosis.  Musculoskeletal: There are no aggressive appearing lytic or blastic lesions noted in the visualized portions of the skeleton. IMPRESSION: 1. Status post left upper lobectomy with no findings to suggest locally recurrent disease or metastatic disease. 2. Diffuse bronchial wall thickening with moderate centrilobular and paraseptal emphysema; imaging findings suggestive of underlying COPD. 3. Aortic atherosclerosis, in addition to left main and three-vessel coronary artery disease. Please note that although the presence of coronary artery calcium documents the presence of coronary artery disease, the severity of this disease and any potential stenosis cannot be assessed on this non-gated CT examination. Assessment for potential risk factor modification, dietary therapy or pharmacologic  therapy may be warranted, if clinically indicated. Aortic Atherosclerosis (ICD10-I70.0) and Emphysema (ICD10-J43.9). Electronically Signed   By: Vinnie Langton M.D.   On: 09/22/2022 09:26     ASSESSMENT AND PLAN: This is a very pleasant 76 years old white male with stage IA non-small cell lung cancer, adenocarcinoma status post right lower lobe superior segmentectomy with lymph node dissection in January 2018. The patient is currently on observation and he is feeling fine with no concerning complaints. He had repeat CT scan of the chest performed recently.  I personally and independently reviewed the scan and discussed the result with the patient and his wife. His scan showed no concerning findings for disease recurrence or metastasis.  He has underlying COPD and atherosclerosis and I advised him to reach out to his primary care physician and cardiology for further evaluation. I will see the patient back for follow-up visit in 1 year for evaluation and repeat CT scan of the chest. He was also given the option of follow-up with his primary care provider but the patient would like to come back in 1 year with the imaging studies. He was advised to call immediately if he has any other concerning symptoms in the interval. The patient voices understanding of current disease status and treatment options and is in agreement with the current care plan.  All questions were answered. The patient knows to call the clinic with any problems, questions or concerns. We can certainly see the patient much sooner if necessary.  Disclaimer: This note was dictated with voice recognition software. Similar sounding words can inadvertently be transcribed and may not be corrected upon review.

## 2022-11-03 ENCOUNTER — Encounter: Payer: Self-pay | Admitting: Podiatry

## 2022-11-03 ENCOUNTER — Ambulatory Visit (INDEPENDENT_AMBULATORY_CARE_PROVIDER_SITE_OTHER): Payer: Medicare Other | Admitting: Podiatry

## 2022-11-03 DIAGNOSIS — M79674 Pain in right toe(s): Secondary | ICD-10-CM

## 2022-11-03 DIAGNOSIS — B351 Tinea unguium: Secondary | ICD-10-CM | POA: Diagnosis not present

## 2022-11-03 DIAGNOSIS — M79675 Pain in left toe(s): Secondary | ICD-10-CM | POA: Diagnosis not present

## 2022-11-03 DIAGNOSIS — D689 Coagulation defect, unspecified: Secondary | ICD-10-CM

## 2022-11-03 DIAGNOSIS — I739 Peripheral vascular disease, unspecified: Secondary | ICD-10-CM | POA: Diagnosis not present

## 2022-11-03 NOTE — Progress Notes (Signed)
This patient returns to my office for at risk foot care.  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  The ulcer right foot has resolved.  This patient is unable to cut nails himself since the patient cannot reach his nails.These nails are painful walking and wearing shoes.  This patient presents for at risk foot care today.     General Appearance  Alert, conversant and in no acute stress.  Vascular  Dorsalis pedis and posterior tibial  pulses are palpable  bilaterally.  Capillary return is within normal limits  bilaterally. Temperature is within normal limits  bilaterally.  Neurologic  Senn-Weinstein monofilament wire test within normal limits  bilaterally. Muscle power within normal limits bilaterally.  Nails Thick disfigured discolored nails with subungual debris  hallux nails  bilaterally. No evidence of bacterial infection or drainage bilaterally.  Orthopedic  No limitations of motion  feet .  No crepitus or effusions noted.  No bony pathology or digital deformities noted.    Skin  normotropic skin with no porokeratosis noted bilaterally.  No signs of infections or ulcers noted.     Onychomycosis  Pain in right toes  Pain in left toes  Consent was obtained for treatment procedures.   Mechanical debridement of nails 1-5  bilaterally performed with a nail nipper.  Filed with dremel without incident.    Return office visit    10 weeks                 Told patient to return for periodic foot care and evaluation due to potential at risk complications.   Gardiner Barefoot DPM

## 2023-01-12 ENCOUNTER — Encounter: Payer: Self-pay | Admitting: Podiatry

## 2023-01-12 ENCOUNTER — Ambulatory Visit (INDEPENDENT_AMBULATORY_CARE_PROVIDER_SITE_OTHER): Payer: Medicare Other | Admitting: Podiatry

## 2023-01-12 DIAGNOSIS — M79675 Pain in left toe(s): Secondary | ICD-10-CM

## 2023-01-12 DIAGNOSIS — M79674 Pain in right toe(s): Secondary | ICD-10-CM | POA: Diagnosis not present

## 2023-01-12 DIAGNOSIS — B351 Tinea unguium: Secondary | ICD-10-CM

## 2023-01-12 DIAGNOSIS — D689 Coagulation defect, unspecified: Secondary | ICD-10-CM

## 2023-01-12 NOTE — Progress Notes (Signed)
This patient returns to my office for at risk foot care.  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  The ulcer right foot has resolved.  This patient is unable to cut nails himself since the patient cannot reach his nails.These nails are painful walking and wearing shoes.  This patient presents for at risk foot care today.     General Appearance  Alert, conversant and in no acute stress.  Vascular  Dorsalis pedis and posterior tibial  pulses are palpable  bilaterally.  Capillary return is within normal limits  bilaterally. Temperature is within normal limits  bilaterally.  Neurologic  Senn-Weinstein monofilament wire test within normal limits  bilaterally. Muscle power within normal limits bilaterally.  Nails Thick disfigured discolored nails with subungual debris  hallux nails  bilaterally. No evidence of bacterial infection or drainage bilaterally.  Orthopedic  No limitations of motion  feet .  No crepitus or effusions noted.  No bony pathology or digital deformities noted.    Skin  normotropic skin with no porokeratosis noted bilaterally.  No signs of infections or ulcers noted.     Onychomycosis  Pain in right toes  Pain in left toes  Consent was obtained for treatment procedures.   Mechanical debridement of nails 1-5  bilaterally performed with a nail nipper.  Filed with dremel without incident.    Return office visit    10 weeks                 Told patient to return for periodic foot care and evaluation due to potential at risk complications.   Gardiner Barefoot DPM

## 2023-02-02 ENCOUNTER — Inpatient Hospital Stay (HOSPITAL_BASED_OUTPATIENT_CLINIC_OR_DEPARTMENT_OTHER)
Admission: EM | Admit: 2023-02-02 | Discharge: 2023-02-08 | DRG: 253 | Disposition: A | Discharge status: Other Acute Inpatient Hospital | Payer: Medicare Other | Attending: Internal Medicine | Admitting: Internal Medicine

## 2023-02-02 ENCOUNTER — Inpatient Hospital Stay (HOSPITAL_COMMUNITY): Payer: Medicare Other

## 2023-02-02 ENCOUNTER — Emergency Department (HOSPITAL_BASED_OUTPATIENT_CLINIC_OR_DEPARTMENT_OTHER): Payer: Medicare Other

## 2023-02-02 ENCOUNTER — Other Ambulatory Visit: Payer: Self-pay

## 2023-02-02 ENCOUNTER — Emergency Department (HOSPITAL_BASED_OUTPATIENT_CLINIC_OR_DEPARTMENT_OTHER): Payer: Medicare Other | Admitting: Radiology

## 2023-02-02 ENCOUNTER — Encounter (HOSPITAL_BASED_OUTPATIENT_CLINIC_OR_DEPARTMENT_OTHER): Payer: Self-pay

## 2023-02-02 DIAGNOSIS — Z7901 Long term (current) use of anticoagulants: Secondary | ICD-10-CM

## 2023-02-02 DIAGNOSIS — Z902 Acquired absence of lung [part of]: Secondary | ICD-10-CM | POA: Diagnosis not present

## 2023-02-02 DIAGNOSIS — Z86718 Personal history of other venous thrombosis and embolism: Secondary | ICD-10-CM | POA: Diagnosis not present

## 2023-02-02 DIAGNOSIS — I82B12 Acute embolism and thrombosis of left subclavian vein: Secondary | ICD-10-CM | POA: Diagnosis not present

## 2023-02-02 DIAGNOSIS — D61818 Other pancytopenia: Principal | ICD-10-CM | POA: Diagnosis present

## 2023-02-02 DIAGNOSIS — E669 Obesity, unspecified: Secondary | ICD-10-CM | POA: Diagnosis present

## 2023-02-02 DIAGNOSIS — Z87891 Personal history of nicotine dependence: Secondary | ICD-10-CM

## 2023-02-02 DIAGNOSIS — I82B19 Acute embolism and thrombosis of unspecified subclavian vein: Secondary | ICD-10-CM | POA: Diagnosis present

## 2023-02-02 DIAGNOSIS — Z6830 Body mass index (BMI) 30.0-30.9, adult: Secondary | ICD-10-CM

## 2023-02-02 DIAGNOSIS — F1020 Alcohol dependence, uncomplicated: Secondary | ICD-10-CM | POA: Diagnosis present

## 2023-02-02 DIAGNOSIS — Z91199 Patient's noncompliance with other medical treatment and regimen due to unspecified reason: Secondary | ICD-10-CM | POA: Diagnosis not present

## 2023-02-02 DIAGNOSIS — Z79899 Other long term (current) drug therapy: Secondary | ICD-10-CM

## 2023-02-02 DIAGNOSIS — I10 Essential (primary) hypertension: Secondary | ICD-10-CM | POA: Diagnosis present

## 2023-02-02 DIAGNOSIS — Z85819 Personal history of malignant neoplasm of unspecified site of lip, oral cavity, and pharynx: Secondary | ICD-10-CM

## 2023-02-02 DIAGNOSIS — Z923 Personal history of irradiation: Secondary | ICD-10-CM

## 2023-02-02 DIAGNOSIS — E785 Hyperlipidemia, unspecified: Secondary | ICD-10-CM | POA: Diagnosis present

## 2023-02-02 DIAGNOSIS — Z95828 Presence of other vascular implants and grafts: Secondary | ICD-10-CM

## 2023-02-02 DIAGNOSIS — Z85118 Personal history of other malignant neoplasm of bronchus and lung: Secondary | ICD-10-CM

## 2023-02-02 DIAGNOSIS — J449 Chronic obstructive pulmonary disease, unspecified: Secondary | ICD-10-CM | POA: Diagnosis not present

## 2023-02-02 DIAGNOSIS — K7030 Alcoholic cirrhosis of liver without ascites: Secondary | ICD-10-CM | POA: Diagnosis present

## 2023-02-02 DIAGNOSIS — T82818A Embolism of vascular prosthetic devices, implants and grafts, initial encounter: Principal | ICD-10-CM | POA: Diagnosis present

## 2023-02-02 DIAGNOSIS — Y831 Surgical operation with implant of artificial internal device as the cause of abnormal reaction of the patient, or of later complication, without mention of misadventure at the time of the procedure: Secondary | ICD-10-CM | POA: Diagnosis present

## 2023-02-02 DIAGNOSIS — C9200 Acute myeloblastic leukemia, not having achieved remission: Secondary | ICD-10-CM | POA: Diagnosis present

## 2023-02-02 DIAGNOSIS — F1010 Alcohol abuse, uncomplicated: Secondary | ICD-10-CM

## 2023-02-02 DIAGNOSIS — F1090 Alcohol use, unspecified, uncomplicated: Secondary | ICD-10-CM

## 2023-02-02 DIAGNOSIS — Z86711 Personal history of pulmonary embolism: Secondary | ICD-10-CM | POA: Diagnosis not present

## 2023-02-02 DIAGNOSIS — H409 Unspecified glaucoma: Secondary | ICD-10-CM | POA: Diagnosis present

## 2023-02-02 DIAGNOSIS — G4733 Obstructive sleep apnea (adult) (pediatric): Secondary | ICD-10-CM | POA: Diagnosis present

## 2023-02-02 DIAGNOSIS — I871 Compression of vein: Secondary | ICD-10-CM | POA: Diagnosis not present

## 2023-02-02 DIAGNOSIS — Z83438 Family history of other disorder of lipoprotein metabolism and other lipidemia: Secondary | ICD-10-CM

## 2023-02-02 DIAGNOSIS — Z85828 Personal history of other malignant neoplasm of skin: Secondary | ICD-10-CM

## 2023-02-02 DIAGNOSIS — C3430 Malignant neoplasm of lower lobe, unspecified bronchus or lung: Secondary | ICD-10-CM

## 2023-02-02 DIAGNOSIS — Z8249 Family history of ischemic heart disease and other diseases of the circulatory system: Secondary | ICD-10-CM

## 2023-02-02 DIAGNOSIS — Z800 Family history of malignant neoplasm of digestive organs: Secondary | ICD-10-CM

## 2023-02-02 DIAGNOSIS — E639 Nutritional deficiency, unspecified: Secondary | ICD-10-CM | POA: Diagnosis present

## 2023-02-02 LAB — BASIC METABOLIC PANEL
Anion gap: 10 (ref 5–15)
BUN: 8 mg/dL (ref 8–23)
CO2: 23 mmol/L (ref 22–32)
Calcium: 9.5 mg/dL (ref 8.9–10.3)
Chloride: 103 mmol/L (ref 98–111)
Creatinine, Ser: 0.57 mg/dL — ABNORMAL LOW (ref 0.61–1.24)
GFR, Estimated: >60 mL/min (ref >60)
Glucose, Bld: 101 mg/dL — ABNORMAL HIGH (ref 70–99)
Potassium: 4.4 mmol/L (ref 3.5–5.1)
Sodium: 136 mmol/L (ref 135–145)

## 2023-02-02 LAB — CBC WITH DIFFERENTIAL/PLATELET
Abs Immature Granulocytes: 0 10*3/uL (ref 0.00–0.07)
Basophils Absolute: 0 10*3/uL (ref 0.0–0.1)
Basophils Relative: 3 %
Eosinophils Absolute: 0 10*3/uL (ref 0.0–0.5)
Eosinophils Relative: 3 %
HCT: 30 % — ABNORMAL LOW (ref 39.0–52.0)
Hemoglobin: 10.6 g/dL — ABNORMAL LOW (ref 13.0–17.0)
Lymphocytes Relative: 66 %
Lymphs Abs: 0.9 10*3/uL (ref 0.7–4.0)
MCH: 39.4 pg — ABNORMAL HIGH (ref 26.0–34.0)
MCHC: 35.3 g/dL (ref 30.0–36.0)
MCV: 111.5 fL — ABNORMAL HIGH (ref 80.0–100.0)
Monocytes Absolute: 0 10*3/uL — ABNORMAL LOW (ref 0.1–1.0)
Monocytes Relative: 3 %
Neutro Abs: 0.3 10*3/uL — CL (ref 1.7–7.7)
Neutrophils Relative %: 20 %
Other: 5 %
Platelets: 42 10*3/uL — ABNORMAL LOW (ref 150–400)
RBC: 2.69 MIL/uL — ABNORMAL LOW (ref 4.22–5.81)
RDW: 15.8 % — ABNORMAL HIGH (ref 11.5–15.5)
WBC: 1.4 10*3/uL — CL (ref 4.0–10.5)
nRBC: 0 % (ref 0.0–0.2)

## 2023-02-02 LAB — HEPATIC FUNCTION PANEL
ALT: 14 U/L (ref 0–44)
AST: 16 U/L (ref 15–41)
Albumin: 4.1 g/dL (ref 3.5–5.0)
Alkaline Phosphatase: 69 U/L (ref 38–126)
Bilirubin, Direct: 0.1 mg/dL (ref 0.0–0.2)
Indirect Bilirubin: 0.5 mg/dL (ref 0.3–0.9)
Total Bilirubin: 0.6 mg/dL (ref 0.3–1.2)
Total Protein: 6.7 g/dL (ref 6.5–8.1)

## 2023-02-02 LAB — RETICULOCYTES
Immature Retic Fract: 12.1 % (ref 2.3–15.9)
RBC.: 2.69 MIL/uL — ABNORMAL LOW (ref 4.22–5.81)
Retic Count, Absolute: 39.5 10*3/uL (ref 19.0–186.0)
Retic Ct Pct: 1.5 % (ref 0.4–3.1)

## 2023-02-02 LAB — CBC
HCT: 30.2 % — ABNORMAL LOW (ref 39.0–52.0)
Hemoglobin: 10.8 g/dL — ABNORMAL LOW (ref 13.0–17.0)
MCH: 39.4 pg — ABNORMAL HIGH (ref 26.0–34.0)
MCHC: 35.8 g/dL (ref 30.0–36.0)
MCV: 110.2 fL — ABNORMAL HIGH (ref 80.0–100.0)
Platelets: 41 10*3/uL — ABNORMAL LOW (ref 150–400)
RBC: 2.74 MIL/uL — ABNORMAL LOW (ref 4.22–5.81)
RDW: 15.7 % — ABNORMAL HIGH (ref 11.5–15.5)
WBC: 1.5 10*3/uL — ABNORMAL LOW (ref 4.0–10.5)
nRBC: 0 % (ref 0.0–0.2)

## 2023-02-02 LAB — PHOSPHORUS: Phosphorus: 3.3 mg/dL (ref 2.5–4.6)

## 2023-02-02 LAB — PROTIME-INR
INR: 1 (ref 0.8–1.2)
Prothrombin Time: 13.4 seconds (ref 11.4–15.2)

## 2023-02-02 LAB — MAGNESIUM: Magnesium: 2.1 mg/dL (ref 1.7–2.4)

## 2023-02-02 LAB — BRAIN NATRIURETIC PEPTIDE: B Natriuretic Peptide: 16.4 pg/mL (ref 0.0–100.0)

## 2023-02-02 LAB — APTT: aPTT: 52 seconds — ABNORMAL HIGH (ref 24–36)

## 2023-02-02 LAB — CK: Total CK: 54 U/L (ref 49–397)

## 2023-02-02 MED ORDER — LORAZEPAM 2 MG/ML IJ SOLN: 1.0000 mg | INTRAMUSCULAR | Status: AC | PRN

## 2023-02-02 MED ORDER — ACETAMINOPHEN 650 MG RE SUPP: 650.0000 mg | Freq: Four times a day (QID) | RECTAL | Status: DC | PRN

## 2023-02-02 MED ORDER — IOHEXOL 350 MG/ML SOLN
75.0000 mL | Freq: Once | INTRAVENOUS | Status: AC | PRN
  Administered 2023-02-02: 75 mL via INTRAVENOUS

## 2023-02-02 MED ORDER — SODIUM CHLORIDE (PF) 0.9 % IJ SOLN
INTRAMUSCULAR | Status: AC
  Filled 2023-02-02: qty 50

## 2023-02-02 MED ORDER — CARVEDILOL 3.125 MG PO TABS
3.1250 mg | ORAL_TABLET | Freq: Two times a day (BID) | ORAL | Status: DC
  Administered 2023-02-02 – 2023-02-06 (×9): 3.125 mg via ORAL
  Filled 2023-02-02 (×9): qty 1

## 2023-02-02 MED ORDER — ACETAMINOPHEN 325 MG PO TABS
650.0000 mg | ORAL_TABLET | Freq: Four times a day (QID) | ORAL | Status: DC | PRN
  Filled 2023-02-02: qty 2

## 2023-02-02 MED ORDER — HEPARIN (PORCINE) 25000 UT/250ML-% IV SOLN
1700.0000 [IU]/h | INTRAVENOUS | Status: DC
  Administered 2023-02-02: 1400 [IU]/h via INTRAVENOUS
  Administered 2023-02-04 – 2023-02-07 (×6): 1600 [IU]/h via INTRAVENOUS
  Administered 2023-02-08: 1700 [IU]/h via INTRAVENOUS
  Filled 2023-02-02 (×9): qty 250

## 2023-02-02 MED ORDER — LORAZEPAM 1 MG PO TABS: 1.0000 mg | ORAL_TABLET | ORAL | Status: AC | PRN

## 2023-02-02 MED ORDER — ALBUTEROL SULFATE (2.5 MG/3ML) 0.083% IN NEBU: 2.5000 mg | INHALATION_SOLUTION | RESPIRATORY_TRACT | Status: DC | PRN

## 2023-02-02 MED ORDER — THIAMINE MONONITRATE 100 MG PO TABS
100.0000 mg | ORAL_TABLET | Freq: Every day | ORAL | Status: DC
  Administered 2023-02-03 – 2023-02-06 (×4): 100 mg via ORAL
  Filled 2023-02-02 (×6): qty 1

## 2023-02-02 MED ORDER — LATANOPROST 0.005 % OP SOLN
1.0000 [drp] | Freq: Every day | OPHTHALMIC | Status: DC
  Administered 2023-02-03 – 2023-02-07 (×6): 1 [drp] via OPHTHALMIC
  Filled 2023-02-02 (×2): qty 2.5

## 2023-02-02 MED ORDER — HYDROCODONE-ACETAMINOPHEN 5-325 MG PO TABS: 1.0000 | ORAL_TABLET | ORAL | Status: DC | PRN

## 2023-02-02 MED ORDER — ADULT MULTIVITAMIN W/MINERALS CH
1.0000 | ORAL_TABLET | Freq: Every day | ORAL | Status: DC
  Administered 2023-02-02 – 2023-02-06 (×5): 1 via ORAL
  Filled 2023-02-02 (×6): qty 1

## 2023-02-02 MED ORDER — THIAMINE HCL 100 MG/ML IJ SOLN
100.0000 mg | Freq: Every day | INTRAMUSCULAR | Status: DC
  Administered 2023-02-02: 100 mg via INTRAVENOUS
  Filled 2023-02-02: qty 2

## 2023-02-02 MED ORDER — ROSUVASTATIN CALCIUM 5 MG PO TABS
5.0000 mg | ORAL_TABLET | Freq: Every day | ORAL | Status: DC
  Administered 2023-02-03: 5 mg via ORAL
  Filled 2023-02-02: qty 1

## 2023-02-02 MED ORDER — SODIUM CHLORIDE 0.9 % IV SOLN: INTRAVENOUS | Status: AC

## 2023-02-02 MED ORDER — ALBUTEROL SULFATE HFA 108 (90 BASE) MCG/ACT IN AERS: 2.0000 | INHALATION_SPRAY | RESPIRATORY_TRACT | Status: DC | PRN

## 2023-02-02 MED ORDER — FOLIC ACID 1 MG PO TABS
1.0000 mg | ORAL_TABLET | Freq: Every day | ORAL | Status: DC
  Administered 2023-02-02 – 2023-02-06 (×5): 1 mg via ORAL
  Filled 2023-02-02 (×6): qty 1

## 2023-02-02 NOTE — Assessment & Plan Note (Signed)
Chronic stable continue home medications ?

## 2023-02-02 NOTE — ED Notes (Signed)
[name] at [facility] will send transport to [id number] after shift change.-ABB(NS)

## 2023-02-02 NOTE — H&P (Signed)
[name] GYI:[id number] DOB: 1946-05-05 DOA: 02/02/2023     PCP: Jodene Nam, MD   Outpatient Specialists:   CARDS:   Dr. Radford Pax, Dr. Harrell Gave    Oncology   Dr. Julien Nordmann    Patient arrived to ER on 02/02/23 at 1100 Referred by Attending Guilford Shi, MD   Patient coming from:    home Lives  With family    Chief Complaint:   Chief Complaint  Patient presents with   Arm Swelling    HPI: [name] is a 77 y.o. male with medical history significant of history of lung cancer status post radiation with known history of prior subclavian thrombosis status post stenting x 2 in 2007, COPD, HLD, HTN, ischemic colitis, sleep apnea    Presented with left arm swelling Presents with left eye swelling for the past 3 to 4 days shortness of breath started to get worse for past few months. 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 Recently transition from Xarelto to Eliquis History of pancytopenia was told by PCP this is due to alcohol abuse  No weight loss, no chills no night sweats  No CP but worsening SOB  Has had leg swelling this week but has gone down  No fall \ no syncope  No bleeding  Walks every day  Drinks 1.5 L now of wine a day  1 beer in AM  Has been drinking for many years Had hx of DT few years ago      Regarding pertinent Chronic problems:     Hyperlipidemia -  on statins Crestor Lipid Panel  No results found for: "CHOL", "TRIG", "HDL", "CHOLHDL", "VLDL", "LDLCALC", "LDLDIRECT", "LABVLDL"   HTN on Hyzaar     obesity-   BMI Readings from Last 1 Encounters:  02/02/23 30.05 kg/m        COPD - not  followed by pulmonology   not  on baseline oxygen      On Spiriva   OSA -  noncompliant with CPAP    Hx of DVT/PE on - anticoagulation with Eliquis,     While in ER: Clinical Course as of 02/02/23 2135  Wed Feb 02, 2023  1247 Patient pan-cytopenic, essentially unchanged from outpatient labs 2 days ago but new compared to 4 months ago. Unclear etiology per his PCP records. LFTs and coags will be performed to evaluate for hepatic dysfunction. [VK]  1432 Patient has left subclavian thrombus in the setting of Eliquis at home. Patient reports PCP thinks pancytopenia is secondary to alcohol use disorder and recommended follow up for repeat labs. He states he is cutting down from 1.5 L of wine per day to 1.25 L. He states he was admitted to the hospital years ago for DT.  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 Also newly pancytopenic on labs 2 days ago. Has new blood clot on [place] and will be admitted for treatment failure.  [RP]  3500 Dr Earnest Conroy [RP]    Clinical Course User Index [RP] Fransico Meadow, MD [VK] Selmer, Victoria K, DO    US Exam  positive for thrombus within the left subclavian vein.  Was started on heparin    CXR - Minimal right perihilar subsegmental atelectasis or scarring.     Following Medications were ordered in ER: Medications  LORazepam (ATIVAN) tablet 1-4 mg (has no administration in time range)    Or  LORazepam (ATIVAN) injection 1-4 mg (has no administration in time range)  thiamine (VITAMIN B1) tablet 100 mg ( Oral See Alternative 02/02/23 1532)    Or  thiamine (VITAMIN B1) injection 100 mg (100 mg Intravenous Given 0/27/25 3664)  folic acid (FOLVITE) tablet 1 mg (1 mg Oral Given 02/02/23 1532)  multivitamin with minerals tablet 1 tablet (1 tablet Oral Given 02/02/23 1530)  heparin ADULT infusion 100 units/mL (25000 units/260mL) (1,400 Units/hr Intravenous New Bag/Given 02/02/23 1549)     _______________________________________________________ ER Provider Called:   Oncology     They Recommend admit to medicine   Will see in AM      ED Triage Vitals  Enc Vitals Group     BP 02/02/23 1118 (!) 146/72     Pulse Rate 02/02/23 1118 95     Resp 02/02/23 1118 16     Temp 02/02/23 1118 98.1 F (36.7 C)     Temp Source 02/02/23 1118 Oral     SpO2 02/02/23 1118 97 %     Weight 02/02/23 1120 203 lb 7.8 oz (92.3 kg)     Height 02/02/23 1120 5\' 9"  (1.753 m)     Head Circumference --      Peak Flow --      Pain Score 02/02/23 1939 0     Pain Loc --      Pain Edu? --      Excl. in [place]? --   TMAX(24)@     _________________________________________ Significant initial  Findings: Abnormal Labs Reviewed  BASIC METABOLIC PANEL - Abnormal; Notable for the following components:      Result Value   Glucose, Bld 101 (*)    Creatinine, Ser 0.57 (*)    All other components within normal limits  CBC - Abnormal; Notable for the following components:   WBC 1.5 (*)    RBC 2.74 (*)    Hemoglobin 10.8 (*)    HCT 30.2 (*)    MCV 110.2 (*)    MCH 39.4 (*)    RDW 15.7 (*)    Platelets 41 (*)    All other components within normal limits  CBC WITH DIFFERENTIAL/PLATELET - Abnormal; Notable for the following components:   WBC 1.4 (*)    RBC 2.69 (*)    Hemoglobin 10.6 (*)    HCT 30.0 (*)    MCV 111.5 (*)    MCH 39.4 (*)    RDW 15.8 (*)    Platelets 42 (*)    Neutro Abs 0.3 (*)    Monocytes Absolute 0.0 (*)    All other components within normal limits      ECG: Ordered Personally reviewed and interpreted by me showing: HR : 94 Rhythm:Normal sinus rhythm Rightward axis Borderline ECG QTC 435   The recent clinical data is shown below. Vitals:   02/02/23 1802 02/02/23 1945 02/02/23 1954 02/02/23 2039  BP:  137/84 (!) 164/84 (!) 162/72  Pulse:  87 91 89  Resp:  18  18  Temp: 98.3 F (36.8 C)   99.3 F (37.4 C)  TempSrc: Oral   Oral  SpO2:  96%  100%  Weight:      Height:  WBC     Component Value Date/Time   WBC 1.4 (LL) 02/02/2023 1325   LYMPHSABS 0.9 02/02/2023 1325   LYMPHSABS 1.8 09/07/2017 1051   MONOABS 0.0 (L) 02/02/2023 1325   MONOABS 0.9 09/07/2017 1051   EOSABS 0.0 02/02/2023 1325   EOSABS 0.2 09/07/2017 1051   BASOSABS 0.0 02/02/2023 1325   BASOSABS 0.1 09/07/2017 1051     Lactic Acid, Venous    Component Value Date/Time   LATICACIDVEN 1.0 06/06/2021 1715      UA  ordered     Results for orders placed or performed during the hospital encounter of 08/27/21  Aerobic Culture w Gram Stain (superficial specimen)     Status: None   Collection Time: 08/26/21  1:15 PM   Specimen: Wound  Result Value Ref Range Status   Specimen Description   Final    WOUND RT LATERAL FOOT Performed at [facility] [address], [place], [place] [address]    Special Requests   Final    NONE Performed at [facility], [place] [address], [place], [place] [address]    Gram Stain   Final    NO WBC SEEN FEW GRAM NEGATIVE COCCOBACILLI Performed at [facility], [place] [address] [name], [place] [address]    Culture ABUNDANT ENTEROBACTER CLOACAE  Final   Report Status 08/30/2021 FINAL  Final   Organism ID, Bacteria ENTEROBACTER CLOACAE  Final      Susceptibility   Enterobacter cloacae - MIC*    CEFAZOLIN >=64 RESISTANT Resistant     CEFEPIME <=0.12 SENSITIVE Sensitive     CEFTAZIDIME <=1 SENSITIVE Sensitive     CIPROFLOXACIN <=0.25 SENSITIVE Sensitive     GENTAMICIN <=1 SENSITIVE Sensitive     IMIPENEM 0.5 SENSITIVE Sensitive     TRIMETH/SULFA <=20 SENSITIVE Sensitive     PIP/TAZO <=4 SENSITIVE Sensitive     * ABUNDANT ENTEROBACTER CLOACAE     _______________________________________________ Hospitalist was called for admission for   Pancytopenia ([place])    Acute embolism and thrombosis of left subclavian vein (HCC)    Alcohol use disorder    The following Work up has been ordered so  far:  Orders Placed This Encounter  Procedures   DG Chest 2 View   US Venous Img Upper Uni Left   Basic metabolic panel   CBC   Brain natriuretic peptide   Hepatic function panel   Protime-INR   CBC with Differential/Platelet   Pathologist smear review   Heparin level (unfractionated)   APTT   CBC   APTT   Heparin level (unfractionated)   Document Height and Actual Weight   Vital signs every 6 hours X 48 hours, then per unit protocol   Refer to Sidebar Report for reference: [place] (CIWA)   If Ativan given, reassess [facility] Withdrawal Assessment (CIWA) q 1 hour   Notify Pharmacy to change IV Ativan to PO if tolerating POs well.   Notify physician (specify)   Pharmacy Instructions To Nursing: No heparin bolus   heparin per pharmacy consult   Consult to Transition of Care Team   ED EKG   Admit to Inpatient (patient's expected length of stay will be greater than 2 midnights or inpatient only procedure)     OTHER Significant initial  Findings:  labs showing:  Recent Labs  Lab 02/02/23 1135  NA 136  K 4.4  CO2 23  GLUCOSE 101*  BUN  8  CREATININE 0.57*  CALCIUM 9.5    Cr    stable,   Lab Results  Component Value Date   CREATININE 0.57 (L) 02/02/2023   CREATININE 0.62 09/20/2022   CREATININE 0.74 09/22/2021    Recent Labs  Lab 02/02/23 1135  AST 16  ALT 14  ALKPHOS 69  BILITOT 0.6  PROT 6.7  ALBUMIN 4.1   Lab Results  Component Value Date   CALCIUM 9.5 02/02/2023    Plt: Lab Results  Component Value Date   PLT 42 (L) 02/02/2023    COVID-19 Labs  No results for input(s): "DDIMER", "FERRITIN", "LDH", "CRP" in the last 72 hours.  Lab Results  Component Value Date   [place] Not Detected 12/24/2019       Recent Labs  Lab 02/02/23 1135 02/02/23 1325  WBC 1.5* 1.4*  NEUTROABS  --  0.3*  HGB 10.8* 10.6*  HCT 30.2* 30.0*  MCV 110.2* 111.5*  PLT 41* 42*    HG/HCT  stable,     Component Value Date/Time   HGB 10.6 (L) 02/02/2023 1325   HGB 13.9 09/20/2022 1524   HGB 15.3 09/07/2017 1051   HCT 30.0 (L) 02/02/2023 1325   HCT 45.2 09/07/2017 1051   MCV 111.5 (H) 02/02/2023 1325   MCV 95.8 09/07/2017 1051     Cardiac Panel (last 3 results) No results for input(s): "CKTOTAL", "CKMB", "TROPONINI", "RELINDX" in the last 72 hours.  .car BNP (last 3 results) Recent Labs    02/02/23 1135  BNP 16.4      DM  labs:  HbA1C: No results for input(s): "HGBA1C" in the last 8760 hours.     CBG (last 3)  No results for input(s): "GLUCAP" in the last 72 hours.        Cultures:    Component Value Date/Time   SDES  08/26/2021 1315    WOUND RT LATERAL FOOT Performed at [facility], [place] [address], [place], [place] [address]    SPECREQUEST  08/26/2021 1315    NONE Performed at [facility], [place] [address], [place], [place] [address]    CULT ABUNDANT ENTEROBACTER CLOACAE 08/26/2021 1315   REPTSTATUS 08/30/2021 FINAL 08/26/2021 1315     Radiological Exams on Admission: US Venous Img Upper Uni Left  Result Date: 02/02/2023 CLINICAL DATA:  Left upper extremity swelling for 3-4 days. EXAM: LEFT UPPER EXTREMITY VENOUS DOPPLER ULTRASOUND TECHNIQUE: Gray-scale sonography with graded compression, as well as color Doppler and duplex ultrasound were performed to evaluate the upper extremity deep venous system from the level of the subclavian vein and including the jugular, axillary, basilic, radial, ulnar and upper cephalic vein. Spectral Doppler was utilized to evaluate flow at rest and with distal augmentation maneuvers. COMPARISON:  None Available. FINDINGS: Contralateral Subclavian Vein: Respiratory phasicity is normal and symmetric with the symptomatic side. No evidence of thrombus. Normal compressibility. Internal Jugular Vein: No evidence of thrombus. Normal compressibility, respiratory phasicity and response to  augmentation. Subclavian Vein: Thrombus identified within the left subclavian. Axillary Vein: No evidence of thrombus. Normal compressibility, respiratory phasicity and response to augmentation. Cephalic Vein: No evidence of thrombus. Normal compressibility, respiratory phasicity and response to augmentation. Basilic Vein: No evidence of thrombus. Normal compressibility, respiratory phasicity and response to augmentation. Brachial Veins: No evidence of thrombus. Normal compressibility, respiratory phasicity and response to augmentation. Radial Veins: No evidence of thrombus. Normal compressibility, respiratory phasicity and response to augmentation. Ulnar Veins: No evidence of thrombus. Normal compressibility, respiratory  phasicity and response to augmentation. Venous Reflux:  None visualized. Other Findings:  None visualized. IMPRESSION: Exam positive for thrombus within the left subclavian vein. These results were called by telephone at the time of interpretation on 02/02/2023 at 1:37 pm to provider [facility] , who verbally acknowledged these results. Electronically Signed   By: Kerby Moors M.D.   On: 02/02/2023 13:39   DG Chest 2 View  Result Date: 02/02/2023 CLINICAL DATA:  Shortness of breath. EXAM: CHEST - 2 VIEW COMPARISON:  January 03, 2018. FINDINGS: The heart size and mediastinal contours are within normal limits. Old left rib fractures are noted. Minimal right perihilar subsegmental atelectasis or scarring is noted. Left lung is unremarkable. IMPRESSION: Minimal right perihilar subsegmental atelectasis or scarring. Electronically Signed   By: Marijo Conception M.D.   On: 02/02/2023 12:05   _______________________________________________________________________________________________________ Latest  Blood pressure (!) 162/72, pulse 89, temperature 99.3 F (37.4 C), temperature source Oral, resp. rate 18, height 5\' 9"  (1.753 m), weight 92.3 kg, SpO2 100 %.   Vitals  labs and radiology  finding personally reviewed  Review of Systems:    Pertinent positives include: left arm swelling  Constitutional:  No weight loss, night sweats, Fevers, chills, fatigue, weight loss  HEENT:  No headaches, Difficulty swallowing,Tooth/dental problems,Sore throat,  No sneezing, itching, ear ache, nasal congestion, post nasal drip,  Cardio-vascular:  No chest pain, Orthopnea, PND, anasarca, dizziness, palpitations.no Bilateral lower extremity swelling  GI:  No heartburn, indigestion, abdominal pain, nausea, vomiting, diarrhea, change in bowel habits, loss of appetite, melena, blood in stool, hematemesis Resp:  no shortness of breath at rest. No dyspnea on exertion, No excess mucus, no productive cough, No non-productive cough, No coughing up of blood.No change in color of mucus.No wheezing. Skin:  no rash or lesions. No jaundice GU:  no dysuria, change in color of urine, no urgency or frequency. No straining to urinate.  No flank pain.  Musculoskeletal:  No joint pain or no joint swelling. No decreased range of motion. No back pain.  Psych:  No change in mood or affect. No depression or anxiety. No memory loss.  Neuro: no localizing neurological complaints, no tingling, no weakness, no double vision, no gait abnormality, no slurred speech, no confusion  All systems reviewed and apart from [place] all are negative _______________________________________________________________________________________________ Past Medical History:   Past Medical History:  Diagnosis Date   Adenocarcinoma of right lung, stage 1 ([place]) 03/03/2017   Cancer ([place])    lung cancer 2005   Cancer ([place])    skin cancer, mouth cancer   COPD (chronic obstructive pulmonary disease) (HCC)    Glaucoma    Hereditary and idiopathic peripheral neuropathy 09/26/2014   Hyperlipidemia    Hypertension    Ischemic colitis ([place])    Left subclavian vein thrombosis ([place])    Polio    Sleep apnea    no CPAP      Past  Surgical History:  Procedure Laterality Date   HERNIA REPAIR     left ingunal   LUNG REMOVAL, PARTIAL Left    SEGMENTECOMY Right 01/12/2017   Procedure: RIGHT SUPERIOR SEGMENTECTOMY;  Surgeon: Melrose Nakayama, MD;  Location: [facility];  Service: Thoracic;  Laterality: Right;   SUBCLAVIAN STENT PLACEMENT     TONSILLECTOMY     VIDEO ASSISTED THORACOSCOPY Right 01/12/2017   Procedure: VIDEO ASSISTED THORACOSCOPY;  Surgeon: Melrose Nakayama, MD;  Location: [employer];  Service: Thoracic;  Laterality: Right;    Social  History:  Ambulatory   independently     reports that he quit smoking about 20 years ago. His smoking use included cigarettes. He has a 80.00 pack-year smoking history. He has never used smokeless tobacco. He reports current alcohol use of about 42.0 standard drinks of alcohol per week. He reports that he does not use drugs.   Family History:   Family History  Problem Relation Age of Onset   Cancer Mother        intestine   Hyperlipidemia Mother    Heart Problems Brother    Heart attack Brother 55       s/p CABG and redo 5 years later   Heart attack Father    Heart disease Father    Colon cancer Maternal Grandmother    Colon cancer Maternal Aunt    ______________________________________________________________________________________________ Allergies: Allergies  Allergen Reactions   No Known Allergies      Prior to Admission medications   Medication Sig Start Date End Date Taking? Authorizing Provider  albuterol (VENTOLIN HFA) 108 (90 Base) MCG/ACT inhaler 1-2 puffs as needed    [provider]  Bacillus Coagulans-Inulin (PROBIOTIC) 1-250 BILLION-MG CAPS Take 1 capsule by mouth daily.    [provider]  Brimonidine Tartrate (LUMIFY) 0.025 % SOLN 1 drop into affected eye  as needed    [provider]  carvedilol (COREG) 3.125 MG tablet Take 3.125 mg by mouth 2 (two) times daily with a meal.    [provider]  ELIQUIS 2.5 MG  TABS tablet Take 2.5 mg by mouth 2 (two) times daily. 06/09/22   [provider]  latanoprost (XALATAN) 0.005 % ophthalmic solution Place 1 drop into both eyes at bedtime.    [provider]  loperamide (IMODIUM A-D) 2 MG tablet 1 tablet as needed    [provider]  losartan (COZAAR) 50 MG tablet Take 1 tablet by mouth daily. 04/25/21   [provider]  losartan-hydrochlorothiazide Konrad Penta) 100-12.5 MG tablet SMARTSIG:1 By Mouth 03/16/22   [provider]  Multiple Vitamin (MULTIVITAMIN WITH MINERALS) TABS tablet Take 1 tablet by mouth daily.    [provider]  rosuvastatin (CRESTOR) 5 MG tablet Take 5 mg by mouth daily. 07/10/20   [provider]  SPIRIVA HANDIHALER 18 MCG inhalation capsule 1 capsule daily. 08/26/20   [provider]    ___________________________________________________________________________________________________ Physical Exam:    02/02/2023    8:39 PM 02/02/2023    7:54 PM 02/02/2023    7:45 PM  Vitals with BMI  Systolic 573 220 254  Diastolic 72 84 84  Pulse 89 91 87    1. General:  in No  Acute distress  -appearing 2. Psychological: Alert and   Oriented 3. Head/ENT:    Dry Mucous Membranes                          Head Non traumatic, neck supple                         Poor Dentition 4. SKIN:  decreased Skin turgor,  Skin clean Dry and intact no rash 5. Heart: Regular rate and rhythm no  Murmur, no Rub or gallop 6. Lungs: no wheezes or crackles   7. Abdomen: Soft,  non-tender, Non distended   obese  bowel sounds present 8. Lower extremities: no clubbing, cyanosis, no  edema Upper extremity Left arm  swelling  9.  Neurologically Grossly intact, moving all 4 extremities equally   10. MSK: Normal range of motion    Chart has been reviewed  ______________________________________________________________________________________________  Assessment/Plan  77 y.o. male with medical history  significant of history of lung cancer status post radiation with known history of prior subclavian thrombosis status post stenting x 2 in 2007, COPD, HLD, HTN, ischemic colitis, sleep apnea  Admitted for   Pancytopenia ([place])    Acute embolism and thrombosis of left subclavian vein ([place])    Alcohol use disorder     Present on Admission:  Subclavian vein thrombosis ([place])  Hyperlipidemia  COPD GOLD II   OSA (obstructive sleep apnea)  Alcohol abuse  Malignant neoplasm of lower lobe, unspecified bronchus or lung ([place])  Pancytopenia ([place])     Subclavian vein thrombosis ([place]) In the setting of being on Eliquis. Patient currently on heparin drip. IR consult in a.m. May need to coordinate if patient needs platelets administration prior to proceeding Given progressive shortness of breath we will obtain CTA to rule out PE  Hyperlipidemia Continue Crestor 5 mg p.o. daily  COPD GOLD II  Chronic stable continue home medications  OSA (obstructive sleep apnea) Noncompliant with CPAP  Alcohol abuse Order CIWA protocol pancytopenia could be related to alcohol abuse.  Evaluate for B12 and thiamine and folate deficiency  Malignant neoplasm of lower lobe, unspecified bronchus or lung [facility]) Oncology aware the patient is being admitted  Pancytopenia [facility])  discussed with oncology on-call. Suspect secondary to alcohol abuse. Pathology smear pending. Evaluate for dietary deficiencies Oncology will see on-call in the morning Obtain right upper quadrant ultrasound to evaluate liver for cirrhosis    Other plan as per orders.  DVT prophylaxis:  heparin    Code Status:    Code Status: Prior FULL CODE as per patient  I had personally discussed CODE STATUS with patient     Family Communication:   Family not at  Bedside    Disposition Plan:   To home once workup is complete and patient is stable    Following barriers for discharge:                                                            Anemia stable                                                       Will need consultants to evaluate patient prior to discharge                     Transition of care consulted               Consults called: emailed oncology  IR consult  Admission status:  ED Disposition     ED Disposition  Admit   Condition  --   [place]: Caleb [[id number]]  Level of Care: Progressive [102]  Admit to Progressive based on following criteria: CARDIOVASCULAR & THORACIC of moderate stability with acute coronary syndrome symptoms/low risk myocardial infarction/hypertensive urgency/arrhythmias/heart failure potentially compromising stability and stable post cardiovascular intervention patients.  May admit patient  to [facility] or [facility] if equivalent level of care is available:: No  Interfacility transfer: Yes  Covid Evaluation: Asymptomatic - no recent exposure (last 10 days) testing not required  Diagnosis: Subclavian vein thrombosis [facility]) [[id number]]  Admitting Physician: Guilford Shi [[id number]]  Attending Physician: Guilford Shi [[id number]]  Certification:: I certify this patient will need inpatient services for at least 2 midnights  Estimated Length of Stay: 4           inpatient     I Expect 2 midnight stay secondary to severity of patient's current illness need for inpatient interventions justified by the following:  hemodynamic instability despite optimal treatment     Severe lab/radiological/exam abnormalities including:    Subclavian DVT and extensive comorbidities including:  substance abuse    Chronic anticoagulation  That are currently affecting medical management.   I expect  patient to be hospitalized for 2 midnights requiring inpatient medical care.  Patient is at high risk for adverse outcome (such as loss of life or disability) if not treated.  Indication for inpatient stay as follows:    Need for operative/procedural   intervention    Need for IV heparin    Level of care   progressive tele indefinitely please discontinue once patient no longer qualifies COVID-19 Labs    Ashanti Everett 02/02/2023, 11:47 PM    [facility]     after 2 AM please page floor coverage PA If 7AM-7PM, please contact the day team taking care of the patient using [email or web address]   Patient was evaluated in the context of the global COVID-19 pandemic, which necessitated consideration that the patient might be at risk for infection with the SARS-CoV-2 virus that causes COVID-19. Institutional protocols and algorithms that pertain to the evaluation of patients at risk for COVID-19 are in a state of rapid change based on information released by regulatory bodies including the CDC and federal and state organizations. These policies and algorithms were followed during the patient's care.

## 2023-02-02 NOTE — Assessment & Plan Note (Signed)
Order CIWA protocol pancytopenia could be related to alcohol abuse.  Evaluate for B12 and thiamine and folate deficiency

## 2023-02-02 NOTE — Progress Notes (Signed)
ANTICOAGULATION CONSULT NOTE - Initial Consult  Pharmacy Consult for Heparin Indication:  left subclavian thrombus  Allergies  Allergen Reactions   No Known Allergies     Patient Measurements: Height: 5\' 9"  (175.3 cm) Weight: 92.3 kg (203 lb 7.8 oz) IBW/kg (Calculated) : 70.7 Heparin Dosing Weight: 89.6 kg  Vital Signs: Temp: 98.1 F (36.7 C) (02/14 1118) Temp Source: Oral (02/14 1118) BP: 142/66 (02/14 1345) Pulse Rate: 78 (02/14 1345)  Labs: Recent Labs    02/02/23 1135 02/02/23 1248 02/02/23 1325  HGB 10.8*  --  10.6*  HCT 30.2*  --  30.0*  PLT 41*  --  42*  LABPROT  --  13.4  --   INR  --  1.0  --   CREATININE 0.57*  --   --     Estimated Creatinine Clearance: 89.5 mL/min (A) (by C-G formula based on SCr of 0.57 mg/dL (L)).   Medical History: Past Medical History:  Diagnosis Date   Adenocarcinoma of right lung, stage 1 ([place]) 03/03/2017   Cancer ([place])    lung cancer 2005   Cancer ([place])    skin cancer, mouth cancer   COPD (chronic obstructive pulmonary disease) (HCC)    Glaucoma    Hereditary and idiopathic peripheral neuropathy 09/26/2014   Hyperlipidemia    Hypertension    Ischemic colitis ([place])    Left subclavian vein thrombosis (HCC)    Polio    Sleep apnea    no CPAP    Medications:  Apixaban 2.5 po BID PTA for L-subclavian thrombus  Assessment: 77 years of age male on Apixaban prior to admission for left subclavian thrombus status post stent (noted to be at lower dose of 2.5 po BID) who presented to the ED with arm swelling for past 3-4 days and worsening shortness of breath. Patient found to have acute thrombosis of left subclavian vein and pancytopenia. Pancytopenia possibly related to patients alcohol use disorder.   Current Hgb 10.7 and platelets 47. No bleeding noted. SCr 0.57.  Last Apixaban dose per patient was 02/02/23 at 08:00 AM.  Patient reports no missed doses.   Discussed bleed risk in setting of pancytopenia and low home  Apixaban dose with Dr. Maylon Peppers who wishes to start IV heparin now as patient has acute clot  despite home dose of Apixaban but will not give a bolus.    Heparin level will be elevated falsely in setting of recent Apixaban - so will utilize aPTT for monitoring until levels are correlating.   Goal of Therapy:  Heparin level 0.3-0.7 units/ml Monitor platelets by anticoagulation protocol: Yes   Plan:  No Heparin bolus with pancytopenia and recent Apixaban dose.  Start IV Heparin at 1400 units/hr (~16 units/kg/hr).  aPTT and Heparin level in ~6-8 hours.  Daily aPTT and Heparin level until correlating with recent Apixaban use.  Daily CBC and monitor closely for bleeding.   Sloan Leiter, PharmD, BCPS, BCCCP Clinical Pharmacist Please refer to [facility] for [place] numbers 02/02/2023,2:41 PM

## 2023-02-02 NOTE — ED Notes (Signed)
CRITICAL VALUE STICKER  CRITICAL VALUE:total wbc 1.4 neutrophils 0.3  RECEIVER (on-site recipient of call):  DATE & TIME NOTIFIED:   MESSENGER (representative from lab):  MD NOTIFIED: Dr. Maylon Peppers  TIME OF NOTIFICATION:1443  RESPONSE:

## 2023-02-02 NOTE — ED Provider Notes (Signed)
  Physical Exam  BP (!) 141/74 (BP Location: Right Arm)   Pulse 88   Temp 98.3 F (36.8 C) (Oral)   Resp 18   Ht 5\' 9"  (1.753 m)   Wt 92.3 kg   SpO2 95%   BMI 30.05 kg/m   Physical Exam  Procedures  Procedures  ED Course / MDM   Clinical Course as of 02/04/23 0051  Wed Feb 02, 2023  1247 Patient pan-cytopenic, essentially unchanged from outpatient labs 2 days ago but new compared to 4 months ago. Unclear etiology per his PCP records. LFTs and coags will be performed to evaluate for hepatic dysfunction. [VK]  1432 Patient has left subclavian thrombus in the setting of Eliquis at home. Patient reports PCP thinks pancytopenia is secondary to alcohol use disorder and recommended follow up for repeat labs. He states he is cutting down from 1.5 L of wine per day to 1.25 L. He states he was admitted to the hospital years ago for DT.  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 Also newly pancytopenic on labs 2 days ago. Has new blood clot on [place] and will be admitted for treatment failure.  [RP]  1624 Dr Earnest Conroy to admit for further management of his DVT and pancytopenia.  [RP]    Clinical Course User Index [RP] Fransico Meadow, MD [VK] Kemper Durie, DO   Medical Decision Making Amount and/or Complexity of Data Reviewed Labs: ordered. Radiology: ordered.  Risk OTC drugs. Prescription drug management. Decision regarding hospitalization.     Fransico Meadow, MD 02/04/23 [phone]

## 2023-02-02 NOTE — ED Notes (Signed)
[facility] at bedside 

## 2023-02-02 NOTE — ED Notes (Signed)
Iona Beard at [facility] will have Hospitalist Paged for Dr. Adonis Housekeeper.-ABB(NS)

## 2023-02-02 NOTE — Assessment & Plan Note (Addendum)
discussed with oncology on-call. Suspect secondary to alcohol abuse. Pathology smear pending. Evaluate for dietary deficiencies Oncology will see on-call in the morning Obtain right upper quadrant ultrasound to evaluate liver for cirrhosis

## 2023-02-02 NOTE — Subjective & Objective (Signed)
Presents with left eye swelling for the past 3 to 4 days shortness of breath started to get worse for past few months. 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

## 2023-02-02 NOTE — Assessment & Plan Note (Signed)
Noncompliant with CPAP

## 2023-02-02 NOTE — Assessment & Plan Note (Signed)
Oncology aware the patient is being admitted

## 2023-02-02 NOTE — ED Triage Notes (Signed)
Patient here POV from Home.  Endorses Left Arm Swelling that began 3-4 Days ago. Also notes SOB that began to worsen over the past few months.   2007 had a Stent placed in Left Subclavian Artery. No Pain. No Fevers. Takes Eliquis.   NAD Noted during Triage. A&Ox4. GCS 15. Ambulatory.

## 2023-02-02 NOTE — ED Provider Notes (Signed)
[facility] Provider Note   CSN: [id number] Arrival date & time: 02/02/23  1100     History  Chief Complaint  Patient presents with   Arm Swelling    [name] is a 77 y.o. male.  Patient is a 77 year old male with a past medical history of prior lung cancer status post radiation therapy in remission, alcohol use disorder, hypertension left upper extremity DVTs with stents placed on Eliquis resenting to the emergency department with left upper extremity swelling.  The patient states that he has noticed increasing swelling starting in his hand going up his arm over the last 2 days.  He denies significant pain.  He states he has been taking his Eliquis as prescribed.  He states that he has had recurrent DVTs due to his history of radiation.  He denies any chest pain but he states that he has had some dyspnea on exertion and intermittent lower extremity swelling that resolves with raising his legs.        Home Medications Prior to Admission medications   Medication Sig Start Date End Date Taking? Authorizing Provider  albuterol (VENTOLIN HFA) 108 (90 Base) MCG/ACT inhaler 1-2 puffs as needed    [provider]  Bacillus Coagulans-Inulin (PROBIOTIC) 1-250 BILLION-MG CAPS Take 1 capsule by mouth daily.    [provider]  Brimonidine Tartrate (LUMIFY) 0.025 % SOLN 1 drop into affected eye  as needed    [provider]  carvedilol (COREG) 3.125 MG tablet Take 3.125 mg by mouth 2 (two) times daily with a meal.    [provider]  ELIQUIS 2.5 MG TABS tablet Take 2.5 mg by mouth 2 (two) times daily. 06/09/22   [provider]  latanoprost (XALATAN) 0.005 % ophthalmic solution Place 1 drop into both eyes at bedtime.    [provider]  loperamide (IMODIUM A-D) 2 MG tablet 1 tablet as needed    [provider]  losartan (COZAAR) 50 MG tablet Take 1 tablet by mouth daily. 04/25/21    [provider]  losartan-hydrochlorothiazide Konrad Penta) 100-12.5 MG tablet SMARTSIG:1 By Mouth 03/16/22   [provider]  Multiple Vitamin (MULTIVITAMIN WITH MINERALS) TABS tablet Take 1 tablet by mouth daily.    [provider]  rosuvastatin (CRESTOR) 5 MG tablet Take 5 mg by mouth daily. 07/10/20   [provider]  SPIRIVA HANDIHALER 18 MCG inhalation capsule 1 capsule daily. 08/26/20   [provider]      Allergies    No known allergies    Review of Systems   Review of Systems  Physical Exam Updated Vital Signs BP (!) 142/66 (BP Location: Right Arm)   Pulse 78   Temp 98.1 F (36.7 C) (Oral)   Resp 17   Ht 5\' 9"  (1.753 m)   Wt 92.3 kg   SpO2 98%   BMI 30.05 kg/m  Physical Exam Vitals and nursing note reviewed.  Constitutional:      General: He is not in acute distress.    Appearance: Normal appearance.  HENT:     Head: Normocephalic and atraumatic.     Nose: Nose normal.     Mouth/Throat:     Mouth: Mucous membranes are moist.     Pharynx: Oropharynx is clear.  Eyes:     Conjunctiva/sclera: Conjunctivae normal.  Cardiovascular:     Rate and Rhythm: Normal rate and regular rhythm.     Heart sounds: Normal heart sounds.  Pulmonary:     Effort: Pulmonary effort is normal.     Breath sounds: Normal breath sounds.  Abdominal:     General: Abdomen is flat.     Palpations: Abdomen is soft.     Tenderness: There is no abdominal tenderness.  Musculoskeletal:        General: Normal range of motion.     Cervical back: Normal range of motion and neck supple.     Comments: Nonpitting edema to left hand up to the left forearm, no skin changes 1+ edema to ankles and bilateral lower extremities, no calf pain or swelling  Skin:    General: Skin is warm and dry.  Neurological:     General: No focal deficit present.     Mental Status: He is alert and oriented to person, place, and time.     Sensory: No sensory deficit.     Motor: No  weakness.  Psychiatric:        Mood and Affect: Mood normal.        Behavior: Behavior normal.     ED Results / Procedures / Treatments   Labs (all labs ordered are listed, but only abnormal results are displayed) Labs Reviewed  BASIC METABOLIC PANEL - Abnormal; Notable for the following components:      Result Value   Glucose, Bld 101 (*)    Creatinine, Ser 0.57 (*)    All other components within normal limits  CBC - Abnormal; Notable for the following components:   WBC 1.5 (*)    RBC 2.74 (*)    Hemoglobin 10.8 (*)    HCT 30.2 (*)    MCV 110.2 (*)    MCH 39.4 (*)    RDW 15.7 (*)    Platelets 41 (*)    All other components within normal limits  CBC WITH DIFFERENTIAL/PLATELET - Abnormal; Notable for the following components:   WBC 1.4 (*)    RBC 2.69 (*)    Hemoglobin 10.6 (*)    HCT 30.0 (*)    MCV 111.5 (*)    MCH 39.4 (*)    RDW 15.8 (*)    Platelets 42 (*)    Neutro Abs 0.3 (*)    Monocytes Absolute 0.0 (*)    All other components within normal limits  BRAIN NATRIURETIC PEPTIDE  HEPATIC FUNCTION PANEL  PROTIME-INR  PATHOLOGIST SMEAR REVIEW  APTT  HEPARIN LEVEL (UNFRACTIONATED)    EKG EKG Interpretation  Date/Time:  Wednesday February 02 2023 11:26:44 EST Ventricular Rate:  94 PR Interval:  180 QRS Duration: 78 QT Interval:  348 QTC Calculation: 435 R Axis:   92 Text Interpretation: Normal sinus rhythm Rightward axis Borderline ECG When compared with ECG of 03-Jan-2017 12:49, No significant change was found Confirmed by Leanord Asal ([id number]) on 02/02/2023 12:10:17 PM  Radiology US Venous Img Upper Uni Left  Result Date: 02/02/2023 CLINICAL DATA:  Left upper extremity swelling for 3-4 days. EXAM: LEFT UPPER EXTREMITY VENOUS DOPPLER ULTRASOUND TECHNIQUE: Gray-scale sonography with graded compression, as well as color Doppler and duplex ultrasound were performed to evaluate the upper extremity deep venous system from the level of the subclavian vein and  including the jugular, axillary, basilic, radial, ulnar and upper cephalic vein. Spectral Doppler was utilized to evaluate flow at rest and with distal augmentation maneuvers. COMPARISON:  None Available. FINDINGS: Contralateral Subclavian Vein: Respiratory phasicity is normal and symmetric with the symptomatic side. No evidence of thrombus. Normal compressibility. Internal Jugular Vein: No evidence of  thrombus. Normal compressibility, respiratory phasicity and response to augmentation. Subclavian Vein: Thrombus identified within the left subclavian. Axillary Vein: No evidence of thrombus. Normal compressibility, respiratory phasicity and response to augmentation. Cephalic Vein: No evidence of thrombus. Normal compressibility, respiratory phasicity and response to augmentation. Basilic Vein: No evidence of thrombus. Normal compressibility, respiratory phasicity and response to augmentation. Brachial Veins: No evidence of thrombus. Normal compressibility, respiratory phasicity and response to augmentation. Radial Veins: No evidence of thrombus. Normal compressibility, respiratory phasicity and response to augmentation. Ulnar Veins: No evidence of thrombus. Normal compressibility, respiratory phasicity and response to augmentation. Venous Reflux:  None visualized. Other Findings:  None visualized. IMPRESSION: Exam positive for thrombus within the left subclavian vein. These results were called by telephone at the time of interpretation on 02/02/2023 at 1:37 pm to provider [facility] , who verbally acknowledged these results. Electronically Signed   By: Kerby Moors M.D.   On: 02/02/2023 13:39   DG Chest 2 View  Result Date: 02/02/2023 CLINICAL DATA:  Shortness of breath. EXAM: CHEST - 2 VIEW COMPARISON:  January 03, 2018. FINDINGS: The heart size and mediastinal contours are within normal limits. Old left rib fractures are noted. Minimal right perihilar subsegmental atelectasis or scarring is noted. Left  lung is unremarkable. IMPRESSION: Minimal right perihilar subsegmental atelectasis or scarring. Electronically Signed   By: Marijo Conception M.D.   On: 02/02/2023 12:05    Procedures Procedures    Medications Ordered in ED Medications  LORazepam (ATIVAN) tablet 1-4 mg (has no administration in time range)    Or  LORazepam (ATIVAN) injection 1-4 mg (has no administration in time range)  thiamine (VITAMIN B1) tablet 100 mg ( Oral See Alternative 02/02/23 1532)    Or  thiamine (VITAMIN B1) injection 100 mg (100 mg Intravenous Given 7/74/12 8786)  folic acid (FOLVITE) tablet 1 mg (1 mg Oral Given 02/02/23 1532)  multivitamin with minerals tablet 1 tablet (1 tablet Oral Given 02/02/23 1530)  heparin ADULT infusion 100 units/mL (25000 units/298mL) (1,400 Units/hr Intravenous New Bag/Given 02/02/23 1549)    ED Course/ Medical Decision Making/ A&P Clinical Course as of 02/02/23 1558  Wed Feb 02, 2023  1247 Patient pan-cytopenic, essentially unchanged from outpatient labs 2 days ago but new compared to 4 months ago. Unclear etiology per his PCP records. LFTs and coags will be performed to evaluate for hepatic dysfunction. [VK]  1432 Patient has left subclavian thrombus in the setting of Eliquis at home. Patient reports PCP thinks pancytopenia is secondary to alcohol use disorder and recommended follow up for repeat labs. He states he is cutting down from 1.5 L of wine per day to 1.25 L. He states he was admitted to the hospital years ago for DT.  [VK]  1530 77 yo M with hx of etoh abuse and recurrent subclavian vein thromboses on xarelto. Also newly pancytopenic on labs 2 days ago. Has new blood clot on [place] and will be admitted for treatment failure.  [RP]    Clinical Course User Index [RP] Fransico Meadow, MD [VK] Kemper Durie, DO                             Medical Decision Making This patient presents to the ED with chief complaint(s) of left upper extremity swelling with pertinent  past medical history of prior left subclavian DVT with stent placement on Eliquis, hypertension,, alcohol use disorder, prior lung cancer status postradiation which  further complicates the presenting complaint. The complaint involves an extensive differential diagnosis and also carries with it a high risk of complications and morbidity.    The differential diagnosis includes DVT, patient has good pulses and arm is warm and well-perfused making arterial occlusion unlikely, pulmonary edema, CHF  Additional history obtained: Additional history obtained from family Records reviewed Primary Care Documents  ED Course and Reassessment: Patient will have labs performed to evaluate for coagulopathy as well as chest x-ray left upper extremity ultrasound to evaluate for DVT and other causes of shortness of breath and he will be closely reassessed.  Independent labs interpretation:  The following labs were independently interpreted: Pancytopenia  Independent visualization of imaging: - I independently visualized the following imaging with scope of interpretation limited to determining acute life threatening conditions related to emergency care: Chest x-ray, left upper extremity ultrasound, which revealed no acute disease on chest x-ray, acute subclavian thrombus on ultrasound  Consultation: - Consulted or discussed management/test interpretation w/ external professional: Hospitalists  Consideration for admission or further workup: Requires admission for his failure of outpatient anticoagulation Social Determinants of health: Alcohol use disorder    Amount and/or Complexity of Data Reviewed Labs: ordered. Radiology: ordered.  Risk OTC drugs. Prescription drug management. Decision regarding hospitalization.          Final Clinical Impression(s) / ED Diagnoses Final diagnoses:  Pancytopenia ([place])  Acute embolism and thrombosis of left subclavian vein (HCC)  Alcohol use disorder     Rx / DC Orders ED Discharge Orders     None         Kemper Durie, DO 02/02/23 1558

## 2023-02-02 NOTE — Progress Notes (Signed)
77 year old male with history of lung CA s/p radiation treatment prior subclavian thrombosis requiring stent x 2 in 2007, recently transition from [facility] to [facility] in April 2022 for?  Recurrent thrombosis/pancytopenia presents with worsening left arm swelling and labs showing worsening pancytopenia.  Doppler now shows acute left subclavian vein thrombosis-IV heparin without bolus initiated at [facility] ED.  EDP to discuss with primary oncologist-Dr. Julien Nordmann and admitted to [facility] versus [facility]. Addendum 5.20pm: per Dr Philip Aspen at [facility] --could not reach Dr Earlie Server but on call MD advised to admit to [facility]. Admit order placed to progressive unit and bed placement informed to alert [facility] Hospitalist upon patients arrival there

## 2023-02-02 NOTE — Assessment & Plan Note (Addendum)
In the setting of being on Eliquis. Patient currently on heparin drip. IR consult in a.m. May need to coordinate if patient needs platelets administration prior to proceeding Given progressive shortness of breath we will obtain CTA to rule out PE

## 2023-02-02 NOTE — Assessment & Plan Note (Signed)
Continue Crestor 5 mg p.o. daily

## 2023-02-03 ENCOUNTER — Inpatient Hospital Stay (HOSPITAL_COMMUNITY): Payer: Medicare Other

## 2023-02-03 DIAGNOSIS — D61818 Other pancytopenia: Secondary | ICD-10-CM | POA: Diagnosis not present

## 2023-02-03 DIAGNOSIS — F1090 Alcohol use, unspecified, uncomplicated: Secondary | ICD-10-CM | POA: Diagnosis not present

## 2023-02-03 DIAGNOSIS — I82B12 Acute embolism and thrombosis of left subclavian vein: Secondary | ICD-10-CM | POA: Diagnosis not present

## 2023-02-03 DIAGNOSIS — G4733 Obstructive sleep apnea (adult) (pediatric): Secondary | ICD-10-CM | POA: Diagnosis not present

## 2023-02-03 HISTORY — PX: IR VENO/EXT/UNI LEFT: IMG675

## 2023-02-03 HISTORY — PX: IR US GUIDE VASC ACCESS LEFT: IMG2389

## 2023-02-03 LAB — COMPREHENSIVE METABOLIC PANEL
ALT: 16 U/L (ref 0–44)
AST: 16 U/L (ref 15–41)
Albumin: 3.2 g/dL — ABNORMAL LOW (ref 3.5–5.0)
Alkaline Phosphatase: 57 U/L (ref 38–126)
Anion gap: 7 (ref 5–15)
BUN: 9 mg/dL (ref 8–23)
CO2: 26 mmol/L (ref 22–32)
Calcium: 8.7 mg/dL — ABNORMAL LOW (ref 8.9–10.3)
Chloride: 103 mmol/L (ref 98–111)
Creatinine, Ser: 0.6 mg/dL — ABNORMAL LOW (ref 0.61–1.24)
GFR, Estimated: >60 mL/min (ref >60)
Glucose, Bld: 98 mg/dL (ref 70–99)
Potassium: 3.6 mmol/L (ref 3.5–5.1)
Sodium: 136 mmol/L (ref 135–145)
Total Bilirubin: 0.9 mg/dL (ref 0.3–1.2)
Total Protein: 6 g/dL — ABNORMAL LOW (ref 6.5–8.1)

## 2023-02-03 LAB — APTT: aPTT: 86 seconds — ABNORMAL HIGH (ref 24–36)

## 2023-02-03 LAB — PHOSPHORUS: Phosphorus: 3.9 mg/dL (ref 2.5–4.6)

## 2023-02-03 LAB — HEPARIN LEVEL (UNFRACTIONATED)
Heparin Unfractionated: 0.86 IU/mL — ABNORMAL HIGH (ref 0.30–0.70)
Heparin Unfractionated: 0.71 IU/mL — ABNORMAL HIGH (ref 0.30–0.70)

## 2023-02-03 LAB — IRON AND TIBC
Iron: 191 ug/dL — ABNORMAL HIGH (ref 45–182)
Saturation Ratios: 64 % — ABNORMAL HIGH (ref 17.9–39.5)
TIBC: 298 ug/dL (ref 250–450)
UIBC: 107 ug/dL

## 2023-02-03 LAB — VITAMIN B12: Vitamin B-12: 383 pg/mL (ref 180–914)

## 2023-02-03 LAB — TYPE AND SCREEN
ABO/RH(D): O POS
Antibody Screen: NEGATIVE

## 2023-02-03 LAB — PREALBUMIN: Prealbumin: 19 mg/dL (ref 18–38)

## 2023-02-03 LAB — MAGNESIUM: Magnesium: 2 mg/dL (ref 1.7–2.4)

## 2023-02-03 LAB — FOLATE: Folate: 31.3 ng/mL (ref >5.9)

## 2023-02-03 LAB — FERRITIN: Ferritin: 405 ng/mL — ABNORMAL HIGH (ref 24–336)

## 2023-02-03 LAB — LACTIC ACID, PLASMA
Lactic Acid, Venous: 1.2 mmol/L (ref 0.5–1.9)
Lactic Acid, Venous: 0.8 mmol/L (ref 0.5–1.9)

## 2023-02-03 LAB — TSH: TSH: 2.035 u[IU]/mL (ref 0.350–4.500)

## 2023-02-03 MED ORDER — SODIUM CHLORIDE 0.9% FLUSH: 10.0000 mL | INTRAVENOUS | Status: DC | PRN

## 2023-02-03 MED ORDER — ROSUVASTATIN CALCIUM 5 MG PO TABS
10.0000 mg | ORAL_TABLET | Freq: Every day | ORAL | Status: DC
  Administered 2023-02-04 – 2023-02-06 (×3): 10 mg via ORAL
  Filled 2023-02-03 (×2): qty 2
  Filled 2023-02-03: qty 1

## 2023-02-03 MED ORDER — SODIUM CHLORIDE 0.9% FLUSH
10.0000 mL | Freq: Two times a day (BID) | INTRAVENOUS | Status: DC
  Administered 2023-02-03 – 2023-02-06 (×7): 10 mL
  Administered 2023-02-07: 20 mL
  Administered 2023-02-08: 10 mL

## 2023-02-03 MED ORDER — UMECLIDINIUM BROMIDE 62.5 MCG/ACT IN AEPB
1.0000 | INHALATION_SPRAY | Freq: Every day | RESPIRATORY_TRACT | Status: DC
  Administered 2023-02-04 – 2023-02-06 (×3): 1 via RESPIRATORY_TRACT
  Filled 2023-02-03 (×2): qty 7

## 2023-02-03 MED ORDER — IOHEXOL 300 MG/ML  SOLN
50.0000 mL | Freq: Once | INTRAMUSCULAR | Status: AC | PRN
  Administered 2023-02-03: 20 mL via INTRAVENOUS

## 2023-02-03 MED ORDER — TIOTROPIUM BROMIDE MONOHYDRATE 18 MCG IN CAPS: 18.0000 ug | ORAL_CAPSULE | Freq: Every day | RESPIRATORY_TRACT | Status: DC

## 2023-02-03 MED ORDER — LIDOCAINE HCL 1 % IJ SOLN
INTRAMUSCULAR | Status: AC
  Filled 2023-02-03: qty 20

## 2023-02-03 MED ORDER — ROSUVASTATIN CALCIUM 5 MG PO TABS
5.0000 mg | ORAL_TABLET | Freq: Once | ORAL | Status: AC
  Administered 2023-02-03: 5 mg via ORAL
  Filled 2023-02-03: qty 1

## 2023-02-03 NOTE — Progress Notes (Signed)
ANTICOAGULATION CONSULT NOTE  Pharmacy Consult for Heparin Indication:  left subclavian thrombus  Allergies  Allergen Reactions   No Known Allergies     Patient Measurements: Height: 5\' 9"  (175.3 cm) Weight: 92.3 kg (203 lb 7.8 oz) IBW/kg (Calculated) : 70.7 Heparin Dosing Weight: 89.6 kg  Vital Signs: Temp: 99.1 F (37.3 C) (02/15 0010) Temp Source: Oral (02/15 0010) BP: 122/86 (02/15 0010) Pulse Rate: 88 (02/15 0010)  Labs: Recent Labs    02/02/23 1135 02/02/23 1248 02/02/23 1325 02/02/23 2255  HGB 10.8*  --  10.6*  --   HCT 30.2*  --  30.0*  --   PLT 41*  --  42*  --   APTT  --   --   --  52*  LABPROT  --  13.4  --   --   INR  --  1.0  --   --   HEPARINUNFRC  --   --   --  0.86*  CREATININE 0.57*  --   --   --   CKTOTAL  --   --   --  54     Estimated Creatinine Clearance: 89.5 mL/min (A) (by C-G formula based on SCr of 0.57 mg/dL (L)).   Medical History: Past Medical History:  Diagnosis Date   Adenocarcinoma of right lung, stage 1 ([place]) 03/03/2017   Cancer ([place])    lung cancer 2005   Cancer ([place])    skin cancer, mouth cancer   COPD (chronic obstructive pulmonary disease) (HCC)    Glaucoma    Hereditary and idiopathic peripheral neuropathy 09/26/2014   Hyperlipidemia    Hypertension    Ischemic colitis ([place])    Left subclavian vein thrombosis (HCC)    Polio    Sleep apnea    no CPAP    Medications:  Apixaban 2.5 po BID PTA for L-subclavian thrombus  Assessment: 77 years of age male on Apixaban prior to admission for left subclavian thrombus status post stent (noted to be at lower dose of 2.5 po BID) who presented to the ED with arm swelling for past 3-4 days and worsening shortness of breath. Patient found to have acute thrombosis of left subclavian vein and pancytopenia. Pancytopenia possibly related to patients alcohol use disorder.   Current Hgb 10.7 and platelets 47. No bleeding noted. SCr 0.57.  Last Apixaban dose per patient was 02/02/23  at 08:00 AM.  Patient reports no missed doses.   Discussed bleed risk in setting of pancytopenia and low home Apixaban dose with Dr. Maylon Peppers who wishes to start IV heparin now as patient has acute clot  despite home dose of Apixaban but will not give a bolus.    Heparin level will be elevated falsely in setting of recent Apixaban - so will utilize aPTT for monitoring until levels are correlating.   02/03/2023: Initial aPTT 52 seconds - subtherapeutic on IV heparin 1400 units/hr Heparin level 0.86- presumed to be falsely elevated due to recent apixaban use No bleeding or infusion related issues reported by RN  Goal of Therapy:  aPTT level 66-102 seconds Heparin level 0.3-0.7 units/ml Monitor platelets by anticoagulation protocol: Yes   Plan:  No Heparin bolus with pancytopenia and recent Apixaban dose.  Increase IV Heparin to 1600 units/hr  Recheck  aPTT and Heparin level in 8 hours.  Daily aPTT and Heparin level until correlating with recent Apixaban use.  Daily CBC and monitor closely for bleeding.   Netta Cedars, PharmD, BCPS 02/03/2023,12:26 AM

## 2023-02-03 NOTE — Procedures (Signed)
Interventional Radiology Procedure Note  Procedure: Left upper extremity venogram  Findings: Please refer to procedural dictation for full description. Total occlusion of the indwelling left subclavian stents with multiple mature appearing collaterals.    Complications: None immediate  Estimated Blood Loss: < 5 mL  Recommendations: Continue anticoagulation.   Plan for image guided bone marrow biopsy tomorrow am, please keep NPO after midnight.  I suspect this stent occlusion is chronic, however prior CTs over the years suggest there may have been at least partial patency.  I think the stents deserve a shot at recanalization, however this requires general anesthesia and several supplies not commonly stocked here at [facility] IR.  I think the best course of action may be to schedule with GA at [facility] over the next week or two in attempt to recanalize the stents.  I will discuss further with the patient tomorrow prior to bone marrow biopsy.   Ruthann Cancer, MD

## 2023-02-03 NOTE — Progress Notes (Signed)
Patient refused CPAP qhs.  Patient states that he is supposed to wear one but is unable to tolerate one.

## 2023-02-03 NOTE — Progress Notes (Signed)
Heparin was stopped due to procedure. 4 attempts were made to get pt PTT and unsuccessful; doctor notified and order for midline placement was put in; restarted heparin per pharmacy instructions and lab draw will be rescheduled

## 2023-02-03 NOTE — Progress Notes (Signed)
PROGRESS NOTE    [name]  OAC:[id number] DOB: 1946/02/10 DOA: 02/02/2023 PCP: Jodene Nam, MD     Brief Narrative:  77 y.o. WM PMHx Lung cancer s/p XRT, Hx prior subclavian thrombosis S/P stenting x 2 in 2007, COPD, HLD, HTN, ischemic colitis, OSA, EtOH abuse (Drinks 1.5 L wine/ day  1 beer in AM)    Presented with left arm swelling Presents with left eye swelling for the past 3 to 4 days shortness of breath started to get worse for past few months. 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  States understands plan of care.   Assessment & Plan: Covid vaccination;   Principal Problem:   Subclavian vein thrombosis (HCC) Active Problems:   Hyperlipidemia   COPD GOLD II    OSA (obstructive sleep apnea)   Alcohol abuse   Malignant neoplasm of lower lobe, unspecified bronchus or lung (HCC)   Pancytopenia (HCC)  Acute embolism and thrombosis of left subclavian vein ([place]) -2/15 venogram by IR pending. - 2/15 spoke at length with NP [facility] IR plan is for venogram today, depending upon finding will decide if patient requires graft, stent, filter.  However that will not be placed until 2/16 - 2/15 per NP [facility] IR patient will receive his bone marrow biopsy on 2/16 and will require general anesthesia at that time. -2/15 per NP [facility] IR new guidelines  anticoagulation does not need to be stopped for bone marrow biopsies, only for deep organ biopsies.. - Eliquis has already been stopped, continue patient on heparin drip. -SOB resolved  EtOH abuse - CIWA protocol -2/15 has not required any Ativan today.  EtOH liver cirrhosis?? - 2/15 abdominal ultrasound pending    Hyperlipidemia -Crestor 10 mg daily    COPD GOLD II  -Chronic stable continue home medications   OSA (obstructive sleep apnea) -Noncompliant with CPAP at home however will offer patient CPAP while hospitalized.   Malignant neoplasm of lower lobe, unspecified bronchus or lung ([place]) -Dr. Lorna Few patient's oncologist, aware the patient is being admitted   Pancytopenia [facility]) -Admitting physician discussed with oncology on-call. -Most likely multifactorial to include EtOH abuse, dietary deficiency, malignancy, liver cirrhosis.  Workup as above    Obesity( 30.05 kg/m.) -Address with PCP as outpatient         Mobility Assessment (last 72 hours)     Mobility Assessment     Row Name 02/02/23 2033           Does patient have an order for bedrest or is patient medically unstable No - Continue assessment       What is the highest level of mobility based on the progressive mobility assessment? Level 6 (Walks independently in room and hall) - Balance while walking in room without assist - Complete  DVT prophylaxis: Heparin drip Code Status: Full Family Communication: 2/15 wife at bedside for discussion of plan of care all questions answered Status is: Inpatient    Dispo: The patient is from: Home              Anticipated d/c is to: Home              Anticipated d/c date is: 3 days              Patient currently is not medically stable to d/c.      Consultants:  IR  Procedures/Significant Events:    I have personally reviewed and interpreted all radiology studies and my findings are as above.  VENTILATOR  SETTINGS:    Cultures   Antimicrobials:    Devices    LINES / TUBES:      Continuous Infusions:  sodium chloride 100 mL/hr at 02/03/23 0010   heparin 1,600 Units/hr (02/03/23 0042)     Objective: Vitals:   02/03/23 0010 02/03/23 0500 02/03/23 0539 02/03/23 0839  BP: 122/86 126/77 126/77 132/77  Pulse: 88 85 85 84  Resp: 17 17 17 18   Temp: 99.1 F (37.3 C) 98.5 F (36.9 C) 98.5 F (36.9 C) 98.1 F (36.7 C)  TempSrc: Oral Oral Oral Oral  SpO2: 96% 96% 96% 94%  Weight:      Height:        Intake/Output Summary (Last 24 hours) at 02/03/2023 5784 Last data filed at 02/03/2023 0300 Gross per 24 hour  Intake 584.05 ml  Output --  Net 584.05 ml   Filed Weights   02/02/23 1120  Weight: 92.3 kg    Examination:  General: A/O x 4, No acute respiratory distress Eyes: negative scleral hemorrhage, negative anisocoria, negative icterus ENT: Negative Runny nose, negative gingival bleeding, Neck:  Negative scars, masses, torticollis, lymphadenopathy, JVD Lungs: Clear to auscultation bilaterally without wheezes or crackles Cardiovascular: Regular rate and rhythm without murmur gallop or rub normal S1 and S2 Abdomen: OBESE, negative abdominal pain, nondistended, positive soft, bowel sounds, no rebound, no ascites, no appreciable mass Extremities: No significant cyanosis, clubbing, or edema bilateral lower extremities Skin: Negative rashes, lesions, ulcers Psychiatric:  Negative depression, negative anxiety, negative fatigue, negative mania  Central nervous system:  Cranial nerves II through XII intact, tongue/uvula midline, all extremities muscle strength 5/5, sensation intact throughout,negative dysarthria, negative expressive aphasia, negative receptive aphasia.  .     Data Reviewed: Care during the described time interval was provided by me .  I have reviewed this patient's available data, including medical history, events of note, physical examination, and all  test results as part of my evaluation.  CBC: Recent Labs  Lab 02/02/23 1135 02/02/23 1325 02/03/23 0529  WBC 1.5* 1.4* 1.3*  NEUTROABS  --  0.3* 0.2*  HGB 10.8* 10.6* 9.1*  HCT 30.2* 30.0* 26.9*  MCV 110.2* 111.5* 115.9*  PLT 41* 42* 35*   Basic Metabolic Panel: Recent Labs  Lab 02/02/23 1135 02/02/23 2255 02/03/23 0529  NA 136  --  136  K 4.4  --  3.6  CL 103  --  103  CO2 23  --  26  GLUCOSE 101*  --  98  BUN 8  --  9  CREATININE 0.57*  --  0.60*  CALCIUM 9.5  --  8.7*  MG  --  2.1 2.0  PHOS  --  3.3 3.9   GFR: Estimated Creatinine Clearance: 89.5 mL/min (A) (by C-G formula based  on SCr of 0.6 mg/dL (L)). Liver Function Tests: Recent Labs  Lab 02/02/23 1135 02/03/23 0529  AST 16 16  ALT 14 16  ALKPHOS 69 57  BILITOT 0.6 0.9  PROT 6.7 6.0*  ALBUMIN 4.1 3.2*   No results for input(s): "LIPASE", "AMYLASE" in the last 168 hours. No results for input(s): "AMMONIA" in the last 168 hours. Coagulation Profile: Recent Labs  Lab 02/02/23 1248  INR 1.0   Cardiac Enzymes: Recent Labs  Lab 02/02/23 2255  CKTOTAL 54   BNP (last 3 results) No results for input(s): "PROBNP" in the last 8760 hours. HbA1C: No results for input(s): "HGBA1C" in the last 72 hours. CBG: No results for input(s): "GLUCAP" in the last 168 hours. Lipid Profile: No results for input(s): "CHOL", "HDL", "LDLCALC", "TRIG", "CHOLHDL", "LDLDIRECT" in the last 72 hours. Thyroid Function Tests: Recent Labs    02/02/23 2255  TSH 2.035   Anemia Panel: Recent Labs    02/02/23 2255  VITAMINB12 383  FOLATE 31.3  FERRITIN 405*  TIBC 298  IRON 191*  RETICCTPCT 1.5   Sepsis Labs: Recent Labs  Lab 02/03/23 0024 02/03/23 0529  LATICACIDVEN 1.2 0.8    No results found for this or any previous visit (from the past 240 hour(s)).       Radiology Studies: US Abdomen Limited RUQ (LIVER/GB)  Result Date: 02/03/2023 CLINICAL DATA:  100250 Liver disease 100250 EXAM: ULTRASOUND  ABDOMEN LIMITED RIGHT UPPER QUADRANT COMPARISON:  10/11/2018 FINDINGS: Gallbladder: No gallstones, pericholecystic fluid or wall thickening visualized. Common bile duct: Diameter: 0.4 cm. Liver: Liver is hyperechoic consistent with fatty infiltration. No focal hepatic lesions identified. No intrahepatic ductal dilatation. Hepatopetal portal vein. IMPRESSION: Hepatic fatty infiltration. Otherwise unremarkable examination of the right upper quadrant. Electronically Signed   By: Sammie Bench M.D.   On: 02/03/2023 06:35   CT Angio Chest Pulmonary Embolism (PE) W or WO Contrast  Result Date: 02/03/2023 CLINICAL DATA:  Pulmonary embolism suspected, high probability. Shortness of breath getting worse for months. Left arm swelling. Left subclavian stent. History of lung cancer and partial left lung removal. COPD. EXAM: CT ANGIOGRAPHY CHEST WITH CONTRAST TECHNIQUE: Multidetector CT imaging of the chest was performed using the standard protocol during bolus administration of intravenous contrast. Multiplanar CT image reconstructions and MIPs were obtained to evaluate the vascular anatomy. RADIATION DOSE REDUCTION: This exam was performed according to the departmental dose-optimization program which includes automated exposure control, adjustment of the mA and/or kV according to patient size and/or use of iterative reconstruction technique. CONTRAST:  54mL OMNIPAQUE IOHEXOL 350 MG/ML SOLN COMPARISON:  09/20/2022. FINDINGS: Cardiovascular: The heart is normal in size and there is no pericardial effusion. Multi-vessel coronary artery calcifications are noted. There is atherosclerotic calcification of the aorta without evidence of aneurysm. The pulmonary trunk is normal in caliber. No pulmonary artery filling defect is identified. Examination is limited due to respiratory motion and mixing artifact. Left subclavian stent is in place, however patency can not be determined due to timing of contrast bolus. Mediastinum/Nodes:  No mediastinal, hilar, or axillary lymphadenopathy. The thyroid gland, trachea, and esophagus are within normal limits. Lungs/Pleura: Paraseptal and centrilobular emphysematous changes are present in the lungs. Postsurgical changes are noted at the left hilum with reduced lung volume on the left. A stable cystic lesion is noted in the perihilar region of the left lower lobe. No effusion or pneumothorax. A 3 mm nodule is present in the right middle lobe, unchanged. Upper Abdomen: No acute abnormality. Musculoskeletal: The  bony structures are stable. No acute or suspicious osseous abnormality. Review of the MIP images confirms the above findings. IMPRESSION: 1. No evidence of pulmonary embolism or other acute process. 2. Stable postsurgical changes in the left upper lobe. 3. Emphysema. 4. Aortic atherosclerosis and coronary artery calcifications. Electronically Signed   By: Brett Fairy M.D.   On: 02/03/2023 00:22   US Venous Img Upper Uni Left  Result Date: 02/02/2023 CLINICAL DATA:  Left upper extremity swelling for 3-4 days. EXAM: LEFT UPPER EXTREMITY VENOUS DOPPLER ULTRASOUND TECHNIQUE: Gray-scale sonography with graded compression, as well as color Doppler and duplex ultrasound were performed to evaluate the upper extremity deep venous system from the level of the subclavian vein and including the jugular, axillary, basilic, radial, ulnar and upper cephalic vein. Spectral Doppler was utilized to evaluate flow at rest and with distal augmentation maneuvers. COMPARISON:  None Available. FINDINGS: Contralateral Subclavian Vein: Respiratory phasicity is normal and symmetric with the symptomatic side. No evidence of thrombus. Normal compressibility. Internal Jugular Vein: No evidence of thrombus. Normal compressibility, respiratory phasicity and response to augmentation. Subclavian Vein: Thrombus identified within the left subclavian. Axillary Vein: No evidence of thrombus. Normal compressibility, respiratory  phasicity and response to augmentation. Cephalic Vein: No evidence of thrombus. Normal compressibility, respiratory phasicity and response to augmentation. Basilic Vein: No evidence of thrombus. Normal compressibility, respiratory phasicity and response to augmentation. Brachial Veins: No evidence of thrombus. Normal compressibility, respiratory phasicity and response to augmentation. Radial Veins: No evidence of thrombus. Normal compressibility, respiratory phasicity and response to augmentation. Ulnar Veins: No evidence of thrombus. Normal compressibility, respiratory phasicity and response to augmentation. Venous Reflux:  None visualized. Other Findings:  None visualized. IMPRESSION: Exam positive for thrombus within the left subclavian vein. These results were called by telephone at the time of interpretation on 02/02/2023 at 1:37 pm to provider [facility] , who verbally acknowledged these results. Electronically Signed   By: Kerby Moors M.D.   On: 02/02/2023 13:39   DG Chest 2 View  Result Date: 02/02/2023 CLINICAL DATA:  Shortness of breath. EXAM: CHEST - 2 VIEW COMPARISON:  January 03, 2018. FINDINGS: The heart size and mediastinal contours are within normal limits. Old left rib fractures are noted. Minimal right perihilar subsegmental atelectasis or scarring is noted. Left lung is unremarkable. IMPRESSION: Minimal right perihilar subsegmental atelectasis or scarring. Electronically Signed   By: Marijo Conception M.D.   On: 02/02/2023 12:05        Scheduled Meds:  carvedilol  3.125 mg Oral BID WC   folic acid  1 mg Oral Daily   latanoprost  1 drop Both Eyes QHS   multivitamin with minerals  1 tablet Oral Daily   rosuvastatin  5 mg Oral Daily   sodium chloride (PF)       thiamine  100 mg Oral Daily   Or   thiamine  100 mg Intravenous Daily   Continuous Infusions:  sodium chloride 100 mL/hr at 02/03/23 0010   heparin 1,600 Units/hr (02/03/23 0042)     LOS: 1 day    Time  spent:40 min   Gerrard Crystal, Geraldo Docker, MD [facility]   If 7PM-7AM, please contact night-coverage 02/03/2023, 9:16 AM

## 2023-02-03 NOTE — Progress Notes (Incomplete)
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 Patient currently on heparin drip. IR consult in a.m. May need to coordinate if patient needs platelets administration prior to proceeding Given progressive shortness of breath we will obtain CTA to rule out PE   Hyperlipidemia Continue Crestor 5 mg p.o. daily   COPD GOLD II  Chronic stable continue home medications   OSA (obstructive sleep apnea) Noncompliant with CPAP   Alcohol abuse Order CIWA protocol pancytopenia could be related to alcohol abuse.  Evaluate for B12 and thiamine and folate deficiency   Malignant neoplasm of lower lobe, unspecified bronchus or lung [name]) Oncology aware the patient is being admitted   Pancytopenia [name])  discussed with oncology on-call. Suspect secondary to alcohol abuse. Pathology smear pending. Evaluate for dietary deficiencies Oncology will see on-call in the morning Obtain right upper quadrant ultrasound to evaluate liver for cirrhosis             Body mass index is 30.05 kg/m.  *** Mobility Assessment (last 72 hours)     Mobility Assessment     Row Name 02/02/23 2033           Does patient have an order for bedrest or is patient medically unstable No - Continue assessment       What is the highest level of mobility based on the progressive mobility assessment? Level 6 (Walks independently in room and hall) - Balance while walking in room without assist - Complete                      DVT  prophylaxis: *** Code Status: *** Family Communication: *** Status is: Inpatient  {Inpatient:23812}  Dispo: The patient is from: {From:23814}              Anticipated d/c is to: {To:23815}              Anticipated d/c date is: {Days:23816}              Patient currently {Medically stable:23817}      Consultants:  ***  Procedures/Significant Events:  ***  I have personally reviewed and interpreted all radiology studies and my findings are as above.  VENTILATOR SETTINGS: ***   Cultures ***  Antimicrobials: ***   Devices ***   LINES / TUBES:  ***    Continuous Infusions:  sodium chloride 100 mL/hr at 02/03/23 0010   heparin 1,600 Units/hr (02/03/23 0042)     Objective: Vitals:   02/02/23 2039 02/03/23 0010 02/03/23 0500 02/03/23 0539  BP: (!) 162/72 122/86 126/77 126/77  Pulse: 89 88 85 85  Resp: 18 17 17 17   Temp: 99.3 F (37.4 C) 99.1 F (37.3 C) 98.5 F (36.9 C) 98.5 F (36.9 C)  TempSrc: Oral Oral Oral Oral  SpO2: 100% 96% 96% 96%  Weight:      Height:        Intake/Output Summary (Last 24 hours) at 02/03/2023 0745 Last data filed  at 02/03/2023 0300 Gross per 24 hour  Intake 584.05 ml  Output --  Net 584.05 ml   Filed Weights   02/02/23 1120  Weight: 92.3 kg    Examination:  General: No acute respiratory distress Eyes: negative scleral hemorrhage, negative anisocoria, negative icterus*** ENT: Negative Runny nose, negative gingival bleeding,*** Neck:  Negative scars, masses, torticollis, lymphadenopathy, JVD*** Lungs: Clear to auscultation bilaterally without wheezes or crackles Cardiovascular: Regular rate and rhythm without murmur gallop or rub normal S1 and S2 Abdomen: negative abdominal pain, nondistended, positive soft, bowel sounds, no rebound, no ascites, no appreciable mass Extremities: No significant cyanosis, clubbing, or edema bilateral lower extremities Skin: Negative rashes, lesions, ulcers*** Psychiatric:  Negative  depression, negative anxiety, negative fatigue, negative mania *** Central nervous system:  Cranial nerves II through XII intact, tongue/uvula midline, all extremities muscle strength 5/5, sensation intact throughout, finger nose finger bilateral within normal limits, quick finger touch bilateral within normal limits, negative Romberg sign, heel to shin bilateral within normal limits, standing on 1 foot bilateral within normal limits, walking on tiptoes within normal limits, walking on heels within normal limits, negative dysarthria, negative expressive aphasia, negative receptive aphasia.***  .     Data Reviewed: Care during the described time interval was provided by me .  I have reviewed this patient's available data, including medical history, events of note, physical examination, and all test results as part of my evaluation.  CBC: Recent Labs  Lab 02/02/23 1135 02/02/23 1325 02/03/23 0529  WBC 1.5* 1.4* 1.3*  NEUTROABS  --  0.3* 0.2*  HGB 10.8* 10.6* 9.1*  HCT 30.2* 30.0* 26.9*  MCV 110.2* 111.5* 115.9*  PLT 41* 42* 35*   Basic Metabolic Panel: Recent Labs  Lab 02/02/23 1135 02/02/23 2255 02/03/23 0529  NA 136  --  136  K 4.4  --  3.6  CL 103  --  103  CO2 23  --  26  GLUCOSE 101*  --  98  BUN 8  --  9  CREATININE 0.57*  --  0.60*  CALCIUM 9.5  --  8.7*  MG  --  2.1 2.0  PHOS  --  3.3 3.9   GFR: Estimated Creatinine Clearance: 89.5 mL/min (A) (by C-G formula based on SCr of 0.6 mg/dL (L)). Liver Function Tests: Recent Labs  Lab 02/02/23 1135 02/03/23 0529  AST 16 16  ALT 14 16  ALKPHOS 69 57  BILITOT 0.6 0.9  PROT 6.7 6.0*  ALBUMIN 4.1 3.2*   No results for input(s): "LIPASE", "AMYLASE" in the last 168 hours. No results for input(s): "AMMONIA" in the last 168 hours. Coagulation Profile: Recent Labs  Lab 02/02/23 1248  INR 1.0   Cardiac Enzymes: Recent Labs  Lab 02/02/23 2255  CKTOTAL 54   BNP (last 3 results) No results for input(s): "PROBNP"  in the last 8760 hours. HbA1C: No results for input(s): "HGBA1C" in the last 72 hours. CBG: No results for input(s): "GLUCAP" in the last 168 hours. Lipid Profile: No results for input(s): "CHOL", "HDL", "LDLCALC", "TRIG", "CHOLHDL", "LDLDIRECT" in the last 72 hours. Thyroid Function Tests: Recent Labs    02/02/23 2255  TSH 2.035   Anemia Panel: Recent Labs    02/02/23 2255  VITAMINB12 383  FOLATE 31.3  FERRITIN 405*  TIBC 298  IRON 191*  RETICCTPCT 1.5   Sepsis Labs: Recent Labs  Lab 02/03/23 0024 02/03/23 0529  LATICACIDVEN 1.2 0.8    No results found for this or any previous  visit (from the past 240 hour(s)).       Radiology Studies: US Abdomen Limited RUQ (LIVER/GB)  Result Date: 02/03/2023 CLINICAL DATA:  100250 Liver disease 100250 EXAM: ULTRASOUND ABDOMEN LIMITED RIGHT UPPER QUADRANT COMPARISON:  10/11/2018 FINDINGS: Gallbladder: No gallstones, pericholecystic fluid or wall thickening visualized. Common bile duct: Diameter: 0.4 cm. Liver: Liver is hyperechoic consistent with fatty infiltration. No focal hepatic lesions identified. No intrahepatic ductal dilatation. Hepatopetal portal vein. IMPRESSION: Hepatic fatty infiltration. Otherwise unremarkable examination of the right upper quadrant. Electronically Signed   By: Sammie Bench M.D.   On: 02/03/2023 06:35   CT Angio Chest Pulmonary Embolism (PE) W or WO Contrast  Result Date: 02/03/2023 CLINICAL DATA:  Pulmonary embolism suspected, high probability. Shortness of breath getting worse for months. Left arm swelling. Left subclavian stent. History of lung cancer and partial left lung removal. COPD. EXAM: CT ANGIOGRAPHY CHEST WITH CONTRAST TECHNIQUE: Multidetector CT imaging of the chest was performed using the standard protocol during bolus administration of intravenous contrast. Multiplanar CT image reconstructions and MIPs were obtained to evaluate the vascular anatomy. RADIATION DOSE REDUCTION: This exam was  performed according to the departmental dose-optimization program which includes automated exposure control, adjustment of the mA and/or kV according to patient size and/or use of iterative reconstruction technique. CONTRAST:  33mL OMNIPAQUE IOHEXOL 350 MG/ML SOLN COMPARISON:  09/20/2022. FINDINGS: Cardiovascular: The heart is normal in size and there is no pericardial effusion. Multi-vessel coronary artery calcifications are noted. There is atherosclerotic calcification of the aorta without evidence of aneurysm. The pulmonary trunk is normal in caliber. No pulmonary artery filling defect is identified. Examination is limited due to respiratory motion and mixing artifact. Left subclavian stent is in place, however patency can not be determined due to timing of contrast bolus. Mediastinum/Nodes: No mediastinal, hilar, or axillary lymphadenopathy. The thyroid gland, trachea, and esophagus are within normal limits. Lungs/Pleura: Paraseptal and centrilobular emphysematous changes are present in the lungs. Postsurgical changes are noted at the left hilum with reduced lung volume on the left. A stable cystic lesion is noted in the perihilar region of the left lower lobe. No effusion or pneumothorax. A 3 mm nodule is present in the right middle lobe, unchanged. Upper Abdomen: No acute abnormality. Musculoskeletal: The bony structures are stable. No acute or suspicious osseous abnormality. Review of the MIP images confirms the above findings. IMPRESSION: 1. No evidence of pulmonary embolism or other acute process. 2. Stable postsurgical changes in the left upper lobe. 3. Emphysema. 4. Aortic atherosclerosis and coronary artery calcifications. Electronically Signed   By: Brett Fairy M.D.   On: 02/03/2023 00:22   US Venous Img Upper Uni Left  Result Date: 02/02/2023 CLINICAL DATA:  Left upper extremity swelling for 3-4 days. EXAM: LEFT UPPER EXTREMITY VENOUS DOPPLER ULTRASOUND TECHNIQUE: Gray-scale sonography with  graded compression, as well as color Doppler and duplex ultrasound were performed to evaluate the upper extremity deep venous system from the level of the subclavian vein and including the jugular, axillary, basilic, radial, ulnar and upper cephalic vein. Spectral Doppler was utilized to evaluate flow at rest and with distal augmentation maneuvers. COMPARISON:  None Available. FINDINGS: Contralateral Subclavian Vein: Respiratory phasicity is normal and symmetric with the symptomatic side. No evidence of thrombus. Normal compressibility. Internal Jugular Vein: No evidence of thrombus. Normal compressibility, respiratory phasicity and response to augmentation. Subclavian Vein: Thrombus identified within the left subclavian. Axillary Vein: No evidence of thrombus. Normal compressibility, respiratory phasicity and response to augmentation. Cephalic Vein: No  evidence of thrombus. Normal compressibility, respiratory phasicity and response to augmentation. Basilic Vein: No evidence of thrombus. Normal compressibility, respiratory phasicity and response to augmentation. Brachial Veins: No evidence of thrombus. Normal compressibility, respiratory phasicity and response to augmentation. Radial Veins: No evidence of thrombus. Normal compressibility, respiratory phasicity and response to augmentation. Ulnar Veins: No evidence of thrombus. Normal compressibility, respiratory phasicity and response to augmentation. Venous Reflux:  None visualized. Other Findings:  None visualized. IMPRESSION: Exam positive for thrombus within the left subclavian vein. These results were called by telephone at the time of interpretation on 02/02/2023 at 1:37 pm to provider [facility] , who verbally acknowledged these results. Electronically Signed   By: Kerby Moors M.D.   On: 02/02/2023 13:39   DG Chest 2 View  Result Date: 02/02/2023 CLINICAL DATA:  Shortness of breath. EXAM: CHEST - 2 VIEW COMPARISON:  January 03, 2018. FINDINGS:  The heart size and mediastinal contours are within normal limits. Old left rib fractures are noted. Minimal right perihilar subsegmental atelectasis or scarring is noted. Left lung is unremarkable. IMPRESSION: Minimal right perihilar subsegmental atelectasis or scarring. Electronically Signed   By: Marijo Conception M.D.   On: 02/02/2023 12:05        Scheduled Meds:  carvedilol  3.125 mg Oral BID WC   folic acid  1 mg Oral Daily   latanoprost  1 drop Both Eyes QHS   multivitamin with minerals  1 tablet Oral Daily   rosuvastatin  5 mg Oral Daily   sodium chloride (PF)       thiamine  100 mg Oral Daily   Or   thiamine  100 mg Intravenous Daily   Continuous Infusions:  sodium chloride 100 mL/hr at 02/03/23 0010   heparin 1,600 Units/hr (02/03/23 0042)     LOS: 1 day    Time spent:40 min***    Jailine Lieder, Geraldo Docker, MD [facility]   If 7PM-7AM, please contact night-coverage 02/03/2023, 7:45 AM

## 2023-02-03 NOTE — Consult Note (Signed)
Chief Complaint: Patient was seen in consultation today for  Chief Complaint  Patient presents with   Arm Swelling    Referring Physician(s): Dr. Roel Cluck  Supervising Physician: Ruthann Cancer  Patient Status: [facility] - In-pt  History of Present Illness: [name] is a 77 y.o. 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 He is familiar to IR from prior treatments including thrombolysis and stent placement in 2007. He remains on anticoagulation.   He presented to the ED 02/02/23 with complaints of left arm swelling. He was admitted for acute thrombosis of the left subclavian vein and worsening pancytopenia.   Interventional Radiology has been asked to evaluate this patient for an image-guided left upper extremity venogram with possible intervention and an image-guided bone marrow biopsy with aspiration. Imaging reviewed by Dr. Serafina Royals.     Past Medical History:  Diagnosis Date   Adenocarcinoma of right lung, stage 1 ([place]) 03/03/2017   Cancer ([place])    lung cancer 2005   Cancer [facility])    skin cancer, mouth cancer   COPD (chronic obstructive pulmonary disease) (HCC)    Glaucoma    Hereditary and idiopathic peripheral neuropathy 09/26/2014   Hyperlipidemia    Hypertension    Ischemic colitis ([place])    Left subclavian vein thrombosis (HCC)    Polio    Sleep apnea    no CPAP    Past Surgical History:  Procedure Laterality Date   HERNIA REPAIR     left ingunal   LUNG REMOVAL, PARTIAL Left    SEGMENTECOMY Right 01/12/2017   Procedure: RIGHT SUPERIOR SEGMENTECTOMY;  Surgeon: Melrose Nakayama, MD;  Location: [place];  Service: Thoracic;  Laterality: Right;   SUBCLAVIAN STENT PLACEMENT     TONSILLECTOMY     VIDEO ASSISTED THORACOSCOPY Right 01/12/2017   Procedure: VIDEO ASSISTED  THORACOSCOPY;  Surgeon: Melrose Nakayama, MD;  Location: [place];  Service: Thoracic;  Laterality: Right;    Allergies: Patient has no known allergies.  Medications: Prior to Admission medications   Medication Sig Start Date End Date Taking? Authorizing Provider  albuterol (VENTOLIN HFA) 108 (90 Base) MCG/ACT inhaler Inhale 1-2 puffs into the lungs every 4 (four) hours as needed for wheezing or shortness of breath.   Yes [provider]  Bacillus Coagulans-Inulin (PROBIOTIC) 1-250 BILLION-MG CAPS Take 1 capsule by mouth daily.   Yes [provider]  Brimonidine Tartrate (LUMIFY) 0.025 % SOLN Place 1 drop into both eyes daily as needed (redness).   Yes [provider]  Calcium-Phosphorus-Vitamin D (CALCIUM/D3 ADULT GUMMIES PO) Take 1 tablet by mouth 2 (two) times daily. Gummy has 500 mg of calcium, D3 1000 units, zinc 7 mg   Yes [provider]  Carboxymethylcellulose Sodium (REFRESH LIQUIGEL) 1 % GEL Place 1 drop into both eyes 2 (two) times daily as needed (dryness).   Yes [provider]  carvedilol (COREG) 3.125 MG tablet Take 3.125 mg by mouth 2 (two) times daily with a meal.   Yes [provider]  ELIQUIS 2.5 MG TABS tablet Take 2.5 mg by mouth 2 (two) times daily. 06/09/22  Yes [provider]  latanoprost (XALATAN) 0.005 % ophthalmic solution Place 1 drop into both eyes at bedtime.   Yes [provider]  loperamide (IMODIUM A-D) 2 MG tablet Take 2 mg  by mouth as needed for diarrhea or loose stools.   Yes [provider]  losartan (COZAAR) 50 MG tablet Take 50 mg by mouth daily. 04/25/21  Yes [provider]  Multiple Vitamin (MULTIVITAMIN WITH MINERALS) TABS tablet Take 1 tablet by mouth 2 (two) times daily.   Yes [provider]  rosuvastatin (CRESTOR) 10 MG tablet Take 10 mg by mouth daily. 07/10/20  Yes [provider]  SPIRIVA HANDIHALER 18 MCG inhalation capsule Place 18 mcg into  inhaler and inhale daily. 08/26/20  Yes [provider]     Family History  Problem Relation Age of Onset   Cancer Mother        intestine   Hyperlipidemia Mother    Heart Problems Brother    Heart attack Brother 13       s/p CABG and redo 5 years later   Heart attack Father    Heart disease Father    Colon cancer Maternal Grandmother    Colon cancer Maternal Aunt     Social History   Socioeconomic History   Marital status: Married    Spouse name: Not on file   Number of children: 1   Years of education: BS   Highest education level: Not on file  Occupational History   Occupation: Retired    Fish farm manager: RETIRED   Occupation: Scientist, research (physical sciences)  Tobacco Use   Smoking status: Former    Packs/day: 2.00    Years: 40.00    Total pack years: 80.00    Types: Cigarettes    Quit date: 2004    Years since quitting: 20.1   Smokeless tobacco: Never  Vaping Use   Vaping Use: Never used  Substance and Sexual Activity   Alcohol use: Yes    Alcohol/week: 42.0 standard drinks of alcohol    Types: 42 Glasses of wine per week    Comment: night- Vodka 4 to five drinks a night   Drug use: No   Sexual activity: Not on file  Other Topics Concern   Not on file  Social History Narrative   Not on file   Social Determinants of Health   Financial Resource Strain: Not on file  Food Insecurity: No Food Insecurity (02/02/2023)   Hunger Vital Sign    Worried About Running Out of Food in the Last Year: Never true    Ran Out of Food in the Last Year: Never true  Transportation Needs: No Transportation Needs (02/02/2023)   PRAPARE - Hydrologist (Medical): No    Lack of Transportation (Non-Medical): No  Physical Activity: Not on file  Stress: Not on file  Social Connections: Not on file    Review of Systems: A 12 point ROS discussed and pertinent positives are indicated in the HPI above.  All other systems are negative.  Review of Systems  Constitutional:   Positive for fatigue. Negative for appetite change.  Respiratory:  Positive for shortness of breath. Negative for cough.   Cardiovascular:  Negative for chest pain and leg swelling.       Left arm swelling  Gastrointestinal:  Negative for abdominal pain, diarrhea, nausea and vomiting.  Musculoskeletal:  Negative for back pain.  Neurological:  Negative for dizziness and headaches.    Vital Signs: BP 132/77 (BP Location: Right Arm)   Pulse 84   Temp 98.1 F (36.7 C) (Oral)   Resp 18   Ht 5\' 9"  (1.753 m)   Wt 203 lb 7.8 oz (  92.3 kg)   SpO2 94%   BMI 30.05 kg/m   Physical Exam Constitutional:      General: He is not in acute distress.    Appearance: He is not ill-appearing.  HENT:     Mouth/Throat:     Mouth: Mucous membranes are moist.     Pharynx: Oropharynx is clear.  Cardiovascular:     Rate and Rhythm: Normal rate and regular rhythm.     Pulses: Normal pulses.  Pulmonary:     Effort: Pulmonary effort is normal.  Abdominal:     Tenderness: There is no abdominal tenderness.  Musculoskeletal:        General: Swelling present.     Comments: Left upper extremity   Skin:    General: Skin is warm and dry.  Neurological:     Mental Status: He is alert and oriented to person, place, and time.  Psychiatric:        Mood and Affect: Mood normal.        Behavior: Behavior normal.        Thought Content: Thought content normal.        Judgment: Judgment normal.     Imaging: US Abdomen Limited RUQ (LIVER/GB)  Result Date: 02/03/2023 CLINICAL DATA:  100250 Liver disease 100250 EXAM: ULTRASOUND ABDOMEN LIMITED RIGHT UPPER QUADRANT COMPARISON:  10/11/2018 FINDINGS: Gallbladder: No gallstones, pericholecystic fluid or wall thickening visualized. Common bile duct: Diameter: 0.4 cm. Liver: Liver is hyperechoic consistent with fatty infiltration. No focal hepatic lesions identified. No intrahepatic ductal dilatation. Hepatopetal portal vein. IMPRESSION: Hepatic fatty infiltration.  Otherwise unremarkable examination of the right upper quadrant. Electronically Signed   By: Sammie Bench M.D.   On: 02/03/2023 06:35   CT Angio Chest Pulmonary Embolism (PE) W or WO Contrast  Result Date: 02/03/2023 CLINICAL DATA:  Pulmonary embolism suspected, high probability. Shortness of breath getting worse for months. Left arm swelling. Left subclavian stent. History of lung cancer and partial left lung removal. COPD. EXAM: CT ANGIOGRAPHY CHEST WITH CONTRAST TECHNIQUE: Multidetector CT imaging of the chest was performed using the standard protocol during bolus administration of intravenous contrast. Multiplanar CT image reconstructions and MIPs were obtained to evaluate the vascular anatomy. RADIATION DOSE REDUCTION: This exam was performed according to the departmental dose-optimization program which includes automated exposure control, adjustment of the mA and/or kV according to patient size and/or use of iterative reconstruction technique. CONTRAST:  56mL OMNIPAQUE IOHEXOL 350 MG/ML SOLN COMPARISON:  09/20/2022. FINDINGS: Cardiovascular: The heart is normal in size and there is no pericardial effusion. Multi-vessel coronary artery calcifications are noted. There is atherosclerotic calcification of the aorta without evidence of aneurysm. The pulmonary trunk is normal in caliber. No pulmonary artery filling defect is identified. Examination is limited due to respiratory motion and mixing artifact. Left subclavian stent is in place, however patency can not be determined due to timing of contrast bolus. Mediastinum/Nodes: No mediastinal, hilar, or axillary lymphadenopathy. The thyroid gland, trachea, and esophagus are within normal limits. Lungs/Pleura: Paraseptal and centrilobular emphysematous changes are present in the lungs. Postsurgical changes are noted at the left hilum with reduced lung volume on the left. A stable cystic lesion is noted in the perihilar region of the left lower lobe. No  effusion or pneumothorax. A 3 mm nodule is present in the right middle lobe, unchanged. Upper Abdomen: No acute abnormality. Musculoskeletal: The bony structures are stable. No acute or suspicious osseous abnormality. Review of the MIP images confirms the above findings. IMPRESSION: 1.  No evidence of pulmonary embolism or other acute process. 2. Stable postsurgical changes in the left upper lobe. 3. Emphysema. 4. Aortic atherosclerosis and coronary artery calcifications. Electronically Signed   By: Brett Fairy M.D.   On: 02/03/2023 00:22   US Venous Img Upper Uni Left  Result Date: 02/02/2023 CLINICAL DATA:  Left upper extremity swelling for 3-4 days. EXAM: LEFT UPPER EXTREMITY VENOUS DOPPLER ULTRASOUND TECHNIQUE: Gray-scale sonography with graded compression, as well as color Doppler and duplex ultrasound were performed to evaluate the upper extremity deep venous system from the level of the subclavian vein and including the jugular, axillary, basilic, radial, ulnar and upper cephalic vein. Spectral Doppler was utilized to evaluate flow at rest and with distal augmentation maneuvers. COMPARISON:  None Available. FINDINGS: Contralateral Subclavian Vein: Respiratory phasicity is normal and symmetric with the symptomatic side. No evidence of thrombus. Normal compressibility. Internal Jugular Vein: No evidence of thrombus. Normal compressibility, respiratory phasicity and response to augmentation. Subclavian Vein: Thrombus identified within the left subclavian. Axillary Vein: No evidence of thrombus. Normal compressibility, respiratory phasicity and response to augmentation. Cephalic Vein: No evidence of thrombus. Normal compressibility, respiratory phasicity and response to augmentation. Basilic Vein: No evidence of thrombus. Normal compressibility, respiratory phasicity and response to augmentation. Brachial Veins: No evidence of thrombus. Normal compressibility, respiratory phasicity and response to  augmentation. Radial Veins: No evidence of thrombus. Normal compressibility, respiratory phasicity and response to augmentation. Ulnar Veins: No evidence of thrombus. Normal compressibility, respiratory phasicity and response to augmentation. Venous Reflux:  None visualized. Other Findings:  None visualized. IMPRESSION: Exam positive for thrombus within the left subclavian vein. These results were called by telephone at the time of interpretation on 02/02/2023 at 1:37 pm to provider [facility] , who verbally acknowledged these results. Electronically Signed   By: Kerby Moors M.D.   On: 02/02/2023 13:39   DG Chest 2 View  Result Date: 02/02/2023 CLINICAL DATA:  Shortness of breath. EXAM: CHEST - 2 VIEW COMPARISON:  January 03, 2018. FINDINGS: The heart size and mediastinal contours are within normal limits. Old left rib fractures are noted. Minimal right perihilar subsegmental atelectasis or scarring is noted. Left lung is unremarkable. IMPRESSION: Minimal right perihilar subsegmental atelectasis or scarring. Electronically Signed   By: Marijo Conception M.D.   On: 02/02/2023 12:05    Labs:  CBC: Recent Labs    09/20/22 1524 02/02/23 1135 02/02/23 1325 02/03/23 0529  WBC 4.7 1.5* 1.4* 1.3*  HGB 13.9 10.8* 10.6* 9.1*  HCT 39.2 30.2* 30.0* 26.9*  PLT 158 41* 42* 35*    COAGS: Recent Labs    02/02/23 1248 02/02/23 2255 02/03/23 0733  INR 1.0  --   --   APTT  --  52* 86*    BMP: Recent Labs    09/20/22 1524 02/02/23 1135 02/03/23 0529  NA 137 136 136  K 4.2 4.4 3.6  CL 103 103 103  CO2 30 23 26   GLUCOSE 93 101* 98  BUN 7* 8 9  CALCIUM 9.0 9.5 8.7*  CREATININE 0.62 0.57* 0.60*  GFRNONAA >60 >60 >60    LIVER FUNCTION TESTS: Recent Labs    09/20/22 1524 02/02/23 1135 02/03/23 0529  BILITOT 0.4 0.6 0.9  AST 21 16 16   ALT 25 14 16   ALKPHOS 87 69 57  PROT 6.2* 6.7 6.0*  ALBUMIN 3.9 4.1 3.2*    TUMOR MARKERS: No results for input(s): "AFPTM", "CEA", "CA199",  "CHROMGRNA" in the last 8760 hours.  Assessment  and Plan:  Recurrent left subclavian thrombosis; pancytopenia: [name], 77 year old male, presents today to the [facility] department for an image-guided venogram. IR will assess the need for further interventions after this study.   The patient is tentatively scheduled 02/04/23 for an image-guided bone marrow biopsy with aspiration.   Risks and benefits of this procedure were discussed with the patient and/or patient's family including, but not limited to bleeding, infection, damage to adjacent structures or low yield requiring additional tests.  All of the questions were answered and there is agreement to proceed. He will be NPO at midnight.   Consent signed and in IR.  Thank you for this interesting consult.  I greatly enjoyed meeting [name] and look forward to participating in their care.  A copy of this report was sent to the requesting provider on this date.  Electronically Signed: Soyla Dryer, AGACNP-BC [phone] 02/03/2023, 10:57 AM   I spent a total of 20 Minutes    in face to face in clinical consultation, greater than 50% of which was counseling/coordinating care for left upper extremity venogram and bone marrow biopsy with aspiration.

## 2023-02-03 NOTE — Progress Notes (Signed)
ANTICOAGULATION CONSULT NOTE - Follow Up Consult  Pharmacy Consult for heparin Indication: hx left arm subclavian vein thrombosis and now acute left subclavian vein thrombus (PTA Eliquis on hold)  No Known Allergies   Patient Measurements: Height: 5\' 9"  (175.3 cm) Weight: 92.3 kg (203 lb 7.8 oz) IBW/kg (Calculated) : 70.7 Heparin Dosing Weight: 90 kg  Vital Signs: Temp: 98.1 F (36.7 C) (02/15 1616) Temp Source: Oral (02/15 1616) BP: 151/74 (02/15 1616) Pulse Rate: 82 (02/15 1616)  Labs: Recent Labs    02/02/23 1135 02/02/23 1248 02/02/23 1325 02/02/23 2255 02/03/23 0529 02/03/23 0733  HGB 10.8*  --  10.6*  --  9.1*  --   HCT 30.2*  --  30.0*  --  26.9*  --   PLT 41*  --  42*  --  35*  --   APTT  --   --   --  52*  --  86*  LABPROT  --  13.4  --   --   --   --   INR  --  1.0  --   --   --   --   HEPARINUNFRC  --   --   --  0.86*  --  0.71*  CREATININE 0.57*  --   --   --  0.60*  --   CKTOTAL  --   --   --  54  --   --      Estimated Creatinine Clearance: 89.5 mL/min (A) (by C-G formula based on SCr of 0.6 mg/dL (L)).   Medications:  - on Eliquis 2.5 mg bid (last dose taken on 02/02/23 at [facility]). Pt's wife reported that he is compliance with this medication and has not missed any doses at home.  Assessment: Patient's a 77 y.o with hx NSCLC and subclavian vein thrombosis in 2007 who presented to the ED on 02/02/26 with c/o left arm swelling.  Pt's wife reported that he was started on warfarin after the LUE DVT in 2007 and switched over to Eliquis by his PCP last year.  Left UE doppler on 02/02/23 showed "thrombus within the left subclavian vein."  He's currently on heparin drip for VTE treatment.  Today, 02/03/2023:  - heparin level is 0.71 , aPTT 86 secs --> heparin level and aPTT levels are not correlating at this time due to residual effect of Eliquis. Will continue to adjust heparin dose using aPTT - hgb 9.1, plts low (35K) and trending down - no bleeding documented     Goal of Therapy:  Heparin level 0.3-0.7 units/ml aPTT 66-102 seconds Monitor platelets by anticoagulation protocol: Yes   Plan:  - continue heparin drip at 1600 units/hr since aPTT is therapeutic - recheck aPTT and heparin levels at 4p to ensure levels remain at goal before changing to daily monitoring  - monitor for s/sx bleeding, watch plts closely   ADDENDUM Heparin drip was held for IR procedure.  Heparin resumed around 1800 Will retime aPTT and HL 8 hours after medication restart.  Per message with Dr. Serafina Royals, Heparin drip to be held on call to IR for bone marrow biopsy scheduled for 2/16   Royetta Asal, PharmD, BCPS 02/03/2023 6:16 PM

## 2023-02-03 NOTE — Progress Notes (Signed)

## 2023-02-03 NOTE — Progress Notes (Signed)
CHART NOTE I was informed with this patient's admission.  He is a 77 years old white male who was diagnosed with a stage Ia non-small cell lung cancer, adenocarcinoma in January 2018 status post right lower lobe superior segmentectomy with lymph node dissection and he has been in observation since that time.  I see the patient on an annual basis for his lung cancer and his last visit with me in October 2023 was unremarkable for any disease recurrence or progression.  His blood count at that time was fine. He was admitted to the hospital with pancytopenia and left arm subclavian vein thrombosis. Reviewing his recent blood work, this is a significant change from his previous blood work few months ago.  This could be secondary to his alcohol abuse but I am very concerned about possibility of underlying bone marrow abnormality like MDS,/leukemia.  I would strongly recommend proceeding with a bone marrow biopsy and aspirate to rule out any bone marrow abnormalities.  I will order the biopsy and if there is any concerning abnormalities I will see the patient in formal consultation for additional recommendation. Thank you for taking good care of Mr. Feig, please call if you have any questions.

## 2023-02-03 NOTE — Progress Notes (Signed)
ANTICOAGULATION CONSULT NOTE - Follow Up Consult  Pharmacy Consult for heparin Indication: hx left arm subclavian vein thrombosis and now acute left subclavian vein thrombus (PTA Eliquis on hold)  Allergies  Allergen Reactions   No Known Allergies     Patient Measurements: Height: 5\' 9"  (175.3 cm) Weight: 92.3 kg (203 lb 7.8 oz) IBW/kg (Calculated) : 70.7 Heparin Dosing Weight: 90 kg  Vital Signs: Temp: 98.5 F (36.9 C) (02/15 0539) Temp Source: Oral (02/15 0539) BP: 126/77 (02/15 0539) Pulse Rate: 85 (02/15 0539)  Labs: Recent Labs    02/02/23 1135 02/02/23 1248 02/02/23 1325 02/02/23 2255 02/03/23 0529 02/03/23 0733  HGB 10.8*  --  10.6*  --  9.1*  --   HCT 30.2*  --  30.0*  --  26.9*  --   PLT 41*  --  42*  --  35*  --   APTT  --   --   --  52*  --  86*  LABPROT  --  13.4  --   --   --   --   INR  --  1.0  --   --   --   --   HEPARINUNFRC  --   --   --  0.86*  --  0.71*  CREATININE 0.57*  --   --   --  0.60*  --   CKTOTAL  --   --   --  54  --   --     Estimated Creatinine Clearance: 89.5 mL/min (A) (by C-G formula based on SCr of 0.6 mg/dL (L)).   Medications:  - on Eliquis 2.5 mg bid (last dose taken on 02/02/23 at [place]). Pt's wife reported that he is compliance with this medication and has not missed any doses at home.  Assessment: Patient's a 77 y.o with hx NSCLC and subclavian vein thrombosis in 2007 who presented to the ED on 02/02/26 with c/o left arm swelling.  Pt's wife reported that he was started on warfarin after the LUE DVT in 2007 and switched over to Eliquis by his PCP last year.  Left UE doppler on 02/02/23 showed "thrombus within the left subclavian vein."  He's currently on heparin drip for VTE treatment.  Today, 02/03/2023:  - heparin level is 0.71 , aPTT 86 secs --> heparin level and aPTT levels are not correlating at this time due to residual effect of Eliquis. Will continue to adjust heparin dose using aPTT - hgb 9.1, plts low (35K) and  trending down - no bleeding documented    Goal of Therapy:  Heparin level 0.3-0.7 units/ml aPTT 66-102 seconds Monitor platelets by anticoagulation protocol: Yes   Plan:  - continue heparin drip at 1600 units/hr since aPTT is therapeutic - recheck aPTT and heparin levels at 4p to ensure levels remain at goal before changing to daily monitoring  - monitor for s/sx bleeding, watch plts closely   Astria Jordahl P 02/03/2023,8:35 AM

## 2023-02-03 NOTE — Progress Notes (Signed)
Discussed with Dr. Sherral Hammers use of midline with possible no blood return after placement. Dr. Sherral Hammers instructed placement of midline per his Midline order written. Also, discussed with nurse. Instructed nurse to removed AC PIV and use FA and midline as needed. Fran Lowes, RN VAST

## 2023-02-04 ENCOUNTER — Inpatient Hospital Stay (HOSPITAL_COMMUNITY): Payer: Medicare Other

## 2023-02-04 DIAGNOSIS — D61818 Other pancytopenia: Secondary | ICD-10-CM | POA: Diagnosis not present

## 2023-02-04 DIAGNOSIS — G4733 Obstructive sleep apnea (adult) (pediatric): Secondary | ICD-10-CM | POA: Diagnosis not present

## 2023-02-04 DIAGNOSIS — I82B12 Acute embolism and thrombosis of left subclavian vein: Secondary | ICD-10-CM | POA: Diagnosis not present

## 2023-02-04 DIAGNOSIS — F1090 Alcohol use, unspecified, uncomplicated: Secondary | ICD-10-CM | POA: Diagnosis not present

## 2023-02-04 LAB — MAGNESIUM: Magnesium: 2 mg/dL (ref 1.7–2.4)

## 2023-02-04 LAB — CBC WITH DIFFERENTIAL/PLATELET
Basophils Absolute: 0 10*3/uL (ref 0.0–0.1)
Basophils Relative: 0 %
Blasts: 4 %
Eosinophils Absolute: 0 10*3/uL (ref 0.0–0.5)
Eosinophils Relative: 0 %
HCT: 26.9 % — ABNORMAL LOW (ref 39.0–52.0)
Hemoglobin: 9.1 g/dL — ABNORMAL LOW (ref 13.0–17.0)
Immature Granulocytes: 0 %
Lymphocytes Relative: 80 %
Lymphs Abs: 1 10*3/uL (ref 0.7–4.0)
MCH: 39.2 pg — ABNORMAL HIGH (ref 26.0–34.0)
MCHC: 33.8 g/dL (ref 30.0–36.0)
MCV: 115.9 fL — ABNORMAL HIGH (ref 80.0–100.0)
Monocytes Absolute: 0.1 10*3/uL (ref 0.1–1.0)
Monocytes Relative: 9 %
Neutro Abs: 0.1 10*3/uL — CL (ref 1.7–7.7)
Neutrophils Relative %: 7 %
Platelets: 35 10*3/uL — ABNORMAL LOW (ref 150–400)
RBC: 2.32 MIL/uL — ABNORMAL LOW (ref 4.22–5.81)
RDW: 15.7 % — ABNORMAL HIGH (ref 11.5–15.5)
WBC: 1.3 10*3/uL — CL (ref 4.0–10.5)
nRBC: 1.6 % — ABNORMAL HIGH (ref 0.0–0.2)

## 2023-02-04 LAB — COMPREHENSIVE METABOLIC PANEL
ALT: 17 U/L (ref 0–44)
AST: 18 U/L (ref 15–41)
Albumin: 3.6 g/dL (ref 3.5–5.0)
Alkaline Phosphatase: 61 U/L (ref 38–126)
Anion gap: 9 (ref 5–15)
BUN: 9 mg/dL (ref 8–23)
CO2: 25 mmol/L (ref 22–32)
Calcium: 8.5 mg/dL — ABNORMAL LOW (ref 8.9–10.3)
Chloride: 101 mmol/L (ref 98–111)
Creatinine, Ser: 0.73 mg/dL (ref 0.61–1.24)
GFR, Estimated: >60 mL/min (ref >60)
Glucose, Bld: 103 mg/dL — ABNORMAL HIGH (ref 70–99)
Potassium: 3.6 mmol/L (ref 3.5–5.1)
Sodium: 135 mmol/L (ref 135–145)
Total Bilirubin: 0.8 mg/dL (ref 0.3–1.2)
Total Protein: 6.3 g/dL — ABNORMAL LOW (ref 6.5–8.1)

## 2023-02-04 LAB — PHOSPHORUS: Phosphorus: 3.4 mg/dL (ref 2.5–4.6)

## 2023-02-04 LAB — PATHOLOGIST SMEAR REVIEW

## 2023-02-04 LAB — HEPARIN LEVEL (UNFRACTIONATED): Heparin Unfractionated: 0.5 IU/mL (ref 0.30–0.70)

## 2023-02-04 LAB — APTT: aPTT: 87 seconds — ABNORMAL HIGH (ref 24–36)

## 2023-02-04 LAB — SURGICAL PATHOLOGY

## 2023-02-04 MED ORDER — FENTANYL CITRATE (PF) 100 MCG/2ML IJ SOLN
INTRAMUSCULAR | Status: AC
  Filled 2023-02-04: qty 2

## 2023-02-04 MED ORDER — MIDAZOLAM HCL 2 MG/2ML IJ SOLN
INTRAMUSCULAR | Status: AC
  Filled 2023-02-04: qty 4

## 2023-02-04 MED ORDER — MIDAZOLAM HCL 2 MG/2ML IJ SOLN
INTRAMUSCULAR | Status: AC | PRN
  Administered 2023-02-04 (×2): 1 mg via INTRAVENOUS

## 2023-02-04 MED ORDER — FENTANYL CITRATE (PF) 100 MCG/2ML IJ SOLN
INTRAMUSCULAR | Status: AC | PRN
  Administered 2023-02-04 (×2): 50 ug via INTRAVENOUS

## 2023-02-04 NOTE — Progress Notes (Signed)
   02/04/23 1915  Vitals  Temp 98.9 F (37.2 C)  Temp Source Oral  BP (!) 154/70  MAP (mmHg) 90  BP Location Right Leg  BP Method Automatic  Patient Position (if appropriate) Lying  Pulse Rate 93  ECG Heart Rate 90  Resp 20  MEWS COLOR  MEWS Score Color Green  Oxygen Therapy  SpO2 97 %  O2 Device Room Air  Pain Assessment  Pain Scale 0-10  Pain Score 0  Height and Weight  Height 5\' 9"  (1.753 m)  Weight 92.3 kg  Type of Scale Used Bed  BSA (Calculated - sq m) 2.12 sq meters  BMI (Calculated) 30.04  Weight in (lb) to have BMI = 25 168.9  MEWS Score  MEWS Temp 0  MEWS Systolic 0  MEWS Pulse 0  MEWS RR 0  MEWS LOC 0  MEWS Score 0   Received pt from [facility] via [facility]. VSS. Pt ambulatory w/ no complaints. Strong radial pulse in L arm. CHG done and tele applied, verified x2. Wife at bedside. Pt updated on plan of care, oriented to room and call light.

## 2023-02-04 NOTE — Progress Notes (Addendum)
Referring Physician(s): Woods,C/Mohamed,M  Supervising Physician: Ruthann Cancer  Patient Status:  [facility] - In-pt  Chief Complaint:  Left arm swelling with occluded left subclavian venous stents, lung cancer  Subjective: Pt doing ok this pm; denies fever,HA,CP,cough, abd/back pain,N/V or bleeding; has some mild dyspnea with exertion, easy bruising; underwent BM bx earlier today without complications; cont to have LUE edema but improved some on IV heparin   Allergies: Patient has no known allergies.  Medications: Prior to Admission medications   Medication Sig Start Date End Date Taking? Authorizing Provider  albuterol (VENTOLIN HFA) 108 (90 Base) MCG/ACT inhaler Inhale 1-2 puffs into the lungs every 4 (four) hours as needed for wheezing or shortness of breath.   Yes [provider]  Bacillus Coagulans-Inulin (PROBIOTIC) 1-250 BILLION-MG CAPS Take 1 capsule by mouth daily.   Yes [provider]  Brimonidine Tartrate (LUMIFY) 0.025 % SOLN Place 1 drop into both eyes daily as needed (redness).   Yes [provider]  Calcium-Phosphorus-Vitamin D (CALCIUM/D3 ADULT GUMMIES PO) Take 1 tablet by mouth 2 (two) times daily. Gummy has 500 mg of calcium, D3 1000 units, zinc 7 mg   Yes [provider]  Carboxymethylcellulose Sodium (REFRESH LIQUIGEL) 1 % GEL Place 1 drop into both eyes 2 (two) times daily as needed (dryness).   Yes [provider]  carvedilol (COREG) 3.125 MG tablet Take 3.125 mg by mouth 2 (two) times daily with a meal.   Yes [provider]  ELIQUIS 2.5 MG TABS tablet Take 2.5 mg by mouth 2 (two) times daily. 06/09/22  Yes [provider]  latanoprost (XALATAN) 0.005 % ophthalmic solution Place 1 drop into both eyes at bedtime.   Yes [provider]  loperamide (IMODIUM A-D) 2 MG tablet Take 2 mg by mouth as needed for diarrhea or loose stools.   Yes [provider]  losartan (COZAAR) 50 MG tablet  Take 50 mg by mouth daily. 04/25/21  Yes [provider]  Multiple Vitamin (MULTIVITAMIN WITH MINERALS) TABS tablet Take 1 tablet by mouth 2 (two) times daily.   Yes [provider]  rosuvastatin (CRESTOR) 10 MG tablet Take 10 mg by mouth daily. 07/10/20  Yes [provider]  SPIRIVA HANDIHALER 18 MCG inhalation capsule Place 18 mcg into inhaler and inhale daily. 08/26/20  Yes [provider]     Vital Signs: BP (!) 108/50 (BP Location: Right Leg)   Pulse 77   Temp 98.1 F (36.7 C) (Oral)   Resp 18   Ht 5\' 9"  (1.753 m)   Wt 203 lb 7.8 oz (92.3 kg)   SpO2 95%   BMI 30.05 kg/m   Physical Exam awake/alert; chest- distant BS bilat; heart- RRR; abd- soft,+BS,NT; no LE edema; LUE edema noted; sens/motor fxn ok  Imaging: CT BONE MARROW BIOPSY & ASPIRATION  Result Date: 02/04/2023 INDICATION: 77 year old male with history of pain status presenting for bone marrow biopsy. EXAM: CT-GUIDED BONE MARROW BIOPSY AND ASPIRATION MEDICATIONS: None ANESTHESIA/SEDATION: Fentanyl 100 mcg IV; Versed 2 mg IV Sedation Time: 10 minutes; The patient was continuously monitored during the procedure by the interventional radiology nurse under my direct supervision. COMPLICATIONS: None immediate. PROCEDURE: Informed consent was obtained from the patient following an explanation of the procedure, risks, benefits and alternatives. The patient understands, agrees and consents for the procedure. All questions were addressed. A time out was performed prior to the initiation of the procedure. The patient was positioned prone and non-contrast localization CT was  performed of the pelvis to demonstrate the iliac marrow spaces. The operative site was prepped and draped in the usual sterile fashion. Under sterile conditions and local anesthesia, a 22 gauge spinal needle was utilized for procedural planning. Next, an 11 gauge coaxial bone biopsy needle was advanced into the right iliac marrow space.  Needle position was confirmed with CT imaging. Initially, a bone marrow aspiration was performed. Next, a bone marrow biopsy was obtained with the 11 gauge outer bone marrow device. Samples were prepared with the cytotechnologist and deemed adequate. The needle was removed and superficial hemostasis was obtained with manual compression. A dressing was applied. The patient tolerated the procedure well without immediate post procedural complication. IMPRESSION: Successful CT guided right iliac bone marrow aspiration and core biopsy. Ruthann Cancer, MD [facility] Radiology Electronically Signed   By: Ruthann Cancer M.D.   On: 02/04/2023 10:04   IR Veno/Ext/Uni Left  Result Date: 02/03/2023 INDICATION: 77 year old male with suspected left subclavian stent occlusion. EXAM: 1. Ultrasound-guided vascular access of the left upper extremity 2. Left upper extremity venogram COMPARISON:  None Available. MEDICATIONS: None. ANESTHESIA/SEDATION: None. FLUOROSCOPY TIME:  Fifty-eight mGy COMPLICATIONS: None immediate. CONTRAST:  20 mL Omnipaque 300, intravenous TECHNIQUE: Informed written consent was obtained from the patient after a thorough discussion of the procedural risks, benefits and alternatives. All questions were addressed. Maximal Sterile Barrier Technique was utilized including caps, mask, sterile gowns, sterile gloves, sterile drape, hand hygiene and skin antiseptic. A timeout was performed prior to the initiation of the procedure. Left upper extremity was prepped and draped in standard fashion. Preprocedure ultrasound evaluation demonstrated patency of a prominent left brachial vein. Of note, there are multiple subcutaneous varicosities in the left upper extremity. Procedure was planned. Subdermal Local anesthesia was provided at the planned needle entry site. A small skin nick was made. Under direct ultrasound visualization, the left brachial artery was accessed with  a 20 gauge micropuncture needle. A permanent ultrasound image was captured and stored in the record. Micropuncture sheath was then introduced and left upper extremity venogram was performed. Venogram was significant for patent basilic and axillary veins. There is flush occlusion of the indwelling left subclavian stents. There are multiple chest wall collateral veins which appear mature. FINDINGS: Occluded indwelling left subclavian vein stents. IMPRESSION: Occluded indwelling left subclavian vein stents. Chronicity is indeterminate, though suspected to have chronic component. PLAN: IR will arrange for possible stent recanalization. Ruthann Cancer, MD [facility] Radiology Electronically Signed   By: Ruthann Cancer M.D.   On: 02/03/2023 17:09   IR US Guide Vasc Access Left  Result Date: 02/03/2023 INDICATION: 77 year old male with suspected left subclavian stent occlusion. EXAM: 1. Ultrasound-guided vascular access of the left upper extremity 2. Left upper extremity venogram COMPARISON:  None Available. MEDICATIONS: None. ANESTHESIA/SEDATION: None. FLUOROSCOPY TIME:  Fifty-eight mGy COMPLICATIONS: None immediate. CONTRAST:  20 mL Omnipaque 300, intravenous TECHNIQUE: Informed written consent was obtained from the patient after a thorough discussion of the procedural risks, benefits and alternatives. All questions were addressed. Maximal Sterile Barrier Technique was utilized including caps, mask, sterile gowns, sterile gloves, sterile drape, hand hygiene and skin antiseptic. A timeout was performed prior to the initiation of the procedure. Left upper extremity was prepped and draped in standard fashion. Preprocedure ultrasound evaluation demonstrated patency of a prominent left brachial vein. Of note, there are multiple subcutaneous varicosities in the left upper extremity. Procedure was planned. Subdermal Local anesthesia was provided at  the planned needle entry  site. A small skin nick was made. Under direct ultrasound visualization, the left brachial artery was accessed with a 20 gauge micropuncture needle. A permanent ultrasound image was captured and stored in the record. Micropuncture sheath was then introduced and left upper extremity venogram was performed. Venogram was significant for patent basilic and axillary veins. There is flush occlusion of the indwelling left subclavian stents. There are multiple chest wall collateral veins which appear mature. FINDINGS: Occluded indwelling left subclavian vein stents. IMPRESSION: Occluded indwelling left subclavian vein stents. Chronicity is indeterminate, though suspected to have chronic component. PLAN: IR will arrange for possible stent recanalization. Ruthann Cancer, MD [facility] Radiology Electronically Signed   By: Ruthann Cancer M.D.   On: 02/03/2023 17:09   US Abdomen Limited RUQ (LIVER/GB)  Result Date: 02/03/2023 CLINICAL DATA:  100250 Liver disease 100250 EXAM: ULTRASOUND ABDOMEN LIMITED RIGHT UPPER QUADRANT COMPARISON:  10/11/2018 FINDINGS: Gallbladder: No gallstones, pericholecystic fluid or wall thickening visualized. Common bile duct: Diameter: 0.4 cm. Liver: Liver is hyperechoic consistent with fatty infiltration. No focal hepatic lesions identified. No intrahepatic ductal dilatation. Hepatopetal portal vein. IMPRESSION: Hepatic fatty infiltration. Otherwise unremarkable examination of the right upper quadrant. Electronically Signed   By: Sammie Bench M.D.   On: 02/03/2023 06:35   CT Angio Chest Pulmonary Embolism (PE) W or WO Contrast  Result Date: 02/03/2023 CLINICAL DATA:  Pulmonary embolism suspected, high probability. Shortness of breath getting worse for months. Left arm swelling. Left subclavian stent. History of lung cancer and partial left lung removal. COPD. EXAM: CT ANGIOGRAPHY CHEST WITH CONTRAST TECHNIQUE: Multidetector CT imaging of the  chest was performed using the standard protocol during bolus administration of intravenous contrast. Multiplanar CT image reconstructions and MIPs were obtained to evaluate the vascular anatomy. RADIATION DOSE REDUCTION: This exam was performed according to the departmental dose-optimization program which includes automated exposure control, adjustment of the mA and/or kV according to patient size and/or use of iterative reconstruction technique. CONTRAST:  1mL OMNIPAQUE IOHEXOL 350 MG/ML SOLN COMPARISON:  09/20/2022. FINDINGS: Cardiovascular: The heart is normal in size and there is no pericardial effusion. Multi-vessel coronary artery calcifications are noted. There is atherosclerotic calcification of the aorta without evidence of aneurysm. The pulmonary trunk is normal in caliber. No pulmonary artery filling defect is identified. Examination is limited due to respiratory motion and mixing artifact. Left subclavian stent is in place, however patency can not be determined due to timing of contrast bolus. Mediastinum/Nodes: No mediastinal, hilar, or axillary lymphadenopathy. The thyroid gland, trachea, and esophagus are within normal limits. Lungs/Pleura: Paraseptal and centrilobular emphysematous changes are present in the lungs. Postsurgical changes are noted at the left hilum with reduced lung volume on the left. A stable cystic lesion is noted in the perihilar region of the left lower lobe. No effusion or pneumothorax. A 3 mm nodule is present in the right middle lobe, unchanged. Upper Abdomen: No acute abnormality. Musculoskeletal: The bony structures are stable. No acute or suspicious osseous abnormality. Review of the MIP images confirms the above findings. IMPRESSION: 1. No evidence of pulmonary embolism or other acute process. 2. Stable postsurgical changes in the left upper lobe. 3. Emphysema. 4. Aortic atherosclerosis and coronary artery calcifications. Electronically Signed   By: Brett Fairy M.D.    On: 02/03/2023 00:22   US Venous Img Upper Uni Left  Result Date: 02/02/2023 CLINICAL DATA:  Left upper extremity swelling for 3-4 days. EXAM: LEFT UPPER EXTREMITY VENOUS  DOPPLER ULTRASOUND TECHNIQUE: Gray-scale sonography with graded compression, as well as color Doppler and duplex ultrasound were performed to evaluate the upper extremity deep venous system from the level of the subclavian vein and including the jugular, axillary, basilic, radial, ulnar and upper cephalic vein. Spectral Doppler was utilized to evaluate flow at rest and with distal augmentation maneuvers. COMPARISON:  None Available. FINDINGS: Contralateral Subclavian Vein: Respiratory phasicity is normal and symmetric with the symptomatic side. No evidence of thrombus. Normal compressibility. Internal Jugular Vein: No evidence of thrombus. Normal compressibility, respiratory phasicity and response to augmentation. Subclavian Vein: Thrombus identified within the left subclavian. Axillary Vein: No evidence of thrombus. Normal compressibility, respiratory phasicity and response to augmentation. Cephalic Vein: No evidence of thrombus. Normal compressibility, respiratory phasicity and response to augmentation. Basilic Vein: No evidence of thrombus. Normal compressibility, respiratory phasicity and response to augmentation. Brachial Veins: No evidence of thrombus. Normal compressibility, respiratory phasicity and response to augmentation. Radial Veins: No evidence of thrombus. Normal compressibility, respiratory phasicity and response to augmentation. Ulnar Veins: No evidence of thrombus. Normal compressibility, respiratory phasicity and response to augmentation. Venous Reflux:  None visualized. Other Findings:  None visualized. IMPRESSION: Exam positive for thrombus within the left subclavian vein. These results were called by telephone at the time of interpretation on 02/02/2023 at 1:37 pm to provider [facility] , who verbally acknowledged  these results. Electronically Signed   By: Kerby Moors M.D.   On: 02/02/2023 13:39   DG Chest 2 View  Result Date: 02/02/2023 CLINICAL DATA:  Shortness of breath. EXAM: CHEST - 2 VIEW COMPARISON:  January 03, 2018. FINDINGS: The heart size and mediastinal contours are within normal limits. Old left rib fractures are noted. Minimal right perihilar subsegmental atelectasis or scarring is noted. Left lung is unremarkable. IMPRESSION: Minimal right perihilar subsegmental atelectasis or scarring. Electronically Signed   By: Marijo Conception M.D.   On: 02/02/2023 12:05    Labs:  CBC: Recent Labs    02/02/23 1135 02/02/23 1325 02/03/23 0529 02/04/23 0153  WBC 1.5* 1.4* 1.3* 1.6*  HGB 10.8* 10.6* 9.1* 9.4*  HCT 30.2* 30.0* 26.9* 27.5*  PLT 41* 42* 35* 38*    COAGS: Recent Labs    02/02/23 1248 02/02/23 2255 02/03/23 0733 02/04/23 0153  INR 1.0  --   --   --   APTT  --  52* 86* 87*    BMP: Recent Labs    09/20/22 1524 02/02/23 1135 02/03/23 0529 02/04/23 0153  NA 137 136 136 135  K 4.2 4.4 3.6 3.6  CL 103 103 103 101  CO2 30 23 26 25   GLUCOSE 93 101* 98 103*  BUN 7* 8 9 9   CALCIUM 9.0 9.5 8.7* 8.5*  CREATININE 0.62 0.57* 0.60* 0.73  GFRNONAA >60 >60 >60 >60    LIVER FUNCTION TESTS: Recent Labs    09/20/22 1524 02/02/23 1135 02/03/23 0529 02/04/23 0153  BILITOT 0.4 0.6 0.9 0.8  AST 21 16 16 18   ALT 25 14 16 17   ALKPHOS 87 69 57 61  PROT 6.2* 6.7 6.0* 6.3*  ALBUMIN 3.9 4.1 3.2* 3.6    Assessment and Plan: 76 y.o. 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 He is familiar to IR from prior treatments including thrombolysis and stent placements in 2007. On OP eliquis- now  on IV heparin. He presented to [facility] ED 02/02/23 with complaints of left arm swelling. He was  admitted for acute thrombosis of the left subclavian vein and worsening pancytopenia. Underwent LUE venogram on 02/03/23 which revealed occluded indwelling left subclavian vein stents; s/p BM bx this am for pancytopenia; plans are now underway for pt to be transferred to [facility] to undergo LUE/central venogram with recanalization of occluded stents/possible new stent placement on 02/07/23 with anesthesia assistance.  Details/risks of procedure, including but not limited to, internal bleeding, infection, injury to adjacent structures, inability to recanalize stents discussed with patient /spouse with their understanding and consent.  [facility] aware of plans and will coordinate transfer of patient to [facility] over the weekend.  Electronically Signed: D. Rowe Robert, PA-C 02/04/2023, 2:14 PM   I spent a total of 25 minutes at the the patient's bedside AND on the patient's hospital floor or unit, greater than 50% of which was counseling/coordinating care for left upper extremity/central venogram with recanalization of occluded subclavian vein stents and possible new stent placement    Patient ID: [name], male   DOB: [birth year], 77 y.o.   MRN: [id number]

## 2023-02-04 NOTE — Progress Notes (Signed)
PROGRESS NOTE    [name]  UMP:[id number] DOB: [birth year] DOA: 02/02/2023 PCP: Jodene Nam, MD     Brief Narrative:  77 y.o. WM PMHx Lung cancer s/p XRT, Hx prior subclavian thrombosis S/P stenting x 2 in 2007, COPD, HLD, HTN, ischemic colitis, OSA, EtOH abuse (Drinks 1.5 L wine/ day  1 beer in AM)    Presented with left arm swelling Presents with left eye swelling for the past 3 to 4 days shortness of breath started to get worse for past few months. 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  A/O x 4 sitting in bed comfortably eating lunch.  Negative SOB, negative CP, negative pain..      Assessment & Plan: Covid vaccination;   Principal Problem:   Subclavian vein thrombosis ([place]) Active Problems:   Hyperlipidemia   COPD GOLD II    OSA (obstructive sleep apnea)   Alcohol abuse   Malignant neoplasm of lower lobe, unspecified bronchus or lung (HCC)   Pancytopenia (HCC)  Acute embolism and thrombosis of left subclavian vein ([place]) -2/15 venogram by IR pending. - 2/15 spoke at length with NP [facility] IR plan is for venogram today, depending upon finding will decide if patient requires graft, stent, filter.  However that will not be placed until 2/16 - 2/15 per NP [facility] IR patient will receive his bone marrow biopsy on 2/16 and will require general  anesthesia at that time. -2/15 per NP [facility] IR new guidelines anticoagulation does not need to be stopped for bone marrow biopsies, only for deep organ biopsies.. - Eliquis has already been stopped, continue patient on heparin drip. -SOB resolved -2/16 Discussed case at length with Dr. Ruthann Cancer IR, they do not have the equipment here at [facility] to perform the stents required in his left arm.  Are currently in the process of trying to find an operative space for him at [facility] on Monday.  Will await phone call for plan from IR. ADDENDUM notified by [facility] that they had arranged for patient to have procedure on Monday 19 February @0800  at [facility]  EtOH abuse - CIWA protocol -2/15 has not required any Ativan today.  EtOH liver cirrhosis - 2/15 abdominal ultrasound fatty liver - 2/16 liver cirrhosis ruled out  Essential HTN - Coreg 3.125 mg BID    Hyperlipidemia -Crestor 10 mg daily    COPD GOLD II  -Chronic stable continue home medications   OSA (obstructive sleep apnea) -Noncompliant with CPAP at home however will offer patient CPAP while hospitalized.   Malignant neoplasm of lower lobe, unspecified bronchus or lung ([place]) -Dr. Lorna Few patient's oncologist, aware the patient is being admitted   Pancytopenia [facility]) -Admitting physician discussed with oncology on-call. -Most likely multifactorial to include EtOH abuse, dietary deficiency, malignancy, liver cirrhosis.  Workup as above    Obesity( 30.05 kg/m.) -Address with PCP as outpatient         Mobility Assessment (last 72 hours)     Mobility Assessment     Row Name 02/03/23 2038 02/03/23 0929 02/02/23 2033       Does patient have an order for bedrest or is patient medically unstable No - Continue assessment No - Continue assessment No - Continue assessment     What is the highest level of mobility based on the progressive mobility assessment? Level 6 (Walks independently in room and hall) - Balance  while walking in room without assist - Complete Level 5 (Walks with assist in room/hall) - Balance while stepping forward/back and can walk in room with assist - Complete Level 6 (Walks independently in room and hall) - Balance while walking in room without assist - Complete                    DVT prophylaxis: Heparin drip Code Status: Full Family Communication: 2/16 wife at bedside for discussion of plan of care all questions answered Status is: Inpatient    Dispo: The patient is from: Home              Anticipated d/c is to: Home              Anticipated d/c date is: 3 days              Patient currently is not medically stable to d/c.      Consultants:  IR   Procedures/Significant Events:  2/15 US abdomen ALP:FXTKWIO fatty infiltration  2/15 LEFT upper extremity venogram:Occluded indwelling left subclavian vein stents. Chronicity is indeterminate, though suspected to have chronic component. 2/16 CT bone marrow biopsy and aspiration: Results pending   I have personally reviewed and interpreted all radiology studies and my findings are as above.  VENTILATOR SETTINGS:    Cultures   Antimicrobials:    Devices    LINES / TUBES:      Continuous Infusions:  heparin 1,600 Units/hr (02/04/23 0456)     Objective: Vitals:   02/04/23 0832 02/04/23 0915 02/04/23 0930 02/04/23 1020  BP:  (!) 145/58 (!) 156/69 (!) 142/51  Pulse:  84 79 77  Resp:  16 16 18   Temp:    98.3 F (36.8 C)  TempSrc:    Oral  SpO2: 98% 100% 99% 96%  Weight:      Height:        Intake/Output Summary (Last 24 hours) at 02/04/2023 1101 Last data filed at 02/04/2023 0815 Gross per 24 hour  Intake 767.71 ml  Output 2150 ml  Net -1382.29 ml    Filed Weights   02/02/23 1120  Weight: 92.3 kg   Physical Exam:  General: A/O x 4 No acute respiratory distress Eyes: negative scleral hemorrhage, negative anisocoria, negative icterus ENT: Negative Runny nose, negative gingival  bleeding, Neck:  Negative scars, masses, torticollis, lymphadenopathy, JVD Lungs: Clear to auscultation bilaterally without wheezes or crackles Cardiovascular: Regular rate and rhythm without murmur gallop or rub normal S1 and S2 Abdomen: OBESE, negative abdominal pain, nondistended, positive soft, bowel sounds, no rebound, no ascites, no appreciable mass Extremities: No significant cyanosis, clubbing, or edema bilateral lower extremities Skin: Negative rashes, lesions, ulcers Psychiatric:  Negative depression, negative anxiety, negative fatigue, negative mania  Central nervous system:  Cranial nerves II through XII intact, tongue/uvula midline, all extremities muscle strength 5/5, sensation intact throughout, negative dysarthria,  negative expressive aphasia, negative receptive aphasia.   .     Data Reviewed: Care during the described time interval was provided by me .  I have reviewed this patient's available data, including medical history, events of note, physical examination, and all test results as part of my evaluation.  CBC: Recent Labs  Lab 02/02/23 1135 02/02/23 1325 02/03/23 0529 02/04/23 0153  WBC 1.5* 1.4* 1.3* 1.6*  NEUTROABS  --  0.3* 0.2* 0.2*  HGB 10.8* 10.6* 9.1* 9.4*  HCT 30.2* 30.0* 26.9* 27.5*  MCV 110.2* 111.5* 115.9* 112.7*  PLT 41* 42* 35* 38*    Basic Metabolic Panel: Recent Labs  Lab 02/02/23 1135 02/02/23 2255 02/03/23 0529 02/04/23 0153  NA 136  --  136 135  K 4.4  --  3.6 3.6  CL 103  --  103 101  CO2 23  --  26 25  GLUCOSE 101*  --  98 103*  BUN 8  --  9 9  CREATININE 0.57*  --  0.60* 0.73  CALCIUM 9.5  --  8.7* 8.5*  MG  --  2.1 2.0 2.0  PHOS  --  3.3 3.9 3.4    GFR: Estimated Creatinine Clearance: 89.5 mL/min (by C-G formula based on SCr of 0.73 mg/dL). Liver Function Tests: Recent Labs  Lab 02/02/23 1135 02/03/23 0529 02/04/23 0153  AST 16 16 18   ALT 14 16 17   ALKPHOS 69 57 61  BILITOT 0.6 0.9 0.8  PROT 6.7 6.0* 6.3*   ALBUMIN 4.1 3.2* 3.6    No results for input(s): "LIPASE", "AMYLASE" in the last 168 hours. No results for input(s): "AMMONIA" in the last 168 hours. Coagulation Profile: Recent Labs  Lab 02/02/23 1248  INR 1.0    Cardiac Enzymes: Recent Labs  Lab 02/02/23 2255  CKTOTAL 54    BNP (last 3 results) No results for input(s): "PROBNP" in the last 8760 hours. HbA1C: No results for input(s): "HGBA1C" in the last 72 hours. CBG: No results for input(s): "GLUCAP" in the last 168 hours. Lipid Profile: No results for input(s): "CHOL", "HDL", "LDLCALC", "TRIG", "CHOLHDL", "LDLDIRECT" in the last 72 hours. Thyroid Function Tests: Recent Labs    02/02/23 2255  TSH 2.035    Anemia Panel: Recent Labs    02/02/23 2255  VITAMINB12 383  FOLATE 31.3  FERRITIN 405*  TIBC 298  IRON 191*  RETICCTPCT 1.5    Sepsis Labs: Recent Labs  Lab 02/03/23 0024 02/03/23 0529  LATICACIDVEN 1.2 0.8     No results found for this or any previous visit (from the past 240 hour(s)).       Radiology Studies: CT BONE MARROW BIOPSY & ASPIRATION  Result Date: 02/04/2023 INDICATION: 77 year old male with history of pain status presenting for bone marrow biopsy. EXAM: CT-GUIDED BONE MARROW BIOPSY AND ASPIRATION MEDICATIONS: None ANESTHESIA/SEDATION: Fentanyl 100 mcg IV; Versed 2 mg IV Sedation Time: 10 minutes; The patient was continuously monitored during the procedure by the interventional radiology nurse under my direct supervision. COMPLICATIONS: None immediate. PROCEDURE: Informed consent was obtained from the patient following an explanation of the procedure, risks, benefits and alternatives. The patient understands, agrees and consents for the procedure. All questions were addressed. A time out was performed prior to the initiation of the procedure. The patient was positioned prone and non-contrast localization CT was performed of the pelvis to demonstrate the iliac marrow spaces. The  operative site was prepped and draped in the usual sterile fashion. Under sterile conditions and local  anesthesia, a 22 gauge spinal needle was utilized for procedural planning. Next, an 11 gauge coaxial bone biopsy needle was advanced into the right iliac marrow space. Needle position was confirmed with CT imaging. Initially, a bone marrow aspiration was performed. Next, a bone marrow biopsy was obtained with the 11 gauge outer bone marrow device. Samples were prepared with the cytotechnologist and deemed adequate. The needle was removed and superficial hemostasis was obtained with manual compression. A dressing was applied. The patient tolerated the procedure well without immediate post procedural complication. IMPRESSION: Successful CT guided right iliac bone marrow aspiration and core biopsy. Ruthann Cancer, MD [facility] Radiology Electronically Signed   By: Ruthann Cancer M.D.   On: 02/04/2023 10:04   IR Veno/Ext/Uni Left  Result Date: 02/03/2023 INDICATION: 77 year old male with suspected left subclavian stent occlusion. EXAM: 1. Ultrasound-guided vascular access of the left upper extremity 2. Left upper extremity venogram COMPARISON:  None Available. MEDICATIONS: None. ANESTHESIA/SEDATION: None. FLUOROSCOPY TIME:  Fifty-eight mGy COMPLICATIONS: None immediate. CONTRAST:  20 mL Omnipaque 300, intravenous TECHNIQUE: Informed written consent was obtained from the patient after a thorough discussion of the procedural risks, benefits and alternatives. All questions were addressed. Maximal Sterile Barrier Technique was utilized including caps, mask, sterile gowns, sterile gloves, sterile drape, hand hygiene and skin antiseptic. A timeout was performed prior to the initiation of the procedure. Left upper extremity was prepped and draped in standard fashion. Preprocedure ultrasound evaluation demonstrated patency of a prominent left brachial vein. Of note, there are  multiple subcutaneous varicosities in the left upper extremity. Procedure was planned. Subdermal Local anesthesia was provided at the planned needle entry site. A small skin nick was made. Under direct ultrasound visualization, the left brachial artery was accessed with a 20 gauge micropuncture needle. A permanent ultrasound image was captured and stored in the record. Micropuncture sheath was then introduced and left upper extremity venogram was performed. Venogram was significant for patent basilic and axillary veins. There is flush occlusion of the indwelling left subclavian stents. There are multiple chest wall collateral veins which appear mature. FINDINGS: Occluded indwelling left subclavian vein stents. IMPRESSION: Occluded indwelling left subclavian vein stents. Chronicity is indeterminate, though suspected to have chronic component. PLAN: IR will arrange for possible stent recanalization. Ruthann Cancer, MD [facility] Radiology Electronically Signed   By: Ruthann Cancer M.D.   On: 02/03/2023 17:09   IR US Guide Vasc Access Left  Result Date: 02/03/2023 INDICATION: 77 year old male with suspected left subclavian stent occlusion. EXAM: 1. Ultrasound-guided vascular access of the left upper extremity 2. Left upper extremity venogram COMPARISON:  None Available. MEDICATIONS: None. ANESTHESIA/SEDATION: None. FLUOROSCOPY TIME:  Fifty-eight mGy COMPLICATIONS: None immediate. CONTRAST:  20 mL Omnipaque 300, intravenous TECHNIQUE: Informed written consent was obtained from the patient after a thorough discussion of the procedural risks, benefits and alternatives. All questions were addressed. Maximal Sterile Barrier Technique was utilized including caps, mask, sterile gowns, sterile gloves, sterile drape, hand hygiene and skin antiseptic. A timeout was performed prior to the initiation of the procedure. Left upper extremity was prepped and draped in standard  fashion. Preprocedure ultrasound evaluation demonstrated patency of a prominent left brachial vein. Of note, there are multiple subcutaneous varicosities in the left upper extremity. Procedure was planned. Subdermal Local anesthesia was provided at the planned needle entry site. A small skin nick was made. Under direct ultrasound visualization, the left brachial artery was accessed with a 20 gauge micropuncture needle.  A permanent ultrasound image was captured and stored in the record. Micropuncture sheath was then introduced and left upper extremity venogram was performed. Venogram was significant for patent basilic and axillary veins. There is flush occlusion of the indwelling left subclavian stents. There are multiple chest wall collateral veins which appear mature. FINDINGS: Occluded indwelling left subclavian vein stents. IMPRESSION: Occluded indwelling left subclavian vein stents. Chronicity is indeterminate, though suspected to have chronic component. PLAN: IR will arrange for possible stent recanalization. Ruthann Cancer, MD [facility] Radiology Electronically Signed   By: Ruthann Cancer M.D.   On: 02/03/2023 17:09   US Abdomen Limited RUQ (LIVER/GB)  Result Date: 02/03/2023 CLINICAL DATA:  100250 Liver disease 100250 EXAM: ULTRASOUND ABDOMEN LIMITED RIGHT UPPER QUADRANT COMPARISON:  10/11/2018 FINDINGS: Gallbladder: No gallstones, pericholecystic fluid or wall thickening visualized. Common bile duct: Diameter: 0.4 cm. Liver: Liver is hyperechoic consistent with fatty infiltration. No focal hepatic lesions identified. No intrahepatic ductal dilatation. Hepatopetal portal vein. IMPRESSION: Hepatic fatty infiltration. Otherwise unremarkable examination of the right upper quadrant. Electronically Signed   By: Sammie Bench M.D.   On: 02/03/2023 06:35   CT Angio Chest Pulmonary Embolism (PE) W or WO Contrast  Result Date: 02/03/2023 CLINICAL DATA:   Pulmonary embolism suspected, high probability. Shortness of breath getting worse for months. Left arm swelling. Left subclavian stent. History of lung cancer and partial left lung removal. COPD. EXAM: CT ANGIOGRAPHY CHEST WITH CONTRAST TECHNIQUE: Multidetector CT imaging of the chest was performed using the standard protocol during bolus administration of intravenous contrast. Multiplanar CT image reconstructions and MIPs were obtained to evaluate the vascular anatomy. RADIATION DOSE REDUCTION: This exam was performed according to the departmental dose-optimization program which includes automated exposure control, adjustment of the mA and/or kV according to patient size and/or use of iterative reconstruction technique. CONTRAST:  11mL OMNIPAQUE IOHEXOL 350 MG/ML SOLN COMPARISON:  09/20/2022. FINDINGS: Cardiovascular: The heart is normal in size and there is no pericardial effusion. Multi-vessel coronary artery calcifications are noted. There is atherosclerotic calcification of the aorta without evidence of aneurysm. The pulmonary trunk is normal in caliber. No pulmonary artery filling defect is identified. Examination is limited due to respiratory motion and mixing artifact. Left subclavian stent is in place, however patency can not be determined due to timing of contrast bolus. Mediastinum/Nodes: No mediastinal, hilar, or axillary lymphadenopathy. The thyroid gland, trachea, and esophagus are within normal limits. Lungs/Pleura: Paraseptal and centrilobular emphysematous changes are present in the lungs. Postsurgical changes are noted at the left hilum with reduced lung volume on the left. A stable cystic lesion is noted in the perihilar region of the left lower lobe. No effusion or pneumothorax. A 3 mm nodule is present in the right middle lobe, unchanged. Upper Abdomen: No acute abnormality. Musculoskeletal: The bony structures are stable. No acute or suspicious osseous abnormality. Review of the MIP images  confirms the above findings. IMPRESSION: 1. No evidence of pulmonary embolism or other acute process. 2. Stable postsurgical changes in the left upper lobe. 3. Emphysema. 4. Aortic atherosclerosis and coronary artery calcifications. Electronically Signed   By: Brett Fairy M.D.   On: 02/03/2023 00:22   US Venous Img Upper Uni Left  Result Date: 02/02/2023 CLINICAL DATA:  Left upper extremity swelling for 3-4 days. EXAM: LEFT UPPER EXTREMITY VENOUS DOPPLER ULTRASOUND TECHNIQUE: Gray-scale sonography with graded compression, as well as color Doppler and duplex ultrasound were performed to evaluate the upper extremity deep venous system from the  level of the subclavian vein and including the jugular, axillary, basilic, radial, ulnar and upper cephalic vein. Spectral Doppler was utilized to evaluate flow at rest and with distal augmentation maneuvers. COMPARISON:  None Available. FINDINGS: Contralateral Subclavian Vein: Respiratory phasicity is normal and symmetric with the symptomatic side. No evidence of thrombus. Normal compressibility. Internal Jugular Vein: No evidence of thrombus. Normal compressibility, respiratory phasicity and response to augmentation. Subclavian Vein: Thrombus identified within the left subclavian. Axillary Vein: No evidence of thrombus. Normal compressibility, respiratory phasicity and response to augmentation. Cephalic Vein: No evidence of thrombus. Normal compressibility, respiratory phasicity and response to augmentation. Basilic Vein: No evidence of thrombus. Normal compressibility, respiratory phasicity and response to augmentation. Brachial Veins: No evidence of thrombus. Normal compressibility, respiratory phasicity and response to augmentation. Radial Veins: No evidence of thrombus. Normal compressibility, respiratory phasicity and response to augmentation. Ulnar Veins: No evidence of thrombus. Normal compressibility, respiratory phasicity and response to augmentation. Venous  Reflux:  None visualized. Other Findings:  None visualized. IMPRESSION: Exam positive for thrombus within the left subclavian vein. These results were called by telephone at the time of interpretation on 02/02/2023 at 1:37 pm to provider [facility] , who verbally acknowledged these results. Electronically Signed   By: Kerby Moors M.D.   On: 02/02/2023 13:39   DG Chest 2 View  Result Date: 02/02/2023 CLINICAL DATA:  Shortness of breath. EXAM: CHEST - 2 VIEW COMPARISON:  January 03, 2018. FINDINGS: The heart size and mediastinal contours are within normal limits. Old left rib fractures are noted. Minimal right perihilar subsegmental atelectasis or scarring is noted. Left lung is unremarkable. IMPRESSION: Minimal right perihilar subsegmental atelectasis or scarring. Electronically Signed   By: Marijo Conception M.D.   On: 02/02/2023 12:05        Scheduled Meds:  carvedilol  3.125 mg Oral BID WC   fentaNYL       folic acid  1 mg Oral Daily   latanoprost  1 drop Both Eyes QHS   midazolam       multivitamin with minerals  1 tablet Oral Daily   rosuvastatin  10 mg Oral Daily   sodium chloride flush  10-40 mL Intracatheter Q12H   thiamine  100 mg Oral Daily   Or   thiamine  100 mg Intravenous Daily   umeclidinium bromide  1 puff Inhalation Daily   Continuous Infusions:  heparin 1,600 Units/hr (02/04/23 0456)     LOS: 2 days    Time spent:40 min   Dannica Bickham, Geraldo Docker, MD [facility]   If 7PM-7AM, please contact night-coverage 02/04/2023, 11:01 AM

## 2023-02-04 NOTE — Procedures (Signed)
Interventional Radiology Procedure Note  Procedure: CT guided aspirate and core biopsy of right iliac bone  Complications: None  Recommendations: - Bedrest supine x 1 hrs - Follow biopsy results   Ruthann Cancer, MD

## 2023-02-04 NOTE — TOC Initial Note (Signed)
Transition of Care [facility]) - Initial/Assessment Note    Patient Details  Name: [name] MRN: [id number] Date of Birth: 12/04/1946  Transition of Care [facility]) CM/SW Contact:    Bethann Berkshire, [place] Phone Number: 02/04/2023, 10:23 AM  Clinical Narrative:                  CSW met with pt bedside in response to [facility] consult for SA. Pt agreeable to discuss with his wife present. Reports 1.5L of wine/day & 1 beer. Pt is currently working with his PCP to reduce alcohol consumption; prior to this admission he reports he was down to 1L. He is hoping to maintain abstinence following this admission as he has not had anything to drink while here. Has been to [facility] for OP tx in the past. Not interested in [place]. Accepting of local tx resources. CSW provide resource list.   Expected Discharge Plan: Home/Self Care Barriers to Discharge: Continued Medical Work up   Patient Goals and CMS Choice            Expected Discharge Plan and Services       Living arrangements for the past 2 months: Single Family Home                                      Prior Living Arrangements/Services Living arrangements for the past 2 months: Single Family Home Lives with:: Spouse                   Activities of Daily Living Home Assistive Devices/Equipment: None ADL Screening (condition at time of admission) Patient's cognitive ability adequate to safely complete daily activities?: Yes Is the patient deaf or have difficulty hearing?: Yes Does the patient have difficulty seeing, even when wearing glasses/contacts?: No Does the patient have difficulty concentrating, remembering, or making decisions?: No Patient able to express need for assistance with ADLs?: No Does the patient have difficulty dressing or bathing?: No Independently performs ADLs?: Yes (appropriate for developmental age) Does the patient have difficulty walking or climbing stairs?: No Weakness of Legs: None Weakness of  Arms/Hands: None  Permission Sought/Granted                  Emotional Assessment Appearance:: Appears stated age Attitude/Demeanor/Rapport: Engaged Affect (typically observed): Accepting        Admission diagnosis:  Subclavian vein thrombosis ([place]) [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.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] Acute embolism and thrombosis of left subclavian vein (HCC) [D98.B12] Pancytopenia (HCC) [D61.818] Alcohol use disorder [F10.90] Patient Active Problem List   Diagnosis Date Noted   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   Pancytopenia ([place]) 02/02/2023   Alcohol abuse 05/01/2021   Altered mental status 05/01/2021   Hardening of the aorta (main artery of the heart) ([place]) 05/01/2021   Deep vein thrombosis (DVT) of non-extremity vein 33/82/5053   Diastolic dysfunction 97/67/3419   Fatty liver 05/01/2021   Glaucoma 05/01/2021   History of DVT (deep vein thrombosis) 05/01/2021   Impaired fasting glucose 05/01/2021   Long term (current) use of anticoagulants 05/01/2021   Malignant neoplasm of lower lobe, unspecified bronchus or lung ([place]) 05/01/2021   Body mass index (BMI) 32.0-32.9, adult 05/01/2021   Personal history of other malignant neoplasm of bronchus and lung 05/01/2021   Vascular disorder of intestine ([place]) 05/01/2021   Plantar flexed metatarsal bone of right foot 03/20/2021   Ulcer of foot ([place]) 08/05/2020   Palpitations  04/25/2019   OSA (obstructive sleep apnea) 04/25/2019   Adenocarcinoma of right lung, stage 1 ([place]) 03/03/2017   S/P partial lobectomy of lung 01/12/2017   Nodule of right lung 12/28/2016   COPD GOLD II  10/07/2016   Pulmonary infiltrate 10/04/2016   Alcohol withdrawal ([place]) 03/19/2015   Near syncope 03/19/2015   Hypertension 03/19/2015   Hyperlipidemia 03/19/2015   Warfarin-induced coagulopathy ([place]) 03/19/2015   Hereditary and idiopathic peripheral neuropathy 09/26/2014   PCP:  Jodene Nam, MD Pharmacy:   [facility] [id number] - [address], [place] [address] Phone: [phone] Fax: [phone]     Social Determinants of Health (SDOH) Social History: SDOH Screenings   Food Insecurity: No Food Insecurity (02/02/2023)  Housing: Low Risk  (02/02/2023)  Transportation Needs: No Transportation Needs (02/02/2023)  Utilities: Not At Risk (02/02/2023)  Tobacco Use: Medium Risk (02/03/2023)   SDOH Interventions:     Readmission Risk Interventions     No data to display

## 2023-02-04 NOTE — Progress Notes (Signed)
ANTICOAGULATION CONSULT NOTE - Follow Up Consult  Pharmacy Consult for heparin Indication: hx left arm subclavian vein thrombosis and now acute left subclavian vein thrombus (PTA Eliquis on hold)  No Known Allergies   Patient Measurements: Height: 5\' 9"  (175.3 cm) Weight: 92.3 kg (203 lb 7.8 oz) IBW/kg (Calculated) : 70.7 Heparin Dosing Weight: 90 kg  Vital Signs: Temp: 98.3 F (36.8 C) (02/15 2024) Temp Source: Oral (02/15 2024) BP: 141/74 (02/15 2024) Pulse Rate: 88 (02/15 2024)  Labs: Recent Labs    02/02/23 1135 02/02/23 1248 02/02/23 1325 02/02/23 2255 02/03/23 0529 02/03/23 0733 02/04/23 0153  HGB 10.8*  --  10.6*  --  9.1*  --  9.4*  HCT 30.2*  --  30.0*  --  26.9*  --  27.5*  PLT 41*  --  42*  --  35*  --  38*  APTT  --   --   --  52*  --  86* 87*  LABPROT  --  13.4  --   --   --   --   --   INR  --  1.0  --   --   --   --   --   HEPARINUNFRC  --   --   --  0.86*  --  0.71* 0.50  CREATININE 0.57*  --   --   --  0.60*  --  0.73  CKTOTAL  --   --   --  54  --   --   --      Estimated Creatinine Clearance: 89.5 mL/min (by C-G formula based on SCr of 0.73 mg/dL).   Medications:  - on Eliquis 2.5 mg bid (last dose taken on 02/02/23 at [facility]). Pt's wife reported that he is compliance with this medication and has not missed any doses at home.  Assessment: Patient's a 77 y.o with hx NSCLC and subclavian vein thrombosis in 2007 who presented to the ED on 02/02/26 with c/o left arm swelling.  Pt's wife reported that he was started on warfarin after the LUE DVT in 2007 and switched over to Eliquis by his PCP last year.  Left UE doppler on 02/02/23 showed "thrombus within the left subclavian vein."  He's currently on heparin drip for VTE treatment.  Today, 02/04/2023:  - heparin level is 0.5 , aPTT 87 secs --> heparin level and aPTT levels are correlating at this time and both therapeutic with heparin gtt infusing @ 1600 units/hr  Will monitor heparin therapy with only  heparin levels now.   - hgb 9.4, plts low (38K)  - no bleeding documented    Goal of Therapy:  Heparin level 0.3-0.7 units/ml aPTT 66-102 seconds Monitor platelets by anticoagulation protocol: Yes   Plan:  - continue heparin drip at 1600 units/hr  - check daily heparin level and CBC - monitor for s/sx bleeding, watch plts closely  - Per message with Dr. Serafina Royals, Heparin drip to be held on call to IR for bone marrow biopsy scheduled for 2/16   Leone Haven, PharmD 02/04/2023 3:19 AM

## 2023-02-05 DIAGNOSIS — C9200 Acute myeloblastic leukemia, not having achieved remission: Secondary | ICD-10-CM

## 2023-02-05 DIAGNOSIS — C3430 Malignant neoplasm of lower lobe, unspecified bronchus or lung: Secondary | ICD-10-CM | POA: Diagnosis not present

## 2023-02-05 DIAGNOSIS — D61818 Other pancytopenia: Secondary | ICD-10-CM | POA: Diagnosis not present

## 2023-02-05 LAB — COMPREHENSIVE METABOLIC PANEL
ALT: 18 U/L (ref 0–44)
AST: 20 U/L (ref 15–41)
Albumin: 3.2 g/dL — ABNORMAL LOW (ref 3.5–5.0)
Alkaline Phosphatase: 61 U/L (ref 38–126)
Anion gap: 9 (ref 5–15)
BUN: 6 mg/dL — ABNORMAL LOW (ref 8–23)
CO2: 24 mmol/L (ref 22–32)
Calcium: 8.5 mg/dL — ABNORMAL LOW (ref 8.9–10.3)
Chloride: 102 mmol/L (ref 98–111)
Creatinine, Ser: 0.72 mg/dL (ref 0.61–1.24)
GFR, Estimated: >60 mL/min (ref >60)
Glucose, Bld: 97 mg/dL (ref 70–99)
Potassium: 3.6 mmol/L (ref 3.5–5.1)
Sodium: 135 mmol/L (ref 135–145)
Total Bilirubin: 0.7 mg/dL (ref 0.3–1.2)
Total Protein: 5.6 g/dL — ABNORMAL LOW (ref 6.5–8.1)

## 2023-02-05 LAB — CBC WITH DIFFERENTIAL/PLATELET
Abs Immature Granulocytes: 0 10*3/uL (ref 0.00–0.07)
Abs Immature Granulocytes: 0 10*3/uL (ref 0.00–0.07)
Basophils Absolute: 0 10*3/uL (ref 0.0–0.1)
Basophils Absolute: 0 10*3/uL (ref 0.0–0.1)
Basophils Relative: 1 %
Basophils Relative: 1 %
Eosinophils Absolute: 0 10*3/uL (ref 0.0–0.5)
Eosinophils Absolute: 0 10*3/uL (ref 0.0–0.5)
Eosinophils Relative: 2 %
Eosinophils Relative: 2 %
HCT: 27.5 % — ABNORMAL LOW (ref 39.0–52.0)
HCT: 23.9 % — ABNORMAL LOW (ref 39.0–52.0)
Hemoglobin: 9.4 g/dL — ABNORMAL LOW (ref 13.0–17.0)
Hemoglobin: 8.8 g/dL — ABNORMAL LOW (ref 13.0–17.0)
Immature Granulocytes: 0 %
Immature Granulocytes: 0 %
Lymphocytes Relative: 71 %
Lymphocytes Relative: 65 %
Lymphs Abs: 1.1 10*3/uL (ref 0.7–4.0)
Lymphs Abs: 0.8 10*3/uL (ref 0.7–4.0)
MCH: 38.5 pg — ABNORMAL HIGH (ref 26.0–34.0)
MCH: 40.6 pg — ABNORMAL HIGH (ref 26.0–34.0)
MCHC: 34.2 g/dL (ref 30.0–36.0)
MCHC: 36.8 g/dL — ABNORMAL HIGH (ref 30.0–36.0)
MCV: 112.7 fL — ABNORMAL HIGH (ref 80.0–100.0)
MCV: 110.1 fL — ABNORMAL HIGH (ref 80.0–100.0)
Monocytes Absolute: 0.2 10*3/uL (ref 0.1–1.0)
Monocytes Absolute: 0.2 10*3/uL (ref 0.1–1.0)
Monocytes Relative: 12 %
Monocytes Relative: 16 %
Neutro Abs: 0.2 10*3/uL — CL (ref 1.7–7.7)
Neutro Abs: 0.2 10*3/uL — CL (ref 1.7–7.7)
Neutrophils Relative %: 14 %
Neutrophils Relative %: 16 %
Platelets: 38 10*3/uL — ABNORMAL LOW (ref 150–400)
Platelets: 29 10*3/uL — CL (ref 150–400)
RBC: 2.44 MIL/uL — ABNORMAL LOW (ref 4.22–5.81)
RBC: 2.17 MIL/uL — ABNORMAL LOW (ref 4.22–5.81)
RDW: 15.4 % (ref 11.5–15.5)
RDW: 15.3 % (ref 11.5–15.5)
WBC: 1.6 10*3/uL — ABNORMAL LOW (ref 4.0–10.5)
WBC: 1.2 10*3/uL — CL (ref 4.0–10.5)
nRBC: 0 % (ref 0.0–0.2)
nRBC: 0 % (ref 0.0–0.2)

## 2023-02-05 LAB — HEPARIN LEVEL (UNFRACTIONATED): Heparin Unfractionated: 0.37 IU/mL (ref 0.30–0.70)

## 2023-02-05 LAB — TYPE AND SCREEN
ABO/RH(D): O POS
Antibody Screen: NEGATIVE

## 2023-02-05 LAB — MAGNESIUM: Magnesium: 2 mg/dL (ref 1.7–2.4)

## 2023-02-05 LAB — PHOSPHORUS: Phosphorus: 3.4 mg/dL (ref 2.5–4.6)

## 2023-02-05 MED ORDER — HYDROCORTISONE 1 % EX CREA
TOPICAL_CREAM | Freq: Four times a day (QID) | CUTANEOUS | Status: DC
  Administered 2023-02-05 – 2023-02-06 (×2): 1 via TOPICAL
  Filled 2023-02-05: qty 28

## 2023-02-05 NOTE — Progress Notes (Signed)
Patient c/o itching on back. Patient has red bumps on back that are raised. Hydrocort. Cream applied with relief.

## 2023-02-05 NOTE — Progress Notes (Signed)
Unfortunately, the flow cytometry came back on the bone marrow aspirate.  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  It is a little unusual to see thrombocytopenia but yet thromboembolic disease.  I suspect that the thromboembolic disease is more related to an anatomic issue with his subclavian vein from past radiation therapy that he took about 17 years ago.  He was moved over to [facility] for a stent procedure on Monday.  His blood count today shows white count 1.2.  Hemoglobin 8.8.  Platelet count 29,000.  I looked at his blood smear.  He does have some myeloblasts.  I think what was important is that I do not see any promyelocytes.  As such, I do not think this is to be APL.  He looks like he is in good shape.  He really is not bothered by the pancytopenia for the most part.  He does have leukopenia/neutropenia.  He does have anemia.  Does have thrombocytopenia.  He is already is not bleeding.  He is on heparin.  I see no problems with him being on the heparin.  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  I think what is critical are the genetic studies and the DNA markers.  These intake a week or so before they come back.  Typically, we do not treat acute myeloid leukemia in the community.  I typically send my patients out to [facility] for therapy if they need induction therapy.  At his age, he may not be a candidate for aggressive induction therapy.  He may be a candidate for Vidaza/venetoclax.  Again, getting the DNA studies-particularly IDH mutation analysis will be helpful.  I do think that he can be sent over as an outpatient to the [facility].  I think he needs to have the subclavian stent placed first.  I think it is incredibly interesting that he had chemotherapy for his lung cancer about 17 years ago.  As such, that could be playing a  role with respect to him developing leukemia now.  If so, I would have to think that this would be much more aggressive type of leukemia that might be more resilient to her therapy.  He and his wife are very nice.  They have very good understanding of what is going on.  Again, I think he needs to have the subclavian vein issue taken care of while he is at [facility].  I will notify Dr. Julien Nordmann of the new development.  Lattie Haw, MD  Jeneen Rinks 1:5

## 2023-02-05 NOTE — Progress Notes (Signed)
PROGRESS NOTE    [name]  HDQ:[id number] DOB: 1946-01-27 DOA: 02/02/2023 PCP: Jodene Nam, MD    Brief Narrative:  77 year old gentleman with history of lung cancer status post radiation therapy, history of prior subclavian thrombosis status post stenting in 2007, COPD, hyperlipidemia, hypertension, ongoing alcohol use presented to the [facility] long hospital with left arm swelling and left eye swelling for the last 3 to 4 days with shortness of breath.  Symptoms ongoing for more than a month.  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  Recently diagnosed pancytopenia. On admission seen by oncology at [facility].  Transferred to [facility] for scheduled subclavian stenting.  Underwent bone marrow biopsy 2/16. Results consistent with acute leukemia.   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  Currently patient is medically stable. Case discussed with oncology.  Currently not pursuing emergency transfer to leukemia center.  Hematology oncology will make a determination.  Continue supportive care.  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  Platelets are 29,000, no evidence of bleeding.  Will continue. 2/15, underwent venogram, planning to do subclavian stenting on 2/19 by IR.  Alcoholism with risk of withdrawal: On multivitamins.  On CIWA protocol with benzodiazepine.  No evidence of alcohol withdrawal so far.  Essential hypertension: On Coreg 3.125.  Stable.  History of lung cancer: On surveillance.  Reportedly inactive since 2017.  OSA: Noncompliant.  Case dicussed with Oncology Dr Marin Olp after seeing results on chart review.  Discussed in detail with the patient needs emergent transfer to leukemia center.  Oncology to initiate an emergent transfer if needed.  Currently advised to continue monitoring.  DVT  prophylaxis: Heparin infusion.   Code Status: Full code Family Communication: Wife at the bedside Disposition Plan: Status is: Inpatient Remains inpatient appropriate because: Severe pancytopenia, inpatient procedures planned     Consultants:  Interventional radiology Oncology  Procedures:  Bone marrow biopsy 2/16  Antimicrobials:  None   Subjective: Patient seen and examined in the morning rounds.  Patient himself denied any complaints.  Wife at the bedside.  Denied any nausea vomiting, hemoptysis or any bleeding episodes.  Tolerating heparin.  Mobilizing around.  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 Immediately contacted oncology on-call physician Dr. Marin Olp and we went through patient's clinicals in details.  See discussion above.  Objective: Vitals:   02/04/23 1915 02/04/23 2316 02/05/23 0329 02/05/23 0845  BP: (!) 154/70 (!) 158/81 (!) 145/68 139/75  Pulse: 93   86  Resp: 20 18 16    Temp: 98.9 F (37.2 C) 98.3 F (36.8 C) 98.8 F (37.1 C) 98.4 F (36.9 C)  TempSrc: Oral Oral Axillary Oral  SpO2: 97% 98% 97% 92%  Weight: 92.3 kg     Height: 5\' 9"  (1.753 m)       Intake/Output Summary (Last 24 hours) at 02/05/2023 1121 Last data filed at 02/05/2023 0900 Gross per 24 hour  Intake 704.61 ml  Output 400 ml  Net 304.61 ml   Filed Weights   02/02/23 1120 02/04/23 1915  Weight: 92.3 kg 92.3 kg    Examination:  General exam: Appears calm and comfortable  Alert awake and oriented.  Pleasant to conversation. Respiratory system: Clear to auscultation. Respiratory effort normal.  No  added sounds. Cardiovascular system: S1 & S2 heard, RRR. No pedal edema. Gastrointestinal system: Abdomen is nondistended, soft and nontender. No organomegaly or masses felt. Normal bowel sounds heard. Central nervous system: Alert and oriented. No focal  neurological deficits. Extremities: Symmetric 5 x 5 power. Skin:  Diffuse edema of the left upper extremity with some edema around the eyes.   Data Reviewed: I have personally reviewed following labs and imaging studies  CBC: Recent Labs  Lab 02/02/23 1135 02/02/23 1325 02/03/23 0529 02/04/23 0153 02/05/23 0700  WBC 1.5* 1.4* 1.3* 1.6* 1.2*  NEUTROABS  --  0.3* 0.1* 0.2* 0.2*  HGB 10.8* 10.6* 9.1* 9.4* 8.8*  HCT 30.2* 30.0* 26.9* 27.5* 23.9*  MCV 110.2* 111.5* 115.9* 112.7* 110.1*  PLT 41* 42* 35* 38* 29*   Basic Metabolic Panel: Recent Labs  Lab 02/02/23 1135 02/02/23 2255 02/03/23 0529 02/04/23 0153 02/05/23 0700  NA 136  --  136 135 135  K 4.4  --  3.6 3.6 3.6  CL 103  --  103 101 102  CO2 23  --  26 25 24   GLUCOSE 101*  --  98 103* 97  BUN 8  --  9 9 6*  CREATININE 0.57*  --  0.60* 0.73 0.72  CALCIUM 9.5  --  8.7* 8.5* 8.5*  MG  --  2.1 2.0 2.0 2.0  PHOS  --  3.3 3.9 3.4 3.4   GFR: Estimated Creatinine Clearance: 89.5 mL/min (by C-G formula based on SCr of 0.72 mg/dL). Liver Function Tests: Recent Labs  Lab 02/02/23 1135 02/03/23 0529 02/04/23 0153 02/05/23 0700  AST 16 16 18 20   ALT 14 16 17 18   ALKPHOS 69 57 61 61  BILITOT 0.6 0.9 0.8 0.7  PROT 6.7 6.0* 6.3* 5.6*  ALBUMIN 4.1 3.2* 3.6 3.2*   No results for input(s): "LIPASE", "AMYLASE" in the last 168 hours. No results for input(s): "AMMONIA" in the last 168 hours. Coagulation Profile: Recent Labs  Lab 02/02/23 1248  INR 1.0   Cardiac Enzymes: Recent Labs  Lab 02/02/23 2255  CKTOTAL 54   BNP (last 3 results) No results for input(s): "PROBNP" in the last 8760 hours. HbA1C: No results for input(s): "HGBA1C" in the last 72 hours. CBG: No results for input(s): "GLUCAP" in the last 168 hours. Lipid Profile: No results for input(s): "CHOL", "HDL", "LDLCALC", "TRIG", "CHOLHDL", "LDLDIRECT" in the last 72 hours. Thyroid Function Tests: Recent Labs    02/02/23 2255  TSH 2.035   Anemia  Panel: Recent Labs    02/02/23 2255  VITAMINB12 383  FOLATE 31.3  FERRITIN 405*  TIBC 298  IRON 191*  RETICCTPCT 1.5   Sepsis Labs: Recent Labs  Lab 02/03/23 0024 02/03/23 0529  LATICACIDVEN 1.2 0.8    No results found for this or any previous visit (from the past 240 hour(s)).       Radiology Studies: CT BONE MARROW BIOPSY & ASPIRATION  Result Date: 02/04/2023 INDICATION: 77 year old male with history of pain status presenting for bone marrow biopsy. EXAM: CT-GUIDED BONE MARROW BIOPSY AND ASPIRATION MEDICATIONS: None ANESTHESIA/SEDATION: Fentanyl 100 mcg IV; Versed 2 mg IV Sedation Time: 10 minutes; The patient was continuously monitored during the procedure by the interventional radiology nurse under my direct supervision. COMPLICATIONS: None immediate. PROCEDURE: Informed consent was obtained from the patient following an explanation of the procedure, risks, benefits and alternatives. The patient understands, agrees and consents for the procedure. All questions were addressed. A time out was performed prior to  the initiation of the procedure. The patient was positioned prone and non-contrast localization CT was performed of the pelvis to demonstrate the iliac marrow spaces. The operative site was prepped and draped in the usual sterile fashion. Under sterile conditions and local anesthesia, a 22 gauge spinal needle was utilized for procedural planning. Next, an 11 gauge coaxial bone biopsy needle was advanced into the right iliac marrow space. Needle position was confirmed with CT imaging. Initially, a bone marrow aspiration was performed. Next, a bone marrow biopsy was obtained with the 11 gauge outer bone marrow device. Samples were prepared with the cytotechnologist and deemed adequate. The needle was removed and superficial hemostasis was obtained with manual compression. A dressing was applied. The patient tolerated the procedure well without immediate post procedural  complication. IMPRESSION: Successful CT guided right iliac bone marrow aspiration and core biopsy. Ruthann Cancer, MD [facility] Electronically Signed   By: Ruthann Cancer M.D.   On: 02/04/2023 10:04   IR Veno/Ext/Uni Left  Result Date: 02/03/2023 INDICATION: 77 year old male with suspected left subclavian stent occlusion. EXAM: 1. Ultrasound-guided vascular access of the left upper extremity 2. Left upper extremity venogram COMPARISON:  None Available. MEDICATIONS: None. ANESTHESIA/SEDATION: None. FLUOROSCOPY TIME:  Fifty-eight mGy COMPLICATIONS: None immediate. CONTRAST:  20 mL Omnipaque 300, intravenous TECHNIQUE: Informed written consent was obtained from the patient after a thorough discussion of the procedural risks, benefits and alternatives. All questions were addressed. Maximal Sterile Barrier Technique was utilized including caps, mask, sterile gowns, sterile gloves, sterile drape, hand hygiene and skin antiseptic. A timeout was performed prior to the initiation of the procedure. Left upper extremity was prepped and draped in standard fashion. Preprocedure ultrasound evaluation demonstrated patency of a prominent left brachial vein. Of note, there are multiple subcutaneous varicosities in the left upper extremity. Procedure was planned. Subdermal Local anesthesia was provided at the planned needle entry site. A small skin nick was made. Under direct ultrasound visualization, the left brachial artery was accessed with a 20 gauge micropuncture needle. A permanent ultrasound image was captured and stored in the record. Micropuncture sheath was then introduced and left upper extremity venogram was performed. Venogram was significant for patent basilic and axillary veins. There is flush occlusion of the indwelling left subclavian stents. There are multiple chest wall collateral veins which appear mature. FINDINGS: Occluded indwelling left subclavian  vein stents. IMPRESSION: Occluded indwelling left subclavian vein stents. Chronicity is indeterminate, though suspected to have chronic component. PLAN: IR will arrange for possible stent recanalization. Ruthann Cancer, MD [facility] Radiology Electronically Signed   By: Ruthann Cancer M.D.   On: 02/03/2023 17:09   IR US Guide Vasc Access Left  Result Date: 02/03/2023 INDICATION: 77 year old male with suspected left subclavian stent occlusion. EXAM: 1. Ultrasound-guided vascular access of the left upper extremity 2. Left upper extremity venogram COMPARISON:  None Available. MEDICATIONS: None. ANESTHESIA/SEDATION: None. FLUOROSCOPY TIME:  Fifty-eight mGy COMPLICATIONS: None immediate. CONTRAST:  20 mL Omnipaque 300, intravenous TECHNIQUE: Informed written consent was obtained from the patient after a thorough discussion of the procedural risks, benefits and alternatives. All questions were addressed. Maximal Sterile Barrier Technique was utilized including caps, mask, sterile gowns, sterile gloves, sterile drape, hand hygiene and skin antiseptic. A timeout was performed prior to the initiation of the procedure. Left upper extremity was prepped and draped in standard fashion. Preprocedure ultrasound evaluation demonstrated patency of a prominent left brachial vein. Of note, there are multiple subcutaneous  varicosities in the left upper extremity. Procedure was planned. Subdermal Local anesthesia was provided at the planned needle entry site. A small skin nick was made. Under direct ultrasound visualization, the left brachial artery was accessed with a 20 gauge micropuncture needle. A permanent ultrasound image was captured and stored in the record. Micropuncture sheath was then introduced and left upper extremity venogram was performed. Venogram was significant for patent basilic and axillary veins. There is flush occlusion of the indwelling left subclavian stents.  There are multiple chest wall collateral veins which appear mature. FINDINGS: Occluded indwelling left subclavian vein stents. IMPRESSION: Occluded indwelling left subclavian vein stents. Chronicity is indeterminate, though suspected to have chronic component. PLAN: IR will arrange for possible stent recanalization. Ruthann Cancer, MD [facility] Radiology Electronically Signed   By: Ruthann Cancer M.D.   On: 02/03/2023 17:09        Scheduled Meds:  carvedilol  3.125 mg Oral BID WC   folic acid  1 mg Oral Daily   latanoprost  1 drop Both Eyes QHS   multivitamin with minerals  1 tablet Oral Daily   rosuvastatin  10 mg Oral Daily   sodium chloride flush  10-40 mL Intracatheter Q12H   thiamine  100 mg Oral Daily   Or   thiamine  100 mg Intravenous Daily   umeclidinium bromide  1 puff Inhalation Daily   Continuous Infusions:  heparin 1,600 Units/hr (02/05/23 0929)     LOS: 3 days    Time spent: 35 minutes    Barb Merino, MD [facility] Pager [phone]

## 2023-02-05 NOTE — Progress Notes (Signed)
ANTICOAGULATION CONSULT NOTE - Follow Up Consult  Pharmacy Consult for heparin Indication: hx left arm subclavian vein thrombosis and now acute left subclavian vein thrombus (PTA Eliquis on hold)  No Known Allergies   Patient Measurements: Height: 5\' 9"  (175.3 cm) Weight: 92.3 kg (203 lb 7.8 oz) IBW/kg (Calculated) : 70.7 Heparin Dosing Weight: 90 kg  Vital Signs: Temp: 98.4 F (36.9 C) (02/17 0845) Temp Source: Oral (02/17 0845) BP: 139/75 (02/17 0845) Pulse Rate: 86 (02/17 0845)  Labs: Recent Labs    02/02/23 1248 02/02/23 1325 02/02/23 2255 02/03/23 0529 02/03/23 0733 02/04/23 0153 02/05/23 0700  HGB  --    < >  --  9.1*  --  9.4* 8.8*  HCT  --    < >  --  26.9*  --  27.5* 23.9*  PLT  --    < >  --  35*  --  38* 29*  APTT  --   --  52*  --  86* 87*  --   LABPROT 13.4  --   --   --   --   --   --   INR 1.0  --   --   --   --   --   --   HEPARINUNFRC  --    < > 0.86*  --  0.71* 0.50 0.37  CREATININE  --   --   --  0.60*  --  0.73 0.72  CKTOTAL  --   --  54  --   --   --   --    < > = values in this interval not displayed.     Estimated Creatinine Clearance: 89.5 mL/min (by C-G formula based on SCr of 0.72 mg/dL).   Medications:  - on Eliquis 2.5 mg bid (last dose taken on 02/02/23 at [place]). Pt's wife reported that he is compliance with this medication and has not missed any doses at home.  Assessment: Patient's a 77 y.o with hx NSCLC and subclavian vein thrombosis in 2007 who presented to the ED on 02/02/26 with c/o left arm swelling.  Pt's wife reported that he was started on warfarin after the LUE DVT in 2007 and switched over to Eliquis by his PCP last year.  Left UE doppler on 02/02/23 showed "thrombus within the left subclavian vein."  He's currently on heparin drip for VTE treatment.  Heparin level today is therapeutic at 0.37, on 1600 units/hr. Hgb 8.8, plts 38 to 29 from yesterday--low but continuing heparin drip, per Dr. Sloan Leiter. No line issues or  signs/symptoms of bleeding per nurse.   Goal of Therapy:  Heparin level 0.3-0.7 units/ml aPTT 66-102 seconds Monitor platelets by anticoagulation protocol: Yes   Plan:  - continue heparin drip at 1600 units/hr  - check daily heparin level and CBC - monitor for s/sx bleeding, watch plts closely    Billey Gosling, PharmD PGY1 Pharmacy Resident 2/17/20248:53 AM

## 2023-02-06 ENCOUNTER — Encounter (HOSPITAL_COMMUNITY): Payer: Self-pay | Admitting: Internal Medicine

## 2023-02-06 DIAGNOSIS — C9200 Acute myeloblastic leukemia, not having achieved remission: Secondary | ICD-10-CM | POA: Diagnosis not present

## 2023-02-06 DIAGNOSIS — C3430 Malignant neoplasm of lower lobe, unspecified bronchus or lung: Secondary | ICD-10-CM | POA: Diagnosis not present

## 2023-02-06 DIAGNOSIS — D61818 Other pancytopenia: Secondary | ICD-10-CM | POA: Diagnosis not present

## 2023-02-06 LAB — COMPREHENSIVE METABOLIC PANEL WITH GFR
ALT: 25 U/L (ref 0–44)
AST: 38 U/L (ref 15–41)
Albumin: 3.3 g/dL — ABNORMAL LOW (ref 3.5–5.0)
Alkaline Phosphatase: 67 U/L (ref 38–126)
Anion gap: 9 (ref 5–15)
BUN: 8 mg/dL (ref 8–23)
CO2: 25 mmol/L (ref 22–32)
Calcium: 9.1 mg/dL (ref 8.9–10.3)
Chloride: 101 mmol/L (ref 98–111)
Creatinine, Ser: 0.77 mg/dL (ref 0.61–1.24)
GFR, Estimated: >60 mL/min
Glucose, Bld: 96 mg/dL (ref 70–99)
Potassium: 4.5 mmol/L (ref 3.5–5.1)
Sodium: 135 mmol/L (ref 135–145)
Total Bilirubin: 0.8 mg/dL (ref 0.3–1.2)
Total Protein: 6 g/dL — ABNORMAL LOW (ref 6.5–8.1)

## 2023-02-06 LAB — CBC WITH DIFFERENTIAL/PLATELET
Abs Immature Granulocytes: 0 K/uL (ref 0.00–0.07)
Basophils Absolute: 0 K/uL (ref 0.0–0.1)
Basophils Relative: 1 %
Eosinophils Absolute: 0 K/uL (ref 0.0–0.5)
Eosinophils Relative: 2 %
HCT: 26.6 % — ABNORMAL LOW (ref 39.0–52.0)
Hemoglobin: 9.3 g/dL — ABNORMAL LOW (ref 13.0–17.0)
Immature Granulocytes: 0 %
Lymphocytes Relative: 67 %
Lymphs Abs: 0.9 K/uL (ref 0.7–4.0)
MCH: 39.2 pg — ABNORMAL HIGH (ref 26.0–34.0)
MCHC: 35 g/dL (ref 30.0–36.0)
MCV: 112.2 fL — ABNORMAL HIGH (ref 80.0–100.0)
Monocytes Absolute: 0.2 K/uL (ref 0.1–1.0)
Monocytes Relative: 14 %
Neutro Abs: 0.2 K/uL — CL (ref 1.7–7.7)
Neutrophils Relative %: 16 %
Platelets: 32 K/uL — ABNORMAL LOW (ref 150–400)
RBC: 2.37 MIL/uL — ABNORMAL LOW (ref 4.22–5.81)
RDW: 15.5 % (ref 11.5–15.5)
WBC: 1.3 K/uL — CL (ref 4.0–10.5)
nRBC: 0 % (ref 0.0–0.2)

## 2023-02-06 LAB — HEPARIN LEVEL (UNFRACTIONATED): Heparin Unfractionated: 0.3 IU/mL (ref 0.30–0.70)

## 2023-02-06 LAB — PHOSPHORUS: Phosphorus: 3.9 mg/dL (ref 2.5–4.6)

## 2023-02-06 LAB — MAGNESIUM: Magnesium: 2.1 mg/dL (ref 1.7–2.4)

## 2023-02-06 MED ORDER — DIPHENHYDRAMINE HCL 25 MG PO CAPS
25.0000 mg | ORAL_CAPSULE | Freq: Three times a day (TID) | ORAL | Status: DC | PRN
  Administered 2023-02-06 (×2): 25 mg via ORAL
  Filled 2023-02-06 (×3): qty 1

## 2023-02-06 MED ORDER — LIP MEDEX EX OINT
TOPICAL_OINTMENT | CUTANEOUS | Status: DC | PRN
  Administered 2023-02-06: 75 via TOPICAL
  Filled 2023-02-06: qty 7

## 2023-02-06 NOTE — Progress Notes (Signed)
PROGRESS NOTE    [name]  BTD:[id number] DOB: 1946-02-14 DOA: 02/02/2023 PCP: Jodene Nam, MD    Brief Narrative:  77 year old gentleman with history of lung cancer status post radiation therapy, history of prior subclavian thrombosis status post stenting in 2007, COPD, hyperlipidemia, hypertension, ongoing alcohol use presented to the [facility] long hospital with left arm swelling and left eye swelling for the last 3 to 4 days with shortness of breath.  Symptoms ongoing for more than a month.  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  Recently diagnosed pancytopenia. On admission seen by oncology at [facility].  Transferred to [facility] for scheduled subclavian stenting.  Underwent bone marrow biopsy 2/16. Results consistent with acute leukemia.   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  Followed by oncology.  Currently not pursuing emergency transfer to leukemia center.  Hematology oncology will make a determination.  Continue supportive care.  Blood counts remained stable.  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  Platelets are 31 K.  No evidence of bleeding.  Will continue. 2/15, underwent venogram, planning to do subclavian stenting on 2/19 by IR.  Alcoholism with risk of withdrawal: On multivitamins.  On CIWA protocol with benzodiazepine.  No evidence of alcohol withdrawal so far.  Essential hypertension: On Coreg 3.125.  Stable.  History of lung cancer: On surveillance.  Reportedly inactive since 2017.  OSA: Noncompliant.  DVT prophylaxis: Heparin infusion.   Code Status: Full code Family Communication: None today morning rounds. Disposition Plan: Status is: Inpatient Remains inpatient appropriate because: Severe pancytopenia, inpatient procedures planned     Consultants:  Interventional radiology Oncology  Procedures:  Bone  marrow biopsy 2/16  Antimicrobials:  None   Subjective: Patient seen and examined.  No overnight events except he had trouble with cardiac monitor that keeps making noise. Tolerating heparin.  No bleeding.  Objective: Vitals:   02/06/23 0352 02/06/23 0750 02/06/23 0812 02/06/23 0922  BP: 121/62 (!) 156/67  (!) 142/68  Pulse: 84 84  87  Resp: 15 18    Temp: 98.2 F (36.8 C) 98.2 F (36.8 C)    TempSrc: Oral Oral    SpO2: 97% 97% 97%   Weight:      Height:        Intake/Output Summary (Last 24 hours) at 02/06/2023 1155 Last data filed at 02/06/2023 0353 Gross per 24 hour  Intake 720 ml  Output 3 ml  Net 717 ml   Filed Weights   02/02/23 1120 02/04/23 1915  Weight: 92.3 kg 92.3 kg    Examination:  General exam: Appears calm and comfortable.  Pleasant to conversation.  On room air. Respiratory system: Clear to auscultation. Respiratory effort normal.  No added sounds. Cardiovascular system: S1 & S2 heard, RRR. No pedal edema. Gastrointestinal system: Abdomen is nondistended, soft and nontender. No organomegaly or masses felt. Normal bowel sounds heard. Central nervous system: Alert and oriented. No focal neurological deficits. Extremities: Symmetric 5 x 5 power. Skin:  Diffuse edema of the left upper extremity .  Distal pulses are palpable.   Data Reviewed: I have personally reviewed following labs and imaging studies  CBC: Recent Labs  Lab 02/02/23 1325 02/03/23 0529 02/04/23 0153 02/05/23 0700 02/06/23 0417  WBC 1.4* 1.3* 1.6* 1.2* 1.3*  NEUTROABS 0.3* 0.1* 0.2* 0.2* 0.2*  HGB 10.6* 9.1* 9.4* 8.8* 9.3*  HCT 30.0* 26.9* 27.5* 23.9*  26.6*  MCV 111.5* 115.9* 112.7* 110.1* 112.2*  PLT 42* 35* 38* 29* 32*   Basic Metabolic Panel: Recent Labs  Lab 02/02/23 1135 02/02/23 2255 02/03/23 0529 02/04/23 0153 02/05/23 0700 02/06/23 0417  NA 136  --  136 135 135 135  K 4.4  --  3.6 3.6 3.6 4.5  CL 103  --  103 101 102 101  CO2 23  --  26 25 24 25   GLUCOSE  101*  --  98 103* 97 96  BUN 8  --  9 9 6* 8  CREATININE 0.57*  --  0.60* 0.73 0.72 0.77  CALCIUM 9.5  --  8.7* 8.5* 8.5* 9.1  MG  --  2.1 2.0 2.0 2.0 2.1  PHOS  --  3.3 3.9 3.4 3.4 3.9   GFR: Estimated Creatinine Clearance: 89.5 mL/min (by C-G formula based on SCr of 0.77 mg/dL). Liver Function Tests: Recent Labs  Lab 02/02/23 1135 02/03/23 0529 02/04/23 0153 02/05/23 0700 02/06/23 0417  AST 16 16 18 20  38  ALT 14 16 17 18 25   ALKPHOS 69 57 61 61 67  BILITOT 0.6 0.9 0.8 0.7 0.8  PROT 6.7 6.0* 6.3* 5.6* 6.0*  ALBUMIN 4.1 3.2* 3.6 3.2* 3.3*   No results for input(s): "LIPASE", "AMYLASE" in the last 168 hours. No results for input(s): "AMMONIA" in the last 168 hours. Coagulation Profile: Recent Labs  Lab 02/02/23 1248  INR 1.0   Cardiac Enzymes: Recent Labs  Lab 02/02/23 2255  CKTOTAL 54   BNP (last 3 results) No results for input(s): "PROBNP" in the last 8760 hours. HbA1C: No results for input(s): "HGBA1C" in the last 72 hours. CBG: No results for input(s): "GLUCAP" in the last 168 hours. Lipid Profile: No results for input(s): "CHOL", "HDL", "LDLCALC", "TRIG", "CHOLHDL", "LDLDIRECT" in the last 72 hours. Thyroid Function Tests: No results for input(s): "TSH", "T4TOTAL", "FREET4", "T3FREE", "THYROIDAB" in the last 72 hours.  Anemia Panel: No results for input(s): "VITAMINB12", "FOLATE", "FERRITIN", "TIBC", "IRON", "RETICCTPCT" in the last 72 hours.  Sepsis Labs: Recent Labs  Lab 02/03/23 0024 02/03/23 0529  LATICACIDVEN 1.2 0.8    No results found for this or any previous visit (from the past 240 hour(s)).       Radiology Studies: No results found.      Scheduled Meds:  carvedilol  3.125 mg Oral BID WC   folic acid  1 mg Oral Daily   latanoprost  1 drop Both Eyes QHS   multivitamin with minerals  1 tablet Oral Daily   rosuvastatin  10 mg Oral Daily   sodium chloride flush  10-40 mL Intracatheter Q12H   thiamine  100 mg Oral Daily   Or    thiamine  100 mg Intravenous Daily   umeclidinium bromide  1 puff Inhalation Daily   Continuous Infusions:  heparin 1,600 Units/hr (02/06/23 0122)     LOS: 4 days    Time spent: 35 minutes    Barb Merino, MD [facility] Pager [phone]

## 2023-02-06 NOTE — Progress Notes (Signed)
ANTICOAGULATION CONSULT NOTE - Follow Up Consult  Pharmacy Consult for heparin Indication: hx left arm subclavian vein thrombosis and now acute left subclavian vein thrombus (PTA Eliquis on hold)  No Known Allergies   Patient Measurements: Height: 5\' 9"  (175.3 cm) Weight: 92.3 kg (203 lb 7.8 oz) IBW/kg (Calculated) : 70.7 Heparin Dosing Weight: 90 kg  Vital Signs: Temp: 98.2 F (36.8 C) (02/18 0352) Temp Source: Oral (02/18 0352) BP: 121/62 (02/18 0352) Pulse Rate: 84 (02/18 0352)  Labs: Recent Labs    02/03/23 0733 02/03/23 0733 02/04/23 0153 02/05/23 0700 02/06/23 0417  HGB  --    < > 9.4* 8.8* 9.3*  HCT  --   --  27.5* 23.9* 26.6*  PLT  --   --  38* 29* 32*  APTT 86*  --  87*  --   --   HEPARINUNFRC 0.71*  --  0.50 0.37 0.30  CREATININE  --   --  0.73 0.72 0.77   < > = values in this interval not displayed.     Estimated Creatinine Clearance: 89.5 mL/min (by C-G formula based on SCr of 0.77 mg/dL).   Medications:  - on Eliquis 2.5 mg bid (last dose taken on 02/02/23 at [place]). Pt's wife reported that he is compliance with this medication and has not missed any doses at home.  Assessment: Patient's a 77 y.o with hx NSCLC and subclavian vein thrombosis in 2007 who presented to the ED on 02/02/26 with c/o left arm swelling.  Pt's wife reported that he was started on warfarin after the LUE DVT in 2007 and switched over to Eliquis by his PCP last year.  Left UE doppler on 02/02/23 showed "thrombus within the left subclavian vein."  He's currently on heparin drip for VTE treatment.  Heparin level today remains therapeutic at 0.30, on 1600 units/hr. Hgb 9.3, plts 29 to 32 from yesterday--low but continuing heparin drip, per MD. No line issues or signs/symptoms of bleeding reported. Plan for subclavian stenting on 2/19.    Goal of Therapy:  Heparin level 0.3-0.7 units/ml aPTT 66-102 seconds Monitor platelets by anticoagulation protocol: Yes   Plan:  - continue heparin  drip at 1600 units/hr  - check daily heparin level and CBC - monitor for s/sx bleeding, watch plts closely    Billey Gosling, PharmD PGY1 Pharmacy Resident 2/18/20247:01 AM

## 2023-02-06 NOTE — Anesthesia Preprocedure Evaluation (Signed)
Anesthesia Evaluation  Patient identified by MRN, date of birth, ID band Patient awake    Reviewed: Allergy & Precautions, NPO status , Patient's Chart, lab work & pertinent test results  Airway Mallampati: III  TM Distance: >3 FB Neck ROM: Full    Dental  (+) Teeth Intact, Dental Advisory Given   Pulmonary sleep apnea , COPD, Patient abstained from smoking., former smoker   breath sounds clear to auscultation       Cardiovascular hypertension,  Rhythm:Regular Rate:Normal     Neuro/Psych  Neuromuscular disease  negative psych ROS   GI/Hepatic negative GI ROS, Neg liver ROS,,,  Endo/Other  negative endocrine ROS    Renal/GU negative Renal ROS     Musculoskeletal negative musculoskeletal ROS (+)    Abdominal   Peds  Hematology negative hematology ROS (+)   Anesthesia Other Findings   Reproductive/Obstetrics                             Anesthesia Physical Anesthesia Plan  ASA: 3  Anesthesia Plan: General   Post-op Pain Management: Tylenol PO (pre-op)*   Induction: Intravenous  PONV Risk Score and Plan: 3 and Ondansetron, Dexamethasone and Midazolam  Airway Management Planned: Oral ETT  Additional Equipment: Arterial line  Intra-op Plan:   Post-operative Plan: Extubation in OR  Informed Consent: I have reviewed the patients History and Physical, chart, labs and discussed the procedure including the risks, benefits and alternatives for the proposed anesthesia with the patient or authorized representative who has indicated his/her understanding and acceptance.     Dental advisory given  Plan Discussed with: CRNA  Anesthesia Plan Comments:        Anesthesia Quick Evaluation

## 2023-02-07 ENCOUNTER — Encounter (HOSPITAL_COMMUNITY)
Admission: EM | Disposition: A | Discharge status: Other Acute Inpatient Hospital | Payer: Self-pay | Source: Home / Self Care | Attending: Internal Medicine

## 2023-02-07 ENCOUNTER — Inpatient Hospital Stay (HOSPITAL_COMMUNITY): Payer: Medicare Other | Admitting: Critical Care Medicine

## 2023-02-07 ENCOUNTER — Encounter (HOSPITAL_COMMUNITY): Payer: Self-pay | Admitting: Internal Medicine

## 2023-02-07 ENCOUNTER — Encounter (HOSPITAL_COMMUNITY): Payer: Self-pay

## 2023-02-07 ENCOUNTER — Inpatient Hospital Stay (HOSPITAL_COMMUNITY): Payer: Medicare Other

## 2023-02-07 DIAGNOSIS — C9200 Acute myeloblastic leukemia, not having achieved remission: Secondary | ICD-10-CM | POA: Diagnosis not present

## 2023-02-07 DIAGNOSIS — J449 Chronic obstructive pulmonary disease, unspecified: Secondary | ICD-10-CM

## 2023-02-07 DIAGNOSIS — Z87891 Personal history of nicotine dependence: Secondary | ICD-10-CM

## 2023-02-07 DIAGNOSIS — I871 Compression of vein: Secondary | ICD-10-CM

## 2023-02-07 DIAGNOSIS — I10 Essential (primary) hypertension: Secondary | ICD-10-CM

## 2023-02-07 HISTORY — PX: IR VENO/EXT/BI: IMG677

## 2023-02-07 HISTORY — PX: IR US GUIDE VASC ACCESS LEFT: IMG2389

## 2023-02-07 HISTORY — PX: RADIOLOGY WITH ANESTHESIA: SHX6223

## 2023-02-07 HISTORY — PX: IR US GUIDE VASC ACCESS RIGHT: IMG2390

## 2023-02-07 HISTORY — PX: IR PTA VENOUS EXCEPT DIALYSIS CIRCUIT: IMG6126

## 2023-02-07 LAB — COMPREHENSIVE METABOLIC PANEL
ALT: 30 U/L (ref 0–44)
AST: 29 U/L (ref 15–41)
Albumin: 3.2 g/dL — ABNORMAL LOW (ref 3.5–5.0)
Alkaline Phosphatase: 66 U/L (ref 38–126)
Anion gap: 12 (ref 5–15)
BUN: 8 mg/dL (ref 8–23)
CO2: 22 mmol/L (ref 22–32)
Calcium: 8.7 mg/dL — ABNORMAL LOW (ref 8.9–10.3)
Chloride: 102 mmol/L (ref 98–111)
Creatinine, Ser: 0.74 mg/dL (ref 0.61–1.24)
GFR, Estimated: >60 mL/min (ref >60)
Glucose, Bld: 103 mg/dL — ABNORMAL HIGH (ref 70–99)
Potassium: 3.8 mmol/L (ref 3.5–5.1)
Sodium: 136 mmol/L (ref 135–145)
Total Bilirubin: 0.8 mg/dL (ref 0.3–1.2)
Total Protein: 5.6 g/dL — ABNORMAL LOW (ref 6.5–8.1)

## 2023-02-07 LAB — CBC WITH DIFFERENTIAL/PLATELET
Abs Immature Granulocytes: 0.01 10*3/uL (ref 0.00–0.07)
Basophils Absolute: 0 10*3/uL (ref 0.0–0.1)
Basophils Relative: 1 %
Eosinophils Absolute: 0 10*3/uL (ref 0.0–0.5)
Eosinophils Relative: 1 %
HCT: 23.6 % — ABNORMAL LOW (ref 39.0–52.0)
Hemoglobin: 8.6 g/dL — ABNORMAL LOW (ref 13.0–17.0)
Immature Granulocytes: 1 %
Lymphocytes Relative: 61 %
Lymphs Abs: 0.8 10*3/uL (ref 0.7–4.0)
MCH: 41 pg — ABNORMAL HIGH (ref 26.0–34.0)
MCHC: 36.4 g/dL — ABNORMAL HIGH (ref 30.0–36.0)
MCV: 112.4 fL — ABNORMAL HIGH (ref 80.0–100.0)
Monocytes Absolute: 0.2 10*3/uL (ref 0.1–1.0)
Monocytes Relative: 20 %
Neutro Abs: 0.2 10*3/uL — CL (ref 1.7–7.7)
Neutrophils Relative %: 16 %
Platelets: 28 10*3/uL — CL (ref 150–400)
RBC: 2.1 MIL/uL — ABNORMAL LOW (ref 4.22–5.81)
RDW: 15.7 % — ABNORMAL HIGH (ref 11.5–15.5)
Smear Review: DECREASED
WBC: 1.2 10*3/uL — CL (ref 4.0–10.5)
nRBC: 0 % (ref 0.0–0.2)

## 2023-02-07 LAB — HEPARIN LEVEL (UNFRACTIONATED): Heparin Unfractionated: 0.24 IU/mL — ABNORMAL LOW (ref 0.30–0.70)

## 2023-02-07 LAB — LACTATE DEHYDROGENASE: LDH: 150 U/L (ref 98–192)

## 2023-02-07 LAB — VITAMIN B1: Vitamin B1 (Thiamine): 170.3 nmol/L (ref 66.5–200.0)

## 2023-02-07 LAB — URIC ACID: Uric Acid, Serum: 4.9 mg/dL (ref 3.7–8.6)

## 2023-02-07 SURGERY — RADIOLOGY WITH ANESTHESIA
Anesthesia: General

## 2023-02-07 MED ORDER — HEPARIN (PORCINE) 25000 UT/250ML-% IV SOLN: 1700.0000 [IU]/h | INTRAVENOUS | Status: DC

## 2023-02-07 MED ORDER — CARVEDILOL 3.125 MG PO TABS
3.1250 mg | ORAL_TABLET | Freq: Two times a day (BID) | ORAL | Status: DC
  Administered 2023-02-07 – 2023-02-08 (×2): 3.125 mg via ORAL
  Filled 2023-02-07 (×2): qty 1

## 2023-02-07 MED ORDER — AMISULPRIDE (ANTIEMETIC) 5 MG/2ML IV SOLN: 10.0000 mg | Freq: Once | INTRAVENOUS | Status: DC | PRN

## 2023-02-07 MED ORDER — PROMETHAZINE HCL 25 MG/ML IJ SOLN: 6.2500 mg | INTRAMUSCULAR | Status: DC | PRN

## 2023-02-07 MED ORDER — DEXAMETHASONE SODIUM PHOSPHATE 10 MG/ML IJ SOLN
INTRAMUSCULAR | Status: DC | PRN
  Administered 2023-02-07: 4 mg via INTRAVENOUS

## 2023-02-07 MED ORDER — FENTANYL CITRATE (PF) 100 MCG/2ML IJ SOLN: 25.0000 ug | INTRAMUSCULAR | Status: DC | PRN

## 2023-02-07 MED ORDER — OXYCODONE HCL 5 MG/5ML PO SOLN: 5.0000 mg | Freq: Once | ORAL | Status: DC | PRN

## 2023-02-07 MED ORDER — CHLORHEXIDINE GLUCONATE 0.12 % MT SOLN: 15.0000 mL | Freq: Once | OROMUCOSAL | Status: AC

## 2023-02-07 MED ORDER — LACTATED RINGERS IV SOLN: INTRAVENOUS | Status: DC | PRN

## 2023-02-07 MED ORDER — PHENYLEPHRINE 80 MCG/ML (10ML) SYRINGE FOR IV PUSH (FOR BLOOD PRESSURE SUPPORT)
PREFILLED_SYRINGE | INTRAVENOUS | Status: DC | PRN
  Administered 2023-02-07: 80 ug via INTRAVENOUS

## 2023-02-07 MED ORDER — ACETAMINOPHEN 500 MG PO TABS
ORAL_TABLET | ORAL | Status: AC
  Filled 2023-02-07: qty 2

## 2023-02-07 MED ORDER — ACETAMINOPHEN 10 MG/ML IV SOLN: 1000.0000 mg | Freq: Once | INTRAVENOUS | Status: DC | PRN

## 2023-02-07 MED ORDER — UMECLIDINIUM BROMIDE 62.5 MCG/ACT IN AEPB
1.0000 | INHALATION_SPRAY | Freq: Every day | RESPIRATORY_TRACT | Status: DC
  Administered 2023-02-07 – 2023-02-08 (×2): 1 via RESPIRATORY_TRACT
  Filled 2023-02-07: qty 7

## 2023-02-07 MED ORDER — PROPOFOL 10 MG/ML IV BOLUS
INTRAVENOUS | Status: DC | PRN
  Administered 2023-02-07: 30 mg via INTRAVENOUS
  Administered 2023-02-07: 120 mg via INTRAVENOUS

## 2023-02-07 MED ORDER — ACETAMINOPHEN 160 MG/5ML PO SOLN: 325.0000 mg | ORAL | Status: DC | PRN

## 2023-02-07 MED ORDER — LACTATED RINGERS IV SOLN: INTRAVENOUS | Status: DC

## 2023-02-07 MED ORDER — CHLORHEXIDINE GLUCONATE CLOTH 2 % EX PADS
6.0000 | MEDICATED_PAD | Freq: Every day | CUTANEOUS | Status: DC
  Administered 2023-02-07 – 2023-02-08 (×2): 6 via TOPICAL

## 2023-02-07 MED ORDER — ORAL CARE MOUTH RINSE: 15.0000 mL | Freq: Once | OROMUCOSAL | Status: AC

## 2023-02-07 MED ORDER — SUGAMMADEX SODIUM 200 MG/2ML IV SOLN
INTRAVENOUS | Status: DC | PRN
  Administered 2023-02-07: 200 mg via INTRAVENOUS

## 2023-02-07 MED ORDER — SUCCINYLCHOLINE CHLORIDE 200 MG/10ML IV SOSY
PREFILLED_SYRINGE | INTRAVENOUS | Status: DC | PRN
  Administered 2023-02-07: 70 mg via INTRAVENOUS

## 2023-02-07 MED ORDER — CHLORHEXIDINE GLUCONATE 0.12 % MT SOLN
OROMUCOSAL | Status: AC
  Administered 2023-02-07: 15 mL via OROMUCOSAL
  Filled 2023-02-07: qty 15

## 2023-02-07 MED ORDER — LIDOCAINE HCL 1 % IJ SOLN
INTRAMUSCULAR | Status: AC
  Filled 2023-02-07: qty 20

## 2023-02-07 MED ORDER — CARVEDILOL 3.125 MG PO TABS
3.1250 mg | ORAL_TABLET | Freq: Once | ORAL | Status: AC
  Administered 2023-02-07: 3.125 mg via ORAL
  Filled 2023-02-07: qty 1

## 2023-02-07 MED ORDER — FENTANYL CITRATE (PF) 100 MCG/2ML IJ SOLN
INTRAMUSCULAR | Status: DC | PRN
  Administered 2023-02-07 (×2): 50 ug via INTRAVENOUS

## 2023-02-07 MED ORDER — THIAMINE MONONITRATE 100 MG PO TABS
100.0000 mg | ORAL_TABLET | Freq: Every day | ORAL | Status: DC
  Administered 2023-02-07 – 2023-02-08 (×2): 100 mg via ORAL
  Filled 2023-02-07 (×2): qty 1

## 2023-02-07 MED ORDER — ROCURONIUM BROMIDE 10 MG/ML (PF) SYRINGE
PREFILLED_SYRINGE | INTRAVENOUS | Status: DC | PRN
  Administered 2023-02-07: 50 mg via INTRAVENOUS
  Administered 2023-02-07: 20 mg via INTRAVENOUS

## 2023-02-07 MED ORDER — IOHEXOL 300 MG/ML  SOLN
150.0000 mL | Freq: Once | INTRAMUSCULAR | Status: AC | PRN
  Administered 2023-02-07: 50 mL via INTRAVENOUS

## 2023-02-07 MED ORDER — FOLIC ACID 1 MG PO TABS
1.0000 mg | ORAL_TABLET | Freq: Every day | ORAL | Status: DC
  Administered 2023-02-07 – 2023-02-08 (×2): 1 mg via ORAL
  Filled 2023-02-07 (×2): qty 1

## 2023-02-07 MED ORDER — MIDAZOLAM HCL 5 MG/5ML IJ SOLN
INTRAMUSCULAR | Status: DC | PRN
  Administered 2023-02-07 (×2): 1 mg via INTRAVENOUS

## 2023-02-07 MED ORDER — ADULT MULTIVITAMIN W/MINERALS CH
1.0000 | ORAL_TABLET | Freq: Every day | ORAL | Status: DC
  Administered 2023-02-07 – 2023-02-08 (×2): 1 via ORAL
  Filled 2023-02-07 (×2): qty 1

## 2023-02-07 MED ORDER — ROSUVASTATIN CALCIUM 5 MG PO TABS
10.0000 mg | ORAL_TABLET | Freq: Every day | ORAL | Status: DC
  Administered 2023-02-07 – 2023-02-08 (×2): 10 mg via ORAL
  Filled 2023-02-07 (×2): qty 2

## 2023-02-07 MED ORDER — OXYCODONE HCL 5 MG PO TABS: 5.0000 mg | ORAL_TABLET | Freq: Once | ORAL | Status: DC | PRN

## 2023-02-07 MED ORDER — THIAMINE HCL 100 MG/ML IJ SOLN: 100.0000 mg | Freq: Every day | INTRAMUSCULAR | Status: DC

## 2023-02-07 MED ORDER — ACETAMINOPHEN 325 MG PO TABS: 325.0000 mg | ORAL_TABLET | ORAL | Status: DC | PRN

## 2023-02-07 MED ORDER — UMECLIDINIUM BROMIDE 62.5 MCG/ACT IN AEPB
1.0000 | INHALATION_SPRAY | Freq: Every day | RESPIRATORY_TRACT | Status: DC
  Filled 2023-02-07: qty 7

## 2023-02-07 MED ORDER — PHENYLEPHRINE HCL-NACL 20-0.9 MG/250ML-% IV SOLN
INTRAVENOUS | Status: DC | PRN
  Administered 2023-02-07: 25 ug/min via INTRAVENOUS

## 2023-02-07 NOTE — Progress Notes (Signed)
PROGRESS NOTE    [name]  FKC:[id number] DOB: November 22, 1946 DOA: 02/02/2023 PCP: Jodene Nam, MD    Brief Narrative:  77 year old gentleman with history of lung cancer status post radiation therapy, history of prior subclavian thrombosis status post stenting in 2007, COPD, hyperlipidemia, hypertension, ongoing alcohol use presented to the [facility] long hospital with left arm swelling and left eye swelling for the last 3 to 4 days with shortness of breath.  Symptoms ongoing for more than a month.  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  Recently diagnosed pancytopenia. On admission seen by oncology at [facility].  Transferred to [facility] for scheduled subclavian stenting.  Underwent bone marrow biopsy 2/16. Results consistent with acute leukemia.   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  Followed by oncology.  Currently not pursuing emergency transfer to leukemia center.  Hematology oncology will make a determination.  Continue supportive care.  Blood counts remain stable.  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  Platelets are 28K.  No evidence of bleeding.  Will continue. 2/15, underwent venogram 2/19, scheduled for venogram with angioplasty to open flow limitation. 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  Alcoholism with risk of withdrawal: On multivitamins.  On CIWA protocol with benzodiazepine.  No evidence of alcohol withdrawal .  Essential hypertension: On Coreg 3.125.  Stable.  History of lung cancer: On surveillance.  Reportedly inactive since 2017.  OSA: Noncompliant.  Currently not needing.  DVT prophylaxis: Heparin infusion.   Code Status: Full code Family  Communication:  Disposition Plan: Status is: Inpatient Remains inpatient appropriate because: Severe pancytopenia, inpatient procedures planned     Consultants:  Interventional radiology Oncology  Procedures:  Bone marrow biopsy 2/16  Antimicrobials:  None   Subjective:  Patient seen and examined.  Came back from procedure.  Wife at the bedside.  Denies any complaints.  He has mild bleeding on the left brachial access.  Apparently he had to have midline, a central line and arterial line in the OR.  No more rashes or itching.  Objective: Vitals:   02/06/23 1653 02/06/23 2144 02/07/23 0354 02/07/23 0747  BP: (!) 148/75 (!) 155/85 (!) 121/50 (!) 167/89  Pulse: 80 85 84 86  Resp: 15 20 20 19   Temp: 98.4 F (36.9 C) 98.4 F (36.9 C) 98.3 F (36.8 C) 98.5 F (36.9 C)  TempSrc: Oral Oral Oral Oral  SpO2: 97% 100% 92% 95%  Weight:      Height:        Intake/Output Summary (Last 24 hours) at 02/07/2023 1055 Last data filed at 02/07/2023 1020 Gross per 24 hour  Intake 850 ml  Output --  Net 850 ml    Filed Weights   02/02/23 1120 02/04/23 1915  Weight: 92.3 kg 92.3 kg    Examination:  General: Looks fairly comfortable.  Laying in bed.  On room air. Cardiovascular: S1-S2 normal.  Regular rate rhythm. Respiratory: Bilateral clear.  No added sounds. Gastrointestinal: Soft.  Nontender. Ext: Diffuse edema of the left arm.  Minor bleeding on the dressing left brachial access.  Distal neurovascular status intact. Neuro: Intact.   Data Reviewed: I have personally reviewed following labs and imaging studies  CBC: Recent Labs  Lab 02/03/23 0529  02/04/23 0153 02/05/23 0700 02/06/23 0417 02/07/23 0456  WBC 1.3* 1.6* 1.2* 1.3* 1.2*  NEUTROABS 0.1* 0.2* 0.2* 0.2* 0.2*  HGB 9.1* 9.4* 8.8* 9.3* 8.6*  HCT 26.9* 27.5* 23.9* 26.6* 23.6*  MCV 115.9* 112.7* 110.1* 112.2* 112.4*  PLT 35* 38* 29* 32* 28*    Basic Metabolic Panel: Recent Labs  Lab 02/02/23 2255  02/03/23 0529 02/04/23 0153 02/05/23 0700 02/06/23 0417 02/07/23 0456  NA  --  136 135 135 135 136  K  --  3.6 3.6 3.6 4.5 3.8  CL  --  103 101 102 101 102  CO2  --  26 25 24 25 22   GLUCOSE  --  98 103* 97 96 103*  BUN  --  9 9 6* 8 8  CREATININE  --  0.60* 0.73 0.72 0.77 0.74  CALCIUM  --  8.7* 8.5* 8.5* 9.1 8.7*  MG 2.1 2.0 2.0 2.0 2.1  --   PHOS 3.3 3.9 3.4 3.4 3.9  --     GFR: Estimated Creatinine Clearance: 89.5 mL/min (by C-G formula based on SCr of 0.74 mg/dL). Liver Function Tests: Recent Labs  Lab 02/03/23 0529 02/04/23 0153 02/05/23 0700 02/06/23 0417 02/07/23 0456  AST 16 18 20  38 29  ALT 16 17 18 25 30   ALKPHOS 57 61 61 67 66  BILITOT 0.9 0.8 0.7 0.8 0.8  PROT 6.0* 6.3* 5.6* 6.0* 5.6*  ALBUMIN 3.2* 3.6 3.2* 3.3* 3.2*    No results for input(s): "LIPASE", "AMYLASE" in the last 168 hours. No results for input(s): "AMMONIA" in the last 168 hours. Coagulation Profile: Recent Labs  Lab 02/02/23 1248  INR 1.0    Cardiac Enzymes: Recent Labs  Lab 02/02/23 2255  CKTOTAL 54    BNP (last 3 results) No results for input(s): "PROBNP" in the last 8760 hours. HbA1C: No results for input(s): "HGBA1C" in the last 72 hours. CBG: No results for input(s): "GLUCAP" in the last 168 hours. Lipid Profile: No results for input(s): "CHOL", "HDL", "LDLCALC", "TRIG", "CHOLHDL", "LDLDIRECT" in the last 72 hours. Thyroid Function Tests: No results for input(s): "TSH", "T4TOTAL", "FREET4", "T3FREE", "THYROIDAB" in the last 72 hours.  Anemia Panel: No results for input(s): "VITAMINB12", "FOLATE", "FERRITIN", "TIBC", "IRON", "RETICCTPCT" in the last 72 hours.  Sepsis Labs: Recent Labs  Lab 02/03/23 0024 02/03/23 0529  LATICACIDVEN 1.2 0.8     No results found for this or any previous visit (from the past 240 hour(s)).       Radiology Studies: No results found.      Scheduled Meds:  acetaminophen       [MAR Hold] carvedilol  3.125 mg Oral BID WC    [MAR Hold] folic acid  1 mg Oral Daily   [MAR Hold] latanoprost  1 drop Both Eyes QHS   lidocaine       [MAR Hold] multivitamin with minerals  1 tablet Oral Daily   [MAR Hold] rosuvastatin  10 mg Oral Daily   [MAR Hold] sodium chloride flush  10-40 mL Intracatheter Q12H   [MAR Hold] thiamine  100 mg Oral Daily   Or   [MAR Hold] thiamine  100 mg Intravenous Daily   [MAR Hold] umeclidinium bromide  1 puff Inhalation Daily   Continuous Infusions:  acetaminophen     heparin 1,600 Units/hr (02/07/23 0941)   lactated ringers 10 mL/hr at 02/07/23 0749     LOS: 5 days    Time spent: 35 minutes    Barb Merino, MD  [facility] Pager [phone]

## 2023-02-07 NOTE — Progress Notes (Signed)
From the incision site in the left brachial this RN found increment in drainage from previously marked drainage from PACU,applied pressure for 10 min and dressing changed,will continue to monitor

## 2023-02-07 NOTE — H&P (Signed)
Chief Complaint: Patient was seen in consultation today for  Chief Complaint  Patient presents with   Arm Swelling    Referring Physician(s): Dr. Roel Cluck   Supervising Physician: Ruthann Cancer  Patient Status: [facility] - In-pt  History of Present Illness:  [name] is a 77 y.o. 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 He is familiar to IR from prior treatments including thrombolysis and stent placement in 2007. He remains on anticoagulation.    He presented to the ED 02/02/23 with complaints of left arm swelling. He was admitted for acute thrombosis of the left subclavian vein and worsening pancytopenia. He presented to IR 02/03/23 for a left upper extremity venogram and this study showed total occlusion of the indwelling left subclavian stents with multiple mature appearing collaterals.   IR recommended transferring the patient to [facility] for left subclavian stent recanalization under general anesthesia. There was agreement from the patient and other members of the healthcare team to proceed.   The patient also underwent bone marrow biopsy with aspiration on 02/04/23 for further work up of pancytopenia. Preliminary pathology results show AML.   Past Medical History:  Diagnosis Date   Adenocarcinoma of right lung, stage 1 ([place]) 03/03/2017   Cancer ([place])    lung cancer 2005   Cancer [facility])    skin cancer, mouth cancer   COPD (chronic obstructive pulmonary disease) (HCC)    Glaucoma    Hereditary and idiopathic peripheral neuropathy 09/26/2014   Hyperlipidemia    Hypertension    Ischemic colitis ([place])    Left subclavian vein thrombosis (HCC)    Polio    Sleep apnea    no CPAP    Past Surgical History:  Procedure Laterality Date   HERNIA  REPAIR     left ingunal   IR US GUIDE VASC ACCESS LEFT  02/03/2023   IR VENO/EXT/UNI LEFT  02/03/2023   LUNG REMOVAL, PARTIAL Left    SEGMENTECOMY Right 01/12/2017   Procedure: RIGHT SUPERIOR SEGMENTECTOMY;  Surgeon: Melrose Nakayama, MD;  Location: [place];  Service: Thoracic;  Laterality: Right;   SUBCLAVIAN STENT PLACEMENT     TONSILLECTOMY     VIDEO ASSISTED THORACOSCOPY Right 01/12/2017   Procedure: VIDEO ASSISTED THORACOSCOPY;  Surgeon: Melrose Nakayama, MD;  Location: [place];  Service: Thoracic;  Laterality: Right;    Allergies: Patient has no known allergies.  Medications: Prior to Admission medications   Medication Sig Start Date End Date Taking? Authorizing Provider  albuterol (VENTOLIN HFA) 108 (90 Base) MCG/ACT inhaler Inhale 1-2 puffs into the lungs every 4 (four) hours as needed for wheezing or shortness of breath.   Yes [provider]  Bacillus Coagulans-Inulin (PROBIOTIC) 1-250 BILLION-MG CAPS Take 1 capsule by mouth daily.   Yes [provider]  Brimonidine Tartrate (LUMIFY) 0.025 % SOLN Place 1 drop into both eyes daily as needed (redness).   Yes [provider]  Calcium-Phosphorus-Vitamin D (CALCIUM/D3 ADULT GUMMIES PO) Take 1 tablet by mouth 2 (two) times daily. Gummy has 500 mg of calcium, D3 1000 units, zinc 7 mg   Yes [provider]  Carboxymethylcellulose Sodium (REFRESH LIQUIGEL) 1 % GEL Place 1 drop into both eyes 2 (two) times daily as needed (dryness).   Yes [provider]  carvedilol (COREG)  3.125 MG tablet Take 3.125 mg by mouth 2 (two) times daily with a meal.   Yes [provider]  ELIQUIS 2.5 MG TABS tablet Take 2.5 mg by mouth 2 (two) times daily. 06/09/22  Yes [provider]  latanoprost (XALATAN) 0.005 % ophthalmic solution Place 1 drop into both eyes at bedtime.   Yes [provider]  loperamide (IMODIUM A-D) 2 MG tablet Take 2 mg by mouth as needed for diarrhea or loose stools.    Yes [provider]  losartan (COZAAR) 50 MG tablet Take 50 mg by mouth daily. 04/25/21  Yes [provider]  Multiple Vitamin (MULTIVITAMIN WITH MINERALS) TABS tablet Take 1 tablet by mouth 2 (two) times daily.   Yes [provider]  rosuvastatin (CRESTOR) 10 MG tablet Take 10 mg by mouth daily. 07/10/20  Yes [provider]  SPIRIVA HANDIHALER 18 MCG inhalation capsule Place 18 mcg into inhaler and inhale daily. 08/26/20  Yes [provider]     Family History  Problem Relation Age of Onset   Cancer Mother        intestine   Hyperlipidemia Mother    Heart Problems Brother    Heart attack Brother 39       s/p CABG and redo 5 years later   Heart attack Father    Heart disease Father    Colon cancer Maternal Grandmother    Colon cancer Maternal Aunt     Social History   Socioeconomic History   Marital status: Married    Spouse name: Not on file   Number of children: 1   Years of education: BS   Highest education level: Not on file  Occupational History   Occupation: Retired    Fish farm manager: RETIRED   Occupation: Scientist, research (physical sciences)  Tobacco Use   Smoking status: Former    Packs/day: 2.00    Years: 40.00    Total pack years: 80.00    Types: Cigarettes    Quit date: 2004    Years since quitting: 20.1   Smokeless tobacco: Never  Vaping Use   Vaping Use: Never used  Substance and Sexual Activity   Alcohol use: Yes    Alcohol/week: 42.0 standard drinks of alcohol    Types: 42 Glasses of wine per week    Comment: night- Vodka 4 to five drinks a night   Drug use: No   Sexual activity: Not on file  Other Topics Concern   Not on file  Social History Narrative   Not on file   Social Determinants of Health   Financial Resource Strain: Not on file  Food Insecurity: No Food Insecurity (02/02/2023)   Hunger Vital Sign    Worried About Running Out of Food in the Last Year: Never true    Ran Out of Food in the Last Year: Never true   Transportation Needs: No Transportation Needs (02/02/2023)   PRAPARE - Hydrologist (Medical): No    Lack of Transportation (Non-Medical): No  Physical Activity: Not on file  Stress: Not on file  Social Connections: Not on file    Review of Systems: A 12 point ROS discussed and pertinent positives are indicated in the HPI above.  All other systems are negative.  Review of Systems  Constitutional:  Negative for appetite change and fatigue.  Respiratory:  Negative for cough and shortness of breath.   Cardiovascular:  Negative for chest pain and leg swelling.  Gastrointestinal:  Negative for  abdominal pain, diarrhea, nausea and vomiting.  Musculoskeletal:  Negative for back pain.       Left upper extremity swelling  Neurological:  Negative for dizziness and headaches.    Vital Signs: BP (!) 121/50 (BP Location: Right Leg)   Pulse 84   Temp 98.3 F (36.8 C) (Oral)   Resp 20   Ht 5\' 9"  (1.753 m)   Wt 203 lb 7.8 oz (92.3 kg)   SpO2 92%   BMI 30.05 kg/m   Physical Exam Constitutional:      General: He is not in acute distress.    Appearance: He is not ill-appearing.  HENT:     Mouth/Throat:     Mouth: Mucous membranes are moist.     Pharynx: Oropharynx is clear.  Cardiovascular:     Rate and Rhythm: Normal rate and regular rhythm.     Pulses: Normal pulses.     Heart sounds: Normal heart sounds.  Pulmonary:     Effort: Pulmonary effort is normal.     Breath sounds: Normal breath sounds.  Abdominal:     General: Bowel sounds are normal.     Palpations: Abdomen is soft.     Tenderness: There is no abdominal tenderness.  Musculoskeletal:        General: Swelling present.     Right lower leg: No edema.     Left lower leg: No edema.     Comments: Left upper extremity swelling.   Skin:    General: Skin is warm and dry.  Neurological:     Mental Status: He is alert and oriented to person, place, and time.     Imaging: CT BONE MARROW  BIOPSY & ASPIRATION  Result Date: 02/04/2023 INDICATION: 77 year old male with history of pain status presenting for bone marrow biopsy. EXAM: CT-GUIDED BONE MARROW BIOPSY AND ASPIRATION MEDICATIONS: None ANESTHESIA/SEDATION: Fentanyl 100 mcg IV; Versed 2 mg IV Sedation Time: 10 minutes; The patient was continuously monitored during the procedure by the interventional radiology nurse under my direct supervision. COMPLICATIONS: None immediate. PROCEDURE: Informed consent was obtained from the patient following an explanation of the procedure, risks, benefits and alternatives. The patient understands, agrees and consents for the procedure. All questions were addressed. A time out was performed prior to the initiation of the procedure. The patient was positioned prone and non-contrast localization CT was performed of the pelvis to demonstrate the iliac marrow spaces. The operative site was prepped and draped in the usual sterile fashion. Under sterile conditions and local anesthesia, a 22 gauge spinal needle was utilized for procedural planning. Next, an 11 gauge coaxial bone biopsy needle was advanced into the right iliac marrow space. Needle position was confirmed with CT imaging. Initially, a bone marrow aspiration was performed. Next, a bone marrow biopsy was obtained with the 11 gauge outer bone marrow device. Samples were prepared with the cytotechnologist and deemed adequate. The needle was removed and superficial hemostasis was obtained with manual compression. A dressing was applied. The patient tolerated the procedure well without immediate post procedural complication. IMPRESSION: Successful CT guided right iliac bone marrow aspiration and core biopsy. Ruthann Cancer, MD [facility] Radiology Electronically Signed   By: Ruthann Cancer M.D.   On: 02/04/2023 10:04   IR Veno/Ext/Uni Left  Result Date: 02/03/2023 INDICATION: 77 year old male with suspected left  subclavian stent occlusion. EXAM: 1. Ultrasound-guided vascular access of the left upper extremity 2. Left upper extremity venogram COMPARISON:  None Available. MEDICATIONS: None.  ANESTHESIA/SEDATION: None. FLUOROSCOPY TIME:  Fifty-eight mGy COMPLICATIONS: None immediate. CONTRAST:  20 mL Omnipaque 300, intravenous TECHNIQUE: Informed written consent was obtained from the patient after a thorough discussion of the procedural risks, benefits and alternatives. All questions were addressed. Maximal Sterile Barrier Technique was utilized including caps, mask, sterile gowns, sterile gloves, sterile drape, hand hygiene and skin antiseptic. A timeout was performed prior to the initiation of the procedure. Left upper extremity was prepped and draped in standard fashion. Preprocedure ultrasound evaluation demonstrated patency of a prominent left brachial vein. Of note, there are multiple subcutaneous varicosities in the left upper extremity. Procedure was planned. Subdermal Local anesthesia was provided at the planned needle entry site. A small skin nick was made. Under direct ultrasound visualization, the left brachial artery was accessed with a 20 gauge micropuncture needle. A permanent ultrasound image was captured and stored in the record. Micropuncture sheath was then introduced and left upper extremity venogram was performed. Venogram was significant for patent basilic and axillary veins. There is flush occlusion of the indwelling left subclavian stents. There are multiple chest wall collateral veins which appear mature. FINDINGS: Occluded indwelling left subclavian vein stents. IMPRESSION: Occluded indwelling left subclavian vein stents. Chronicity is indeterminate, though suspected to have chronic component. PLAN: IR will arrange for possible stent recanalization. Ruthann Cancer, MD [facility] Radiology Electronically Signed   By: Ruthann Cancer M.D.   On: 02/03/2023  17:09   IR US Guide Vasc Access Left  Result Date: 02/03/2023 INDICATION: 76 year old male with suspected left subclavian stent occlusion. EXAM: 1. Ultrasound-guided vascular access of the left upper extremity 2. Left upper extremity venogram COMPARISON:  None Available. MEDICATIONS: None. ANESTHESIA/SEDATION: None. FLUOROSCOPY TIME:  Fifty-eight mGy COMPLICATIONS: None immediate. CONTRAST:  20 mL Omnipaque 300, intravenous TECHNIQUE: Informed written consent was obtained from the patient after a thorough discussion of the procedural risks, benefits and alternatives. All questions were addressed. Maximal Sterile Barrier Technique was utilized including caps, mask, sterile gowns, sterile gloves, sterile drape, hand hygiene and skin antiseptic. A timeout was performed prior to the initiation of the procedure. Left upper extremity was prepped and draped in standard fashion. Preprocedure ultrasound evaluation demonstrated patency of a prominent left brachial vein. Of note, there are multiple subcutaneous varicosities in the left upper extremity. Procedure was planned. Subdermal Local anesthesia was provided at the planned needle entry site. A small skin nick was made. Under direct ultrasound visualization, the left brachial artery was accessed with a 20 gauge micropuncture needle. A permanent ultrasound image was captured and stored in the record. Micropuncture sheath was then introduced and left upper extremity venogram was performed. Venogram was significant for patent basilic and axillary veins. There is flush occlusion of the indwelling left subclavian stents. There are multiple chest wall collateral veins which appear mature. FINDINGS: Occluded indwelling left subclavian vein stents. IMPRESSION: Occluded indwelling left subclavian vein stents. Chronicity is indeterminate, though suspected to have chronic component. PLAN: IR will arrange for possible stent recanalization. Ruthann Cancer, MD [facility]  [facility]iology [facility] Radiology Electronically Signed   By: Ruthann Cancer M.D.   On: 02/03/2023 17:09   US Abdomen Limited RUQ (LIVER/GB)  Result Date: 02/03/2023 CLINICAL DATA:  100250 Liver disease 100250 EXAM: ULTRASOUND ABDOMEN LIMITED RIGHT UPPER QUADRANT COMPARISON:  10/11/2018 FINDINGS: Gallbladder: No gallstones, pericholecystic fluid or wall thickening visualized. Common bile duct: Diameter: 0.4 cm. Liver: Liver is hyperechoic consistent with fatty infiltration. No focal hepatic lesions identified. No intrahepatic ductal  dilatation. Hepatopetal portal vein. IMPRESSION: Hepatic fatty infiltration. Otherwise unremarkable examination of the right upper quadrant. Electronically Signed   By: Sammie Bench M.D.   On: 02/03/2023 06:35   CT Angio Chest Pulmonary Embolism (PE) W or WO Contrast  Result Date: 02/03/2023 CLINICAL DATA:  Pulmonary embolism suspected, high probability. Shortness of breath getting worse for months. Left arm swelling. Left subclavian stent. History of lung cancer and partial left lung removal. COPD. EXAM: CT ANGIOGRAPHY CHEST WITH CONTRAST TECHNIQUE: Multidetector CT imaging of the chest was performed using the standard protocol during bolus administration of intravenous contrast. Multiplanar CT image reconstructions and MIPs were obtained to evaluate the vascular anatomy. RADIATION DOSE REDUCTION: This exam was performed according to the departmental dose-optimization program which includes automated exposure control, adjustment of the mA and/or kV according to patient size and/or use of iterative reconstruction technique. CONTRAST:  1mL OMNIPAQUE IOHEXOL 350 MG/ML SOLN COMPARISON:  09/20/2022. FINDINGS: Cardiovascular: The heart is normal in size and there is no pericardial effusion. Multi-vessel coronary artery calcifications are noted. There is atherosclerotic calcification of the aorta without evidence of aneurysm. The pulmonary trunk is normal in  caliber. No pulmonary artery filling defect is identified. Examination is limited due to respiratory motion and mixing artifact. Left subclavian stent is in place, however patency can not be determined due to timing of contrast bolus. Mediastinum/Nodes: No mediastinal, hilar, or axillary lymphadenopathy. The thyroid gland, trachea, and esophagus are within normal limits. Lungs/Pleura: Paraseptal and centrilobular emphysematous changes are present in the lungs. Postsurgical changes are noted at the left hilum with reduced lung volume on the left. A stable cystic lesion is noted in the perihilar region of the left lower lobe. No effusion or pneumothorax. A 3 mm nodule is present in the right middle lobe, unchanged. Upper Abdomen: No acute abnormality. Musculoskeletal: The bony structures are stable. No acute or suspicious osseous abnormality. Review of the MIP images confirms the above findings. IMPRESSION: 1. No evidence of pulmonary embolism or other acute process. 2. Stable postsurgical changes in the left upper lobe. 3. Emphysema. 4. Aortic atherosclerosis and coronary artery calcifications. Electronically Signed   By: Brett Fairy M.D.   On: 02/03/2023 00:22   US Venous Img Upper Uni Left  Result Date: 02/02/2023 CLINICAL DATA:  Left upper extremity swelling for 3-4 days. EXAM: LEFT UPPER EXTREMITY VENOUS DOPPLER ULTRASOUND TECHNIQUE: Gray-scale sonography with graded compression, as well as color Doppler and duplex ultrasound were performed to evaluate the upper extremity deep venous system from the level of the subclavian vein and including the jugular, axillary, basilic, radial, ulnar and upper cephalic vein. Spectral Doppler was utilized to evaluate flow at rest and with distal augmentation maneuvers. COMPARISON:  None Available. FINDINGS: Contralateral Subclavian Vein: Respiratory phasicity is normal and symmetric with the symptomatic side. No evidence of thrombus. Normal compressibility. Internal  Jugular Vein: No evidence of thrombus. Normal compressibility, respiratory phasicity and response to augmentation. Subclavian Vein: Thrombus identified within the left subclavian. Axillary Vein: No evidence of thrombus. Normal compressibility, respiratory phasicity and response to augmentation. Cephalic Vein: No evidence of thrombus. Normal compressibility, respiratory phasicity and response to augmentation. Basilic Vein: No evidence of thrombus. Normal compressibility, respiratory phasicity and response to augmentation. Brachial Veins: No evidence of thrombus. Normal compressibility, respiratory phasicity and response to augmentation. Radial Veins: No evidence of thrombus. Normal compressibility, respiratory phasicity and response to augmentation. Ulnar Veins: No evidence of thrombus. Normal compressibility, respiratory phasicity and response to augmentation. Venous Reflux:  None visualized.  Other Findings:  None visualized. IMPRESSION: Exam positive for thrombus within the left subclavian vein. These results were called by telephone at the time of interpretation on 02/02/2023 at 1:37 pm to provider [facility] , who verbally acknowledged these results. Electronically Signed   By: Kerby Moors M.D.   On: 02/02/2023 13:39   DG Chest 2 View  Result Date: 02/02/2023 CLINICAL DATA:  Shortness of breath. EXAM: CHEST - 2 VIEW COMPARISON:  January 03, 2018. FINDINGS: The heart size and mediastinal contours are within normal limits. Old left rib fractures are noted. Minimal right perihilar subsegmental atelectasis or scarring is noted. Left lung is unremarkable. IMPRESSION: Minimal right perihilar subsegmental atelectasis or scarring. Electronically Signed   By: Marijo Conception M.D.   On: 02/02/2023 12:05    Labs:  CBC: Recent Labs    02/03/23 0529 02/04/23 0153 02/05/23 0700 02/06/23 0417  WBC 1.3* 1.6* 1.2* 1.3*  HGB 9.1* 9.4* 8.8* 9.3*  HCT 26.9* 27.5* 23.9* 26.6*  PLT 35* 38* 29* 32*     COAGS: Recent Labs    02/02/23 1248 02/02/23 2255 02/03/23 0733 02/04/23 0153  INR 1.0  --   --   --   APTT  --  52* 86* 87*    BMP: Recent Labs    02/04/23 0153 02/05/23 0700 02/06/23 0417 02/07/23 0456  NA 135 135 135 136  K 3.6 3.6 4.5 3.8  CL 101 102 101 102  CO2 25 24 25 22   GLUCOSE 103* 97 96 103*  BUN 9 6* 8 8  CALCIUM 8.5* 8.5* 9.1 8.7*  CREATININE 0.73 0.72 0.77 0.74  GFRNONAA >60 >60 >60 >60    LIVER FUNCTION TESTS: Recent Labs    02/04/23 0153 02/05/23 0700 02/06/23 0417 02/07/23 0456  BILITOT 0.8 0.7 0.8 0.8  AST 18 20 38 29  ALT 17 18 25 30   ALKPHOS 61 61 67 66  PROT 6.3* 5.6* 6.0* 5.6*  ALBUMIN 3.6 3.2* 3.3* 3.2*    TUMOR MARKERS: No results for input(s): "AFPTM", "CEA", "CA199", "CHROMGRNA" in the last 8760 hours.  Assessment and Plan:  Acute thrombosis of the left subclavian vein: Budd C. Weakland, 77 year old male, presents today to the [place] [facility] department for an image-guided left upper extremity venogram with possible intervention, possible left subclavian stent recanalization. This procedure will be done under general anesthesia. Dr. Serafina Royals met with the patient and his wife this morning in the pre-procedural area. Procedure details and possible outcomes were discussed. The patient and his wife are still in agreement to proceed.   Risks and benefits of this procedure were discussed with the patient including, but not limited to bleeding, infection, vascular injury or contrast induced renal failure.  This interventional procedure involves the use of X-rays and because of the nature of the planned procedure, it is possible that we will have prolonged use of X-ray fluoroscopy.  Potential radiation risks to you include (but are not limited to) the following: - A slightly elevated risk for cancer  several years later in life. This risk is typically less than 0.5% percent. This risk is low in comparison to the  normal incidence of human cancer, which is 33% for women and 50% for men according to the [place]. - Radiation induced injury can include skin redness, resembling a rash, tissue breakdown / ulcers and hair loss (which can be temporary or permanent).   The likelihood of either of these occurring depends on the difficulty of  the procedure and whether you are sensitive to radiation due to previous procedures, disease, or genetic conditions.   IF your procedure requires a prolonged use of radiation, you will be notified and given written instructions for further action.  It is your responsibility to monitor the irradiated area for the 2 weeks following the procedure and to notify your physician if you are concerned that you have suffered a radiation induced injury.    All of the patient's questions were answered, patient is agreeable to proceed. He has been NPO. Heparin infusion in place. Patient is a full code.   Consent signed and in chart.  Thank you for this interesting consult.  I greatly enjoyed meeting [facility] and look forward to participating in their care.  A copy of this report was sent to the requesting provider on this date.  Electronically Signed: Soyla Dryer, AGACNP-BC [phone] 02/07/2023, 7:36 AM   I spent a total of 20 Minutes    in face to face in clinical consultation, greater than 50% of which was counseling/coordinating care for left upper extremity venogram with possible intervention.

## 2023-02-07 NOTE — Progress Notes (Signed)
Patient arrived at the unit from [facility] checked,no complaints of pain , rt femoral and rt radial site level 0,old drainage marked at left radial,will continue to monitor

## 2023-02-07 NOTE — Progress Notes (Signed)
  Transition of Care [facility]) Screening Note   Patient Details  Name: [name] Date of Birth: 1946/09/11   Transition of Care [facility]) CM/SW Contact:    Pollie Friar, RN Phone Number: 02/07/2023, 3:47 PM   Pt is from home with spouse. He is s/p: RADIOLOGY WITH ANESTHESIA SVC RECANALIZATION. Transition of Care Department [facility]) has reviewed patient. We will continue to monitor patient advancement through interdisciplinary progression rounds. If new patient transition needs arise, please place a TOC consult.

## 2023-02-07 NOTE — Procedures (Addendum)
Interventional Radiology Procedure Note  Procedure:  1) Left upper extremity and central venogram 2) Left innominate vein angioplasty 3) Drug coated balloon angioplasty of left innominate vein  Findings: Please refer to procedural dictation for full description.  Chronically occluded stent, unable to recanalize from antegrade and retrograde approaches.  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  Right basilic, left brachial, and right common femoral venous accesses.  Complications: None immediate  Estimated Blood Loss: < 5 mL  Recommendations: 2 hour bedrest with head of bed restricted to 30 degrees. Recommend continuation of anticoagulation indefinitely. Follow up in IR clinic in 1 month.   Ruthann Cancer, MD Pager: [phone]

## 2023-02-07 NOTE — Progress Notes (Signed)
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   Medications:  - on Eliquis 2.5 mg bid (last dose taken on 02/02/23 at [place]). Pt's wife reported that he is compliant with this medication and has not missed any doses at home.  Assessment: Patient is a 77 y.o M with hx NSCLC and subclavian vein thrombosis in 2007 who presented to the ED on 02/02/23 with c/o left arm swelling.  Pt's wife reported that he was started on warfarin after the LUE DVT in 2007 and switched over to Eliquis by his PCP last year.  Left UE doppler on 02/02/23 showed "thrombus within the left subclavian vein."  He is currently on heparin drip for VTE treatment.  Patient went to IR 2/19 for angioplasty.  Heparin was continued throughout duration of procedure.  Heparin level is trending down, now subtherapeutic on 1600 units/hr.  PLTC low but stable 20-30s.  No bleeding noted.   Goal of Therapy:  Heparin level 0.3-0.7 units/ml aPTT 66-102 seconds Monitor platelets by anticoagulation protocol: Yes   Plan:  - increase heparin drip to 1700 units/hr  - check daily heparin level and CBC - monitor for s/sx bleeding, watch plts closely    Horton Chin, Pharm.D., BCPS Clinical  Pharmacist  **Pharmacist phone directory can be found on [email or web address] listed under [place].  02/07/2023 1:58 PM

## 2023-02-07 NOTE — Progress Notes (Signed)
CHART NOTE I was updated with the patient and his diagnosis of acute myeloid leukemia over the weekend.  He was seen by my partner Dr. Marin Olp over the weekend.  He is undergoing stent to the left subclavian vein today.  I will arrange for the patient to be seen at [facility] center within few days after his discharge.  We do not treat acute myeloid leukemia locally. I will see the patient later today after the clinic to discuss with him his diagnosis and the next step in his treatment.

## 2023-02-07 NOTE — Anesthesia Procedure Notes (Signed)
Central Venous Catheter Insertion Performed by: Effie Berkshire, MD, anesthesiologist Start/End2/19/2024 8:00 AM, 02/07/2023 8:10 AM Patient location: Pre-op. Preanesthetic checklist: patient identified, IV checked, site marked, risks and benefits discussed, surgical consent, monitors and equipment checked, pre-op evaluation, timeout performed and anesthesia consent Position: Trendelenburg Lidocaine 1% used for infiltration and patient sedated Hand hygiene performed , maximum sterile barriers used  and Seldinger technique used Catheter size: 8 Fr Total catheter length 16. Central line was placed.Double lumen Procedure performed using ultrasound guided technique. Ultrasound Notes:anatomy identified, needle tip was noted to be adjacent to the nerve/plexus identified, no ultrasound evidence of intravascular and/or intraneural injection and image(s) printed for medical record Attempts: 1 Following insertion, dressing applied, line sutured and Biopatch. Post procedure assessment: blood return through all ports  Patient tolerated the procedure well with no immediate complications.

## 2023-02-07 NOTE — Anesthesia Postprocedure Evaluation (Signed)
Anesthesia Post Note  Patient: [name]  Procedure(s) Performed: RADIOLOGY WITH ANESTHESIA SVC RECANALIZATION     Patient location during evaluation: PACU Anesthesia Type: General Level of consciousness: awake and alert and oriented Pain management: pain level controlled Vital Signs Assessment: post-procedure vital signs reviewed and stable Respiratory status: spontaneous breathing, nonlabored ventilation, respiratory function stable and patient connected to nasal cannula oxygen Cardiovascular status: blood pressure returned to baseline and stable Postop Assessment: no apparent nausea or vomiting Anesthetic complications: no   No notable events documented.  Last Vitals:  Vitals:   02/07/23 0354 02/07/23 0747  BP: (!) 121/50 (!) 167/89  Pulse: 84 86  Resp: 20 19  Temp: 36.8 C 36.9 C  SpO2: 92% 95%    Last Pain:  Vitals:   02/07/23 0747  TempSrc: Oral  PainSc: 0-No pain                 Nanna Ertle A.

## 2023-02-07 NOTE — Anesthesia Procedure Notes (Signed)
Arterial Line Insertion Start/End2/19/2024 7:55 AM, 02/07/2023 8:00 AM Performed by: Wilburn Cornelia, CRNA, CRNA  Patient location: Pre-op. Preanesthetic checklist: patient identified, IV checked, site marked, risks and benefits discussed, surgical consent, monitors and equipment checked, pre-op evaluation, timeout performed and anesthesia consent Lidocaine 1% used for infiltration and patient sedated Right, radial was placed Catheter size: 20 G Hand hygiene performed  and maximum sterile barriers used  Allen's test indicative of satisfactory collateral circulation Attempts: 3 Procedure performed without using ultrasound guided technique. Following insertion, Biopatch and dressing applied. Post procedure assessment: normal  Patient tolerated the procedure well with no immediate complications.

## 2023-02-07 NOTE — Discharge Summary (Signed)
Physician Discharge Summary  NEIL BRICKELL ZOX:[id number] DOB: 1946/09/17 DOA: 02/02/2023  PCP: Jodene Nam, MD  Admit date: 02/02/2023 Discharge date: 02/07/2023  Admitted From: Home  Disposition:  [place] , [facility]   Recommendations for Outpatient Follow-up:  As per next destination   Discharge Condition:Fair   CODE STATUS:Full Code  Diet recommendation: Regular diet   Discharge  Summary: 77 year old gentleman with history of lung cancer status post radiation therapy, history of prior subclavian thrombosis status post stenting in 2007, COPD, hyperlipidemia, hypertension, ongoing alcohol use presented to the [facility] long hospital with left arm swelling and left eye swelling for the last 3 to 4 days with shortness of breath.  Symptoms ongoing for more than a month.  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  Recently diagnosed pancytopenia. On admission seen by oncology at [facility]  Transferred to [facility] for scheduled subclavian stenting.  Underwent bone marrow biopsy 2/16. Results consistent with acute myeloid leukemia. He will be transferred to [facility] for further treatment plan.      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 Followed by oncology. Pursuing transfer to [facility] to initiate inpatient treatment. .   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  Platelets are 28K.  No evidence of bleeding.  Will continue. 2/15, underwent venogram 2/19, scheduled for venogram with angioplasty to open flow limitation. 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 Continue heparin with very close monitoring.   Alcohol use disorder: no evidence of withdrawal.  On multivitamins.  Essential hypertension: On Coreg 3.125.  Stable.   History of lung cancer: On surveillance.  Reportedly inactive since 2017.   OSA: Noncompliant.  Currently not using.   Medically stable for transfer. Will continue heparin infusion on discharge. Also continue mid line and IV line. DC arterial line and central line before transfer.  Discharge Diagnoses:  Principal Problem:   Subclavian vein thrombosis (HCC) Active Problems:   Hyperlipidemia   COPD GOLD II    OSA (obstructive sleep apnea)   Alcohol abuse   Malignant neoplasm of lower lobe, unspecified bronchus or lung (HCC)   Pancytopenia (HCC)   Acute myeloid leukemia not having achieved remission [facility])    Discharge Instructions  Discharge Instructions     Diet general   Complete by: As directed    Discharge wound care:   Complete by: As directed    Protect dressings and Iv lines   Increase activity slowly   Complete by: As directed       Allergies as of 02/07/2023   No Known Allergies      Medication List     STOP taking these medications    Eliquis 2.5 MG Tabs tablet Generic drug: apixaban   losartan 50 MG tablet Commonly known as: COZAAR       TAKE these medications    albuterol 108 (90 Base) MCG/ACT inhaler Commonly known as: VENTOLIN HFA Inhale 1-2 puffs into the lungs every 4 (four) hours as needed for wheezing or shortness of breath.   CALCIUM/D3 ADULT GUMMIES PO Take 1 tablet by mouth 2 (two) times daily. Gummy has 500 mg of calcium, D3 1000 units, zinc 7 mg   carvedilol 3.125 MG tablet Commonly known as:  COREG Take 3.125 mg by mouth 2 (two) times daily with a meal.   heparin 25000 UT/250ML infusion Inject 1,700 Units/hr into the vein continuous.   latanoprost 0.005 % ophthalmic solution Commonly known as: XALATAN Place 1 drop into both eyes at bedtime.   loperamide 2 MG tablet Commonly known as: IMODIUM A-D Take 2 mg by mouth as needed for diarrhea or loose  stools.   Lumify 0.025 % Soln Generic drug: Brimonidine Tartrate Place 1 drop into both eyes daily as needed (redness).   multivitamin with minerals Tabs tablet Take 1 tablet by mouth 2 (two) times daily.   Probiotic 1-250 BILLION-MG Caps Take 1 capsule by mouth daily.   Refresh Liquigel 1 % Gel Generic drug: Carboxymethylcellulose Sodium Place 1 drop into both eyes 2 (two) times daily as needed (dryness).   rosuvastatin 10 MG tablet Commonly known as: CRESTOR Take 10 mg by mouth daily.   Spiriva HandiHaler 18 MCG inhalation capsule Generic drug: tiotropium Place 18 mcg into inhaler and inhale daily.               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 The patient presents with new onset left upper extremity swelling with imaging evidence of stent occlusion. Recent diagnosis of AML. EXAM: 1. Ultrasound-guided vascular access of the left brachial, right basilic, and right common femoral veins. 2. Left upper extremity and central venogram. 3. Plain balloon angioplasty of left innominate vein. 4. Drug coated balloon angioplasty of left innominate vein. COMPARISON:  02/03/2023 MEDICATIONS: None. ANESTHESIA/SEDATION: The procedure was performed under general anesthesia. FLUOROSCOPY TIME:  One hundred twenty-five mGy CONTRAST:  50 mL Omnipaque 350, intravenous COMPLICATIONS: None immediate. TECHNIQUE: Informed written consent was obtained from the patient after a thorough discussion of the  procedural risks, benefits and alternatives. All questions were addressed. Maximal Sterile Barrier Technique was utilized including caps, mask, sterile gowns, sterile gloves, sterile drape, hand hygiene and skin antiseptic. A timeout was performed prior to the initiation of the procedure. The bilateral upper extremities and right coronary prepped and draped in standard fashion. Preprocedure ultrasound evaluation of the left upper extremity demonstrated patent brachial vein. Under direct ultrasound visualization, the left brachial vein was accessed with a 21 gauge micropuncture needle. A permanent ultrasound image was captured and stored in the record. A micropuncture sheath was introduced followed by placement of a Wholey wire which was directed toward the axillary vein. Next, a 16 [place], angled destination sheath was inserted and positioned with the tip in the left axillary vein. Gentle hand injection of contrast demonstrated intraluminal position and persistent stent occlusion. Preprocedure ultrasound evaluation of the right upper extremity demonstrated patent basilic vein. Under direct ultrasound visualization, the right basilic vein was accessed with a 21 gauge micropuncture needle. A permanent ultrasound image was captured and stored in the record. A micropuncture sheath was introduced followed by placement of a Wholey wire which was directed  toward the axillary vein. Next, a 60 [place], angled destination sheath was inserted and positioned with the tip in the right innominate vein. A rim catheter was then inserted and used to select the left innominate vein. Simultaneous venogram was performed via injection through both access sites. Venogram was consistent for complete, chronic appearing occlusion of the indwelling left subclavian vein stents with multiple mature collateral veins draining via the intercostals and left cervical region. Next, attempts were made from the left upper extremity access to recanalized  the occluded stent within Navicross catheter and stiff Glidewire which were unsuccessful. Therefore, attempts were made recanalization from a retrograde approach. From the right upper extremity, wire purchase into the left innominate vein for recanalization purposes was inadequate despite multiple catheter and wire combinations. Therefore, the right common femoral vein was accessed under direct ultrasound visualization with a 21 gauge micropuncture needle. A permanent image was captured and stored in the record. A Wholey wire was inserted level of the superior vena cava and a 6 [place], 55 cm vascular sheath was placed and positioned in the superior vena cava the left innominate vein was then selected with the Wholey wire and a Navicross catheter. The catheter and wire engage the central aspect of the indwelling stent but was unable to be traversed further. Additional recanalization attempts with a stiff glidewire were also unsuccessful. Repeat left innominate venogram demonstrated drainage from what is likely a left external jugular vein via the uncovered, central most indwelling stent in addition to collateral drainage inferiorly. Incidentally noted, there was high-grade focal stenosis of the midportion left innominate vein. Therefore, balloon angioplasty was performed with a 7 mm x 4 mm Athletis balloon. Post angioplasty venogram demonstrated moderately improved patency and antegrade flow. Next, drug coated balloon angioplasty was performed with a 7 mm x 8 cm InPACT DCB with prolonged inflation for 3 minutes. Completion left innominate venogram demonstrated improved patency inflow without evidence of extravasation or other complicating features. The sheaths were removed and hemostasis was achieved with brief manual compression at each venous access site. Sterile bandages were applied. The patient tolerated the procedure well was transferred to the recovery area under the direct care of the Anesthesia team.  IMPRESSION: 1. 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 2. 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 3. Unsuccessful recanalization of the indwelling, chronically occluded left subclavian vein stents. 4. Technically successful plain and drug coated balloon angioplasty of the left innominate vein stenosis with improved patency and flow. Ruthann Cancer, MD [facility] Radiology Electronically Signed   By: Ruthann Cancer M.D.   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 The patient presents with new onset left upper extremity swelling with imaging evidence of stent occlusion. Recent diagnosis of AML. EXAM: 1. Ultrasound-guided vascular access of the left brachial, right basilic, and right common femoral veins. 2. Left upper extremity and central venogram. 3. Plain balloon angioplasty of left innominate vein. 4. Drug coated balloon angioplasty of left innominate vein. COMPARISON:  02/03/2023 MEDICATIONS: None. ANESTHESIA/SEDATION: The procedure was performed under general anesthesia. FLUOROSCOPY TIME:  One hundred twenty-five mGy CONTRAST:  50 mL Omnipaque 350, intravenous COMPLICATIONS: None immediate. TECHNIQUE: Informed written consent was obtained from the patient after a thorough discussion of  the procedural risks, benefits and alternatives. All questions were addressed. Maximal Sterile Barrier Technique was utilized including caps, mask, sterile gowns, sterile gloves, sterile drape, hand hygiene and skin antiseptic. A timeout was performed prior to the initiation of the procedure. The bilateral upper extremities and right coronary prepped and draped in standard fashion. Preprocedure  ultrasound evaluation of the left upper extremity demonstrated patent brachial vein. Under direct ultrasound visualization, the left brachial vein was accessed with a 21 gauge micropuncture needle. A permanent ultrasound image was captured and stored in the record. A micropuncture sheath was introduced followed by placement of a Wholey wire which was directed toward the axillary vein. Next, a 42 [place], angled destination sheath was inserted and positioned with the tip in the left axillary vein. Gentle hand injection of contrast demonstrated intraluminal position and persistent stent occlusion. Preprocedure ultrasound evaluation of the right upper extremity demonstrated patent basilic vein. Under direct ultrasound visualization, the right basilic vein was accessed with a 21 gauge micropuncture needle. A permanent ultrasound image was captured and stored in the record. A micropuncture sheath was introduced followed by placement of a Wholey wire which was directed toward the axillary vein. Next, a 89 [place], angled destination sheath was inserted and positioned with the tip in the right innominate vein. A rim catheter was then inserted and used to select the left innominate vein. Simultaneous venogram was performed via injection through both access sites. Venogram was consistent for complete, chronic appearing occlusion of the indwelling left subclavian vein stents with multiple mature collateral veins draining via the intercostals and left cervical region. Next, attempts were made from the left upper extremity access to recanalized the occluded stent within Navicross catheter and stiff Glidewire which were unsuccessful. Therefore, attempts were made recanalization from a retrograde approach. From the right upper extremity, wire purchase into the left innominate vein for recanalization purposes was inadequate despite multiple catheter and wire combinations. Therefore, the right common femoral vein was accessed under  direct ultrasound visualization with a 21 gauge micropuncture needle. A permanent image was captured and stored in the record. A Wholey wire was inserted level of the superior vena cava and a 6 [place], 55 cm vascular sheath was placed and positioned in the superior vena cava the left innominate vein was then selected with the Wholey wire and a Navicross catheter. The catheter and wire engage the central aspect of the indwelling stent but was unable to be traversed further. Additional recanalization attempts with a stiff glidewire were also unsuccessful. Repeat left innominate venogram demonstrated drainage from what is likely a left external jugular vein via the uncovered, central most indwelling stent in addition to collateral drainage inferiorly. Incidentally noted, there was high-grade focal stenosis of the midportion left innominate vein. Therefore, balloon angioplasty was performed with a 7 mm x 4 mm Athletis balloon. Post angioplasty venogram demonstrated moderately improved patency and antegrade flow. Next, drug coated balloon angioplasty was performed with a 7 mm x 8 cm InPACT DCB with prolonged inflation for 3 minutes. Completion left innominate venogram demonstrated improved patency inflow without evidence of extravasation or other complicating features. The sheaths were removed and hemostasis was achieved with brief manual compression at each venous access site. Sterile bandages were applied. The patient tolerated the procedure well was transferred to the recovery area under the direct care of the Anesthesia team. IMPRESSION: 1. 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 2. 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 3. Unsuccessful recanalization of  the indwelling, chronically occluded left subclavian vein stents. 4. Technically successful plain and drug coated balloon angioplasty of the left innominate vein stenosis with improved patency and flow. Ruthann Cancer,  MD [facility] Radiology Electronically Signed   By: Ruthann Cancer M.D.   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 The patient presents with new onset left upper extremity swelling with imaging evidence of stent occlusion. Recent diagnosis of AML. EXAM: 1. Ultrasound-guided vascular access of the left brachial, right basilic, and right common femoral veins. 2. Left upper extremity and central venogram. 3. Plain balloon angioplasty of left innominate vein. 4. Drug coated balloon angioplasty of left innominate vein. COMPARISON:  02/03/2023 MEDICATIONS: None. ANESTHESIA/SEDATION: The procedure was performed under general anesthesia. FLUOROSCOPY TIME:  One hundred twenty-five mGy CONTRAST:  50 mL Omnipaque 350, intravenous COMPLICATIONS: None immediate. TECHNIQUE: Informed written consent was obtained from the patient after a thorough discussion of the procedural risks, benefits and alternatives. All questions were addressed. Maximal Sterile Barrier Technique was utilized including caps, mask, sterile gowns, sterile gloves, sterile drape, hand hygiene and skin antiseptic. A timeout was performed prior to the initiation of the procedure. The bilateral upper extremities and right coronary prepped and draped in standard fashion. Preprocedure ultrasound evaluation of the left upper extremity demonstrated patent brachial vein. Under direct ultrasound visualization, the left brachial vein was accessed with a 21 gauge micropuncture needle. A permanent ultrasound image was captured and stored in the record. A micropuncture sheath was introduced followed by placement of a Wholey wire which was directed toward the axillary vein. Next, a 33 [place],  angled destination sheath was inserted and positioned with the tip in the left axillary vein. Gentle hand injection of contrast demonstrated intraluminal position and persistent stent occlusion. Preprocedure ultrasound evaluation of the right upper extremity demonstrated patent basilic vein. Under direct ultrasound visualization, the right basilic vein was accessed with a 21 gauge micropuncture needle. A permanent ultrasound image was captured and stored in the record. A micropuncture sheath was introduced followed by placement of a Wholey wire which was directed toward the axillary vein. Next, a 66 [place], angled destination sheath was inserted and positioned with the tip in the right innominate vein. A rim catheter was then inserted and used to select the left innominate vein. Simultaneous venogram was performed via injection through both access sites. Venogram was consistent for complete, chronic appearing occlusion of the indwelling left subclavian vein stents with multiple mature collateral veins draining via the intercostals and left cervical region. Next, attempts were made from the left upper extremity access to recanalized the occluded stent within Navicross catheter and stiff Glidewire which were unsuccessful. Therefore, attempts were made recanalization from a retrograde approach. From the right upper extremity, wire purchase into the left innominate vein for recanalization purposes was inadequate despite multiple catheter and wire combinations. Therefore, the right common femoral vein was accessed under direct ultrasound visualization with a 21 gauge micropuncture needle. A permanent image was captured and stored in the record. A Wholey wire was inserted level of the superior vena cava and a 6 [place], 55 cm vascular sheath was placed and positioned in the superior vena cava the left innominate vein was then selected with the Wholey wire and a Navicross catheter. The catheter  and wire engage the central  aspect of the indwelling stent but was unable to be traversed further. Additional recanalization attempts with a stiff glidewire were also unsuccessful. Repeat left innominate venogram demonstrated drainage from what is likely a left external jugular vein via the uncovered, central most indwelling stent in addition to collateral drainage inferiorly. Incidentally noted, there was high-grade focal stenosis of the midportion left innominate vein. Therefore, balloon angioplasty was performed with a 7 mm x 4 mm Athletis balloon. Post angioplasty venogram demonstrated moderately improved patency and antegrade flow. Next, drug coated balloon angioplasty was performed with a 7 mm x 8 cm InPACT DCB with prolonged inflation for 3 minutes. Completion left innominate venogram demonstrated improved patency inflow without evidence of extravasation or other complicating features. The sheaths were removed and hemostasis was achieved with brief manual compression at each venous access site. Sterile bandages were applied. The patient tolerated the procedure well was transferred to the recovery area under the direct care of the Anesthesia team. IMPRESSION: 1. 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 2. 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 3. Unsuccessful recanalization of the indwelling, chronically occluded left subclavian vein stents. 4. Technically successful plain and drug coated balloon angioplasty of the left innominate vein stenosis with improved patency and flow. Ruthann Cancer, MD [facility] Radiology Electronically Signed   By: Ruthann Cancer M.D.   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 The patient  presents with new onset left upper extremity swelling with imaging evidence of stent occlusion. Recent diagnosis of AML. EXAM: 1. Ultrasound-guided vascular access of the left brachial, right basilic, and right common femoral veins. 2. Left upper extremity and central venogram. 3. Plain balloon angioplasty of left innominate vein. 4. Drug coated balloon angioplasty of left innominate vein. COMPARISON:  02/03/2023 MEDICATIONS: None. ANESTHESIA/SEDATION: The procedure was performed under general anesthesia. FLUOROSCOPY TIME:  One hundred twenty-five mGy CONTRAST:  50 mL Omnipaque 350, intravenous COMPLICATIONS: None immediate. TECHNIQUE: Informed written consent was obtained from the patient after a thorough discussion of the procedural risks, benefits and alternatives. All questions were addressed. Maximal Sterile Barrier Technique was utilized including caps, mask, sterile gowns, sterile gloves, sterile drape, hand hygiene and skin antiseptic. A timeout was performed prior to the initiation of the procedure. The bilateral upper extremities and right coronary prepped and draped in standard fashion. Preprocedure ultrasound evaluation of the left upper extremity demonstrated patent brachial vein. Under direct ultrasound visualization, the left brachial vein was accessed with a 21 gauge micropuncture needle. A permanent ultrasound image was captured and stored in the record. A micropuncture sheath was introduced followed by placement of a Wholey wire which was directed toward the axillary vein. Next, a 1 [place], angled destination sheath was inserted and positioned with the tip in the left axillary vein. Gentle hand injection of contrast demonstrated intraluminal position and persistent stent occlusion. Preprocedure ultrasound evaluation of the right upper extremity demonstrated patent basilic vein. Under direct ultrasound visualization, the right basilic vein was accessed with a 21 gauge micropuncture needle. A  permanent ultrasound image was captured and stored in the record. A micropuncture sheath was introduced followed by placement of a Wholey wire which was directed toward the axillary vein. Next, a 6 [place], angled destination  sheath was inserted and positioned with the tip in the right innominate vein. A rim catheter was then inserted and used to select the left innominate vein. Simultaneous venogram was performed via injection through both access sites. Venogram was consistent for complete, chronic appearing occlusion of the indwelling left subclavian vein stents with multiple mature collateral veins draining via the intercostals and left cervical region. Next, attempts were made from the left upper extremity access to recanalized the occluded stent within Navicross catheter and stiff Glidewire which were unsuccessful. Therefore, attempts were made recanalization from a retrograde approach. From the right upper extremity, wire purchase into the left innominate vein for recanalization purposes was inadequate despite multiple catheter and wire combinations. Therefore, the right common femoral vein was accessed under direct ultrasound visualization with a 21 gauge micropuncture needle. A permanent image was captured and stored in the record. A Wholey wire was inserted level of the superior vena cava and a 6 [place], 55 cm vascular sheath was placed and positioned in the superior vena cava the left innominate vein was then selected with the Wholey wire and a Navicross catheter. The catheter and wire engage the central aspect of the indwelling stent but was unable to be traversed further. Additional recanalization attempts with a stiff glidewire were also unsuccessful. Repeat left innominate venogram demonstrated drainage from what is likely a left external jugular vein via the uncovered, central most indwelling stent in addition to collateral drainage inferiorly. Incidentally noted, there was high-grade focal stenosis of  the midportion left innominate vein. Therefore, balloon angioplasty was performed with a 7 mm x 4 mm Athletis balloon. Post angioplasty venogram demonstrated moderately improved patency and antegrade flow. Next, drug coated balloon angioplasty was performed with a 7 mm x 8 cm InPACT DCB with prolonged inflation for 3 minutes. Completion left innominate venogram demonstrated improved patency inflow without evidence of extravasation or other complicating features. The sheaths were removed and hemostasis was achieved with brief manual compression at each venous access site. Sterile bandages were applied. The patient tolerated the procedure well was transferred to the recovery area under the direct care of the Anesthesia team. IMPRESSION: 1. 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 2. 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 3. Unsuccessful recanalization of the indwelling, chronically occluded left subclavian vein stents. 4. Technically successful plain and drug coated balloon angioplasty of the left innominate vein stenosis with improved patency and flow. Ruthann Cancer, MD [facility] Radiology Electronically Signed   By: Ruthann Cancer M.D.   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 The patient presents with new onset left upper extremity swelling with imaging evidence of stent occlusion. Recent diagnosis of AML. EXAM: 1. Ultrasound-guided vascular access of the left brachial, right basilic, and right common femoral veins. 2. Left upper extremity and central venogram. 3. Plain balloon angioplasty of left innominate vein. 4. Drug coated balloon angioplasty of left innominate vein.  COMPARISON:  02/03/2023 MEDICATIONS: None. ANESTHESIA/SEDATION: The procedure was performed under general anesthesia. FLUOROSCOPY TIME:  One hundred twenty-five mGy CONTRAST:  50 mL Omnipaque 350, intravenous COMPLICATIONS: None immediate. TECHNIQUE: Informed written consent was obtained from the patient after a thorough discussion of the procedural risks, benefits and alternatives. All questions were addressed.  Maximal Sterile Barrier Technique was utilized including caps, mask, sterile gowns, sterile gloves, sterile drape, hand hygiene and skin antiseptic. A timeout was performed prior to the initiation of the procedure. The bilateral upper extremities and right coronary prepped and draped in standard fashion. Preprocedure ultrasound evaluation of the left upper extremity demonstrated patent brachial vein. Under direct ultrasound visualization, the left brachial vein was accessed with a 21 gauge micropuncture needle. A permanent ultrasound image was captured and stored in the record. A micropuncture sheath was introduced followed by placement of a Wholey wire which was directed toward the axillary vein. Next, a 15 [place], angled destination sheath was inserted and positioned with the tip in the left axillary vein. Gentle hand injection of contrast demonstrated intraluminal position and persistent stent occlusion. Preprocedure ultrasound evaluation of the right upper extremity demonstrated patent basilic vein. Under direct ultrasound visualization, the right basilic vein was accessed with a 21 gauge micropuncture needle. A permanent ultrasound image was captured and stored in the record. A micropuncture sheath was introduced followed by placement of a Wholey wire which was directed toward the axillary vein. Next, a 47 [place], angled destination sheath was inserted and positioned with the tip in the right innominate vein. A rim catheter was then inserted and used to select the left innominate vein. Simultaneous  venogram was performed via injection through both access sites. Venogram was consistent for complete, chronic appearing occlusion of the indwelling left subclavian vein stents with multiple mature collateral veins draining via the intercostals and left cervical region. Next, attempts were made from the left upper extremity access to recanalized the occluded stent within Navicross catheter and stiff Glidewire which were unsuccessful. Therefore, attempts were made recanalization from a retrograde approach. From the right upper extremity, wire purchase into the left innominate vein for recanalization purposes was inadequate despite multiple catheter and wire combinations. Therefore, the right common femoral vein was accessed under direct ultrasound visualization with a 21 gauge micropuncture needle. A permanent image was captured and stored in the record. A Wholey wire was inserted level of the superior vena cava and a 6 [place], 55 cm vascular sheath was placed and positioned in the superior vena cava the left innominate vein was then selected with the Wholey wire and a Navicross catheter. The catheter and wire engage the central aspect of the indwelling stent but was unable to be traversed further. Additional recanalization attempts with a stiff glidewire were also unsuccessful. Repeat left innominate venogram demonstrated drainage from what is likely a left external jugular vein via the uncovered, central most indwelling stent in addition to collateral drainage inferiorly. Incidentally noted, there was high-grade focal stenosis of the midportion left innominate vein. Therefore, balloon angioplasty was performed with a 7 mm x 4 mm Athletis balloon. Post angioplasty venogram demonstrated moderately improved patency and antegrade flow. Next, drug coated balloon angioplasty was performed with a 7 mm x 8 cm InPACT DCB with prolonged inflation for 3 minutes. Completion left innominate venogram demonstrated improved patency  inflow without evidence of extravasation or other complicating features. The sheaths were removed and hemostasis was achieved with brief manual compression at each venous access site. Sterile bandages were applied. The patient tolerated the procedure well was transferred to the recovery area under the direct care of the Anesthesia team. IMPRESSION: 1. 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 2. 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 3. Unsuccessful recanalization of the indwelling, chronically occluded left subclavian vein stents. 4. Technically  successful plain and drug coated balloon angioplasty of the left innominate vein stenosis with improved patency and flow. Ruthann Cancer, MD [facility] Radiology Electronically Signed   By: Ruthann Cancer M.D.   On: 02/07/2023 11:18   CT BONE MARROW BIOPSY & ASPIRATION  Result Date: 02/04/2023 INDICATION: 77 year old male with history of pain status presenting for bone marrow biopsy. EXAM: CT-GUIDED BONE MARROW BIOPSY AND ASPIRATION MEDICATIONS: None ANESTHESIA/SEDATION: Fentanyl 100 mcg IV; Versed 2 mg IV Sedation Time: 10 minutes; The patient was continuously monitored during the procedure by the interventional radiology nurse under my direct supervision. COMPLICATIONS: None immediate. PROCEDURE: Informed consent was obtained from the patient following an explanation of the procedure, risks, benefits and alternatives. The patient understands, agrees and consents for the procedure. All questions were addressed. A time out was performed prior to the initiation of the procedure. The patient was positioned prone and non-contrast localization CT was performed of the pelvis to demonstrate the iliac marrow spaces. The operative site was prepped and draped in the usual sterile fashion. Under sterile conditions and local anesthesia, a 22 gauge spinal needle was utilized for  procedural planning. Next, an 11 gauge coaxial bone biopsy needle was advanced into the right iliac marrow space. Needle position was confirmed with CT imaging. Initially, a bone marrow aspiration was performed. Next, a bone marrow biopsy was obtained with the 11 gauge outer bone marrow device. Samples were prepared with the cytotechnologist and deemed adequate. The needle was removed and superficial hemostasis was obtained with manual compression. A dressing was applied. The patient tolerated the procedure well without immediate post procedural complication. IMPRESSION: Successful CT guided right iliac bone marrow aspiration and core biopsy. Ruthann Cancer, MD [facility] Radiology Electronically Signed   By: Ruthann Cancer M.D.   On: 02/04/2023 10:04   IR Veno/Ext/Uni Left  Result Date: 02/03/2023 INDICATION: 78 year old male with suspected left subclavian stent occlusion. EXAM: 1. Ultrasound-guided vascular access of the left upper extremity 2. Left upper extremity venogram COMPARISON:  None Available. MEDICATIONS: None. ANESTHESIA/SEDATION: None. FLUOROSCOPY TIME:  Fifty-eight mGy COMPLICATIONS: None immediate. CONTRAST:  20 mL Omnipaque 300, intravenous TECHNIQUE: Informed written consent was obtained from the patient after a thorough discussion of the procedural risks, benefits and alternatives. All questions were addressed. Maximal Sterile Barrier Technique was utilized including caps, mask, sterile gowns, sterile gloves, sterile drape, hand hygiene and skin antiseptic. A timeout was performed prior to the initiation of the procedure. Left upper extremity was prepped and draped in standard fashion. Preprocedure ultrasound evaluation demonstrated patency of a prominent left brachial vein. Of note, there are multiple subcutaneous varicosities in the left upper extremity. Procedure was planned. Subdermal Local anesthesia was provided at the planned needle entry  site. A small skin nick was made. Under direct ultrasound visualization, the left brachial artery was accessed with a 20 gauge micropuncture needle. A permanent ultrasound image was captured and stored in the record. Micropuncture sheath was then introduced and left upper extremity venogram was performed. Venogram was significant for patent basilic and axillary veins. There is flush occlusion of the indwelling left subclavian stents. There are multiple chest wall collateral veins which appear mature. FINDINGS: Occluded indwelling left subclavian vein stents. IMPRESSION: Occluded indwelling left subclavian vein stents. Chronicity is indeterminate, though suspected to have chronic component. PLAN: IR will arrange for possible stent recanalization. Ruthann Cancer, MD [facility] Radiology Electronically Signed   By: Ruthann Cancer  M.D.   On: 02/03/2023 17:09   IR US Guide Vasc Access Left  Result Date: 02/03/2023 INDICATION: 77 year old male with suspected left subclavian stent occlusion. EXAM: 1. Ultrasound-guided vascular access of the left upper extremity 2. Left upper extremity venogram COMPARISON:  None Available. MEDICATIONS: None. ANESTHESIA/SEDATION: None. FLUOROSCOPY TIME:  Fifty-eight mGy COMPLICATIONS: None immediate. CONTRAST:  20 mL Omnipaque 300, intravenous TECHNIQUE: Informed written consent was obtained from the patient after a thorough discussion of the procedural risks, benefits and alternatives. All questions were addressed. Maximal Sterile Barrier Technique was utilized including caps, mask, sterile gowns, sterile gloves, sterile drape, hand hygiene and skin antiseptic. A timeout was performed prior to the initiation of the procedure. Left upper extremity was prepped and draped in standard fashion. Preprocedure ultrasound evaluation demonstrated patency of a prominent left brachial vein. Of note, there are multiple subcutaneous varicosities in the  left upper extremity. Procedure was planned. Subdermal Local anesthesia was provided at the planned needle entry site. A small skin nick was made. Under direct ultrasound visualization, the left brachial artery was accessed with a 20 gauge micropuncture needle. A permanent ultrasound image was captured and stored in the record. Micropuncture sheath was then introduced and left upper extremity venogram was performed. Venogram was significant for patent basilic and axillary veins. There is flush occlusion of the indwelling left subclavian stents. There are multiple chest wall collateral veins which appear mature. FINDINGS: Occluded indwelling left subclavian vein stents. IMPRESSION: Occluded indwelling left subclavian vein stents. Chronicity is indeterminate, though suspected to have chronic component. PLAN: IR will arrange for possible stent recanalization. Ruthann Cancer, MD [facility] Radiology Electronically Signed   By: Ruthann Cancer M.D.   On: 02/03/2023 17:09   US Abdomen Limited RUQ (LIVER/GB)  Result Date: 02/03/2023 CLINICAL DATA:  100250 Liver disease 100250 EXAM: ULTRASOUND ABDOMEN LIMITED RIGHT UPPER QUADRANT COMPARISON:  10/11/2018 FINDINGS: Gallbladder: No gallstones, pericholecystic fluid or wall thickening visualized. Common bile duct: Diameter: 0.4 cm. Liver: Liver is hyperechoic consistent with fatty infiltration. No focal hepatic lesions identified. No intrahepatic ductal dilatation. Hepatopetal portal vein. IMPRESSION: Hepatic fatty infiltration. Otherwise unremarkable examination of the right upper quadrant. Electronically Signed   By: Sammie Bench M.D.   On: 02/03/2023 06:35   CT Angio Chest Pulmonary Embolism (PE) W or WO Contrast  Result Date: 02/03/2023 CLINICAL DATA:  Pulmonary embolism suspected, high probability. Shortness of breath getting worse for months. Left arm swelling. Left subclavian stent. History of lung cancer and  partial left lung removal. COPD. EXAM: CT ANGIOGRAPHY CHEST WITH CONTRAST TECHNIQUE: Multidetector CT imaging of the chest was performed using the standard protocol during bolus administration of intravenous contrast. Multiplanar CT image reconstructions and MIPs were obtained to evaluate the vascular anatomy. RADIATION DOSE REDUCTION: This exam was performed according to the departmental dose-optimization program which includes automated exposure control, adjustment of the mA and/or kV according to patient size and/or use of iterative reconstruction technique. CONTRAST:  68mL OMNIPAQUE IOHEXOL 350 MG/ML SOLN COMPARISON:  09/20/2022. FINDINGS: Cardiovascular: The heart is normal in size and there is no pericardial effusion. Multi-vessel coronary artery calcifications are noted. There is atherosclerotic calcification of the aorta without evidence of aneurysm. The pulmonary trunk is normal in caliber. No pulmonary artery filling defect is identified. Examination is limited due to respiratory motion and mixing artifact. Left subclavian stent is in place, however patency can not be determined due to timing of contrast bolus. Mediastinum/Nodes: No mediastinal, hilar, or axillary lymphadenopathy. The thyroid  gland, trachea, and esophagus are within normal limits. Lungs/Pleura: Paraseptal and centrilobular emphysematous changes are present in the lungs. Postsurgical changes are noted at the left hilum with reduced lung volume on the left. A stable cystic lesion is noted in the perihilar region of the left lower lobe. No effusion or pneumothorax. A 3 mm nodule is present in the right middle lobe, unchanged. Upper Abdomen: No acute abnormality. Musculoskeletal: The bony structures are stable. No acute or suspicious osseous abnormality. Review of the MIP images confirms the above findings. IMPRESSION: 1. No evidence of pulmonary embolism or other acute process. 2. Stable postsurgical changes in the left upper lobe. 3.  Emphysema. 4. Aortic atherosclerosis and coronary artery calcifications. Electronically Signed   By: Brett Fairy M.D.   On: 02/03/2023 00:22   US Venous Img Upper Uni Left  Result Date: 02/02/2023 CLINICAL DATA:  Left upper extremity swelling for 3-4 days. EXAM: LEFT UPPER EXTREMITY VENOUS DOPPLER ULTRASOUND TECHNIQUE: Gray-scale sonography with graded compression, as well as color Doppler and duplex ultrasound were performed to evaluate the upper extremity deep venous system from the level of the subclavian vein and including the jugular, axillary, basilic, radial, ulnar and upper cephalic vein. Spectral Doppler was utilized to evaluate flow at rest and with distal augmentation maneuvers. COMPARISON:  None Available. FINDINGS: Contralateral Subclavian Vein: Respiratory phasicity is normal and symmetric with the symptomatic side. No evidence of thrombus. Normal compressibility. Internal Jugular Vein: No evidence of thrombus. Normal compressibility, respiratory phasicity and response to augmentation. Subclavian Vein: Thrombus identified within the left subclavian. Axillary Vein: No evidence of thrombus. Normal compressibility, respiratory phasicity and response to augmentation. Cephalic Vein: No evidence of thrombus. Normal compressibility, respiratory phasicity and response to augmentation. Basilic Vein: No evidence of thrombus. Normal compressibility, respiratory phasicity and response to augmentation. Brachial Veins: No evidence of thrombus. Normal compressibility, respiratory phasicity and response to augmentation. Radial Veins: No evidence of thrombus. Normal compressibility, respiratory phasicity and response to augmentation. Ulnar Veins: No evidence of thrombus. Normal compressibility, respiratory phasicity and response to augmentation. Venous Reflux:  None visualized. Other Findings:  None visualized. IMPRESSION: Exam positive for thrombus within the left subclavian vein. These results were called by  telephone at the time of interpretation on 02/02/2023 at 1:37 pm to provider [facility] , who verbally acknowledged these results. Electronically Signed   By: Kerby Moors M.D.   On: 02/02/2023 13:39   DG Chest 2 View  Result Date: 02/02/2023 CLINICAL DATA:  Shortness of breath. EXAM: CHEST - 2 VIEW COMPARISON:  January 03, 2018. FINDINGS: The heart size and mediastinal contours are within normal limits. Old left rib fractures are noted. Minimal right perihilar subsegmental atelectasis or scarring is noted. Left lung is unremarkable. IMPRESSION: Minimal right perihilar subsegmental atelectasis or scarring. Electronically Signed   By: Marijo Conception M.D.   On: 02/02/2023 12:05   (Echo, Carotid, EGD, Colonoscopy, ERCP)    Subjective: he is without any complaints today after the procedure    Discharge Exam: Vitals:   02/07/23 1412 02/07/23 1512  BP:    Pulse: 83 82  Resp: 20 13  Temp:    SpO2: 92% 91%   Vitals:   02/07/23 1230 02/07/23 1300 02/07/23 1412 02/07/23 1512  BP: (!) 159/67 (!) 161/70    Pulse: 73 82 83 82  Resp: 15 18 20 13   Temp: 98.4 F (36.9 C) 98.2 F (36.8 C)    TempSrc: Oral Oral    SpO2: 96% 94% 92%  91%  Weight:      Height:            The results of significant diagnostics from this hospitalization (including imaging, microbiology, ancillary and laboratory) are listed below for reference.     Microbiology: No results found for this or any previous visit (from the past 240 hour(s)).   Labs: BNP (last 3 results) Recent Labs    02/02/23 1135  BNP 21.2   Basic Metabolic Panel: Recent Labs  Lab 02/02/23 2255 02/03/23 0529 02/04/23 0153 02/05/23 0700 02/06/23 0417 02/07/23 0456  NA  --  136 135 135 135 136  K  --  3.6 3.6 3.6 4.5 3.8  CL  --  103 101 102 101 102  CO2  --  26 25 24 25 22   GLUCOSE  --  98 103* 97 96 103*  BUN  --  9 9 6* 8 8  CREATININE  --  0.60* 0.73 0.72 0.77 0.74  CALCIUM  --  8.7* 8.5* 8.5* 9.1 8.7*  MG 2.1 2.0  2.0 2.0 2.1  --   PHOS 3.3 3.9 3.4 3.4 3.9  --    Liver Function Tests: Recent Labs  Lab 02/03/23 0529 02/04/23 0153 02/05/23 0700 02/06/23 0417 02/07/23 0456  AST 16 18 20  38 29  ALT 16 17 18 25 30   ALKPHOS 57 61 61 67 66  BILITOT 0.9 0.8 0.7 0.8 0.8  PROT 6.0* 6.3* 5.6* 6.0* 5.6*  ALBUMIN 3.2* 3.6 3.2* 3.3* 3.2*   No results for input(s): "LIPASE", "AMYLASE" in the last 168 hours. No results for input(s): "AMMONIA" in the last 168 hours. CBC: Recent Labs  Lab 02/03/23 0529 02/04/23 0153 02/05/23 0700 02/06/23 0417 02/07/23 0456  WBC 1.3* 1.6* 1.2* 1.3* 1.2*  NEUTROABS 0.1* 0.2* 0.2* 0.2* 0.2*  HGB 9.1* 9.4* 8.8* 9.3* 8.6*  HCT 26.9* 27.5* 23.9* 26.6* 23.6*  MCV 115.9* 112.7* 110.1* 112.2* 112.4*  PLT 35* 38* 29* 32* 28*   Cardiac Enzymes: Recent Labs  Lab 02/02/23 2255  CKTOTAL 54   BNP: Invalid input(s): "POCBNP" CBG: No results for input(s): "GLUCAP" in the last 168 hours. D-Dimer No results for input(s): "DDIMER" in the last 72 hours. Hgb A1c No results for input(s): "HGBA1C" in the last 72 hours. Lipid Profile No results for input(s): "CHOL", "HDL", "LDLCALC", "TRIG", "CHOLHDL", "LDLDIRECT" in the last 72 hours. Thyroid function studies No results for input(s): "TSH", "T4TOTAL", "T3FREE", "THYROIDAB" in the last 72 hours.  Invalid input(s): "FREET3" Anemia work up No results for input(s): "VITAMINB12", "FOLATE", "FERRITIN", "TIBC", "IRON", "RETICCTPCT" in the last 72 hours. Urinalysis    Component Value Date/Time   COLORURINE YELLOW 01/03/2017 1311   APPEARANCEUR CLEAR 01/03/2017 1311   LABSPEC 1.005 01/03/2017 1311   PHURINE 6.0 01/03/2017 1311   GLUCOSEU NEGATIVE 01/03/2017 1311   HGBUR NEGATIVE 01/03/2017 1311   BILIRUBINUR NEGATIVE 01/03/2017 1311   KETONESUR NEGATIVE 01/03/2017 1311   PROTEINUR NEGATIVE 01/03/2017 1311   UROBILINOGEN 0.2 04/01/2015 2050   NITRITE NEGATIVE 01/03/2017 1311   LEUKOCYTESUR NEGATIVE 01/03/2017 1311    Sepsis Labs Recent Labs  Lab 02/04/23 0153 02/05/23 0700 02/06/23 0417 02/07/23 0456  WBC 1.6* 1.2* 1.3* 1.2*   Microbiology No results found for this or any previous visit (from the past 240 hour(s)).   Time coordinating discharge:  minutes  SIGNED:   Barb Merino, MD  [facility] 02/07/2023, 6:50 PM

## 2023-02-07 NOTE — Anesthesia Procedure Notes (Signed)
Procedure Name: Intubation Date/Time: 02/07/2023 8:40 AM  Performed by: Wilburn Cornelia, CRNAPre-anesthesia Checklist: Patient identified, Emergency Drugs available, Suction available, Patient being monitored and Timeout performed Patient Re-evaluated:Patient Re-evaluated prior to induction Oxygen Delivery Method: Circle system utilized Preoxygenation: Pre-oxygenation with 100% oxygen Induction Type: IV induction Ventilation: Mask ventilation with difficulty Laryngoscope Size: Mac and 4 Grade View: Grade III Tube type: Oral Tube size: 7.5 mm Number of attempts: 1 Airway Equipment and Method: Stylet Placement Confirmation: ETT inserted through vocal cords under direct vision, positive ETCO2, CO2 detector and breath sounds checked- equal and bilateral Secured at: 23 cm Tube secured with: Tape Dental Injury: Teeth and Oropharynx as per pre-operative assessment

## 2023-02-07 NOTE — Transfer of Care (Signed)
Immediate Anesthesia Transfer of Care Note  Patient: [name]  Procedure(s) Performed: RADIOLOGY WITH ANESTHESIA SVC RECANALIZATION  Patient Location: Radiology  Anesthesia Type:General  Level of Consciousness: awake, alert , and oriented  Airway & Oxygen Therapy: Patient Spontanous Breathing and Patient connected to nasal cannula oxygen  Post-op Assessment: Report given to RN and Post -op Vital signs reviewed and stable  Post vital signs: Reviewed and stable  Last Vitals:  Vitals Value Taken Time  BP 158/72 02/07/23 1046  Temp    Pulse 76 02/07/23 1047  Resp 13 02/07/23 1047  SpO2 92 % 02/07/23 1047  Vitals shown include unvalidated device data.  Last Pain:  Vitals:   02/07/23 0747  TempSrc: Oral  PainSc: 0-No pain         Complications: No notable events documented.

## 2023-02-07 NOTE — Progress Notes (Signed)
DIAGNOSIS: Stage IA (T1a, N0, M0) non-small cell lung cancer, adenocarcinoma diagnosed in January 2018   PRIOR THERAPY: Status post right lower lobe superior segmentectomy with lymph node dissection under the care of Dr. Roxan Hockey.   CURRENT THERAPY: Observation.  Subjective: The patient is seen and examined today.  His wife was at the bedside.  He is feeling fine today with no concerning complaints except for the fatigue and shortness of breath.  He also has some petechiae and bleeding blisters.  He has a history of stage Ia non-small cell lung cancer status post surgical resection more than 5 years ago.  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  He was admitted to the hospital on 02/02/2023 with left eye swelling as well as shortness of breath and swelling of the left upper extremity.  He has been on anticoagulation with Xarelto then Eliquis.  He had a history of alcohol abuse with mild pancytopenia in the past.  On admission on 02/02/2023 his CBC showed significant pancytopenia with total white blood count of 1.5, hemoglobin 15.8, hematocrit 30.2% with MCV of 111.2 and platelet count of 41,000.  His anemia panel was unremarkable.  I recommended for the patient to have a bone marrow biopsy and aspirate which was performed on 02/04/2023 and it showed 40% CD34 positive blasts and the molecular studies with FISH analysis for PML/RARA t(15;17) was not detected.  The patient denied having any current chest pain or hemoptysis.  He has no nausea, vomiting, diarrhea or constipation.  He has no fever or chills.  He underwent angioplasty with ballooning of the left subclavian vein earlier today.  Objective: Vital signs in last 24 hours: Temp:  [97.9 F (36.6 C)-98.8 F (37.1 C)] 98.2 F (36.8 C) (02/19 1300) Pulse Rate:  [68-86] 82 (02/19 1512) Resp:  [13-23] 13 (02/19 1512) BP:  (121-167)/(50-89) 161/70 (02/19 1300) SpO2:  [91 %-100 %] 91 % (02/19 1512) Arterial Line BP: (153-165)/(51-74) 153/54 (02/19 1512)  Intake/Output from previous day: No intake/output data recorded. Intake/Output this shift: Total I/O In: 1090 [P.O.:240; I.V.:850] Out: -   General appearance: alert, cooperative, fatigued, and no distress Resp: clear to auscultation bilaterally Cardio: regular rate and rhythm, S1, S2 normal, no murmur, click, rub or gallop GI: soft, non-tender; bowel sounds normal; no masses,  no organomegaly Extremities: Mild edema in the left upper extremity with bruising from the intervention earlier today  Lab Results:  Recent Labs    02/06/23 0417 02/07/23 0456  WBC 1.3* 1.2*  HGB 9.3* 8.6*  HCT 26.6* 23.6*  PLT 32* 28*   BMET Recent Labs    02/06/23 0417 02/07/23 0456  NA 135 136  K 4.5 3.8  CL 101 102  CO2 25 22  GLUCOSE 96 103*  BUN 8 8  CREATININE 0.77 0.74  CALCIUM 9.1 8.7*    Studies/Results: IR US Guide Vasc Access Right  Result Date: 02/07/2023 INDICATION: 77 year old male with history of lung cancer status post radiation therapy to the left apex 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 The patient presents with new onset left upper extremity swelling with imaging evidence of stent occlusion. Recent diagnosis of AML. EXAM: 1. Ultrasound-guided vascular access of the left brachial, right basilic, and right common femoral veins. 2. Left upper extremity and central venogram. 3. Plain balloon angioplasty of left innominate vein. 4. Drug coated  balloon angioplasty of left innominate vein. COMPARISON:  02/03/2023 MEDICATIONS: None. ANESTHESIA/SEDATION: The procedure was performed under general anesthesia. FLUOROSCOPY TIME:  One hundred twenty-five mGy CONTRAST:  50 mL Omnipaque 350, intravenous COMPLICATIONS: None immediate. TECHNIQUE: Informed written consent was obtained from the patient after a  thorough discussion of the procedural risks, benefits and alternatives. All questions were addressed. Maximal Sterile Barrier Technique was utilized including caps, mask, sterile gowns, sterile gloves, sterile drape, hand hygiene and skin antiseptic. A timeout was performed prior to the initiation of the procedure. The bilateral upper extremities and right coronary prepped and draped in standard fashion. Preprocedure ultrasound evaluation of the left upper extremity demonstrated patent brachial vein. Under direct ultrasound visualization, the left brachial vein was accessed with a 21 gauge micropuncture needle. A permanent ultrasound image was captured and stored in the record. A micropuncture sheath was introduced followed by placement of a Wholey wire which was directed toward the axillary vein. Next, a 51 [place], angled destination sheath was inserted and positioned with the tip in the left axillary vein. Gentle hand injection of contrast demonstrated intraluminal position and persistent stent occlusion. Preprocedure ultrasound evaluation of the right upper extremity demonstrated patent basilic vein. Under direct ultrasound visualization, the right basilic vein was accessed with a 21 gauge micropuncture needle. A permanent ultrasound image was captured and stored in the record. A micropuncture sheath was introduced followed by placement of a Wholey wire which was directed toward the axillary vein. Next, a 21 [place], angled destination sheath was inserted and positioned with the tip in the right innominate vein. A rim catheter was then inserted and used to select the left innominate vein. Simultaneous venogram was performed via injection through both access sites. Venogram was consistent for complete, chronic appearing occlusion of the indwelling left subclavian vein stents with multiple mature collateral veins draining via the intercostals and left cervical region. Next, attempts were made from the left upper  extremity access to recanalized the occluded stent within Navicross catheter and stiff Glidewire which were unsuccessful. Therefore, attempts were made recanalization from a retrograde approach. From the right upper extremity, wire purchase into the left innominate vein for recanalization purposes was inadequate despite multiple catheter and wire combinations. Therefore, the right common femoral vein was accessed under direct ultrasound visualization with a 21 gauge micropuncture needle. A permanent image was captured and stored in the record. A Wholey wire was inserted level of the superior vena cava and a 6 [place], 55 cm vascular sheath was placed and positioned in the superior vena cava the left innominate vein was then selected with the Wholey wire and a Navicross catheter. The catheter and wire engage the central aspect of the indwelling stent but was unable to be traversed further. Additional recanalization attempts with a stiff glidewire were also unsuccessful. Repeat left innominate venogram demonstrated drainage from what is likely a left external jugular vein via the uncovered, central most indwelling stent in addition to collateral drainage inferiorly. Incidentally noted, there was high-grade focal stenosis of the midportion left innominate vein. Therefore, balloon angioplasty was performed with a 7 mm x 4 mm Athletis balloon. Post angioplasty venogram demonstrated moderately improved patency and antegrade flow. Next, drug coated balloon angioplasty was performed with a 7 mm x 8 cm InPACT DCB with prolonged inflation for 3 minutes. Completion left innominate venogram demonstrated improved patency inflow without evidence of extravasation or other complicating features. The sheaths were removed and hemostasis was achieved with brief manual compression at each venous access site.  Sterile bandages were applied. The patient tolerated the procedure well was transferred to the recovery area under the direct care  of the Anesthesia team. IMPRESSION: 1. 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 2. 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 3. Unsuccessful recanalization of the indwelling, chronically occluded left subclavian vein stents. 4. Technically successful plain and drug coated balloon angioplasty of the left innominate vein stenosis with improved patency and flow. Ruthann Cancer, MD [facility] Radiology Electronically Signed   By: Ruthann Cancer M.D.   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 The patient presents with new onset left upper extremity swelling with imaging evidence of stent occlusion. Recent diagnosis of AML. EXAM: 1. Ultrasound-guided vascular access of the left brachial, right basilic, and right common femoral veins. 2. Left upper extremity and central venogram. 3. Plain balloon angioplasty of left innominate vein. 4. Drug coated balloon angioplasty of left innominate vein. COMPARISON:  02/03/2023 MEDICATIONS: None. ANESTHESIA/SEDATION: The procedure was performed under general anesthesia. FLUOROSCOPY TIME:  One hundred twenty-five mGy CONTRAST:  50 mL Omnipaque 350, intravenous COMPLICATIONS: None immediate. TECHNIQUE: Informed written consent was obtained from the patient after a thorough discussion of the procedural risks, benefits and alternatives. All questions were addressed. Maximal Sterile Barrier Technique was utilized including caps, mask, sterile gowns, sterile gloves, sterile drape, hand hygiene and skin antiseptic. A timeout was performed prior to the initiation of the procedure. The bilateral upper extremities and right coronary prepped and draped in standard  fashion. Preprocedure ultrasound evaluation of the left upper extremity demonstrated patent brachial vein. Under direct ultrasound visualization, the left brachial vein was accessed with a 21 gauge micropuncture needle. A permanent ultrasound image was captured and stored in the record. A micropuncture sheath was introduced followed by placement of a Wholey wire which was directed toward the axillary vein. Next, a 37 [place], angled destination sheath was inserted and positioned with the tip in the left axillary vein. Gentle hand injection of contrast demonstrated intraluminal position and persistent stent occlusion. Preprocedure ultrasound evaluation of the right upper extremity demonstrated patent basilic vein. Under direct ultrasound visualization, the right basilic vein was accessed with a 21 gauge micropuncture needle. A permanent ultrasound image was captured and stored in the record. A micropuncture sheath was introduced followed by placement of a Wholey wire which was directed toward the axillary vein. Next, a 48 [place], angled destination sheath was inserted and positioned with the tip in the right innominate vein. A rim catheter was then inserted and used to select the left innominate vein. Simultaneous venogram was performed via injection through both access sites. Venogram was consistent for complete, chronic appearing occlusion of the indwelling left subclavian vein stents with multiple mature collateral veins draining via the intercostals and left cervical region. Next, attempts were made from the left upper extremity access to recanalized the occluded stent within Navicross catheter and stiff Glidewire which were unsuccessful. Therefore, attempts were made recanalization from a retrograde approach. From the right upper extremity, wire purchase into the left innominate vein for recanalization purposes was inadequate despite multiple catheter and wire combinations. Therefore, the right common femoral vein  was accessed under direct ultrasound visualization with a 21 gauge micropuncture needle. A permanent image was  captured and stored in the record. A Wholey wire was inserted level of the superior vena cava and a 6 [place], 55 cm vascular sheath was placed and positioned in the superior vena cava the left innominate vein was then selected with the Wholey wire and a Navicross catheter. The catheter and wire engage the central aspect of the indwelling stent but was unable to be traversed further. Additional recanalization attempts with a stiff glidewire were also unsuccessful. Repeat left innominate venogram demonstrated drainage from what is likely a left external jugular vein via the uncovered, central most indwelling stent in addition to collateral drainage inferiorly. Incidentally noted, there was high-grade focal stenosis of the midportion left innominate vein. Therefore, balloon angioplasty was performed with a 7 mm x 4 mm Athletis balloon. Post angioplasty venogram demonstrated moderately improved patency and antegrade flow. Next, drug coated balloon angioplasty was performed with a 7 mm x 8 cm InPACT DCB with prolonged inflation for 3 minutes. Completion left innominate venogram demonstrated improved patency inflow without evidence of extravasation or other complicating features. The sheaths were removed and hemostasis was achieved with brief manual compression at each venous access site. Sterile bandages were applied. The patient tolerated the procedure well was transferred to the recovery area under the direct care of the Anesthesia team. IMPRESSION: 1. 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 2. 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 3. Unsuccessful recanalization of the indwelling, chronically occluded left subclavian vein stents. 4. Technically successful plain and drug coated balloon angioplasty of the left innominate vein stenosis with improved patency and  flow. Ruthann Cancer, MD [facility] Radiology Electronically Signed   By: Ruthann Cancer M.D.   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 The patient presents with new onset left upper extremity swelling with imaging evidence of stent occlusion. Recent diagnosis of AML. EXAM: 1. Ultrasound-guided vascular access of the left brachial, right basilic, and right common femoral veins. 2. Left upper extremity and central venogram. 3. Plain balloon angioplasty of left innominate vein. 4. Drug coated balloon angioplasty of left innominate vein. COMPARISON:  02/03/2023 MEDICATIONS: None. ANESTHESIA/SEDATION: The procedure was performed under general anesthesia. FLUOROSCOPY TIME:  One hundred twenty-five mGy CONTRAST:  50 mL Omnipaque 350, intravenous COMPLICATIONS: None immediate. TECHNIQUE: Informed written consent was obtained from the patient after a thorough discussion of the procedural risks, benefits and alternatives. All questions were addressed. Maximal Sterile Barrier Technique was utilized including caps, mask, sterile gowns, sterile gloves, sterile drape, hand hygiene and skin antiseptic. A timeout was performed prior to the initiation of the procedure. The bilateral upper extremities and right coronary prepped and draped in standard fashion. Preprocedure ultrasound evaluation of the left upper extremity demonstrated patent brachial vein. Under direct ultrasound visualization, the left brachial vein was accessed with a 21 gauge micropuncture needle. A permanent ultrasound image was captured and stored in the record. A micropuncture sheath was introduced followed by placement of a Wholey wire which was directed toward the axillary  vein. Next, a 51 [place], angled destination sheath was inserted and positioned with the tip in the left axillary vein. Gentle hand injection of contrast demonstrated intraluminal position and persistent stent occlusion. Preprocedure ultrasound evaluation of the right upper extremity demonstrated patent  basilic vein. Under direct ultrasound visualization, the right basilic vein was accessed with a 21 gauge micropuncture needle. A permanent ultrasound image was captured and stored in the record. A micropuncture sheath was introduced followed by placement of a Wholey wire which was directed toward the axillary vein. Next, a 10 [place], angled destination sheath was inserted and positioned with the tip in the right innominate vein. A rim catheter was then inserted and used to select the left innominate vein. Simultaneous venogram was performed via injection through both access sites. Venogram was consistent for complete, chronic appearing occlusion of the indwelling left subclavian vein stents with multiple mature collateral veins draining via the intercostals and left cervical region. Next, attempts were made from the left upper extremity access to recanalized the occluded stent within Navicross catheter and stiff Glidewire which were unsuccessful. Therefore, attempts were made recanalization from a retrograde approach. From the right upper extremity, wire purchase into the left innominate vein for recanalization purposes was inadequate despite multiple catheter and wire combinations. Therefore, the right common femoral vein was accessed under direct ultrasound visualization with a 21 gauge micropuncture needle. A permanent image was captured and stored in the record. A Wholey wire was inserted level of the superior vena cava and a 6 [place], 55 cm vascular sheath was placed and positioned in the superior vena cava the left innominate vein was then selected with the Wholey wire and a Navicross catheter. The catheter and  wire engage the central aspect of the indwelling stent but was unable to be traversed further. Additional recanalization attempts with a stiff glidewire were also unsuccessful. Repeat left innominate venogram demonstrated drainage from what is likely a left external jugular vein via the uncovered, central most indwelling stent in addition to collateral drainage inferiorly. Incidentally noted, there was high-grade focal stenosis of the midportion left innominate vein. Therefore, balloon angioplasty was performed with a 7 mm x 4 mm Athletis balloon. Post angioplasty venogram demonstrated moderately improved patency and antegrade flow. Next, drug coated balloon angioplasty was performed with a 7 mm x 8 cm InPACT DCB with prolonged inflation for 3 minutes. Completion left innominate venogram demonstrated improved patency inflow without evidence of extravasation or other complicating features. The sheaths were removed and hemostasis was achieved with brief manual compression at each venous access site. Sterile bandages were applied. The patient tolerated the procedure well was transferred to the recovery area under the direct care of the Anesthesia team. IMPRESSION: 1. 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 2. 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 3. Unsuccessful recanalization of the indwelling, chronically occluded left subclavian vein stents. 4. Technically successful plain and drug coated balloon angioplasty of the left innominate vein stenosis with improved patency and flow. Ruthann Cancer, MD [facility] Radiology Electronically Signed   By: Ruthann Cancer M.D.   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 The patient presents with new onset left upper extremity swelling with imaging evidence of stent occlusion. Recent diagnosis of AML. EXAM: 1. Ultrasound-guided vascular access of the left brachial, right basilic, and right common femoral veins. 2. Left upper extremity and central venogram. 3. Plain balloon angioplasty of left innominate vein. 4. Drug coated balloon angioplasty of left innominate vein. COMPARISON:  02/03/2023 MEDICATIONS:  None. ANESTHESIA/SEDATION: The procedure was performed under general anesthesia. FLUOROSCOPY TIME:  One hundred twenty-five mGy CONTRAST:  50 mL Omnipaque 350, intravenous COMPLICATIONS: None immediate. TECHNIQUE: Informed written consent was obtained from the patient after a thorough discussion of the procedural risks, benefits and alternatives. All questions were addressed. Maximal Sterile Barrier Technique was utilized including caps, mask, sterile gowns, sterile gloves, sterile drape, hand hygiene and skin antiseptic. A timeout was performed prior to the initiation of the procedure. The bilateral upper extremities and right coronary prepped and draped in standard fashion. Preprocedure ultrasound evaluation of the left upper extremity demonstrated patent brachial vein. Under direct ultrasound visualization, the left brachial vein was accessed with a 21 gauge micropuncture needle. A permanent ultrasound image was captured and stored in the record. A micropuncture sheath was introduced followed by placement of a Wholey wire which was directed toward the axillary vein. Next, a 67 [place], angled destination sheath was inserted and positioned with the tip in the left axillary vein. Gentle hand injection of contrast demonstrated intraluminal position and persistent stent occlusion. Preprocedure ultrasound evaluation of the right upper extremity demonstrated patent basilic vein. Under direct ultrasound visualization, the right basilic vein was accessed with a 21 gauge micropuncture  needle. A permanent ultrasound image was captured and stored in the record. A micropuncture sheath was introduced followed by placement of a Wholey wire which was directed toward the axillary vein. Next, a 75 [place], angled destination sheath was inserted and positioned with the tip in the right innominate vein. A rim catheter was then inserted and used to select the left innominate vein. Simultaneous venogram was performed via injection through both access sites. Venogram was consistent for complete, chronic appearing occlusion of the indwelling left subclavian vein stents with multiple mature collateral veins draining via the intercostals and left cervical region. Next, attempts were made from the left upper extremity access to recanalized the occluded stent within Navicross catheter and stiff Glidewire which were unsuccessful. Therefore, attempts were made recanalization from a retrograde approach. From the right upper extremity, wire purchase into the left innominate vein for recanalization purposes was inadequate despite multiple catheter and wire combinations. Therefore, the right common femoral vein was accessed under direct ultrasound visualization with a 21 gauge micropuncture needle. A permanent image was captured and stored in the record. A Wholey wire was inserted level of the superior vena cava and a 6 [place], 55 cm vascular sheath was placed and positioned in the superior vena cava the left innominate vein was then selected with the Wholey wire and a Navicross catheter. The catheter and wire engage the central aspect of the indwelling stent but was unable to be traversed further. Additional recanalization attempts with a stiff glidewire were also unsuccessful. Repeat left innominate venogram demonstrated drainage from what is likely a left external jugular vein via the uncovered, central most indwelling stent in addition to collateral drainage inferiorly. Incidentally noted, there was high-grade focal  stenosis of the midportion left innominate vein. Therefore, balloon angioplasty was performed with a 7 mm x 4 mm Athletis balloon. Post angioplasty venogram demonstrated moderately improved patency and antegrade flow. Next, drug coated balloon angioplasty was performed with a 7 mm x 8 cm InPACT DCB with prolonged inflation for 3 minutes. Completion left innominate venogram demonstrated improved patency inflow without evidence of extravasation or other complicating features. The sheaths were removed and hemostasis was achieved with brief manual compression at each venous access site. Sterile bandages were applied. The patient tolerated the procedure well  was transferred to the recovery area under the direct care of the Anesthesia team. IMPRESSION: 1. 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 2. 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 3. Unsuccessful recanalization of the indwelling, chronically occluded left subclavian vein stents. 4. Technically successful plain and drug coated balloon angioplasty of the left innominate vein stenosis with improved patency and flow. Ruthann Cancer, MD [facility] Radiology Electronically Signed   By: Ruthann Cancer M.D.   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 The patient presents with new onset left upper extremity swelling with imaging evidence of stent occlusion. Recent diagnosis of AML. EXAM: 1. Ultrasound-guided vascular access of the left brachial, right basilic, and right common femoral veins. 2. Left upper extremity and central venogram. 3. Plain balloon angioplasty of left innominate vein. 4. Drug coated balloon angioplasty of left  innominate vein. COMPARISON:  02/03/2023 MEDICATIONS: None. ANESTHESIA/SEDATION: The procedure was performed under general anesthesia. FLUOROSCOPY TIME:  One hundred twenty-five mGy CONTRAST:  50 mL Omnipaque 350, intravenous COMPLICATIONS: None immediate. TECHNIQUE: Informed written consent was obtained from the patient after a thorough discussion of the procedural risks, benefits and alternatives. All questions were addressed. Maximal Sterile Barrier Technique was utilized including caps, mask, sterile gowns, sterile gloves, sterile drape, hand hygiene and skin antiseptic. A timeout was performed prior to the initiation of the procedure. The bilateral upper extremities and right coronary prepped and draped in standard fashion. Preprocedure ultrasound evaluation of the left upper extremity demonstrated patent brachial vein. Under direct ultrasound visualization, the left brachial vein was accessed with a 21 gauge micropuncture needle. A permanent ultrasound image was captured and stored in the record. A micropuncture sheath was introduced followed by placement of a Wholey wire which was directed toward the axillary vein. Next, a 92 [place], angled destination sheath was inserted and positioned with the tip in the left axillary vein. Gentle hand injection of contrast demonstrated intraluminal position and persistent stent occlusion. Preprocedure ultrasound evaluation of the right upper extremity demonstrated patent basilic vein. Under direct ultrasound visualization, the right basilic vein was accessed with a 21 gauge micropuncture needle. A permanent ultrasound image was captured and stored in the record. A micropuncture sheath was introduced followed by placement of a Wholey wire which was directed toward the axillary vein. Next, a 22 [place], angled destination sheath was inserted and positioned with the tip in the right innominate vein. A rim catheter was then inserted and used to select the left innominate vein.  Simultaneous venogram was performed via injection through both access sites. Venogram was consistent for complete, chronic appearing occlusion of the indwelling left subclavian vein stents with multiple mature collateral veins draining via the intercostals and left cervical region. Next, attempts were made from the left upper extremity access to recanalized the occluded stent within Navicross catheter and stiff Glidewire which were unsuccessful. Therefore, attempts were made recanalization from a retrograde approach. From the right upper extremity, wire purchase into the left innominate vein for recanalization purposes was inadequate despite multiple catheter and wire combinations. Therefore, the right common femoral vein was accessed under direct ultrasound visualization with a 21 gauge micropuncture needle. A permanent image was captured and stored in the record. A Wholey wire was inserted  level of the superior vena cava and a 6 [place], 55 cm vascular sheath was placed and positioned in the superior vena cava the left innominate vein was then selected with the Wholey wire and a Navicross catheter. The catheter and wire engage the central aspect of the indwelling stent but was unable to be traversed further. Additional recanalization attempts with a stiff glidewire were also unsuccessful. Repeat left innominate venogram demonstrated drainage from what is likely a left external jugular vein via the uncovered, central most indwelling stent in addition to collateral drainage inferiorly. Incidentally noted, there was high-grade focal stenosis of the midportion left innominate vein. Therefore, balloon angioplasty was performed with a 7 mm x 4 mm Athletis balloon. Post angioplasty venogram demonstrated moderately improved patency and antegrade flow. Next, drug coated balloon angioplasty was performed with a 7 mm x 8 cm InPACT DCB with prolonged inflation for 3 minutes. Completion left innominate venogram demonstrated  improved patency inflow without evidence of extravasation or other complicating features. The sheaths were removed and hemostasis was achieved with brief manual compression at each venous access site. Sterile bandages were applied. The patient tolerated the procedure well was transferred to the recovery area under the direct care of the Anesthesia team. IMPRESSION: 1. 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 2. 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 3. Unsuccessful recanalization of the indwelling, chronically occluded left subclavian vein stents. 4. Technically successful plain and drug coated balloon angioplasty of the left innominate vein stenosis with improved patency and flow. Ruthann Cancer, MD [facility] Radiology Electronically Signed   By: Ruthann Cancer M.D.   On: 02/07/2023 11:18    Medications: I have reviewed the patient's current medications.  CODE STATUS: Full code  Assessment/Plan: This is a very pleasant 77 years old white male who is under my care for stage Ia non-small cell lung cancer status postsurgical resection and has been on observation for the last 5 years. The patient was admitted to the hospital with left subclavian vein thrombosis and swelling of the left upper extremity as well as the left eye but during his evaluation he was found to have pancytopenia. 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 The cytogenetic analysis still pending but no evidence for PML/RARA I had a lengthy discussion with the patient and his wife today about his current condition and treatment options. I explained to the patient that unfortunately we do not treat acute myeloid leukemia at the [facility] health system and he may need to be transferred to a tertiary center for treatment of his condition. I will call the hematology  service at [facility] and discussed with him the case to see if he will need to be transferred from the hospital or seen as outpatient basis after discharge. I do not think the patient will need to stay in the hospital further after his angioplasty of the left subclavian unless we plan for hospital to hospital transfer.  I spoke to Dr. Phill Myron with [facility] hematology service and she accepted the patient for transfer.  There was no bed available for transfer tonight but he is currently on the waiting list for transfer once a bed becomes available hopefully tomorrow. The patient had pancytopenia and low platelets count with few hemorrhagic blisters. Will continue to monitor his counts closely and transfuse him if needed. I will  not start the patient on any growth factor right now for his total white blood count. Thank you so much for taking good care of Mr. Symanski, I will continue to follow the patient with you and monitor his condition until we transfer him to the tertiary center.   LOS: 5 days    Eilleen Kempf 02/07/2023

## 2023-02-08 ENCOUNTER — Encounter (HOSPITAL_COMMUNITY): Payer: Self-pay | Admitting: Radiology

## 2023-02-08 LAB — CBC WITH DIFFERENTIAL/PLATELET
Abs Immature Granulocytes: 0 10*3/uL (ref 0.00–0.07)
Basophils Absolute: 0 10*3/uL (ref 0.0–0.1)
Basophils Relative: 0 %
Eosinophils Absolute: 0 10*3/uL (ref 0.0–0.5)
Eosinophils Relative: 1 %
HCT: 22.2 % — ABNORMAL LOW (ref 39.0–52.0)
Hemoglobin: 7.8 g/dL — ABNORMAL LOW (ref 13.0–17.0)
Immature Granulocytes: 0 %
Lymphocytes Relative: 66 %
Lymphs Abs: 0.7 10*3/uL (ref 0.7–4.0)
MCH: 39.6 pg — ABNORMAL HIGH (ref 26.0–34.0)
MCHC: 35.1 g/dL (ref 30.0–36.0)
MCV: 112.7 fL — ABNORMAL HIGH (ref 80.0–100.0)
Monocytes Absolute: 0.2 10*3/uL (ref 0.1–1.0)
Monocytes Relative: 15 %
Neutro Abs: 0.2 10*3/uL — CL (ref 1.7–7.7)
Neutrophils Relative %: 18 %
Platelets: 28 10*3/uL — CL (ref 150–400)
RBC: 1.97 MIL/uL — ABNORMAL LOW (ref 4.22–5.81)
RDW: 15.2 % (ref 11.5–15.5)
WBC: 1.1 10*3/uL — CL (ref 4.0–10.5)
nRBC: 0 % (ref 0.0–0.2)

## 2023-02-08 LAB — PATHOLOGIST SMEAR REVIEW

## 2023-02-08 LAB — SURGICAL PATHOLOGY

## 2023-02-08 LAB — HEPARIN LEVEL (UNFRACTIONATED): Heparin Unfractionated: 0.35 IU/mL (ref 0.30–0.70)

## 2023-02-08 NOTE — Progress Notes (Signed)
Report called and given to [facility] RN at [facility]. [facility] Rm [id number]

## 2023-02-08 NOTE — Progress Notes (Signed)
ANTICOAGULATION CONSULT NOTE  Pharmacy Consult for Heparin Indication: hx left arm subclavian vein thrombosis, new acute left subclavian vein thrombus (PTA Eliquis on hold)   No Known Allergies  Patient Measurements: Height: 5\' 9"  (175.3 cm) Weight: 92.3 kg (203 lb 7.8 oz) IBW/kg (Calculated) : 70.7 Heparin Dosing Weight: 90 kg  Vital Signs: Temp: 98.4 F (36.9 C) (02/20 0314) Temp Source: Oral (02/20 0314) BP: 127/60 (02/20 0314) Pulse Rate: 71 (02/20 0314)  Labs: Recent Labs    02/06/23 0417 02/07/23 0456 02/08/23 0525  HGB 9.3* 8.6* 7.8*  HCT 26.6* 23.6* 22.2*  PLT 32* 28* 28*  HEPARINUNFRC 0.30 0.24* 0.35  CREATININE 0.77 0.74  --     Estimated Creatinine Clearance: 89.5 mL/min (by C-G formula based on SCr of 0.74 mg/dL).  Assessment: Patient is a 77 y.o M with hx NSCLC and hx of subclavian vein thrombosis in 2007 on Eliquis 2.5 mg bid (last dose taken on 2/14 AM). Pt's wife reported that he was started on warfarin after the LUE DVT in 2007 and switched over to Eliquis by his PCP last year. She also states that he is compliant with his Eliquis and has not missed any doses at home. Patient presented to the ED on 2/14 with c/o left arm swelling. Left UE doppler showed thrombus within the left subclavian vein. Pharmacy consulted to manage heparin.    Patient went to IR 2/19 for angioplasty. Heparin was continued throughout duration of procedure. Per 2/19 PM RN's note, x1 episode of increased bleeding from the incision site which stopped after applied pressure for 10 min and dressing changed. Per 2/20 AM RN, no further issues were noted. Hgb down 8.6 > 7.8, platelets remain low stable at 28.   Goal of Therapy:  Heparin level 0.3-0.7 units/ml aPTT 66-102 seconds Monitor platelets by anticoagulation protocol: Yes   Plan:  Continue heparin infusion at 1700 units/hr Check heparin level in 8 hours and daily while on heparin Continue to monitor H&H and platelets  Thank you  for allowing pharmacy to be a part of this patient's care.  Ardyth Harps, PharmD Clinical Pharmacist

## 2023-02-08 NOTE — Progress Notes (Signed)
Referring Physician(s): Dr Mayme Genta and Dr Raelyn Mora  Supervising Physician: Ruthann Cancer  Patient Status:  [name] - In-pt  Chief Complaint:  Left subclavian vein stent occlusion  Subjective:   Procedure: 02/07/23 1) Left upper extremity and central venogram 2) Left innominate vein angioplasty 3) Drug coated balloon angioplasty of left innominate vein   Findings: Please refer to procedural dictation for full description.  Chronically occluded stent, unable to recanalize from antegrade and retrograde approaches.  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  Right basilic, left brachial, and right common femoral venous accesses.  Pt is up in bed Doing well No complaints No pain Left arm feels some better today   Allergies: Patient has no known allergies.  Medications: Prior to Admission medications   Medication Sig Start Date End Date Taking? Authorizing Provider  albuterol (VENTOLIN HFA) 108 (90 Base) MCG/ACT inhaler Inhale 1-2 puffs into the lungs every 4 (four) hours as needed for wheezing or shortness of breath.   Yes [provider]  Bacillus Coagulans-Inulin (PROBIOTIC) 1-250 BILLION-MG CAPS Take 1 capsule by mouth daily.   Yes [provider]  Brimonidine Tartrate (LUMIFY) 0.025 % SOLN Place 1 drop into both eyes daily as needed (redness).   Yes [provider]  Calcium-Phosphorus-Vitamin D (CALCIUM/D3 ADULT GUMMIES PO) Take 1 tablet by mouth 2 (two) times daily. Gummy has 500 mg of calcium, D3 1000 units, zinc 7 mg   Yes [provider]  Carboxymethylcellulose Sodium (REFRESH LIQUIGEL) 1 % GEL Place 1 drop into both eyes 2 (two) times daily as needed (dryness).   Yes [provider]  carvedilol (COREG) 3.125 MG tablet Take 3.125 mg by mouth 2 (two) times daily with a meal.   Yes [provider]  ELIQUIS 2.5 MG TABS tablet Take  2.5 mg by mouth 2 (two) times daily. 06/09/22  Yes [provider]  latanoprost (XALATAN) 0.005 % ophthalmic solution Place 1 drop into both eyes at bedtime.   Yes [provider]  loperamide (IMODIUM A-D) 2 MG tablet Take 2 mg by mouth as needed for diarrhea or loose stools.   Yes [provider]  losartan (COZAAR) 50 MG tablet Take 50 mg by mouth daily. 04/25/21  Yes [provider]  Multiple Vitamin (MULTIVITAMIN WITH MINERALS) TABS tablet Take 1 tablet by mouth 2 (two) times daily.   Yes [provider]  rosuvastatin (CRESTOR) 10 MG tablet Take 10 mg by mouth daily. 07/10/20  Yes [provider]  SPIRIVA HANDIHALER 18 MCG inhalation capsule Place 18 mcg into inhaler and inhale daily. 08/26/20  Yes [provider]  heparin 25000 UT/250ML infusion Inject 1,700 Units/hr into the vein continuous. 02/07/23   Barb Merino, MD     Vital Signs: BP (!) 146/79 (BP Location: Left Leg)   Pulse 78   Temp 97.9 F (36.6 C) (Axillary) Comment (Src): Pt drinking coffee  Resp 18   Ht 5\' 9"  (1.753 m)   Wt 203 lb 7.8 oz (92.3 kg)   SpO2 100%   BMI 30.05 kg/m   Physical Exam Musculoskeletal:        General: Normal range of motion.     Comments: Left arm seems smaller to pt today Less swelling Moving all 4s   Skin:    General: Skin is warm.     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 The patient presents with new onset left upper extremity swelling with imaging evidence of stent occlusion. Recent diagnosis of AML. EXAM: 1.  Ultrasound-guided vascular access of the left brachial, right basilic, and right common femoral veins. 2. Left upper extremity and central venogram. 3. Plain balloon angioplasty of left innominate vein. 4. Drug coated balloon angioplasty of left innominate vein. COMPARISON:  02/03/2023 MEDICATIONS: None. ANESTHESIA/SEDATION: The procedure was performed under general anesthesia. FLUOROSCOPY TIME:  One hundred twenty-five mGy CONTRAST:  50 mL Omnipaque 350, intravenous COMPLICATIONS: None immediate. TECHNIQUE: Informed written consent was obtained from the patient after a thorough discussion of the procedural risks, benefits and alternatives. All questions were addressed. Maximal Sterile Barrier Technique was utilized including caps, mask, sterile gowns, sterile gloves, sterile drape, hand hygiene and skin antiseptic. A timeout was performed prior to the initiation of the procedure. The bilateral upper extremities and right coronary prepped and draped in standard fashion. Preprocedure ultrasound evaluation of the left upper extremity demonstrated patent brachial vein. Under direct ultrasound visualization, the left brachial vein was accessed with a 21 gauge micropuncture needle. A permanent ultrasound image was captured and stored in the record. A micropuncture sheath was introduced followed by placement of a Wholey wire which was directed toward the axillary vein. Next, a 39 [place], angled destination sheath was inserted and positioned with the tip in the left axillary vein. Gentle hand injection of contrast demonstrated intraluminal position and persistent stent occlusion. Preprocedure ultrasound evaluation of the right upper extremity demonstrated patent basilic vein. Under direct ultrasound visualization, the right basilic vein was accessed with a 21 gauge micropuncture needle. A permanent ultrasound image was captured and stored in the record. A micropuncture sheath was introduced followed by placement of a Wholey  wire which was directed toward the axillary vein. Next, a 76 [place], angled destination sheath was inserted and positioned with the tip in the right innominate vein. A rim catheter was then inserted and used to select the left innominate vein. Simultaneous venogram was performed via injection through both access sites. Venogram was consistent for complete, chronic appearing occlusion of the indwelling left subclavian vein stents with multiple mature collateral veins draining via the intercostals and left cervical region. Next, attempts were made from the left upper extremity access to recanalized the occluded stent within Navicross catheter and stiff Glidewire which were unsuccessful. Therefore, attempts were made recanalization from a retrograde approach. From the right upper extremity, wire purchase into the left innominate vein for recanalization purposes was inadequate despite multiple catheter and wire combinations. Therefore, the right common femoral vein was accessed under direct ultrasound visualization with a 21 gauge micropuncture needle. A permanent image was captured and stored in the record. A Wholey wire was inserted level of the superior vena cava and a 6 [place], 55 cm vascular sheath was placed and positioned in the superior vena cava the left innominate vein was then selected with the Wholey wire and a Navicross catheter. The catheter and wire engage the central aspect of the indwelling stent but was unable to be traversed further. Additional recanalization attempts with a stiff glidewire were also  unsuccessful. Repeat left innominate venogram demonstrated drainage from what is likely a left external jugular vein via the uncovered, central most indwelling stent in addition to collateral drainage inferiorly. Incidentally noted, there was high-grade focal stenosis of the midportion left innominate vein. Therefore, balloon angioplasty was performed with a 7 mm x 4 mm Athletis balloon. Post angioplasty  venogram demonstrated moderately improved patency and antegrade flow. Next, drug coated balloon angioplasty was performed with a 7 mm x 8 cm InPACT DCB with prolonged inflation for 3 minutes. Completion left innominate venogram demonstrated improved patency inflow without evidence of extravasation or other complicating features. The sheaths were removed and hemostasis was achieved with brief manual compression at each venous access site. Sterile bandages were applied. The patient tolerated the procedure well was transferred to the recovery area under the direct care of the Anesthesia team. IMPRESSION: 1. 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 2. 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 3. Unsuccessful recanalization of the indwelling, chronically occluded left subclavian vein stents. 4. Technically successful plain and drug coated balloon angioplasty of the left innominate vein stenosis with improved patency and flow. Ruthann Cancer, MD [facility] Radiology Electronically Signed   By: Ruthann Cancer M.D.   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 The patient presents with new onset left upper extremity swelling with imaging evidence of stent occlusion. Recent diagnosis of AML. EXAM: 1. Ultrasound-guided vascular access of the left brachial, right basilic, and right common femoral veins. 2. Left upper extremity and central venogram. 3. Plain balloon angioplasty of left innominate vein. 4. Drug coated balloon angioplasty of left innominate vein. COMPARISON:  02/03/2023 MEDICATIONS: None. ANESTHESIA/SEDATION: The procedure was performed under general anesthesia. FLUOROSCOPY TIME:  One  hundred twenty-five mGy CONTRAST:  50 mL Omnipaque 350, intravenous COMPLICATIONS: None immediate. TECHNIQUE: Informed written consent was obtained from the patient after a thorough discussion of the procedural risks, benefits and alternatives. All questions were addressed. Maximal Sterile Barrier Technique was utilized including caps, mask, sterile gowns, sterile gloves, sterile drape, hand hygiene and skin antiseptic. A timeout was performed prior to the initiation of the procedure. The bilateral upper extremities and right coronary prepped and draped in standard fashion. Preprocedure ultrasound evaluation of the left upper extremity demonstrated patent brachial vein. Under direct ultrasound visualization, the left brachial vein was accessed with a 21 gauge micropuncture needle. A permanent ultrasound image was captured and stored in the record. A micropuncture sheath was introduced followed by placement of a Wholey wire which was directed toward the axillary vein. Next, a 70 [place], angled destination sheath was inserted and positioned with the tip in the left axillary vein. Gentle hand injection of contrast demonstrated intraluminal position and persistent stent occlusion. Preprocedure ultrasound evaluation of the right upper extremity demonstrated patent basilic vein. Under direct ultrasound visualization, the right basilic vein was accessed with a 21 gauge micropuncture needle. A permanent ultrasound image was captured and stored in the record. A micropuncture sheath was introduced followed by placement of a Wholey wire which was directed toward the axillary vein. Next, a 75 [place], angled destination sheath was inserted and positioned with the tip in the right innominate vein. A rim catheter was then inserted and used to select  the left innominate vein. Simultaneous venogram was performed via injection through both access sites. Venogram was consistent for complete, chronic appearing occlusion of the indwelling  left subclavian vein stents with multiple mature collateral veins draining via the intercostals and left cervical region. Next, attempts were made from the left upper extremity access to recanalized the occluded stent within Navicross catheter and stiff Glidewire which were unsuccessful. Therefore, attempts were made recanalization from a retrograde approach. From the right upper extremity, wire purchase into the left innominate vein for recanalization purposes was inadequate despite multiple catheter and wire combinations. Therefore, the right common femoral vein was accessed under direct ultrasound visualization with a 21 gauge micropuncture needle. A permanent image was captured and stored in the record. A Wholey wire was inserted level of the superior vena cava and a 6 [place], 55 cm vascular sheath was placed and positioned in the superior vena cava the left innominate vein was then selected with the Wholey wire and a Navicross catheter. The catheter and wire engage the central aspect of the indwelling stent but was unable to be traversed further. Additional recanalization attempts with a stiff glidewire were also unsuccessful. Repeat left innominate venogram demonstrated drainage from what is likely a left external jugular vein via the uncovered, central most indwelling stent in addition to collateral drainage inferiorly. Incidentally noted, there was high-grade focal stenosis of the midportion left innominate vein. Therefore, balloon angioplasty was performed with a 7 mm x 4 mm Athletis balloon. Post angioplasty venogram demonstrated moderately improved patency and antegrade flow. Next, drug coated balloon angioplasty was performed with a 7 mm x 8 cm InPACT DCB with prolonged inflation for 3 minutes. Completion left innominate venogram demonstrated improved patency inflow without evidence of extravasation or other complicating features. The sheaths were removed and hemostasis was achieved with brief manual  compression at each venous access site. Sterile bandages were applied. The patient tolerated the procedure well was transferred to the recovery area under the direct care of the Anesthesia team. IMPRESSION: 1. 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 2. 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 3. Unsuccessful recanalization of the indwelling, chronically occluded left subclavian vein stents. 4. Technically successful plain and drug coated balloon angioplasty of the left innominate vein stenosis with improved patency and flow. Ruthann Cancer, MD [facility]iology Electronically Signed   By: Ruthann Cancer M.D.   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 The patient presents with new onset left upper extremity swelling with imaging evidence of stent occlusion. Recent diagnosis of AML. EXAM: 1. Ultrasound-guided vascular access of the left brachial, right basilic, and right common femoral veins. 2. Left upper extremity and central venogram. 3. Plain balloon angioplasty of left innominate vein. 4. Drug coated balloon angioplasty of left innominate vein. COMPARISON:  02/03/2023 MEDICATIONS: None. ANESTHESIA/SEDATION: The procedure was performed under general anesthesia. FLUOROSCOPY TIME:  One hundred twenty-five mGy CONTRAST:  50 mL Omnipaque 350, intravenous COMPLICATIONS: None immediate. TECHNIQUE: Informed written consent was obtained from the patient after a thorough discussion of the procedural risks, benefits and alternatives. All questions were addressed. Maximal Sterile Barrier Technique was utilized including caps, mask, sterile gowns, sterile gloves, sterile drape, hand hygiene and  skin antiseptic. A timeout  was performed prior to the initiation of the procedure. The bilateral upper extremities and right coronary prepped and draped in standard fashion. Preprocedure ultrasound evaluation of the left upper extremity demonstrated patent brachial vein. Under direct ultrasound visualization, the left brachial vein was accessed with a 21 gauge micropuncture needle. A permanent ultrasound image was captured and stored in the record. A micropuncture sheath was introduced followed by placement of a Wholey wire which was directed toward the axillary vein. Next, a 25 [place], angled destination sheath was inserted and positioned with the tip in the left axillary vein. Gentle hand injection of contrast demonstrated intraluminal position and persistent stent occlusion. Preprocedure ultrasound evaluation of the right upper extremity demonstrated patent basilic vein. Under direct ultrasound visualization, the right basilic vein was accessed with a 21 gauge micropuncture needle. A permanent ultrasound image was captured and stored in the record. A micropuncture sheath was introduced followed by placement of a Wholey wire which was directed toward the axillary vein. Next, a 43 [place], angled destination sheath was inserted and positioned with the tip in the right innominate vein. A rim catheter was then inserted and used to select the left innominate vein. Simultaneous venogram was performed via injection through both access sites. Venogram was consistent for complete, chronic appearing occlusion of the indwelling left subclavian vein stents with multiple mature collateral veins draining via the intercostals and left cervical region. Next, attempts were made from the left upper extremity access to recanalized the occluded stent within Navicross catheter and stiff Glidewire which were unsuccessful. Therefore, attempts were made recanalization from a retrograde approach. From the right upper extremity, wire  purchase into the left innominate vein for recanalization purposes was inadequate despite multiple catheter and wire combinations. Therefore, the right common femoral vein was accessed under direct ultrasound visualization with a 21 gauge micropuncture needle. A permanent image was captured and stored in the record. A Wholey wire was inserted level of the superior vena cava and a 6 [place], 55 cm vascular sheath was placed and positioned in the superior vena cava the left innominate vein was then selected with the Wholey wire and a Navicross catheter. The catheter and wire engage the central aspect of the indwelling stent but was unable to be traversed further. Additional recanalization attempts with a stiff glidewire were also unsuccessful. Repeat left innominate venogram demonstrated drainage from what is likely a left external jugular vein via the uncovered, central most indwelling stent in addition to collateral drainage inferiorly. Incidentally noted, there was high-grade focal stenosis of the midportion left innominate vein. Therefore, balloon angioplasty was performed with a 7 mm x 4 mm Athletis balloon. Post angioplasty venogram demonstrated moderately improved patency and antegrade flow. Next, drug coated balloon angioplasty was performed with a 7 mm x 8 cm InPACT DCB with prolonged inflation for 3 minutes. Completion left innominate venogram demonstrated improved patency inflow without evidence of extravasation or other complicating features. The sheaths were removed and hemostasis was achieved with brief manual compression at each venous access site. Sterile bandages were applied. The patient tolerated the procedure well was transferred to the recovery area under the direct care of the Anesthesia team. IMPRESSION: 1. 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 2. 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 3. Unsuccessful recanalization of the indwelling,  chronically occluded left subclavian vein stents. 4. Technically successful plain and drug coated balloon angioplasty of the left innominate vein stenosis with improved patency and flow. Ruthann Cancer, MD [facility] Electronically Signed   By: Ruthann Cancer M.D.   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 The patient presents with new onset left upper extremity swelling with imaging evidence of stent occlusion. Recent diagnosis of AML. EXAM: 1. Ultrasound-guided vascular access of the left brachial, right basilic, and right common femoral veins. 2. Left upper extremity and central venogram. 3. Plain balloon angioplasty of left innominate vein. 4. Drug coated balloon angioplasty of left innominate vein. COMPARISON:  02/03/2023 MEDICATIONS: None. ANESTHESIA/SEDATION: The procedure was performed under general anesthesia. FLUOROSCOPY TIME:  One hundred twenty-five mGy CONTRAST:  50 mL Omnipaque 350, intravenous COMPLICATIONS: None immediate. TECHNIQUE: Informed written consent was obtained from the patient after a thorough discussion of the procedural risks, benefits and alternatives. All questions were addressed. Maximal Sterile Barrier Technique was utilized including caps, mask, sterile gowns, sterile gloves, sterile drape, hand hygiene and skin antiseptic. A timeout was performed prior to the initiation of the procedure. The bilateral upper extremities and right coronary prepped and draped in standard fashion. Preprocedure ultrasound evaluation of the left upper extremity demonstrated patent brachial vein. Under direct ultrasound visualization, the left brachial vein was accessed with a 21 gauge micropuncture needle. A permanent ultrasound  image was captured and stored in the record. A micropuncture sheath was introduced followed by placement of a Wholey wire which was directed toward the axillary vein. Next, a 40 [place], angled destination sheath was inserted and positioned with the tip in the left axillary vein. Gentle hand injection of contrast demonstrated intraluminal position and persistent stent occlusion. Preprocedure ultrasound evaluation of the right upper extremity demonstrated patent basilic vein. Under direct ultrasound visualization, the right basilic vein was accessed with a 21 gauge micropuncture needle. A permanent ultrasound image was captured and stored in the record. A micropuncture sheath was introduced followed by placement of a Wholey wire which was directed toward the axillary vein. Next, a 84 [place], angled destination sheath was inserted and positioned with the tip in the right innominate vein. A rim catheter was then inserted and used to select the left innominate vein. Simultaneous venogram was performed via injection through both access sites. Venogram was consistent for complete, chronic appearing occlusion of the indwelling left subclavian vein stents with multiple mature collateral veins draining via the intercostals and left cervical region. Next, attempts were made from the left upper extremity access to recanalized the occluded stent within Navicross catheter and stiff Glidewire which were unsuccessful. Therefore, attempts were made recanalization from a retrograde approach. From the right upper extremity, wire purchase into the left innominate vein for recanalization purposes was inadequate despite multiple catheter and wire combinations. Therefore, the right common femoral vein was accessed under direct ultrasound visualization with a 21 gauge micropuncture needle. A permanent image was captured and stored in the record. A Wholey wire was inserted level of the superior vena cava and a 6 [place], 55 cm vascular sheath  was placed and positioned in the superior vena cava the left innominate vein was then selected with the Wholey wire and a Navicross catheter. The catheter and wire engage the central aspect of the indwelling stent but was unable to be traversed further. Additional recanalization attempts with a stiff glidewire were also unsuccessful. Repeat left innominate venogram demonstrated drainage from what is  likely a left external jugular vein via the uncovered, central most indwelling stent in addition to collateral drainage inferiorly. Incidentally noted, there was high-grade focal stenosis of the midportion left innominate vein. Therefore, balloon angioplasty was performed with a 7 mm x 4 mm Athletis balloon. Post angioplasty venogram demonstrated moderately improved patency and antegrade flow. Next, drug coated balloon angioplasty was performed with a 7 mm x 8 cm InPACT DCB with prolonged inflation for 3 minutes. Completion left innominate venogram demonstrated improved patency inflow without evidence of extravasation or other complicating features. The sheaths were removed and hemostasis was achieved with brief manual compression at each venous access site. Sterile bandages were applied. The patient tolerated the procedure well was transferred to the recovery area under the direct care of the Anesthesia team. IMPRESSION: 1. 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 2. 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 3. Unsuccessful recanalization of the indwelling, chronically occluded left subclavian vein stents. 4. Technically successful plain and drug coated balloon angioplasty of the left innominate vein stenosis with improved patency and flow. Ruthann Cancer, MD [facility] Radiology Electronically Signed   By: Ruthann Cancer M.D.   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 The patient presents with new onset left upper extremity swelling with imaging evidence of stent occlusion. Recent diagnosis of AML. EXAM: 1. Ultrasound-guided vascular access of the left brachial, right basilic, and right common femoral veins. 2. Left upper extremity and central venogram. 3. Plain balloon angioplasty of left innominate vein. 4. Drug coated balloon angioplasty of left innominate vein. COMPARISON:  02/03/2023 MEDICATIONS: None. ANESTHESIA/SEDATION: The procedure was performed under general anesthesia. FLUOROSCOPY TIME:  One hundred twenty-five mGy CONTRAST:  50 mL Omnipaque 350, intravenous COMPLICATIONS: None immediate. TECHNIQUE: Informed written consent was obtained from the patient after a thorough discussion of the procedural risks, benefits and alternatives. All questions were addressed. Maximal Sterile Barrier Technique was utilized including caps, mask, sterile gowns, sterile gloves, sterile drape, hand hygiene and skin antiseptic. A timeout was performed prior to the initiation of the procedure. The bilateral upper extremities and right coronary prepped and draped in standard fashion. Preprocedure ultrasound evaluation of the left upper extremity demonstrated patent brachial vein. Under direct ultrasound visualization, the left brachial vein was accessed with a 21 gauge micropuncture needle. A permanent ultrasound image was captured and stored in the record. A micropuncture sheath was introduced followed by placement of a Wholey wire which was directed toward the axillary vein. Next, a 51 [place], angled destination sheath was inserted and positioned with the tip in the left axillary vein. Gentle hand injection of contrast demonstrated intraluminal position and persistent stent occlusion.  Preprocedure ultrasound evaluation of the right upper extremity demonstrated patent basilic vein. Under direct ultrasound visualization, the right basilic vein was accessed with a 21 gauge micropuncture needle. A permanent ultrasound image was captured and stored in the record. A micropuncture sheath was introduced followed by placement of a Wholey wire which was directed toward the axillary vein. Next, a 75 [place], angled destination sheath was inserted and positioned with the tip in the right innominate vein. A rim catheter was then inserted and used to select the left innominate vein. Simultaneous venogram was performed via injection  through both access sites. Venogram was consistent for complete, chronic appearing occlusion of the indwelling left subclavian vein stents with multiple mature collateral veins draining via the intercostals and left cervical region. Next, attempts were made from the left upper extremity access to recanalized the occluded stent within Navicross catheter and stiff Glidewire which were unsuccessful. Therefore, attempts were made recanalization from a retrograde approach. From the right upper extremity, wire purchase into the left innominate vein for recanalization purposes was inadequate despite multiple catheter and wire combinations. Therefore, the right common femoral vein was accessed under direct ultrasound visualization with a 21 gauge micropuncture needle. A permanent image was captured and stored in the record. A Wholey wire was inserted level of the superior vena cava and a 6 [place], 55 cm vascular sheath was placed and positioned in the superior vena cava the left innominate vein was then selected with the Wholey wire and a Navicross catheter. The catheter and wire engage the central aspect of the indwelling stent but was unable to be traversed further. Additional recanalization attempts with a stiff glidewire were also unsuccessful. Repeat left innominate venogram demonstrated  drainage from what is likely a left external jugular vein via the uncovered, central most indwelling stent in addition to collateral drainage inferiorly. Incidentally noted, there was high-grade focal stenosis of the midportion left innominate vein. Therefore, balloon angioplasty was performed with a 7 mm x 4 mm Athletis balloon. Post angioplasty venogram demonstrated moderately improved patency and antegrade flow. Next, drug coated balloon angioplasty was performed with a 7 mm x 8 cm InPACT DCB with prolonged inflation for 3 minutes. Completion left innominate venogram demonstrated improved patency inflow without evidence of extravasation or other complicating features. The sheaths were removed and hemostasis was achieved with brief manual compression at each venous access site. Sterile bandages were applied. The patient tolerated the procedure well was transferred to the recovery area under the direct care of the Anesthesia team. IMPRESSION: 1. 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 2. 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 3. Unsuccessful recanalization of the indwelling, chronically occluded left subclavian vein stents. 4. Technically successful plain and drug coated balloon angioplasty of the left innominate vein stenosis with improved patency and flow. Ruthann Cancer, MD [facility] Radiology Electronically Signed   By: Ruthann Cancer M.D.   On: 02/07/2023 11:18    Labs:  CBC: Recent Labs    02/05/23 0700 02/06/23 0417 02/07/23 0456 02/08/23 0525  WBC 1.2* 1.3* 1.2* 1.1*  HGB 8.8* 9.3* 8.6* 7.8*  HCT 23.9* 26.6* 23.6* 22.2*  PLT 29* 32* 28* 28*    COAGS: Recent Labs    02/02/23 1248 02/02/23 2255 02/03/23 0733 02/04/23 0153  INR 1.0  --   --   --   APTT  --  52* 86* 87*    BMP: Recent Labs    02/04/23 0153 02/05/23 0700 02/06/23 0417 02/07/23 0456  NA 135 135 135 136  K 3.6 3.6  4.5 3.8  CL 101 102 101 102  CO2 25 24 25 22   GLUCOSE 103* 97 96 103*  BUN 9 6* 8 8  CALCIUM 8.5* 8.5* 9.1 8.7*  CREATININE 0.73 0.72 0.77 0.74  GFRNONAA >60 >60 >60 >60    LIVER FUNCTION TESTS: Recent Labs    02/04/23 0153 02/05/23 0700 02/06/23 0417 02/07/23 0456  BILITOT 0.8 0.7 0.8 0.8  AST 18 20 38 29  ALT 17 18 25  30  ALKPHOS 61 61 67 66  PROT 6.3* 5.6* 6.0* 5.6*  ALBUMIN 3.6 3.2* 3.3* 3.2*    Assessment and Plan:  Left subclavian vein stent occlusion Recanalization in IR 2/19 Pt is being transferred to [place] today  Order in place for OP IR clinic visit 1 month  Electronically Signed: Lavonia Drafts, PA-C 02/08/2023, 10:14 AM   I spent a total of 15 Minutes at the the patient's bedside AND on the patient's [facility] floor or unit, greater than 50% of which was counseling/coordinating care for Left subclavian vein recanalization

## 2023-02-08 NOTE — Care Management Important Message (Signed)
Important Message  Patient Details  Name: [name] MRN: [id number] Date of Birth: [birth year]   Medicare Important Message Given:  Yes     Sylvanus, Telford 02/08/2023, 11:48 AM

## 2023-02-08 NOTE — TOC Transition Note (Signed)
Transition of Care (TOC) - CM/SW Discharge Note Marvetta Gibbons RN, BSN Transitions of Care Unit 4E- RN Case Manager See Treatment Team for direct phone #   Patient Details  Name: [name] MRN: [id number] Date of Birth: January 15, 1946  Transition of Care [facility]) CM/SW Contact:  Dawayne Patricia, RN Phone Number: 02/08/2023, 9:53 AM   Clinical Narrative:    Pt to transfer acute to acute to [facility]. Per bedside RN- unit has received bed assignment from [facility]- and pt waiting transport arrangements.   Unit to coordinate EMS transport   Final next level of care: Acute to Acute Transfer Barriers to Discharge: Barriers Resolved   Patient Goals and CMS Choice   Choice offered to / list presented to : NA  Discharge Placement                 Acute transfer to [facility]        Discharge Plan and Services Additional resources added to the After Visit Summary for   In-house Referral: NA Discharge Planning Services: NA Post Acute Care Choice: NA          DME Arranged: N/A DME Agency: NA       HH Arranged: NA HH Agency: NA        Social Determinants of Health (SDOH) Interventions SDOH Screenings   Food Insecurity: No Food Insecurity (02/02/2023)  Housing: Low Risk  (02/02/2023)  Transportation Needs: No Transportation Needs (02/02/2023)  Utilities: Not At Risk (02/02/2023)  Tobacco Use: Medium Risk (02/08/2023)     Readmission Risk Interventions    02/08/2023    9:53 AM  Readmission Risk Prevention Plan  Transportation Screening Complete  PCP or Specialist Appt within 5-7 Days Not Complete  Not Complete comments pt to transfer acute to acute to [place] Complete  Medication Review (RN CM) Complete

## 2023-02-11 ENCOUNTER — Other Ambulatory Visit: Payer: Self-pay | Admitting: Physician Assistant

## 2023-02-11 ENCOUNTER — Ambulatory Visit (HOSPITAL_BASED_OUTPATIENT_CLINIC_OR_DEPARTMENT_OTHER): Payer: Medicare Other | Admitting: Cardiology

## 2023-02-14 ENCOUNTER — Encounter (HOSPITAL_COMMUNITY): Payer: Self-pay

## 2023-02-18 ENCOUNTER — Encounter (HOSPITAL_COMMUNITY): Payer: Self-pay

## 2023-03-21 ENCOUNTER — Other Ambulatory Visit: Payer: Medicare Other

## 2023-03-21 DIAGNOSIS — Z515 Encounter for palliative care: Secondary | ICD-10-CM

## 2023-03-21 NOTE — Progress Notes (Signed)
COMMUNITY PALLIATIVE CARE SW NOTE  PATIENT NAME: [name] DOB: February 15, 1946 MRN: [id number]  PRIMARY CARE PROVIDER: Jodene Nam, MD  RESPONSIBLE PARTY:  Acct ID - Guarantor Home Phone Work Phone Relationship Acct Type  [id number] JACAMERON, [phone]  Self P/F     [address], [place], [place] [address]   Initial Palliative Care Encounter/Clinical Social Work  [employer] SW completed a telephonic encounter with patient's wife. SW provided education to her regarding [facility] palliative care program, role in patient's care, visit frequency and contact information. She provided an initial verbal consent to services.  Patient was discharged from the hospital on 3/28. He was discharged with PT, OT and nurse through [facility]. He is also on 1 L of o2 at night and PRN. Patient condition is stable overall as he continues with his second round of chemotherapy. His wife advised that they have several appointments coming up, but was open to scheduling an appointment a few weeks out. T  This patient is scheduled with 04/19/23 @ 12:30 pm.    Social History   Tobacco Use   Smoking status: Former    Packs/day: 2.00    Years: 40.00    Additional pack years: 0.00    Total pack years: 80.00    Types: Cigarettes    Quit date: 2004    Years since quitting: 20.2   Smokeless tobacco: Never  Substance Use Topics   Alcohol use: Yes    Alcohol/week: 42.0 standard drinks of alcohol    Types: 42 Glasses of wine per week    Comment: night- Vodka 4 to five drinks a night    CODE STATUS: Full Code ADVANCED DIRECTIVES: No MOST FORM COMPLETE:  No HOSPICE EDUCATION PROVIDED: No  Duration of visit and documentation: 30 minutes  [employer], LCSW

## 2023-03-23 ENCOUNTER — Ambulatory Visit (INDEPENDENT_AMBULATORY_CARE_PROVIDER_SITE_OTHER): Payer: Medicare Other | Admitting: Podiatry

## 2023-03-23 ENCOUNTER — Encounter: Payer: Self-pay | Admitting: Podiatry

## 2023-03-23 DIAGNOSIS — M79675 Pain in left toe(s): Secondary | ICD-10-CM

## 2023-03-23 DIAGNOSIS — B351 Tinea unguium: Secondary | ICD-10-CM | POA: Diagnosis not present

## 2023-03-23 DIAGNOSIS — D689 Coagulation defect, unspecified: Secondary | ICD-10-CM

## 2023-03-23 DIAGNOSIS — M79674 Pain in right toe(s): Secondary | ICD-10-CM

## 2023-03-23 NOTE — Progress Notes (Signed)
This patient returns to my office for at risk foot care.  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  The ulcer right foot has resolved.  This patient is unable to cut nails himself since the patient cannot reach his nails.These nails are painful walking and wearing shoes. He requests that the nails are only filed with dremel tool and not cut with the nipper since he is  receiving leukemia treatments.  This patient presents for at risk foot care today.     General Appearance  Alert, conversant and in no acute stress.  Vascular  Dorsalis pedis and posterior tibial  pulses are palpable  bilaterally.  Capillary return is within normal limits  bilaterally. Temperature is within normal limits  bilaterally.  Neurologic  Senn-Weinstein monofilament wire test within normal limits  bilaterally. Muscle power within normal limits bilaterally.  Nails Thick disfigured discolored nails with subungual debris  hallux nails  bilaterally. No evidence of bacterial infection or drainage bilaterally.  Orthopedic  No limitations of motion  feet .  No crepitus or effusions noted.  No bony pathology or digital deformities noted.    Skin  normotropic skin with no porokeratosis noted bilaterally.  No signs of infections or ulcers noted.     Onychomycosis  Pain in right toes  Pain in left toes  Consent was obtained for treatment procedures.   Mechanical debridement of nails 1-5  bilaterally performed and  filed with dremel without incident.    Return office visit    10 weeks                 Told patient to return for periodic foot care and evaluation due to potential at risk complications.   Gardiner Barefoot DPM

## 2023-04-19 ENCOUNTER — Other Ambulatory Visit: Payer: Medicare Other

## 2023-05-03 ENCOUNTER — Other Ambulatory Visit: Payer: Medicare Other

## 2023-05-03 DIAGNOSIS — Z515 Encounter for palliative care: Secondary | ICD-10-CM

## 2023-05-03 NOTE — Progress Notes (Signed)
COMMUNITY PALLIATIVE CARE SW NOTE  PATIENT NAME: DAIZON PROPES DOB: Aug 25, 1946 MRN: [id number]  PRIMARY CARE PROVIDER: Orpha Bur, MD  RESPONSIBLE PARTY:  Acct ID - Guarantor Home Phone Work Phone Relationship Acct Type  [id number] NOLLAN, GRAIG* [phone]  Self P/F     [address], [place], [place] [address]   Palliative Care Follow-up Telephonic Encounter PC SW completed a telephonic encounter with patient's wife. SW reiterated education to her regarding [facility] palliative care program, role in patient's care, visit frequency and contact information. She advised that she wanted to schedule an appointment now that patient is out of the hospital. She report that patient is sleeping more overall. His appetite has declined and he has lost 10 lbs. Patient is ambulating around his home but is experiencing fatigue and weakness.SW scheduled a follow-up visit with palliative RN for  05/13/23 @ 1:30 pm.      Social History   Tobacco Use   Smoking status: Former    Packs/day: 2.00    Years: 40.00    Additional pack years: 0.00    Total pack years: 80.00    Types: Cigarettes    Quit date: 2004    Years since quitting: 20.3   Smokeless tobacco: Never  Substance Use Topics   Alcohol use: Yes    Alcohol/week: 42.0 standard drinks of alcohol    Types: 42 Glasses of wine per week    Comment: night- Vodka 4 to five drinks a night    CODE STATUS: Full Code ADVANCED DIRECTIVES: No MOST FORM COMPLETE: No HOSPICE EDUCATION PROVIDED: No  Duration of encounter and documentation: 30 minutes  [employer], LCSW

## 2023-05-13 ENCOUNTER — Other Ambulatory Visit: Payer: Medicare Other

## 2023-06-01 ENCOUNTER — Ambulatory Visit (INDEPENDENT_AMBULATORY_CARE_PROVIDER_SITE_OTHER): Payer: Medicare Other | Admitting: Podiatry

## 2023-06-01 ENCOUNTER — Encounter: Payer: Self-pay | Admitting: Podiatry

## 2023-06-01 DIAGNOSIS — M79674 Pain in right toe(s): Secondary | ICD-10-CM

## 2023-06-01 DIAGNOSIS — D689 Coagulation defect, unspecified: Secondary | ICD-10-CM | POA: Diagnosis not present

## 2023-06-01 DIAGNOSIS — M79675 Pain in left toe(s): Secondary | ICD-10-CM

## 2023-06-01 DIAGNOSIS — B351 Tinea unguium: Secondary | ICD-10-CM

## 2023-06-01 DIAGNOSIS — I739 Peripheral vascular disease, unspecified: Secondary | ICD-10-CM

## 2023-06-01 NOTE — Progress Notes (Signed)
This patient returns to my office for at risk foot care.  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  The ulcer right foot has resolved.  This patient is unable to cut nails himself since the patient cannot reach his nails.These nails are painful walking and wearing shoes. He requests that the nails are only filed with dremel tool and not cut with the nipper since he is  receiving leukemia treatments.  This patient presents for at risk foot care today.     General Appearance  Alert, conversant and in no acute stress.  Vascular  Dorsalis pedis and posterior tibial  pulses are palpable  bilaterally.  Capillary return is within normal limits  bilaterally. Temperature is within normal limits  bilaterally.  Neurologic  Senn-Weinstein monofilament wire test within normal limits  bilaterally. Muscle power within normal limits bilaterally.  Nails Thick disfigured discolored nails with subungual debris  hallux nails  bilaterally. No evidence of bacterial infection or drainage bilaterally.  Orthopedic  No limitations of motion  feet .  No crepitus or effusions noted.  No bony pathology or digital deformities noted.    Skin  normotropic skin with no porokeratosis noted bilaterally.  No signs of infections or ulcers noted.     Onychomycosis  Pain in right toes  Pain in left toes  Consent was obtained for treatment procedures.   Mechanical debridement of nails 1-5  bilaterally performed and  filed with dremel without incident. Healing skin lesion right hallux distally.   Return office visit    10 weeks                 Told patient to return for periodic foot care and evaluation due to potential at risk complications.   Helane Gunther DPM

## 2023-06-28 ENCOUNTER — Other Ambulatory Visit: Payer: Medicare Other

## 2023-08-10 ENCOUNTER — Encounter: Payer: Self-pay | Admitting: Podiatry

## 2023-08-10 ENCOUNTER — Ambulatory Visit: Payer: Medicare Other | Admitting: Podiatry

## 2023-08-10 DIAGNOSIS — S90222A Contusion of left lesser toe(s) with damage to nail, initial encounter: Secondary | ICD-10-CM

## 2023-08-10 DIAGNOSIS — M79675 Pain in left toe(s): Secondary | ICD-10-CM | POA: Diagnosis not present

## 2023-08-10 DIAGNOSIS — B351 Tinea unguium: Secondary | ICD-10-CM | POA: Diagnosis not present

## 2023-08-10 DIAGNOSIS — M79674 Pain in right toe(s): Secondary | ICD-10-CM | POA: Diagnosis not present

## 2023-08-10 DIAGNOSIS — D689 Coagulation defect, unspecified: Secondary | ICD-10-CM | POA: Diagnosis not present

## 2023-08-10 NOTE — Progress Notes (Signed)
This patient returns to my office for at risk foot care.  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  This patient is unable to cut nails himself since the patient cannot reach his nails.These nails are painful walking and wearing shoes. He requests that the nails are only filed with dremel tool and not cut with the nipper since he is  receiving leukemia treatments. He has also injured his fourth toe right foot. This patient presents for at risk foot care today.     General Appearance  Alert, conversant and in no acute stress.  Vascular  Dorsalis pedis and posterior tibial  pulses are palpable  bilaterally.  Capillary return is within normal limits  bilaterally. Temperature is within normal limits  bilaterally.  Neurologic  Senn-Weinstein monofilament wire test within normal limits  bilaterally. Muscle power within normal limits bilaterally.  Nails Thick disfigured discolored nails with subungual debris  hallux nails  bilaterally. No evidence of bacterial infection or drainage bilaterally.  Orthopedic  No limitations of motion  feet .  No crepitus or effusions noted.  No bony pathology or digital deformities noted.    Skin  normotropic skin with no porokeratosis noted bilaterally.  No signs of infections or ulcers noted.     Onychomycosis  Pain in right toes  Pain in left toes Toenail contusion right foot.  Consent was obtained for treatment procedures.   Mechanical debridement of nails 1-5  bilaterally performed and  filed with dremel without incident. Incision and evacuation of subungual hematoma from fourth toenail right foot.   Return office visit    10 weeks                 Told patient to return for periodic foot care and evaluation due to potential at risk complications.   Helane Gunther DPM

## 2023-08-23 ENCOUNTER — Encounter: Payer: Self-pay | Admitting: Podiatry

## 2023-08-25 ENCOUNTER — Encounter: Payer: Self-pay | Admitting: Podiatry

## 2023-08-25 ENCOUNTER — Telehealth: Payer: Self-pay | Admitting: Urology

## 2023-08-25 ENCOUNTER — Ambulatory Visit (INDEPENDENT_AMBULATORY_CARE_PROVIDER_SITE_OTHER): Payer: Medicare Other | Admitting: Podiatry

## 2023-08-25 DIAGNOSIS — S90222A Contusion of left lesser toe(s) with damage to nail, initial encounter: Secondary | ICD-10-CM

## 2023-08-25 DIAGNOSIS — S90221A Contusion of right lesser toe(s) with damage to nail, initial encounter: Secondary | ICD-10-CM

## 2023-08-25 NOTE — Progress Notes (Signed)
This patient returns to my office for at risk foot care.  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   He has previously  injured his fourth toe right foot. He presents to the office with his wife stating the nail on his fourth toe right foot is falling off.This patient presents for at risk foot care today.     General Appearance  Alert, conversant and in no acute stress.  Vascular  Dorsalis pedis and posterior tibial  pulses are palpable  bilaterally.  Capillary return is within normal limits  bilaterally. Temperature is within normal limits  bilaterally.  Neurologic  Senn-Weinstein monofilament wire test within normal limits  bilaterally. Muscle power within normal limits bilaterally.  Nails Thick disfigured discolored nails with subungual debris  hallux nails  bilaterally. No evidence of bacterial infection or drainage bilaterally. Fourth toenail right foot is unattached.  Orthopedic  No limitations of motion  feet .  No crepitus or effusions noted.  No bony pathology or digital deformities noted.    Skin  normotropic skin with no porokeratosis noted bilaterally.  No signs of infections or ulcers noted.      Toenail contusion right foot.  Consent was obtained for treatment procedures.   Removal fourth toenail right foot.   Return office visit    10 weeks                 Told patient to return for periodic foot care and evaluation due to potential at risk complications.   Helane Gunther DPM

## 2023-08-25 NOTE — Telephone Encounter (Signed)
SPOKE WITH PT AND HIS WIFE, STATED THAT DR. Stacie Acres WANTS TO SEE HIM THIS WEEK TO LOOK AT THAT NAIL, WIFE AGREES THAT PT NEEDS TO BE SEEN BUT PT IS NOT WANTING TO COME IN. WIFE STATED THAT SHE WILL TALK TO HIM AND TRY TO GET HIM TO COME SEE [place] THIS WEEK. TOLD HER TO CALL BACK AND MAKE AN APPT OR JUST SHOW UP AND WE WILL SEE HIM PER DR. Stacie Acres.

## 2023-08-27 ENCOUNTER — Encounter: Payer: Self-pay | Admitting: Internal Medicine

## 2023-09-20 ENCOUNTER — Other Ambulatory Visit: Payer: Medicare Other

## 2023-09-22 ENCOUNTER — Ambulatory Visit: Payer: Medicare Other | Admitting: Internal Medicine

## 2023-10-19 ENCOUNTER — Ambulatory Visit: Payer: Medicare Other | Admitting: Podiatry

## 2023-10-21 DEATH — deceased
